# Patient Record
Sex: Male | Born: 1950 | Hispanic: No | Marital: Single | Smoking: Light tobacco smoker
Health system: Southern US, Community
[De-identification: ages and names within clinical notes are randomized; demographics above are authoritative.]

## PROBLEM LIST (undated history)

## (undated) DIAGNOSIS — I255 Ischemic cardiomyopathy: Secondary | ICD-10-CM

## (undated) DIAGNOSIS — I10 Essential (primary) hypertension: Secondary | ICD-10-CM

## (undated) DIAGNOSIS — E119 Type 2 diabetes mellitus without complications: Secondary | ICD-10-CM

## (undated) DIAGNOSIS — E039 Hypothyroidism, unspecified: Secondary | ICD-10-CM

## (undated) DIAGNOSIS — G4733 Obstructive sleep apnea (adult) (pediatric): Principal | ICD-10-CM

## (undated) DIAGNOSIS — F32A Depression, unspecified: Secondary | ICD-10-CM

## (undated) DIAGNOSIS — I2510 Atherosclerotic heart disease of native coronary artery without angina pectoris: Secondary | ICD-10-CM

## (undated) DIAGNOSIS — F329 Major depressive disorder, single episode, unspecified: Secondary | ICD-10-CM

## (undated) HISTORY — DX: Major depressive disorder, single episode, unspecified: F32.9

## (undated) HISTORY — DX: Ischemic cardiomyopathy: I25.5

## (undated) HISTORY — DX: Hypothyroidism, unspecified: E03.9

## (undated) HISTORY — DX: Obstructive sleep apnea (adult) (pediatric): G47.33

## (undated) HISTORY — DX: Depression, unspecified: F32.A

## (undated) HISTORY — DX: Type 2 diabetes mellitus without complications: E11.9

## (undated) HISTORY — PX: OTHER SURGICAL HISTORY: SHX169

## (undated) HISTORY — DX: Essential (primary) hypertension: I10

## (undated) HISTORY — PX: CORONARY STENT PLACEMENT: SHX1402

## (undated) HISTORY — DX: Atherosclerotic heart disease of native coronary artery without angina pectoris: I25.10

---

## 2013-01-25 ENCOUNTER — Ambulatory Visit: Payer: No Typology Code available for payment source | Attending: Family Medicine | Admitting: Internal Medicine

## 2013-01-25 VITALS — BP 149/85 | HR 82 | Temp 99.1°F | Resp 18 | Ht 68.0 in | Wt 204.0 lb

## 2013-01-25 DIAGNOSIS — F3289 Other specified depressive episodes: Secondary | ICD-10-CM | POA: Insufficient documentation

## 2013-01-25 DIAGNOSIS — M545 Low back pain, unspecified: Secondary | ICD-10-CM | POA: Insufficient documentation

## 2013-01-25 DIAGNOSIS — F329 Major depressive disorder, single episode, unspecified: Secondary | ICD-10-CM

## 2013-01-25 DIAGNOSIS — M549 Dorsalgia, unspecified: Secondary | ICD-10-CM | POA: Insufficient documentation

## 2013-01-25 DIAGNOSIS — G8929 Other chronic pain: Secondary | ICD-10-CM | POA: Insufficient documentation

## 2013-01-25 LAB — LIPID PANEL
Cholesterol: 175 mg/dL (ref 0–200)
Total CHOL/HDL Ratio: 5 Ratio
Triglycerides: 232 mg/dL — ABNORMAL HIGH (ref <150)
VLDL: 46 mg/dL — ABNORMAL HIGH (ref 0–40)

## 2013-01-25 LAB — CBC WITH DIFFERENTIAL/PLATELET
Eosinophils Absolute: 0.5 10*3/uL (ref 0.0–0.7)
Eosinophils Relative: 5 % (ref 0–5)
Lymphs Abs: 2 10*3/uL (ref 0.7–4.0)
MCH: 21.8 pg — ABNORMAL LOW (ref 26.0–34.0)
MCV: 64.6 fL — ABNORMAL LOW (ref 78.0–100.0)
Monocytes Relative: 9 % (ref 3–12)
Platelets: 197 10*3/uL (ref 150–400)
RBC: 6.1 MIL/uL — ABNORMAL HIGH (ref 4.22–5.81)

## 2013-01-25 LAB — COMPREHENSIVE METABOLIC PANEL
CO2: 21 mEq/L (ref 19–32)
Creat: 0.93 mg/dL (ref 0.50–1.35)
Glucose, Bld: 201 mg/dL — ABNORMAL HIGH (ref 70–99)
Total Bilirubin: 1.1 mg/dL (ref 0.3–1.2)

## 2013-01-25 MED ORDER — DULOXETINE HCL 30 MG PO CPEP: 30.0000 mg | ORAL_CAPSULE | Freq: Every day | ORAL | Status: DC

## 2013-01-25 MED ORDER — DOCUSATE SODIUM 100 MG PO CAPS: 100.0000 mg | ORAL_CAPSULE | Freq: Two times a day (BID) | ORAL | Status: DC

## 2013-01-25 NOTE — Progress Notes (Signed)
  Subjective:    Patient ID: Nicolas Ray, male    DOB: April 23, 1951, 62 y.o.   MRN: [id number]  HPI 62 year old male who presents to [facility] for establishing care. The patient has not been provided in 10 years. He states that he has been unable to work for the last 6 years because of back pain and depression. The patient has never seen anybody for depression. He denies any chest pain any shortness of breath  Past medical history 1.depression  Past surgical history None  Allergies Peanuts No medication allergies  Family history unknown to the patient Social history smokes 5-6 cigarettes a day of, and is divorced 3 daughters who live in the area    Review of Systems As in history of present illness     Objective:   Physical Exam  General: in No Acute distress  2. Psychological: Alert and Oriented , does not make eye contact 3. Head/ENT: Dry Mucous Membranes  Head Non traumatic, neck supple  Poor Dentition  4. SKIN: decreased Skin turgor, Skin clean Dry and intact no rash  5. Heart: Regular rate and rhythm no Murmur, Rub or gallop  6. Lungs: Clear to auscultation bilaterally, no wheezes or crackles  7. Abdomen: Soft,RLQ tenderness , Non distended  8. Lower extremities: no clubbing, cyanosis, or edema  9. Neurologically Grossly intact, moving all 4 extremities equally  10. MSK: Normal range of motion       Assessment & Plan:  Baseline labs CMP, CBC, hemoglobin A1c, lipid panel  Depression Start the patient on Cymbalta 30 mg a day and follow up in 2 weeks  Chronic back pain the patient has been advised to take Tylenol for this

## 2013-01-25 NOTE — Addendum Note (Signed)
Addended by: Susie Cassette MD, Germain Osgood on: 01/25/2013 10:45 AM   Modules accepted: Orders

## 2013-01-25 NOTE — Patient Instructions (Signed)
Please return in 2 weeks for a followup

## 2013-01-25 NOTE — Progress Notes (Signed)
Patient presents for physical and wants his thyroid to be tested.

## 2013-02-07 ENCOUNTER — Ambulatory Visit: Payer: No Typology Code available for payment source

## 2013-02-08 ENCOUNTER — Encounter: Payer: Self-pay | Admitting: Family Medicine

## 2013-02-08 ENCOUNTER — Ambulatory Visit: Payer: No Typology Code available for payment source | Attending: Family Medicine | Admitting: Family Medicine

## 2013-02-08 VITALS — BP 161/92 | HR 94 | Temp 97.9°F | Ht 68.0 in | Wt 206.0 lb

## 2013-02-08 DIAGNOSIS — E039 Hypothyroidism, unspecified: Secondary | ICD-10-CM

## 2013-02-08 DIAGNOSIS — IMO0001 Reserved for inherently not codable concepts without codable children: Secondary | ICD-10-CM

## 2013-02-08 DIAGNOSIS — I10 Essential (primary) hypertension: Secondary | ICD-10-CM

## 2013-02-08 HISTORY — DX: Essential (primary) hypertension: I10

## 2013-02-08 MED ORDER — LEVOTHYROXINE SODIUM 25 MCG PO TABS: 25.0000 ug | ORAL_TABLET | Freq: Every day | ORAL | Status: DC

## 2013-02-08 MED ORDER — METFORMIN HCL ER 500 MG PO TB24: ORAL_TABLET | ORAL | Status: DC

## 2013-02-08 MED ORDER — LISINOPRIL 5 MG PO TABS: 5.0000 mg | ORAL_TABLET | Freq: Every day | ORAL | Status: DC

## 2013-02-08 NOTE — Progress Notes (Signed)
Patient ID: Nicolas Ray, male   DOB: Nov 23, 1950, 62 y.o.   MRN: [id number]  CC: follow up labs   HPI: Pt presenting today to follow up for labs.  He has reported that he is feeling well. No complaints at this time.  He is requesting referral for eye and dental.    No Known Allergies Past Medical History  Diagnosis Date  . Unspecified essential hypertension 02/08/2013   Current Outpatient Prescriptions on File Prior to Visit  Medication Sig Dispense Refill  . docusate sodium (COLACE) 100 MG capsule Take 1 capsule (100 mg total) by mouth 2 (two) times daily.  10 capsule  0  . DULoxetine (CYMBALTA) 30 MG capsule Take 1 capsule (30 mg total) by mouth daily.  30 capsule  3   No current facility-administered medications on file prior to visit.   Family History  Problem Relation Age of Onset  . Heart disease Mother   . Cancer Father    History   Social History  . Marital Status: Divorced    Spouse Name: N/A    Number of Children: N/A  . Years of Education: N/A   Occupational History  . Not on file.   Social History Main Topics  . Smoking status: Light Tobacco Smoker -- 0.25 packs/day    Types: Cigarettes  . Smokeless tobacco: Not on file  . Alcohol Use: No  . Drug Use: No  . Sexually Active: Not on file   Other Topics Concern  . Not on file   Social History Narrative  . No narrative on file    Review of Systems  Constitutional: Negative for fever, chills, diaphoresis, activity change, appetite change and fatigue.  HENT: Negative for ear pain, nosebleeds, congestion, facial swelling, rhinorrhea, neck pain, neck stiffness and ear discharge.   Eyes: Negative for pain, discharge, redness, itching and visual disturbance.  Respiratory: Negative for cough, choking, chest tightness, shortness of breath, wheezing and stridor.   Cardiovascular: Negative for chest pain, palpitations and leg swelling.  Gastrointestinal: Negative for abdominal distention.  Genitourinary: Negative for  dysuria, urgency, frequency, hematuria, flank pain, decreased urine volume, difficulty urinating and dyspareunia.  Musculoskeletal: Negative for back pain, joint swelling, arthralgias and gait problem.  Neurological: Negative for dizziness, tremors, seizures, syncope, facial asymmetry, speech difficulty, weakness, light-headedness, numbness and headaches.  Hematological: Negative for adenopathy. Does not bruise/bleed easily.  Psychiatric/Behavioral: Negative for hallucinations, behavioral problems, confusion, dysphoric mood, decreased concentration and agitation.    Objective:   Filed Vitals:   02/08/13 1048  BP: 161/92  Pulse: 94  Temp: 97.9 F (36.6 C)    Physical Exam  Constitutional: Appears well-developed and well-nourished. No distress.  HENT: Normocephalic. External right and left ear normal. Oropharynx is clear and moist.  Eyes: Conjunctivae and EOM are normal. PERRLA, no scleral icterus.  Neck: Normal ROM. Neck supple. No JVD. No tracheal deviation. No thyromegaly.  CVS: RRR, S1/S2 +, no murmurs, no gallops, no carotid bruit.  Pulmonary: Effort and breath sounds normal, no stridor, rhonchi, wheezes, rales.  Abdominal: Soft. BS +,  no distension, tenderness, rebound or guarding.  Musculoskeletal: Normal range of motion. No edema and no tenderness.  Lymphadenopathy: No lymphadenopathy noted, cervical, inguinal. Neuro: Alert. Normal reflexes, muscle tone coordination. No cranial nerve deficit. Skin: Skin is warm and dry. No rash noted. Not diaphoretic. No erythema. No pallor.  Psychiatric: Normal mood and affect. Behavior, judgment, thought content normal.   Lab Results  Component Value Date   WBC 8.4 01/25/2013  HGB 13.3 01/25/2013   HCT 39.4 01/25/2013   MCV 64.6* 01/25/2013   PLT 197 01/25/2013   Lab Results  Component Value Date   CREATININE 0.93 01/25/2013   BUN 17 01/25/2013   NA 134* 01/25/2013   K 4.1 01/25/2013   CL 103 01/25/2013   CO2 21 01/25/2013    Lab  Results  Component Value Date   HGBA1C 8.6* 01/25/2013   Lipid Panel     Component Value Date/Time   CHOL 175 01/25/2013 1039   TRIG 232* 01/25/2013 1039   HDL 35* 01/25/2013 1039   CHOLHDL 5.0 01/25/2013 1039   VLDL 46* 01/25/2013 1039   LDLCALC 94 01/25/2013 1039       Assessment and plan:   Patient Active Problem List   Diagnosis Date Noted  . Unspecified hypothyroidism 02/08/2013  . Type II or unspecified type diabetes mellitus without mention of complication, uncontrolled 02/08/2013  . Unspecified essential hypertension 02/08/2013  . Depression 01/25/2013  . Low back pain 01/25/2013   New diagnosis of type 2 diabetes  Refer to diabetes education Strongly advised tobacco cessation Start Glucophage XR 500 mg take 1 po daily with breakfast for 1 week, then titrate to 1 po BIDAC New diagnosis of hypothyroidism Start levothyroxine 25 mcg po daily Start lisinopril 5 mg po daily for blood pressure and renal protection  Follow up in 2 weeks.  Packet given to patient with information about blood glucose monitoring  Pt to go to health department for assistance with medications and assistance with glucose meter and testing supplies.   The patient was given clear instructions to go to ER or return to medical center if symptoms don't improve, worsen or new problems develop.  The patient verbalized understanding.  The patient was told to call to get lab results if they haven't heard anything in the next week.    Rodney Langton, MD, CDE, FAAFP [facility] [place], [place]

## 2013-02-08 NOTE — Patient Instructions (Addendum)
Please scheduled a follow up in 2 weeks.  Smoking Cessation Quitting smoking is important to your health and has many advantages. However, it is not always easy to quit since nicotine is a very addictive drug. Often times, people try 3 times or more before being able to quit. This document explains the best ways for you to prepare to quit smoking. Quitting takes hard work and a lot of effort, but you can do it. ADVANTAGES OF QUITTING SMOKING  You will live longer, feel better, and live better.  Your body will feel the impact of quitting smoking almost immediately.  Within 20 minutes, blood pressure decreases. Your pulse returns to its normal level.  After 8 hours, carbon monoxide levels in the blood return to normal. Your oxygen level increases.  After 24 hours, the chance of having a heart attack starts to decrease. Your breath, hair, and body stop smelling like smoke.  After 48 hours, damaged nerve endings begin to recover. Your sense of taste and smell improve.  After 72 hours, the body is virtually free of nicotine. Your bronchial tubes relax and breathing becomes easier.  After 2 to 12 weeks, lungs can hold more air. Exercise becomes easier and circulation improves.  The risk of having a heart attack, stroke, cancer, or lung disease is greatly reduced.  After 1 year, the risk of coronary heart disease is cut in half.  After 5 years, the risk of stroke falls to the same as a nonsmoker.  After 10 years, the risk of lung cancer is cut in half and the risk of other cancers decreases significantly.  After 15 years, the risk of coronary heart disease drops, usually to the level of a nonsmoker.  If you are pregnant, quitting smoking will improve your chances of having a healthy baby.  The people you live with, especially any children, will be healthier.  You will have extra money to spend on things other than cigarettes. QUESTIONS TO THINK ABOUT BEFORE ATTEMPTING TO QUIT You may  want to talk about your answers with your caregiver.  Why do you want to quit?  If you tried to quit in the past, what helped and what did not?  What will be the most difficult situations for you after you quit? How will you plan to handle them?  Who can help you through the tough times? Your family? Friends? A caregiver?  What pleasures do you get from smoking? What ways can you still get pleasure if you quit? Here are some questions to ask your caregiver:  How can you help me to be successful at quitting?  What medicine do you think would be best for me and how should I take it?  What should I do if I need more help?  What is smoking withdrawal like? How can I get information on withdrawal? GET READY  Set a quit date.  Change your environment by getting rid of all cigarettes, ashtrays, matches, and lighters in your home, car, or work. Do not let people smoke in your home.  Review your past attempts to quit. Think about what worked and what did not. GET SUPPORT AND ENCOURAGEMENT You have a better chance of being successful if you have help. You can get support in many ways.  Tell your family, friends, and co-workers that you are going to quit and need their support. Ask them not to smoke around you.  Get individual, group, or telephone counseling and support. Programs are available at local hospitals  and health centers. Call your local health department for information about programs in your area.  Spiritual beliefs and practices may help some smokers quit.  Download a "quit meter" on your computer to keep track of quit statistics, such as how long you have gone without smoking, cigarettes not smoked, and money saved.  Get a self-help book about quitting smoking and staying off of tobacco. LEARN NEW SKILLS AND BEHAVIORS  Distract yourself from urges to smoke. Talk to someone, go for a walk, or occupy your time with a task.  Change your normal routine. Take a different  route to work. Drink tea instead of coffee. Eat breakfast in a different place.  Reduce your stress. Take a hot bath, exercise, or read a book.  Plan something enjoyable to do every day. Reward yourself for not smoking.  Explore interactive web-based programs that specialize in helping you quit. GET MEDICINE AND USE IT CORRECTLY Medicines can help you stop smoking and decrease the urge to smoke. Combining medicine with the above behavioral methods and support can greatly increase your chances of successfully quitting smoking.  Nicotine replacement therapy helps deliver nicotine to your body without the negative effects and risks of smoking. Nicotine replacement therapy includes nicotine gum, lozenges, inhalers, nasal sprays, and skin patches. Some may be available over-the-counter and others require a prescription.  Antidepressant medicine helps people abstain from smoking, but how this works is unknown. This medicine is available by prescription.  Nicotinic receptor partial agonist medicine simulates the effect of nicotine in your brain. This medicine is available by prescription. Ask your caregiver for advice about which medicines to use and how to use them based on your health history. Your caregiver will tell you what side effects to look out for if you choose to be on a medicine or therapy. Carefully read the information on the package. Do not use any other product containing nicotine while using a nicotine replacement product.  RELAPSE OR DIFFICULT SITUATIONS Most relapses occur within the first 3 months after quitting. Do not be discouraged if you start smoking again. Remember, most people try several times before finally quitting. You may have symptoms of withdrawal because your body is used to nicotine. You may crave cigarettes, be irritable, feel very hungry, cough often, get headaches, or have difficulty concentrating. The withdrawal symptoms are only temporary. They are strongest when  you first quit, but they will go away within 10 14 days. To reduce the chances of relapse, try to:  Avoid drinking alcohol. Drinking lowers your chances of successfully quitting.  Reduce the amount of caffeine you consume. Once you quit smoking, the amount of caffeine in your body increases and can give you symptoms, such as a rapid heartbeat, sweating, and anxiety.  Avoid smokers because they can make you want to smoke.  Do not let weight gain distract you. Many smokers will gain weight when they quit, usually less than 10 pounds. Eat a healthy diet and stay active. You can always lose the weight gained after you quit.  Find ways to improve your mood other than smoking. FOR MORE INFORMATION  www.smokefree.gov  Document Released: 08/12/2001 Document Revised: 02/17/2012 Document Reviewed: 11/27/2011 [facility] Patient Information 2014 [place], [place]. Smoking Cessation, Tips for Success YOU CAN QUIT SMOKING If you are ready to quit smoking, congratulations! You have chosen to help yourself be healthier. Cigarettes bring nicotine, tar, carbon monoxide, and other irritants into your body. Your lungs, heart, and blood vessels will be able to work better  without these poisons. There are many different ways to quit smoking. Nicotine gum, nicotine patches, a nicotine inhaler, or nicotine nasal spray can help with physical craving. Hypnosis, support groups, and medicines help break the habit of smoking. Here are some tips to help you quit for good.  Throw away all cigarettes.  Clean and remove all ashtrays from your home, work, and car.  On a card, write down your reasons for quitting. Carry the card with you and read it when you get the urge to smoke.  Cleanse your body of nicotine. Drink enough water and fluids to keep your urine clear or pale yellow. Do this after quitting to flush the nicotine from your body.  Learn to predict your moods. Do not let a bad situation be your excuse to have a  cigarette. Some situations in your life might tempt you into wanting a cigarette.  Never have "just one" cigarette. It leads to wanting another and another. Remind yourself of your decision to quit.  Change habits associated with smoking. If you smoked while driving or when feeling stressed, try other activities to replace smoking. Stand up when drinking your coffee. Brush your teeth after eating. Sit in a different chair when you read the paper. Avoid alcohol while trying to quit, and try to drink fewer caffeinated beverages. Alcohol and caffeine may urge you to smoke.  Avoid foods and drinks that can trigger a desire to smoke, such as sugary or spicy foods and alcohol.  Ask people who smoke not to smoke around you.  Have something planned to do right after eating or having a cup of coffee. Take a walk or exercise to perk you up. This will help to keep you from overeating.  Try a relaxation exercise to calm you down and decrease your stress. Remember, you may be tense and nervous for the first 2 weeks after you quit, but this will pass.  Find new activities to keep your hands busy. Play with a pen, coin, or rubber band. Doodle or draw things on paper.  Brush your teeth right after eating. This will help cut down on the craving for the taste of tobacco after meals. You can try mouthwash, too.  Use oral substitutes, such as lemon drops, carrots, a cinnamon stick, or chewing gum, in place of cigarettes. Keep them handy so they are available when you have the urge to smoke.  When you have the urge to smoke, try deep breathing.  Designate your home as a nonsmoking area.  If you are a heavy smoker, ask your caregiver about a prescription for nicotine chewing gum. It can ease your withdrawal from nicotine.  Reward yourself. Set aside the cigarette money you save and buy yourself something nice.  Look for support from others. Join a support group or smoking cessation program. Ask someone at home  or at work to help you with your plan to quit smoking.  Always ask yourself, "Do I need this cigarette or is this just a reflex?" Tell yourself, "Today, I choose not to smoke," or "I do not want to smoke." You are reminding yourself of your decision to quit, even if you do smoke a cigarette. HOW WILL I FEEL WHEN I QUIT SMOKING?  The benefits of not smoking start within days of quitting.  You may have symptoms of withdrawal because your body is used to nicotine (the addictive substance in cigarettes). You may crave cigarettes, be irritable, feel very hungry, cough often, get headaches, or have  difficulty concentrating.  The withdrawal symptoms are only temporary. They are strongest when you first quit but will go away within 10 to 14 days.  When withdrawal symptoms occur, stay in control. Think about your reasons for quitting. Remind yourself that these are signs that your body is healing and getting used to being without cigarettes.  Remember that withdrawal symptoms are easier to treat than the major diseases that smoking can cause.  Even after the withdrawal is over, expect periodic urges to smoke. However, these cravings are generally short-lived and will go away whether you smoke or not. Do not smoke!  If you relapse and smoke again, do not lose hope. Most smokers quit 3 times before they are successful.  If you relapse, do not give up! Plan ahead and think about what you will do the next time you get the urge to smoke. LIFE AS A NONSMOKER: MAKE IT FOR A MONTH, MAKE IT FOR LIFE Day 1: Hang this page where you will see it every day. Day 2: Get rid of all ashtrays, matches, and lighters. Day 3: Drink water. Breathe deeply between sips. Day 4: Avoid places with smoke-filled air, such as bars, clubs, or the smoking section of restaurants. Day 5: Keep track of how much money you save by not smoking. Day 6: Avoid boredom. Keep a good book with you or go to the movies. Day 7: Reward yourself!  One week without smoking! Day 8: Make a dental appointment to get your teeth cleaned. Day 9: Decide how you will turn down a cigarette before it is offered to you. Day 10: Review your reasons for quitting. Day 11: Distract yourself. Stay active to keep your mind off smoking and to relieve tension. Take a walk, exercise, read a book, do a crossword puzzle, or try a new hobby. Day 12: Exercise. Get off the bus before your stop or use stairs instead of escalators. Day 13: Call on friends for support and encouragement. Day 14: Reward yourself! Two weeks without smoking! Day 15: Practice deep breathing exercises. Day 16: Bet a friend that you can stay a nonsmoker. Day 17: Ask to sit in nonsmoking sections of restaurants. Day 18: Hang up "No Smoking" signs. Day 19: Think of yourself as a nonsmoker. Day 20: Each morning, tell yourself you will not smoke. Day 21: Reward yourself! Three weeks without smoking! Day 22: Think of smoking in negative ways. Remember how it stains your teeth, gives you bad breath, and leaves you short of breath. Day 23: Eat a nutritious breakfast. Day 24:Do not relive your days as a smoker. Day 25: Hold a pencil in your hand when talking on the telephone. Day 26: Tell all your friends you do not smoke. Day 27: Think about how much better food tastes. Day 28: Remember, one cigarette is one too many. Day 29: Take up a hobby that will keep your hands busy. Day 30: Congratulations! One month without smoking! Give yourself a big reward. Your caregiver can direct you to community resources or hospitals for support, which may include:  Group support.  Education.  Hypnosis.  Subliminal therapy. Document Released: 05/16/2004 Document Revised: 11/10/2011 Document Reviewed: 06/04/2009 [facility] Patient Information 2014 [place], [place]. DASH Diet The DASH diet stands for "Dietary Approaches to Stop Hypertension." It is a healthy eating plan that has been shown to reduce high  blood pressure (hypertension) in as little as 14 days, while also possibly providing other significant health benefits. These other health benefits include reducing the  risk of breast cancer after menopause and reducing the risk of type 2 diabetes, heart disease, colon cancer, and stroke. Health benefits also include weight loss and slowing kidney failure in patients with chronic kidney disease.  DIET GUIDELINES  Limit salt (sodium). Your diet should contain less than 1500 mg of sodium daily.  Limit refined or processed carbohydrates. Your diet should include mostly whole grains. Desserts and added sugars should be used sparingly.  Include small amounts of heart-healthy fats. These types of fats include nuts, oils, and tub margarine. Limit saturated and trans fats. These fats have been shown to be harmful in the body. CHOOSING FOODS  The following food groups are based on a 2000 calorie diet. See your Registered Dietitian for individual calorie needs. Grains and Grain Products (6 to 8 servings daily)  Eat More Often: Whole-wheat bread, brown rice, whole-grain or wheat pasta, quinoa, popcorn without added fat or salt (air popped).  Eat Less Often: White bread, white pasta, white rice, cornbread. Vegetables (4 to 5 servings daily)  Eat More Often: Fresh, frozen, and canned vegetables. Vegetables may be raw, steamed, roasted, or grilled with a minimal amount of fat.  Eat Less Often/Avoid: Creamed or fried vegetables. Vegetables in a cheese sauce. Fruit (4 to 5 servings daily)  Eat More Often: All fresh, canned (in natural juice), or frozen fruits. Dried fruits without added sugar. One hundred percent fruit juice ( cup [237 mL] daily).  Eat Less Often: Dried fruits with added sugar. Canned fruit in light or heavy syrup. [employer], Fish, and Poultry (2 servings or less daily. One serving is 3 to 4 oz [85-114 g]).  Eat More Often: Ninety percent or leaner ground beef, tenderloin, sirloin.  Round cuts of beef, chicken breast, [place] breast. All fish. Grill, bake, or broil your meat. Nothing should be fried.  Eat Less Often/Avoid: Fatty cuts of meat, [place], or chicken leg, thigh, or wing. Fried cuts of meat or fish. Dairy (2 to 3 servings)  Eat More Often: Low-fat or fat-free milk, low-fat plain or light yogurt, reduced-fat or part-skim cheese.  Eat Less Often/Avoid: Milk (whole, 2%).Whole milk yogurt. Full-fat cheeses. Nuts, Seeds, and Legumes (4 to 5 servings per week)  Eat More Often: All without added salt.  Eat Less Often/Avoid: Salted nuts and seeds, canned beans with added salt. Fats and Sweets (limited)  Eat More Often: Vegetable oils, tub margarines without trans fats, sugar-free gelatin. Mayonnaise and salad dressings.  Eat Less Often/Avoid: Coconut oils, palm oils, butter, stick margarine, cream, half and half, cookies, candy, pie. FOR MORE INFORMATION The Dash Diet Eating Plan: www.dashdiet.org Document Released: 08/07/2011 Document Revised: 11/10/2011 Document Reviewed: 08/07/2011 [facility] Patient Information 2014 [place], [place]. Hypothyroidism The thyroid is a large gland located in the lower front of your neck. The thyroid gland helps control metabolism. Metabolism is how your body handles food. It controls metabolism with the hormone thyroxine. When this gland is underactive (hypothyroid), it produces too little hormone.  CAUSES These include:   Absence or destruction of thyroid tissue.  Goiter due to iodine deficiency.  Goiter due to medications.  Congenital defects (since birth).  Problems with the pituitary. This causes a lack of TSH (thyroid stimulating hormone). This hormone tells the thyroid to turn out more hormone. SYMPTOMS  Lethargy (feeling as though you have no energy)  Cold intolerance  Weight gain (in spite of normal food intake)  Dry skin  Coarse hair  Menstrual irregularity (if severe, may lead to infertility)  Slowing  of thought processes Cardiac problems are also caused by insufficient amounts of thyroid hormone. Hypothyroidism in the newborn is cretinism, and is an extreme form. It is important that this form be treated adequately and immediately or it will lead rapidly to retarded physical and mental development. DIAGNOSIS  To prove hypothyroidism, your caregiver may do blood tests and ultrasound tests. Sometimes the signs are hidden. It may be necessary for your caregiver to watch this illness with blood tests either before or after diagnosis and treatment. TREATMENT  Low levels of thyroid hormone are increased by using synthetic thyroid hormone. This is a safe, effective treatment. It usually takes about four weeks to gain the full effects of the medication. After you have the full effect of the medication, it will generally take another four weeks for problems to leave. Your caregiver may start you on low doses. If you have had heart problems the dose may be gradually increased. It is generally not an emergency to get rapidly to normal. HOME CARE INSTRUCTIONS   Take your medications as your caregiver suggests. Let your caregiver know of any medications you are taking or start taking. Your caregiver will help you with dosage schedules.  As your condition improves, your dosage needs may increase. It will be necessary to have continuing blood tests as suggested by your caregiver.  Report all suspected medication side effects to your caregiver. SEEK MEDICAL CARE IF: Seek medical care if you develop:  Sweating.  Tremulousness (tremors).  Anxiety.  Rapid weight loss.  Heat intolerance.  Emotional swings.  Diarrhea.  Weakness. SEEK IMMEDIATE MEDICAL CARE IF:  You develop chest pain, an irregular heart beat (palpitations), or a rapid heart beat. MAKE SURE YOU:   Understand these instructions.  Will watch your condition.  Will get help right away if you are not doing well or get  worse. Document Released: 08/18/2005 Document Revised: 11/10/2011 Document Reviewed: 04/07/2008 [facility] Patient Information 2014 [place], [place]. Diabetes and Periodontal Disease Periodontal disease means disease around a tooth. It is usually a long-standing infection that affects the gums and bones supporting the teeth. Those with poorly controlled diabetes are more likely to have periodontal disease, and infection makes diabetes harder to control. Periodontal (gum) diseases, if left untreated, can lead to tooth loss. Periodontal disease can affect one tooth or many teeth. It begins when the bacteria in plaque, which builds up on your teeth, causes the gums to become inflamed.  Gingivitis is a mild form of the disease in which the gums redden, swell and bleed easily. There is usually little or no discomfort. Gingivitis is often caused by poor tooth, gum and mouth cleaning (oral hygiene). Gingivitis is reversible with professional treatment and good oral hygiene.  CAUSES The main cause of periodontal disease is tooth plaque. This is what is removed from your teeth when they are cleaned. Other factors affecting the health of your gums are:   Diabetes. It makes it harder to fight infections.  Smoking and tobacco.  Genetics. This means you may have inherited this from your parents.  Hormonal changes of puberty and pregnancy.  Stress.  Clenching or grinding your teeth.  Poor nutrition.  Diseases that interfere with the body's protection system (immune system). TREATMENT  Good oral hygiene is the best treatment for periodontal disease.  Floss and brush at least daily.  Good blood glucose (sugar) control.  See your dentist regularly, at least 2 times per year.  Stop smoking.  If regular and improved hygiene does not  improve the condition, you may require a referral to a specialist.  Sometimes, surgery becomes a necessary part of treatment. SEEK MEDICAL CARE IF:   Your gums are  red, swollen or bleed easily.  You are having problems keeping your blood glucose in your goal range.  You develop a fever of more than 100.5 F (38.1 C). Document Released: 08/21/2003 Document Revised: 11/10/2011 Document Reviewed: 01/23/2009 [facility] Patient Information 2014 [place], [place]. A1c, Hemoglobin A1c The A1c (hemoglobin A1c, glycosylated hemoglobin) test checks the average amount of sugar (glucose) in the blood over the last 2 to 3 months. It does this by measuring the concentration of glycosylated hemoglobin. As glucose circulates in the blood, some of it binds to hemoglobin A. This is the main form of hemoglobin in adults. Hemoglobin is a red protein that carries oxygen in the red blood cells (RBCs). Once the glucose is bound to the hemoglobin A, it remains there for the life of the red blood cell (about 120 days). This combination of glucose and hemoglobin A is called A1c. Increased glucose in the blood, increases the hemoglobin A1c. A1c levels do not change quickly but will shift as RBCs are replaced. A1c is a valuable test because it enables you to know how your glucose has been controlled over the past 3 months.  5% A1c  Estimated Average Glucose mg/dL: 97  6% Z6X  Estimated Average Glucose mg/dL: 096  7% E4V  Estimated Average Glucose mg/dL: 409  8% W1X  Estimated Average Glucose mg/dL: 914  9% N8G  Estimated Average Glucose mg/dL: 956  21% H0Q  Estimated Average Glucose mg/dL: 657  84% O9G  Estimated Average Glucose mg/dL: 295  28% U1L  Estimated Average Glucose mg/dL: 244 The American Diabetes Association (ADA) recommends testing your A1c level 4 times each year if you have type 1 or type 2 diabetes and use insulin; or 2 times each year if you have type 2 diabetes and do not use insulin. When someone is first diagnosed with diabetes or if control is not good, A1c may be ordered more frequently. PREPARATION FOR TEST No preparation or fasting is  necessary for this blood sample. NORMAL FINDINGS   Adults without diabetes: 2.2 to 4.8%  Children without diabetes: 1.8 to 4.0%  Good diabetic control: 2.5 to 5.9%  Fair diabetic control: 6 to 8%  Poor diabetic control: greater than 8% The values of A1c may be falsely low in pregnancy, in disorders with shortened red blood cell lives, or in sickle cell disease or trait (carrier). The values may be falsely high in disorders with longer red cell lives, or in people with Thallassemia, kidney failure, or iron deficiency anemia. Ranges for normal findings may vary among different laboratories and hospitals. You should always check with your doctor after having lab work or other tests done to discuss the meaning of your test results and whether your values are considered within normal limits. MEANING OF TEST  Your caregiver will go over the test results with you and discuss the importance and meaning of your results, as well as treatment options and the need for additional tests if necessary. If your A1c is greater than 7%, discuss treatment options with your caregiver. OBTAINING THE TEST RESULTS  It is your responsibility to obtain your test results. Ask the lab or department performing the test when and how you will get your results. Document Released: 09/09/2004 Document Revised: 11/10/2011 Document Reviewed: 07/15/2012 [facility] Patient Information 2014 [place], [place]. 1800 Calorie Diet  for Diabetes Meal Planning The 1800 calorie diet is designed for eating up to 1800 calories each day. Following this diet and making healthy meal choices can help improve overall health. This diet controls blood sugar (glucose) levels and can also help lower blood pressure and cholesterol. SERVING SIZES Measuring foods and serving sizes helps to make sure you are getting the right amount of food. The list below tells how big or small some common serving sizes are:  1 oz.........4 stacked dice.  3  oz........Marland KitchenDeck of cards.  1 tsp.......Marland KitchenTip of little finger.  1 tbs......Marland KitchenMarland KitchenThumb.  2 tbs.......Marland KitchenGolf ball.   cup......Marland KitchenHalf of a fist.  1 cup.......Marland KitchenA fist. GUIDELINES FOR CHOOSING FOODS The goal of this diet is to eat a variety of foods and limit calories to 1800 each day. This can be done by choosing foods that are low in calories and fat. The diet also suggests eating small amounts of food frequently. Doing this helps control your blood glucose levels so they do not get too high or too low. Each meal or snack may include a protein food source to help you feel more satisfied and to stabilize your blood glucose. Try to eat about the same amount of food around the same time each day. This includes weekend days, travel days, and days off work. Space your meals about 4 to 5 hours apart and add a snack between them if you wish.  For example, a daily food plan could include breakfast, a morning snack, lunch, dinner, and an evening snack. Healthy meals and snacks include whole grains, vegetables, fruits, lean meats, poultry, fish, and dairy products. As you plan your meals, select a variety of foods. Choose from the bread and starch, vegetable, fruit, dairy, and meat/protein groups. Examples of foods from each group and their suggested serving sizes are listed below. Use measuring cups and spoons to become familiar with what a healthy portion looks like. Bread and Starch Each serving equals 15 grams of carbohydrates.  1 slice bread.   bagel.   cup cold cereal (unsweetened).   cup hot cereal or mashed potatoes.  1 small potato (size of a computer mouse).   cup cooked pasta or rice.   English muffin.  1 cup broth-based soup.  3 cups of popcorn.  4 to 6 whole-wheat crackers.   cup cooked beans, peas, or corn. Vegetable Each serving equals 5 grams of carbohydrates.   cup cooked vegetables.  1 cup raw vegetables.   cup tomato or vegetable juice. Fruit Each serving equals  15 grams of carbohydrates.  1 small apple or orange.  1 cup watermelon or strawberries.   cup applesauce (no sugar added).  2 tbs raisins.   banana.   cup canned fruit, packed in water, its own juice, or sweetened with a sugar substitute.   cup unsweetened fruit juice. Dairy Each serving equals 12 to 15 grams of carbohydrates.  1 cup fat-free milk.  6 oz artificially sweetened yogurt or plain yogurt.  1 cup low-fat buttermilk.  1 cup soy milk.  1 cup almond milk. Meat/Protein  1 large egg.  2 to 3 oz meat, poultry, or fish.   cup low-fat cottage cheese.  1 tbs peanut butter.  1 oz low-fat cheese.   cup tuna in water.   cup tofu. Fat  1 tsp oil.  1 tsp trans-fat-free margarine.  1 tsp butter.  1 tsp mayonnaise.  2 tbs avocado.  1 tbs salad dressing.  1 tbs cream cheese.  2  tbs sour cream. SAMPLE 1800 CALORIE DIET PLAN Breakfast   cup unsweetened cereal (1 carb serving).  1 cup fat-free milk (1 carb serving).  1 slice whole-wheat toast (1 carb serving).   small banana (1 carb serving).  1 scrambled egg.  1 tsp trans-fat-free margarine. Lunch  Tuna sandwich.  2 slices whole-wheat bread (2 carb servings).   cup canned tuna in water, drained.  1 tbs reduced fat mayonnaise.  1 stalk celery, chopped.  2 slices tomato.  1 lettuce leaf.  1 cup carrot sticks.  24 to 30 seedless grapes (2 carb servings).  6 oz light yogurt (1 carb serving). Afternoon Snack  3 graham cracker squares (1 carb serving).  Fat-free milk, 1 cup (1 carb serving).  1 tbs peanut butter. Dinner  3 oz salmon, broiled with 1 tsp oil.  1 cup mashed potatoes (2 carb servings) with 1 tsp trans-fat-free margarine.  1 cup fresh or frozen green beans.  1 cup steamed asparagus.  1 cup fat-free milk (1 carb serving). Evening Snack  3 cups air-popped popcorn (1 carb serving).  2 tbs parmesan cheese sprinkled on top. MEAL PLAN Use this  worksheet to help you make a daily meal plan based on the 1800 calorie diet suggestions. If you are using this plan to help you control your blood glucose, you may interchange carbohydrate-containing foods (dairy, starches, and fruits). Select a variety of fresh foods of varying colors and flavors. The total amount of carbohydrate in your meals or snacks is more important than making sure you include all of the food groups every time you eat. Choose from the following foods to build your day's meals:  8 Starches.  4 Vegetables.  3 Fruits.  2 Dairy.  6 to 7 oz Meat/Protein.  Up to 4 Fats. Your dietician can use this worksheet to help you decide how many servings and which types of foods are right for you. BREAKFAST Food Group and Servings / Food Choice Starch ________________________________________________________ Dairy _________________________________________________________ Fruit _________________________________________________________ Meat/Protein __________________________________________________ Fat ___________________________________________________________ LUNCH Food Group and Servings / Food Choice Starch ________________________________________________________ Meat/Protein __________________________________________________ Vegetable _____________________________________________________ Fruit _________________________________________________________ Dairy _________________________________________________________ Fat ___________________________________________________________ Aura Fey Food Group and Servings / Food Choice Starch ________________________________________________________ Meat/Protein __________________________________________________ Fruit __________________________________________________________ Dairy _________________________________________________________ Laural Golden Food Group and Servings / Food Choice Starch  _________________________________________________________ Meat/Protein ___________________________________________________ Dairy __________________________________________________________ Vegetable ______________________________________________________ Fruit ___________________________________________________________ Fat ____________________________________________________________ Lollie Sails Food Group and Servings / Food Choice Fruit __________________________________________________________ Meat/Protein ___________________________________________________ Dairy __________________________________________________________ Starch _________________________________________________________ DAILY TOTALS Starch ____________________________ Vegetable _________________________ Fruit _____________________________ Dairy _____________________________ Meat/Protein______________________ Fat _______________________________ Document Released: 03/10/2005 Document Revised: 11/10/2011 Document Reviewed: 07/04/2011 [employer] Patient Information 2014 [employer]. Diabetes and Foot Care Diabetes may cause you to have a poor blood supply (circulation) to your legs and feet. Because of this, the skin may be thinner, break easier, and heal more slowly. You also may have nerve damage in your legs and feet causing decreased feeling. You may not notice minor injuries to your feet that could lead to serious problems or infections. Taking care of your feet is one of the most important things you can do for yourself.  HOME CARE INSTRUCTIONS  Do not go barefoot. Bare feet are easily injured.  Check your feet daily for blisters, cuts, and redness.  Wash your feet with warm water (not hot) and mild soap. Pat your feet and between your toes until completely dry.  Apply a moisturizing lotion that does not contain alcohol or petroleum jelly to the dry skin on your feet  and to dry brittle toenails. Do not put it  between your toes.  Trim your toenails straight across. Do not dig under them or around the cuticle.  Do not cut corns or calluses, or try to remove them with medicine.  Wear clean cotton socks or stockings every day. Make sure they are not too tight. Do not wear knee high stockings since they may decrease blood flow to your legs.  Wear leather shoes that fit properly and have enough cushioning. To break in new shoes, wear them just a few hours a day to avoid injuring your feet.  Wear shoes at all times, even in the house.  Do not cross your legs. This may decrease the blood flow to your feet.  If you find a minor scrape, cut, or break in the skin on your feet, keep it and the skin around it clean and dry. These areas may be cleansed with mild soap and water. Do not use peroxide, alcohol, iodine or Merthiolate.  When you remove an adhesive bandage, be sure not to harm the skin around it.  If you have a wound, look at it several times a day to make sure it is healing.  Do not use heating pads or hot water bottles. Burns can occur. If you have lost feeling in your feet or legs, you may not know it is happening until it is too late.  Report any cuts, sores or bruises to your caregiver. Do not wait! SEEK MEDICAL CARE IF:   You have an injury that is not healing or you notice redness, numbness, burning, or tingling.  Your feet always feel cold.  You have pain or cramps in your legs and feet. SEEK IMMEDIATE MEDICAL CARE IF:   There is increasing redness, swelling, or increasing pain in the wound.  There is a red line that goes up your leg.  Pus is coming from a wound.  You develop an unexplained oral temperature above 102 F (38.9 C), or as your caregiver suggests.  You notice a bad smell coming from an ulcer or wound. MAKE SURE YOU:   Understand these instructions.  Will watch your condition.  Will get help right away if you are not doing well or get worse. Document  Released: 08/15/2000 Document Revised: 11/10/2011 Document Reviewed: 02/21/2009 [facility] Patient Information 2014 [place], [place].

## 2013-03-11 ENCOUNTER — Emergency Department (HOSPITAL_COMMUNITY)
Admission: EM | Admit: 2013-03-11 | Discharge: 2013-03-11 | Disposition: A | Discharge status: Home / Self Care | Payer: No Typology Code available for payment source | Attending: Emergency Medicine | Admitting: Emergency Medicine

## 2013-03-11 ENCOUNTER — Encounter (HOSPITAL_COMMUNITY): Payer: Self-pay | Admitting: Emergency Medicine

## 2013-03-11 DIAGNOSIS — F17200 Nicotine dependence, unspecified, uncomplicated: Secondary | ICD-10-CM | POA: Insufficient documentation

## 2013-03-11 DIAGNOSIS — M545 Low back pain, unspecified: Secondary | ICD-10-CM | POA: Insufficient documentation

## 2013-03-11 DIAGNOSIS — M5432 Sciatica, left side: Secondary | ICD-10-CM

## 2013-03-11 DIAGNOSIS — M5430 Sciatica, unspecified side: Secondary | ICD-10-CM | POA: Insufficient documentation

## 2013-03-11 DIAGNOSIS — IMO0001 Reserved for inherently not codable concepts without codable children: Secondary | ICD-10-CM | POA: Insufficient documentation

## 2013-03-11 DIAGNOSIS — Z79899 Other long term (current) drug therapy: Secondary | ICD-10-CM | POA: Insufficient documentation

## 2013-03-11 DIAGNOSIS — I10 Essential (primary) hypertension: Secondary | ICD-10-CM | POA: Insufficient documentation

## 2013-03-11 MED ORDER — DIAZEPAM 5 MG PO TABS
10.0000 mg | ORAL_TABLET | Freq: Once | ORAL | Status: AC
  Administered 2013-03-11: 10 mg via ORAL
  Filled 2013-03-11: qty 2

## 2013-03-11 MED ORDER — DIAZEPAM 5 MG PO TABS: 5.0000 mg | ORAL_TABLET | Freq: Three times a day (TID) | ORAL | Status: DC | PRN

## 2013-03-11 MED ORDER — IBUPROFEN 600 MG PO TABS: 600.0000 mg | ORAL_TABLET | Freq: Four times a day (QID) | ORAL | Status: DC | PRN

## 2013-03-11 MED ORDER — IBUPROFEN 200 MG PO TABS
600.0000 mg | ORAL_TABLET | Freq: Once | ORAL | Status: AC
  Administered 2013-03-11: 600 mg via ORAL
  Filled 2013-03-11: qty 3

## 2013-03-11 NOTE — ED Notes (Signed)
Patient acknowledges that he is suppose to being taking metformin, lisinopril and levothyroxin. Has received prescriptions from his PCP but chose not to get them filled because he felt fine.

## 2013-03-11 NOTE — ED Notes (Signed)
PT reports intermittent L flank pain for past 3 days.  Denies any recent injuries or urinary changes.

## 2013-03-11 NOTE — ED Provider Notes (Signed)
History    CSN: [id number] Arrival date & time 03/11/13  5621  First MD Initiated Contact with Patient 03/11/13 [phone]     Chief Complaint  Patient presents with  . Flank Pain   (Consider location/radiation/quality/duration/timing/severity/associated sxs/prior Treatment) HPI Pt is a 62yo male presenting with left sided LPB and lateral thigh pain that has been intermittent for past 3 days, described as achy, 10/10 at worst, waxes and wanes.  Pain started in right lower back and radiates down left lateral thigh just above his knee.  States he was helping his sister with gardening 3 days ago but does not recall any specific injury.  Pt denies trying anything for pain PTA but states he could not sleep last night due to pain.  Denies previous hx of similar pain or LBP.  Denies recent injury or fall. Denies any numbness or tingling in legs.  Denies any urinary symptoms.   Past Medical History  Diagnosis Date  . Unspecified essential hypertension 02/08/2013   History reviewed. No pertinent past surgical history. Family History  Problem Relation Age of Onset  . Heart disease Mother   . Cancer Father    History  Substance Use Topics  . Smoking status: Light Tobacco Smoker -- 0.25 packs/day    Types: Cigarettes  . Smokeless tobacco: Not on file  . Alcohol Use: No    Review of Systems  Constitutional: Negative for fever and chills.  Gastrointestinal: Negative for nausea and abdominal pain.  Genitourinary: Negative for dysuria and flank pain.  Musculoskeletal: Positive for myalgias and back pain.  Skin: Negative for rash.  All other systems reviewed and are negative.    Allergies  Review of patient's allergies indicates no known allergies.  Home Medications   Current Outpatient Rx  Name  Route  Sig  Dispense  Refill  . diazepam (VALIUM) 5 MG tablet   Oral   Take 1 tablet (5 mg total) by mouth every 8 (eight) hours as needed (Take 1-2 pills every 8 hours as needed for pain).  10 tablet   0   . docusate sodium (COLACE) 100 MG capsule   Oral   Take 1 capsule (100 mg total) by mouth 2 (two) times daily.   10 capsule   0   . DULoxetine (CYMBALTA) 30 MG capsule   Oral   Take 1 capsule (30 mg total) by mouth daily.   30 capsule   3   . ibuprofen (ADVIL,MOTRIN) 600 MG tablet   Oral   Take 1 tablet (600 mg total) by mouth every 6 (six) hours as needed for pain.   30 tablet   0   . levothyroxine (LEVOTHROID) 25 MCG tablet   Oral   Take 1 tablet (25 mcg total) by mouth daily before breakfast.   30 tablet   2   . lisinopril (PRINIVIL,ZESTRIL) 5 MG tablet   Oral   Take 1 tablet (5 mg total) by mouth daily.   30 tablet   3   . metFORMIN (GLUCOPHAGE XR) 500 MG 24 hr tablet      Take 1 po daily with breakfast for 1 week, then increase to 1 tablet with breakfast and 1 tablet with supper   60 tablet   3    BP 184/102  Pulse 88  Temp(Src) 97.6 F (36.4 C) (Oral)  Resp 18  Ht 5\' 8"  (1.727 m)  Wt 205 lb (92.987 kg)  BMI 31.18 kg/m2  SpO2 98% Physical Exam  Nursing note  and vitals reviewed. Constitutional: He appears well-developed and well-nourished. No distress.  HENT:  Head: Normocephalic and atraumatic.  Eyes: Conjunctivae are normal. No scleral icterus.  Neck: Normal range of motion.  Cardiovascular: Normal rate, regular rhythm and normal heart sounds.   Pulmonary/Chest: Effort normal and breath sounds normal. No respiratory distress. He has no wheezes. He has no rales. He exhibits no tenderness.  Abdominal: Soft. Bowel sounds are normal. He exhibits no distension and no mass. There is no tenderness. There is no rebound and no guarding.  Abd soft, nl bowel sounds. NDNT  Musculoskeletal: Normal range of motion. He exhibits no edema and no tenderness.       Back:  No midline bony spinal tenderness, step offs or crepitus. Pt able to ambulate w/o difficulty. No rashes, warmth, erythema, ecchymosis, edema, deformity.  Distal pulses 2+    Neurological: He is alert.  Skin: Skin is warm and dry. No rash noted. He is not diaphoretic. No erythema. No pallor.    ED Course  Procedures (including critical care time) Labs Reviewed - No data to display No results found. 1. Sciatica of left side     MDM  Pt's pain is classic for sciatic type pain.  Pt denies any recent fall or previous back pain or injury.  Do not believe imaging is needed at this time.  Rx: valium and ibuprofen.  Will discharge pt home and have her f/u with [facility] info provided. Return precautions given. Pt verbalized understanding and agreement with tx plan. Vitals: unremarkable. Discharged in stable condition.    Discussed pt with attending during ED encounter.   Junius Finner, PA-C 03/11/13 [phone]

## 2013-03-11 NOTE — Progress Notes (Signed)
[facility] CL seen patient. Pt has the [employer]. Patient pcp is currently [facility].

## 2013-03-11 NOTE — ED Provider Notes (Signed)
Medical screening examination/treatment/procedure(s) were performed by non-physician practitioner and as supervising physician I was immediately available for consultation/collaboration.   Gavin Pound. Oletta Lamas, MD 03/11/13 1610

## 2013-03-15 ENCOUNTER — Ambulatory Visit: Payer: No Typology Code available for payment source | Attending: Family Medicine | Admitting: Family Medicine

## 2013-03-15 VITALS — BP 157/98 | HR 82 | Temp 97.9°F | Resp 18 | Ht 67.0 in | Wt 204.8 lb

## 2013-03-15 DIAGNOSIS — M5430 Sciatica, unspecified side: Secondary | ICD-10-CM

## 2013-03-15 DIAGNOSIS — M5432 Sciatica, left side: Secondary | ICD-10-CM

## 2013-03-15 DIAGNOSIS — M545 Low back pain: Secondary | ICD-10-CM

## 2013-03-15 DIAGNOSIS — E039 Hypothyroidism, unspecified: Secondary | ICD-10-CM

## 2013-03-15 DIAGNOSIS — I10 Essential (primary) hypertension: Secondary | ICD-10-CM

## 2013-03-15 MED ORDER — HYDROCODONE-ACETAMINOPHEN 5-325 MG PO TABS: 1.0000 | ORAL_TABLET | Freq: Every evening | ORAL | Status: DC | PRN

## 2013-03-15 NOTE — Progress Notes (Signed)
Patient ID: Nicolas Ray, male   DOB: 05/05/1951, 62 y.o.   MRN: [id number]  WU:[id number]  HPI: The patient reports that he returned from a long plane trip where he was sitting for several hours.  He reports that he developed pain in the left leg.  He went to the emergency department and was diagnosed with sciatica.  He reports that he was given a muscle relaxer and some anti-inflammatory medication.  He reports that he had mild relief but continues to have pain in the evenings prior to going to bed.  The pain starts from the lower back and shoots down into the left leg to the knee.  The patient also reports that he stopped taking his levothyroxine and after one dose.  He reports that it made him too tired.  He reports that he only took one half tablet.  The patient also reports that he stopped taking his metformin medication but did not give a reason.  He also only recently started taking his blood pressure medication several days ago.  No Known Allergies Past Medical History  Diagnosis Date  . Unspecified essential hypertension 02/08/2013  . Thyroid disease   . Depression   . Diabetes mellitus without complication    Current Outpatient Prescriptions on File Prior to Visit  Medication Sig Dispense Refill  . diazepam (VALIUM) 5 MG tablet Take 1 tablet (5 mg total) by mouth every 8 (eight) hours as needed (Take 1-2 pills every 8 hours as needed for pain).  10 tablet  0  . docusate sodium (COLACE) 100 MG capsule Take 1 capsule (100 mg total) by mouth 2 (two) times daily.  10 capsule  0  . DULoxetine (CYMBALTA) 30 MG capsule Take 1 capsule (30 mg total) by mouth daily.  30 capsule  3  . ibuprofen (ADVIL,MOTRIN) 600 MG tablet Take 1 tablet (600 mg total) by mouth every 6 (six) hours as needed for pain.  30 tablet  0  . levothyroxine (LEVOTHROID) 25 MCG tablet Take 1 tablet (25 mcg total) by mouth daily before breakfast.  30 tablet  2  . lisinopril (PRINIVIL,ZESTRIL) 5 MG tablet Take 1 tablet (5 mg total)  by mouth daily.  30 tablet  3  . metFORMIN (GLUCOPHAGE XR) 500 MG 24 hr tablet Take 1 po daily with breakfast for 1 week, then increase to 1 tablet with breakfast and 1 tablet with supper  60 tablet  3   No current facility-administered medications on file prior to visit.   Family History  Problem Relation Age of Onset  . Heart disease Mother   . Cancer Father    History   Social History  . Marital Status: Divorced    Spouse Name: N/A    Number of Children: N/A  . Years of Education: N/A   Occupational History  . Not on file.   Social History Main Topics  . Smoking status: Light Tobacco Smoker -- 0.25 packs/day    Types: Cigarettes  . Smokeless tobacco: Not on file  . Alcohol Use: No  . Drug Use: No  . Sexually Active: Not on file   Other Topics Concern  . Not on file   Social History Narrative  . No narrative on file    Review of Systems  Constitutional: Negative for fever, chills, diaphoresis, activity change, appetite change and fatigue.  HENT: Negative for ear pain, nosebleeds, congestion, facial swelling, rhinorrhea, neck pain, neck stiffness and ear discharge.   Eyes: Negative for pain, discharge, redness,  itching and visual disturbance.  Respiratory: Negative for cough, choking, chest tightness, shortness of breath, wheezing and stridor.   Cardiovascular: Negative for chest pain, palpitations and leg swelling.  Gastrointestinal: Negative for abdominal distention.  Genitourinary: Negative for dysuria, urgency, frequency, hematuria, flank pain, decreased urine volume, difficulty urinating and dyspareunia.  Musculoskeletal: Negative for back pain, joint swelling, arthralgias and gait problem.  Neurological: Negative for dizziness, tremors, seizures, syncope, facial asymmetry, speech difficulty, weakness, light-headedness, numbness and headaches.  Hematological: Negative for adenopathy. Does not bruise/bleed easily.  Psychiatric/Behavioral: Negative for  hallucinations, behavioral problems, confusion, dysphoric mood, decreased concentration and agitation.    Objective:   Filed Vitals:   03/15/13 1306  BP: 157/98  Pulse: 82  Temp: 97.9 F (36.6 C)  Resp: 18    Physical Exam  Constitutional: Appears well-developed and well-nourished. No distress.  HENT: Normocephalic. External right and left ear normal. Oropharynx is clear and moist.  Eyes: Conjunctivae and EOM are normal. PERRLA, no scleral icterus.  Neck: Normal ROM. Neck supple. No JVD. No tracheal deviation. No thyromegaly.  CVS: RRR, S1/S2 +, no murmurs, no gallops, no carotid bruit.  Pulmonary: Effort and breath sounds normal, no stridor, rhonchi, wheezes, rales.  Abdominal: Soft. BS +,  no distension, tenderness, rebound or guarding.  Musculoskeletal:Tenderness along the lumbar spine with radiation into the left leg.  Spasm of the paraspinal muscles in the lumbar spine. Lymphadenopathy: No lymphadenopathy noted, cervical, inguinal. Neuro: Alert. Normal reflexes, muscle tone coordination. No cranial nerve deficit. Skin: Skin is warm and dry. No rash noted. Not diaphoretic. No erythema. No pallor.  Psychiatric: Normal mood and affect. Behavior, judgment, thought content normal.   Lab Results  Component Value Date   WBC 8.4 01/25/2013   HGB 13.3 01/25/2013   HCT 39.4 01/25/2013   MCV 64.6* 01/25/2013   PLT 197 01/25/2013   Lab Results  Component Value Date   CREATININE 0.93 01/25/2013   BUN 17 01/25/2013   NA 134* 01/25/2013   K 4.1 01/25/2013   CL 103 01/25/2013   CO2 21 01/25/2013    Lab Results  Component Value Date   HGBA1C 8.6* 01/25/2013   Lipid Panel     Component Value Date/Time   CHOL 175 01/25/2013 1039   TRIG 232* 01/25/2013 1039   HDL 35* 01/25/2013 1039   CHOLHDL 5.0 01/25/2013 1039   VLDL 46* 01/25/2013 1039   LDLCALC 94 01/25/2013 1039       Assessment and plan:   Patient Active Problem List   Diagnosis Date Noted  . Sciatica 03/15/2013  . Unspecified  hypothyroidism 02/08/2013  . Type II or unspecified type diabetes mellitus without mention of complication, uncontrolled 02/08/2013  . Unspecified essential hypertension 02/08/2013  . Depression 01/25/2013  . Low back pain 01/25/2013   Noncompliance-had a long conversation with the patient today regarding the importance of taking his medications as prescribed.  I explained to him what the medications were for.  The patient verbalized understanding.  I also on related the information to his wife.  He said that he would try to take his medications as prescribed.  We'll need to maintain followup with him on regular basis to be sure that he is actually taking the medication. Encouraged pt to please take his synthroid medication.    Sciatica - left Vicodin 5 - take 1 po QHS prn severe pain in the left leg. Low back strengthening exercises provided  Follow up 3 months  The patient was given clear instructions  to go to ER or return to medical center if symptoms don't improve, worsen or new problems develop.  The patient verbalized understanding.  The patient was told to call to get any lab results if not heard anything in the next week.    Rodney Langton, MD, CDE, FAAFP [facility] [place], [place]

## 2013-03-15 NOTE — Progress Notes (Signed)
03/15/13 Present for follow up left leg pain. P.Williams,RN BSN [facility]

## 2013-03-15 NOTE — Patient Instructions (Addendum)
Sciatica Sciatica is pain, weakness, numbness, or tingling along the path of the sciatic nerve. The nerve starts in the lower back and runs down the back of each leg. The nerve controls the muscles in the lower leg and in the back of the knee, while also providing sensation to the back of the thigh, lower leg, and the sole of your foot. Sciatica is a symptom of another medical condition. For instance, nerve damage or certain conditions, such as a herniated disk or bone spur on the spine, pinch or put pressure on the sciatic nerve. This causes the pain, weakness, or other sensations normally associated with sciatica. Generally, sciatica only affects one side of the body. CAUSES   Herniated or slipped disc.  Degenerative disk disease.  A pain disorder involving the narrow muscle in the buttocks (piriformis syndrome).  Pelvic injury or fracture.  Pregnancy.  Tumor (rare). SYMPTOMS  Symptoms can vary from mild to very severe. The symptoms usually travel from the low back to the buttocks and down the back of the leg. Symptoms can include:  Mild tingling or dull aches in the lower back, leg, or hip.  Numbness in the back of the calf or sole of the foot.  Burning sensations in the lower back, leg, or hip.  Sharp pains in the lower back, leg, or hip.  Leg weakness.  Severe back pain inhibiting movement. These symptoms may get worse with coughing, sneezing, laughing, or prolonged sitting or standing. Also, being overweight may worsen symptoms. DIAGNOSIS  Your caregiver will perform a physical exam to look for common symptoms of sciatica. He or she may ask you to do certain movements or activities that would trigger sciatic nerve pain. Other tests may be performed to find the cause of the sciatica. These may include:  Blood tests.  X-rays.  Imaging tests, such as an MRI or CT scan. TREATMENT  Treatment is directed at the cause of the sciatic pain. Sometimes, treatment is not necessary  and the pain and discomfort goes away on its own. If treatment is needed, your caregiver may suggest:  Over-the-counter medicines to relieve pain.  Prescription medicines, such as anti-inflammatory medicine, muscle relaxants, or narcotics.  Applying heat or ice to the painful area.  Steroid injections to lessen pain, irritation, and inflammation around the nerve.  Reducing activity during periods of pain.  Exercising and stretching to strengthen your abdomen and improve flexibility of your spine. Your caregiver may suggest losing weight if the extra weight makes the back pain worse.  Physical therapy.  Surgery to eliminate what is pressing or pinching the nerve, such as a bone spur or part of a herniated disk. HOME CARE INSTRUCTIONS   Only take over-the-counter or prescription medicines for pain or discomfort as directed by your caregiver.  Apply ice to the affected area for 20 minutes, 3 4 times a day for the first 48 72 hours. Then try heat in the same way.  Exercise, stretch, or perform your usual activities if these do not aggravate your pain.  Attend physical therapy sessions as directed by your caregiver.  Keep all follow-up appointments as directed by your caregiver.  Do not wear high heels or shoes that do not provide proper support.  Check your mattress to see if it is too soft. A firm mattress may lessen your pain and discomfort. SEEK IMMEDIATE MEDICAL CARE IF:   You lose control of your bowel or bladder (incontinence).  You have increasing weakness in the lower back,   pelvis, buttocks, or legs.  You have redness or swelling of your back.  You have a burning sensation when you urinate.  You have pain that gets worse when you lie down or awakens you at night.  Your pain is worse than you have experienced in the past.  Your pain is lasting longer than 4 weeks.  You are suddenly losing weight without reason. MAKE SURE YOU:  Understand these  instructions.  Will watch your condition.  Will get help right away if you are not doing well or get worse. Document Released: 08/12/2001 Document Revised: 02/17/2012 Document Reviewed: 12/28/2011 [facility] Patient Information 2014 [place], [place].   Hypertension As your heart beats, it forces blood through your arteries. This force is your blood pressure. If the pressure is too high, it is called hypertension (HTN) or high blood pressure. HTN is dangerous because you may have it and not know it. High blood pressure may mean that your heart has to work harder to pump blood. Your arteries may be narrow or stiff. The extra work puts you at risk for heart disease, stroke, and other problems.  Blood pressure consists of two numbers, a higher number over a lower, 110/72, for example. It is stated as "110 over 72." The ideal is below 120 for the top number (systolic) and under 80 for the bottom (diastolic). Write down your blood pressure today. You should pay close attention to your blood pressure if you have certain conditions such as:  Heart failure.  Prior heart attack.  Diabetes  Chronic kidney disease.  Prior stroke.  Multiple risk factors for heart disease. To see if you have HTN, your blood pressure should be measured while you are seated with your arm held at the level of the heart. It should be measured at least twice. A one-time elevated blood pressure reading (especially in the Emergency Department) does not mean that you need treatment. There may be conditions in which the blood pressure is different between your right and left arms. It is important to see your caregiver soon for a recheck. Most people have essential hypertension which means that there is not a specific cause. This type of high blood pressure may be lowered by changing lifestyle factors such as:  Stress.  Smoking.  Lack of exercise.  Excessive weight.  Drug/tobacco/alcohol use.  Eating less salt. Most  people do not have symptoms from high blood pressure until it has caused damage to the body. Effective treatment can often prevent, delay or reduce that damage. TREATMENT  When a cause has been identified, treatment for high blood pressure is directed at the cause. There are a large number of medications to treat HTN. These fall into several categories, and your caregiver will help you select the medicines that are best for you. Medications may have side effects. You should review side effects with your caregiver. If your blood pressure stays high after you have made lifestyle changes or started on medicines,   Your medication(s) may need to be changed.  Other problems may need to be addressed.  Be certain you understand your prescriptions, and know how and when to take your medicine.  Be sure to follow up with your caregiver within the time frame advised (usually within two weeks) to have your blood pressure rechecked and to review your medications.  If you are taking more than one medicine to lower your blood pressure, make sure you know how and at what times they should be taken. Taking two medicines  at the same time can result in blood pressure that is too low. SEEK IMMEDIATE MEDICAL CARE IF:  You develop a severe headache, blurred or changing vision, or confusion.  You have unusual weakness or numbness, or a faint feeling.  You have severe chest or abdominal pain, vomiting, or breathing problems. MAKE SURE YOU:   Understand these instructions.  Will watch your condition.  Will get help right away if you are not doing well or get worse. Document Released: 08/18/2005 Document Revised: 11/10/2011 Document Reviewed: 04/07/2008 [facility] Patient Information 2014 [place], [place].  Hypothyroidism The thyroid is a large gland located in the lower front of your neck. The thyroid gland helps control metabolism. Metabolism is how your body handles food. It controls metabolism with the  hormone thyroxine. When this gland is underactive (hypothyroid), it produces too little hormone.  CAUSES These include:   Absence or destruction of thyroid tissue.  Goiter due to iodine deficiency.  Goiter due to medications.  Congenital defects (since birth).  Problems with the pituitary. This causes a lack of TSH (thyroid stimulating hormone). This hormone tells the thyroid to turn out more hormone. SYMPTOMS  Lethargy (feeling as though you have no energy)  Cold intolerance  Weight gain (in spite of normal food intake)  Dry skin  Coarse hair  Menstrual irregularity (if severe, may lead to infertility)  Slowing of thought processes Cardiac problems are also caused by insufficient amounts of thyroid hormone. Hypothyroidism in the newborn is cretinism, and is an extreme form. It is important that this form be treated adequately and immediately or it will lead rapidly to retarded physical and mental development. DIAGNOSIS  To prove hypothyroidism, your caregiver may do blood tests and ultrasound tests. Sometimes the signs are hidden. It may be necessary for your caregiver to watch this illness with blood tests either before or after diagnosis and treatment. TREATMENT  Low levels of thyroid hormone are increased by using synthetic thyroid hormone. This is a safe, effective treatment. It usually takes about four weeks to gain the full effects of the medication. After you have the full effect of the medication, it will generally take another four weeks for problems to leave. Your caregiver may start you on low doses. If you have had heart problems the dose may be gradually increased. It is generally not an emergency to get rapidly to normal. HOME CARE INSTRUCTIONS   Take your medications as your caregiver suggests. Let your caregiver know of any medications you are taking or start taking. Your caregiver will help you with dosage schedules.  As your condition improves, your dosage  needs may increase. It will be necessary to have continuing blood tests as suggested by your caregiver.  Report all suspected medication side effects to your caregiver. SEEK MEDICAL CARE IF: Seek medical care if you develop:  Sweating.  Tremulousness (tremors).  Anxiety.  Rapid weight loss.  Heat intolerance.  Emotional swings.  Diarrhea.  Weakness. SEEK IMMEDIATE MEDICAL CARE IF:  You develop chest pain, an irregular heart beat (palpitations), or a rapid heart beat. MAKE SURE YOU:   Understand these instructions.  Will watch your condition.  Will get help right away if you are not doing well or get worse. Document Released: 08/18/2005 Document Revised: 11/10/2011 Document Reviewed: 04/07/2008 [facility] Patient Information 2014 [place], [place]. Back Injury Prevention Back injuries can be extremely painful and difficult to heal. After having one back injury, you are much more likely to experience another later on. It is important  to learn how to avoid injuring or re-injuring your back. The following tips can help you to prevent a back injury. PHYSICAL FITNESS  Exercise regularly and try to develop good tone in your abdominal muscles. Your abdominal muscles provide a lot of the support needed by your back.  Do aerobic exercises (walking, jogging, biking, swimming) regularly.  Do exercises that increase balance and strength (tai chi, yoga) regularly. This can decrease your risk of falling and injuring your back.  Stretch before and after exercising.  Maintain a healthy weight. The more you weigh, the more stress is placed on your back. For every pound of weight, 10 times that amount of pressure is placed on the back. DIET  Talk to your caregiver about how much calcium and vitamin D you need per day. These nutrients help to prevent weakening of the bones (osteoporosis). Osteoporosis can cause broken (fractured) bones that lead to back pain.  Include good sources of calcium  in your diet, such as dairy products, green, leafy vegetables, and products with calcium added (fortified).  Include good sources of vitamin D in your diet, such as milk and foods that are fortified with vitamin D.  Consider taking a nutritional supplement or a multivitamin if needed.  Stop smoking if you smoke. POSTURE  Sit and stand up straight. Avoid leaning forward when you sit or hunching over when you stand.  Choose chairs with good low back (lumbar) support.  If you work at a desk, sit close to your work so you do not need to lean over. Keep your chin tucked in. Keep your neck drawn back and elbows bent at a right angle. Your arms should look like the letter "L."  Sit high and close to the steering wheel when you drive. Add a lumbar support to your car seat if needed.  Avoid sitting or standing in one position for too long. Take breaks to get up, stretch, and walk around at least once every hour. Take breaks if you are driving for long periods of time.  Sleep on your side with your knees slightly bent, or sleep on your back with a pillow under your knees. Do not sleep on your stomach. LIFTING, TWISTING, AND REACHING  Avoid heavy lifting, especially repetitive lifting. If you must do heavy lifting:  Stretch before lifting.  Work slowly.  Rest between lifts.  Use carts and dollies to move objects when possible.  Make several small trips instead of carrying 1 heavy load.  Ask for help when you need it.  Ask for help when moving big, awkward objects.  Follow these steps when lifting:  Stand with your feet shoulder-width apart.  Get as close to the object as you can. Do not try to pick up heavy objects that are far from your body.  Use handles or lifting straps if they are available.  Bend at your knees. Squat down, but keep your heels off the floor.  Keep your shoulders pulled back, your chin tucked in, and your back straight.  Lift the object slowly, tightening  the muscles in your legs, abdomen, and buttocks. Keep the object as close to the center of your body as possible.  When you put a load down, use these same guidelines in reverse.  Do not:  Lift the object above your waist.  Twist at the waist while lifting or carrying a load. Move your feet if you need to turn, not your waist.  Bend over without bending at your knees.  Avoid reaching over your head, across a table, or for an object on a high surface. OTHER TIPS  Avoid wet floors and keep sidewalks clear of ice to prevent falls.  Do not sleep on a mattress that is too soft or too hard.  Keep items that are used frequently within easy reach.  Put heavier objects on shelves at waist level and lighter objects on lower or higher shelves.  Find ways to decrease your stress, such as exercise, massage, or relaxation techniques. Stress can build up in your muscles. Tense muscles are more vulnerable to injury.  Seek treatment for depression or anxiety if needed. These conditions can increase your risk of developing back pain. SEEK MEDICAL CARE IF:  You injure your back.  You have questions about diet, exercise, or other ways to prevent back injuries. MAKE SURE YOU:  Understand these instructions.  Will watch your condition.  Will get help right away if you are not doing well or get worse. Document Released: 09/25/2004 Document Revised: 11/10/2011 Document Reviewed: 09/29/2011 [facility] Patient Information 2014 [place], [place].   Back Exercises Back exercises help treat and prevent back injuries. The goal of back exercises is to increase the strength of your abdominal and back muscles and the flexibility of your back. These exercises should be started when you no longer have back pain. Back exercises include:  Pelvic Tilt. Lie on your back with your knees bent. Tilt your pelvis until the lower part of your back is against the floor. Hold this position 5 to 10 sec and repeat 5 to 10  times.  Knee to Chest. Pull first 1 knee up against your chest and hold for 20 to 30 seconds, repeat this with the other knee, and then both knees. This may be done with the other leg straight or bent, whichever feels better.  Sit-Ups or Curl-Ups. Bend your knees 90 degrees. Start with tilting your pelvis, and do a partial, slow sit-up, lifting your trunk only 30 to 45 degrees off the floor. Take at least 2 to 3 seconds for each sit-up. Do not do sit-ups with your knees out straight. If partial sit-ups are difficult, simply do the above but with only tightening your abdominal muscles and holding it as directed.  Hip-Lift. Lie on your back with your knees flexed 90 degrees. Push down with your feet and shoulders as you raise your hips a couple inches off the floor; hold for 10 seconds, repeat 5 to 10 times.  Back arches. Lie on your stomach, propping yourself up on bent elbows. Slowly press on your hands, causing an arch in your low back. Repeat 3 to 5 times. Any initial stiffness and discomfort should lessen with repetition over time.  Shoulder-Lifts. Lie face down with arms beside your body. Keep hips and torso pressed to floor as you slowly lift your head and shoulders off the floor. Do not overdo your exercises, especially in the beginning. Exercises may cause you some mild back discomfort which lasts for a few minutes; however, if the pain is more severe, or lasts for more than 15 minutes, do not continue exercises until you see your caregiver. Improvement with exercise therapy for back problems is slow.  See your caregivers for assistance with developing a proper back exercise program. Document Released: 09/25/2004 Document Revised: 11/10/2011 Document Reviewed: 06/19/2011 [facility] Patient Information 2014 [place], [place].

## 2013-04-19 ENCOUNTER — Ambulatory Visit: Payer: No Typology Code available for payment source | Attending: Family Medicine | Admitting: Internal Medicine

## 2013-04-19 VITALS — BP 156/90 | HR 70 | Temp 98.0°F | Resp 17 | Wt 204.0 lb

## 2013-04-19 DIAGNOSIS — I10 Essential (primary) hypertension: Secondary | ICD-10-CM

## 2013-04-19 DIAGNOSIS — E1110 Type 2 diabetes mellitus with ketoacidosis without coma: Secondary | ICD-10-CM

## 2013-04-19 DIAGNOSIS — F329 Major depressive disorder, single episode, unspecified: Secondary | ICD-10-CM

## 2013-04-19 DIAGNOSIS — E039 Hypothyroidism, unspecified: Secondary | ICD-10-CM

## 2013-04-19 DIAGNOSIS — E1310 Other specified diabetes mellitus with ketoacidosis without coma: Secondary | ICD-10-CM

## 2013-04-19 LAB — POCT GLYCOSYLATED HEMOGLOBIN (HGB A1C): Hemoglobin A1C: 7.9

## 2013-04-19 MED ORDER — HYDROCHLOROTHIAZIDE 25 MG PO TABS: 25.0000 mg | ORAL_TABLET | Freq: Every day | ORAL | Status: DC

## 2013-04-19 MED ORDER — ZOLPIDEM TARTRATE 5 MG PO TABS: 5.0000 mg | ORAL_TABLET | Freq: Every evening | ORAL | Status: DC | PRN

## 2013-04-19 NOTE — Patient Instructions (Addendum)
Hypertension As your heart beats, it forces blood through your arteries. This force is your blood pressure. If the pressure is too high, it is called hypertension (HTN) or high blood pressure. HTN is dangerous because you may have it and not know it. High blood pressure may mean that your heart has to work harder to pump blood. Your arteries may be narrow or stiff. The extra work puts you at risk for heart disease, stroke, and other problems.  Blood pressure consists of two numbers, a higher number over a lower, 110/72, for example. It is stated as "110 over 72." The ideal is below 120 for the top number (systolic) and under 80 for the bottom (diastolic). Write down your blood pressure today. You should pay close attention to your blood pressure if you have certain conditions such as:  Heart failure.  Prior heart attack.  Diabetes  Chronic kidney disease.  Prior stroke.  Multiple risk factors for heart disease. To see if you have HTN, your blood pressure should be measured while you are seated with your arm held at the level of the heart. It should be measured at least twice. A one-time elevated blood pressure reading (especially in the Emergency Department) does not mean that you need treatment. There may be conditions in which the blood pressure is different between your right and left arms. It is important to see your caregiver soon for a recheck. Most people have essential hypertension which means that there is not a specific cause. This type of high blood pressure may be lowered by changing lifestyle factors such as:  Stress.  Smoking.  Lack of exercise.  Excessive weight.  Drug/tobacco/alcohol use.  Eating less salt. Most people do not have symptoms from high blood pressure until it has caused damage to the body. Effective treatment can often prevent, delay or reduce that damage. TREATMENT  When a cause has been identified, treatment for high blood pressure is directed at the  cause. There are a large number of medications to treat HTN. These fall into several categories, and your caregiver will help you select the medicines that are best for you. Medications may have side effects. You should review side effects with your caregiver. If your blood pressure stays high after you have made lifestyle changes or started on medicines,   Your medication(s) may need to be changed.  Other problems may need to be addressed.  Be certain you understand your prescriptions, and know how and when to take your medicine.  Be sure to follow up with your caregiver within the time frame advised (usually within two weeks) to have your blood pressure rechecked and to review your medications.  If you are taking more than one medicine to lower your blood pressure, make sure you know how and at what times they should be taken. Taking two medicines at the same time can result in blood pressure that is too low. SEEK IMMEDIATE MEDICAL CARE IF:  You develop a severe headache, blurred or changing vision, or confusion.  You have unusual weakness or numbness, or a faint feeling.  You have severe chest or abdominal pain, vomiting, or breathing problems. MAKE SURE YOU:   Understand these instructions.  Will watch your condition.  Will get help right away if you are not doing well or get worse. Document Released: 08/18/2005 Document Revised: 11/10/2011 Document Reviewed: 04/07/2008 [employer] Patient Information 2014 [employer]. Hypothyroidism The thyroid is a large gland located in the lower front of your neck. The   thyroid gland helps control metabolism. Metabolism is how your body handles food. It controls metabolism with the hormone thyroxine. When this gland is underactive (hypothyroid), it produces too little hormone.  CAUSES These include:   Absence or destruction of thyroid tissue.  Goiter due to iodine deficiency.  Goiter due to medications.  Congenital defects (since  birth).  Problems with the pituitary. This causes a lack of TSH (thyroid stimulating hormone). This hormone tells the thyroid to turn out more hormone. SYMPTOMS  Lethargy (feeling as though you have no energy)  Cold intolerance  Weight gain (in spite of normal food intake)  Dry skin  Coarse hair  Menstrual irregularity (if severe, may lead to infertility)  Slowing of thought processes Cardiac problems are also caused by insufficient amounts of thyroid hormone. Hypothyroidism in the newborn is cretinism, and is an extreme form. It is important that this form be treated adequately and immediately or it will lead rapidly to retarded physical and mental development. DIAGNOSIS  To prove hypothyroidism, your caregiver may do blood tests and ultrasound tests. Sometimes the signs are hidden. It may be necessary for your caregiver to watch this illness with blood tests either before or after diagnosis and treatment. TREATMENT  Low levels of thyroid hormone are increased by using synthetic thyroid hormone. This is a safe, effective treatment. It usually takes about four weeks to gain the full effects of the medication. After you have the full effect of the medication, it will generally take another four weeks for problems to leave. Your caregiver may start you on low doses. If you have had heart problems the dose may be gradually increased. It is generally not an emergency to get rapidly to normal. HOME CARE INSTRUCTIONS   Take your medications as your caregiver suggests. Let your caregiver know of any medications you are taking or start taking. Your caregiver will help you with dosage schedules.  As your condition improves, your dosage needs may increase. It will be necessary to have continuing blood tests as suggested by your caregiver.  Report all suspected medication side effects to your caregiver. SEEK MEDICAL CARE IF: Seek medical care if you develop:  Sweating.  Tremulousness  (tremors).  Anxiety.  Rapid weight loss.  Heat intolerance.  Emotional swings.  Diarrhea.  Weakness. SEEK IMMEDIATE MEDICAL CARE IF:  You develop chest pain, an irregular heart beat (palpitations), or a rapid heart beat. MAKE SURE YOU:   Understand these instructions.  Will watch your condition.  Will get help right away if you are not doing well or get worse. Document Released: 08/18/2005 Document Revised: 11/10/2011 Document Reviewed: 04/07/2008 [employer] Patient Information 2014 [employer].  

## 2013-04-19 NOTE — Progress Notes (Signed)
Patient ID: Nicolas Ray, male   DOB: 02-25-1951, 62 y.o.   MRN: [id number]  CC: Followup  HPI: Patient is 62 years old man who came in for followup. He has history of diabetes, depression, hypertension, and hypothyroidism. He was started on medications, but patient claimed that he stopped taking them because of side effects. He described a nonspecific fatigue as the side effect of these medications and would not take them again because it makes him so weak. No nausea or vomiting, no headache no chest, no abdominal pain, no change in urine or bowel habits.  No Known Allergies Past Medical History  Diagnosis Date  . Unspecified essential hypertension 02/08/2013  . Thyroid disease   . Depression   . Diabetes mellitus without complication    Current Outpatient Prescriptions on File Prior to Visit  Medication Sig Dispense Refill  . diazepam (VALIUM) 5 MG tablet Take 1 tablet (5 mg total) by mouth every 8 (eight) hours as needed (Take 1-2 pills every 8 hours as needed for pain).  10 tablet  0  . docusate sodium (COLACE) 100 MG capsule Take 1 capsule (100 mg total) by mouth 2 (two) times daily.  10 capsule  0  . DULoxetine (CYMBALTA) 30 MG capsule Take 1 capsule (30 mg total) by mouth daily.  30 capsule  3  . HYDROcodone-acetaminophen (NORCO/VICODIN) 5-325 MG per tablet Take 1 tablet by mouth at bedtime as needed for pain.  20 tablet  0  . ibuprofen (ADVIL,MOTRIN) 600 MG tablet Take 1 tablet (600 mg total) by mouth every 6 (six) hours as needed for pain.  30 tablet  0  . levothyroxine (LEVOTHROID) 25 MCG tablet Take 1 tablet (25 mcg total) by mouth daily before breakfast.  30 tablet  2  . lisinopril (PRINIVIL,ZESTRIL) 5 MG tablet Take 1 tablet (5 mg total) by mouth daily.  30 tablet  3  . metFORMIN (GLUCOPHAGE XR) 500 MG 24 hr tablet Take 1 po daily with breakfast for 1 week, then increase to 1 tablet with breakfast and 1 tablet with supper  60 tablet  3   No current facility-administered medications on  file prior to visit.   Family History  Problem Relation Age of Onset  . Heart disease Mother   . Cancer Father    History   Social History  . Marital Status: Divorced    Spouse Name: N/A    Number of Children: N/A  . Years of Education: N/A   Occupational History  . Not on file.   Social History Main Topics  . Smoking status: Light Tobacco Smoker -- 0.25 packs/day    Types: Cigarettes  . Smokeless tobacco: Not on file  . Alcohol Use: No  . Drug Use: No  . Sexual Activity: Not on file   Other Topics Concern  . Not on file   Social History Narrative  . No narrative on file    Review of Systems: Constitutional: Negative for fever, chills, diaphoresis, activity change, appetite change and fatigue. HENT: Negative for ear pain, nosebleeds, congestion, facial swelling, rhinorrhea, neck pain, neck stiffness and ear discharge.  Eyes: Negative for pain, discharge, redness, itching and visual disturbance. Respiratory: Negative for cough, choking, chest tightness, shortness of breath, wheezing and stridor.  Cardiovascular: Negative for chest pain, palpitations and leg swelling. Gastrointestinal: Negative for abdominal distention. Genitourinary: Negative for dysuria, urgency, frequency, hematuria, flank pain, decreased urine volume, difficulty urinating and dyspareunia.  Musculoskeletal: Negative for back pain, joint swelling, arthralgias and gait  problem. Neurological: Negative for dizziness, tremors, seizures, syncope, facial asymmetry, speech difficulty, weakness, light-headedness, numbness and headaches.  Hematological: Negative for adenopathy. Does not bruise/bleed easily. Psychiatric/Behavioral: Negative for hallucinations, behavioral problems, confusion, dysphoric mood, decreased concentration and agitation.    Objective:   Filed Vitals:   04/19/13 1228  BP: 156/90  Pulse: 70  Temp: 98 F (36.7 C)  Resp: 17    Physical Exam: Constitutional: Patient appears  well-developed and well-nourished. No distress. HENT: Normocephalic, atraumatic, External right and left ear normal. Oropharynx is clear and moist.  Eyes: Conjunctivae and EOM are normal. PERRLA, no scleral icterus. Neck: Normal ROM. Neck supple. No JVD. No tracheal deviation. No thyromegaly. CVS: RRR, S1/S2 +, no murmurs, no gallops, no carotid bruit.  Pulmonary: Effort and breath sounds normal, no stridor, rhonchi, wheezes, rales.  Abdominal: Soft. BS +,  no distension, tenderness, rebound or guarding.  Musculoskeletal: Normal range of motion. No edema and no tenderness.  Lymphadenopathy: No lymphadenopathy noted, cervical, inguinal or axillary Neuro: Alert. Normal reflexes, muscle tone coordination. No cranial nerve deficit. Skin: Skin is warm and dry. No rash noted. Not diaphoretic. No erythema. No pallor. Psychiatric: Normal mood and affect. Behavior, judgment, thought content normal.  Lab Results  Component Value Date   WBC 8.4 01/25/2013   HGB 13.3 01/25/2013   HCT 39.4 01/25/2013   MCV 64.6* 01/25/2013   PLT 197 01/25/2013   Lab Results  Component Value Date   CREATININE 0.93 01/25/2013   BUN 17 01/25/2013   NA 134* 01/25/2013   K 4.1 01/25/2013   CL 103 01/25/2013   CO2 21 01/25/2013    Lab Results  Component Value Date   HGBA1C 7.9% 04/19/2013   Lipid Panel     Component Value Date/Time   CHOL 175 01/25/2013 1039   TRIG 232* 01/25/2013 1039   HDL 35* 01/25/2013 1039   CHOLHDL 5.0 01/25/2013 1039   VLDL 46* 01/25/2013 1039   LDLCALC 94 01/25/2013 1039       Assessment and plan:   Patient Active Problem List   Diagnosis Date Noted  . Sciatica 03/15/2013  . Unspecified hypothyroidism 02/08/2013  . Type II or unspecified type diabetes mellitus without mention of complication, uncontrolled 02/08/2013  . Unspecified essential hypertension 02/08/2013  . Depression 01/25/2013  . Low back pain 01/25/2013   Non-compliance: Had an extensive long discussion with the patient  about the need to be compliant with medications, the likely side effects were discussed with the patient's, he verbalized understanding and plan to be compliant from now Patient is off uncontrolled hypertension and diabetes were discussed with patient Ambulatory referral to nutrition the clinic for counseling Medications were refilled  Lab: TSH and free T4  Nicolas Ray was given clear instructions to go to ER or return to the clinic if symptoms don't improve, worsen or new problems develop.  Nicolas Ray verbalized understanding.  Nicolas Ray was told to call to get lab results if hasn't heard anything in the next week.         Jeanann Lewandowsky, MD [facility] [place], [place] [phone]   04/19/2013, 2:09 PM

## 2013-04-19 NOTE — Progress Notes (Signed)
Patient here for follow up, States has stopped taking metformin and synthroid  About two weeks ago because it does not make him feel good

## 2013-04-20 ENCOUNTER — Encounter: Payer: No Typology Code available for payment source | Attending: Family Medicine | Admitting: *Deleted

## 2013-04-20 ENCOUNTER — Encounter: Payer: Self-pay | Admitting: *Deleted

## 2013-04-20 VITALS — Ht 68.0 in | Wt 206.8 lb

## 2013-04-20 DIAGNOSIS — Z713 Dietary counseling and surveillance: Secondary | ICD-10-CM | POA: Insufficient documentation

## 2013-04-20 DIAGNOSIS — I10 Essential (primary) hypertension: Secondary | ICD-10-CM | POA: Insufficient documentation

## 2013-04-20 DIAGNOSIS — E119 Type 2 diabetes mellitus without complications: Secondary | ICD-10-CM | POA: Insufficient documentation

## 2013-04-20 DIAGNOSIS — E1110 Type 2 diabetes mellitus with ketoacidosis without coma: Secondary | ICD-10-CM | POA: Insufficient documentation

## 2013-04-20 NOTE — Progress Notes (Deleted)
Subjective:     Patient ID: Nicolas Ray, male   DOB: 08-30-1951, 62 y.o.   MRN: [id number]  Diabetes  Hypertension     Review of Systems     Objective:   Physical Exam     Assessment:     ***    Plan:     ***

## 2013-04-20 NOTE — Progress Notes (Addendum)
  Appt start time: 1500 end time:  1630.   Assessment:  Patient was seen on  04/20/13 for individual diabetes education.Diagnosed in 2012 with T2DM and hypothyroidism. Patient born in [place] yet grew up in the [place]. Has come here from [place] to be with supportive family. Lives with two sisters. All members of household do the shopping and cooking. Will occasionally eat out at [employer]. Has significant history of depression which complicates perception of how to manage diabetes. He was prescribed Metformin and synthroid which he took for only a few days. He discontinued on his own because he perceived that they made him sluggish. He is accompanied by close family friend who has two brothers with type one diabetes. She notes that his depression is actually what is making him sluggish. Patient is not testing glucose. He and his friend both note that he has not been instructed to do so. Patient feels that his health is Excellent. Sees provider every 3 months. No hospitalizations required. Does not check feet at present yet has agreed to in on a daily basis.  Current HbA1c: 7.9%  MEDICATIONS: Originally prescribed Metformin 500mg  with breakfast for 1 week. Increase to Metformin 500 mg with breakfast and dinner. He is presently not taking this medication. I have encouraged him to take as prescribed with food on his stomach. I have also encouraged him to take his synthroid, HCTZ and Ambien. He has agreed to make sincere effort.    DIETARY INTAKE:  Usual eating pattern includes 2 meals and 1 snacks per day.  Everyday foods include starch, vegetables, proteins, fast food, milk, ice cream, brownies.  Avoided foods include none.    24-hr recall:  B ( AM): 3 fried eggs and 3 pieces of toast, water  Snk ( AM): none  L ( PM): hamburger with lettuce, tomato, cheese, mayonaise Snk ( PM):  D ( PM): rice, peppers, tomatoes Snk ( PM): ice cream Beverages: water, milk, regular coke (1Xweek)  Usual physical  activity: walks at the mall for 15 minutes 3X week   Progress Towards Goal(s):  In progress.   Nutritional Diagnosis:  NB-1.1 Food and nutrition-related knowledge deficit As related to diabetes control.  As evidenced by A1c of 7.9%..    Intervention:  Nutrition counseling provided.  Discussed diabetes disease process and treatment options.  Discussed physiology of diabetes and role of obesity on insulin resistance.  Encouraged moderate weight reduction to improve glucose levels.  Discussed role of medications and diet in glucose control  Provided education on macronutrients on glucose levels.  Provided education on carb counting, importance of regularly scheduled meals/snacks, and meal planning  Discussed effects of physical activity on glucose levels and long-term glucose control.  Recommended 150 minutes of physical activity/week.  Reviewed patient medications.  Discussed role of medication on blood glucose and possible side effects  Described short-term complications: hyper- and hypo-glycemia.  Discussed causes,symptoms, and treatment options.  Discussed prevention, detection, and treatment of long-term complications.  Discussed the role of prolonged elevated glucose levels on body systems.  Handouts given during visit include: Living Well with Diabetes Carb Counting  Meal Plan Card  Barriers to learning/adherance to lifestyle change: Depression  Diabetes self-care support plan:  Family  Friends   Monitoring/Evaluation:  Dietary intake, exercise, A1c, and body weight in 1 month(s).

## 2013-06-15 ENCOUNTER — Telehealth: Payer: Self-pay | Admitting: Internal Medicine

## 2013-06-15 ENCOUNTER — Ambulatory Visit: Payer: No Typology Code available for payment source | Attending: Internal Medicine | Admitting: Internal Medicine

## 2013-06-15 ENCOUNTER — Encounter: Payer: Self-pay | Admitting: Internal Medicine

## 2013-06-15 VITALS — BP 147/87 | HR 68 | Temp 97.4°F | Resp 16 | Ht 68.0 in | Wt 206.0 lb

## 2013-06-15 DIAGNOSIS — E039 Hypothyroidism, unspecified: Secondary | ICD-10-CM | POA: Insufficient documentation

## 2013-06-15 DIAGNOSIS — IMO0001 Reserved for inherently not codable concepts without codable children: Secondary | ICD-10-CM | POA: Insufficient documentation

## 2013-06-15 DIAGNOSIS — Z23 Encounter for immunization: Secondary | ICD-10-CM

## 2013-06-15 DIAGNOSIS — I10 Essential (primary) hypertension: Secondary | ICD-10-CM

## 2013-06-15 LAB — T4, FREE: Free T4: 0.71 ng/dL — ABNORMAL LOW (ref 0.80–1.80)

## 2013-06-15 MED ORDER — GLUCOSE BLOOD VI STRP: ORAL_STRIP | Status: DC

## 2013-06-15 MED ORDER — FREESTYLE SYSTEM KIT: 1.0000 | PACK | Status: DC | PRN

## 2013-06-15 MED ORDER — AMLODIPINE BESYLATE 5 MG PO TABS: 5.0000 mg | ORAL_TABLET | Freq: Every day | ORAL | Status: DC

## 2013-06-15 MED ORDER — LEVOTHYROXINE SODIUM 50 MCG PO TABS: 50.0000 ug | ORAL_TABLET | Freq: Every day | ORAL | Status: DC

## 2013-06-15 MED ORDER — ALCOHOL SWABS 70 % PADS: 1.0000 "application " | MEDICATED_PAD | Freq: Every day | Status: DC

## 2013-06-15 MED ORDER — ACCU-CHEK MULTICLIX LANCETS MISC: Status: DC

## 2013-06-15 NOTE — Progress Notes (Signed)
Pt is here following up on his diabetes and HTN. Pt is requesting to review his lab results.  

## 2013-06-15 NOTE — Progress Notes (Signed)
Patient ID: Nicolas Ray, male   DOB: 03/10/1951, 62 y.o.   MRN: [id number]   62 year old with history of hypertension, diabetes mellitus, hypothyroidism here for followup. Patient had A1c checked last time which was 7.9. He is on metformin 500 mg twice a day. He has not been checking his blood sugar regularly. But does report being compliant with his medication and diet. Denies any polyuria, polydipsia, continues week her appetite. He reported feeling the baby week on his last visit but denies those symptoms at present. During the last visit for TSH and free T4 was checked but somehow no results were documented. Patient denies any headache, blurred vision, dizziness, chest pain, palpitations, shortness of breath, abdominal pain, nausea, vomiting, bowel symptoms. He does report increased frequency of urination after being started on HCTZ and does bother his sleep a lot.  Vital signs in last 24 hours:  Filed Vitals:   06/15/13 1205  BP: 147/87  Pulse: 68  Temp: 97.4 F (36.3 C)  TempSrc: Oral  Resp: 16  Height: 5\' 8"  (1.727 m)  Weight: 206 lb (93.441 kg)  SpO2: 96%      Physical Exam:  General: Middle aged male in no acute distress. HEENT: no pallor, no icterus, moist oral mucosa, no JVD, no lymphadenopathy Heart: Normal  s1 &s2  Regular rate and rhythm, Lungs: Clear to auscultation bilaterally. Abdomen: Soft, nontender, nondistended, positive bowel sounds. Extremities: No clubbing cyanosis or edema with positive pedal pulses. Neuro: Alert, awake, oriented x3, nonfocal.   Lab Results:  Basic Metabolic Panel:    Component Value Date/Time   NA 134* 01/25/2013 1039   K 4.1 01/25/2013 1039   CL 103 01/25/2013 1039   CO2 21 01/25/2013 1039   BUN 17 01/25/2013 1039   CREATININE 0.93 01/25/2013 1039   GLUCOSE 201* 01/25/2013 1039   CALCIUM 9.3 01/25/2013 1039   CBC:    Component Value Date/Time   WBC 8.4 01/25/2013 1039   HGB 13.3 01/25/2013 1039   HCT 39.4 01/25/2013 1039   PLT 197  01/25/2013 1039   MCV 64.6* 01/25/2013 1039   NEUTROABS 5.2 01/25/2013 1039   LYMPHSABS 2.0 01/25/2013 1039   MONOABS 0.8 01/25/2013 1039   EOSABS 0.5 01/25/2013 1039   BASOSABS 0.0 01/25/2013 1039    No results found for this or any previous visit (from the past 240 hour(s)).  Studies/Results: No results found.  Medications: Reviewed  Assessment/Plan: Hypothyroidism Reviewed labs from last visit. TSH of 18 with free T4 of 0.88 Will increase synthroid to 50 mcg daily.  Follow up in 3 months for repeat TSH  Uncontrolled diabetes We'll prescribe a glucometer and test strips. Counseled on checking his blood glucose regularly and keeping in all of the records and bring it during his next visit Continue metformin for now. A1C of 7.2 today.  Hypertension Will discontinue the HCTZ and switch to amlodipine 5 mg daily. Continue lisinopril. Blood pressure fairly controlled. Counseled on diet adherence and regular exercise  Health maintenance Ordered flu vaccine. Followup in 3 months  Verity Gilcrest 06/15/2013, 12:30 PM

## 2013-06-21 ENCOUNTER — Encounter: Payer: Self-pay | Admitting: *Deleted

## 2013-06-21 ENCOUNTER — Encounter: Payer: No Typology Code available for payment source | Attending: Family Medicine | Admitting: *Deleted

## 2013-06-21 VITALS — Ht 68.0 in | Wt 205.6 lb

## 2013-06-21 DIAGNOSIS — Z713 Dietary counseling and surveillance: Secondary | ICD-10-CM | POA: Insufficient documentation

## 2013-06-21 DIAGNOSIS — I10 Essential (primary) hypertension: Secondary | ICD-10-CM | POA: Insufficient documentation

## 2013-06-21 DIAGNOSIS — E119 Type 2 diabetes mellitus without complications: Secondary | ICD-10-CM | POA: Insufficient documentation

## 2013-07-15 ENCOUNTER — Encounter: Payer: Self-pay | Admitting: Internal Medicine

## 2013-07-15 ENCOUNTER — Ambulatory Visit: Payer: Self-pay

## 2013-07-15 ENCOUNTER — Ambulatory Visit: Payer: No Typology Code available for payment source | Attending: Internal Medicine | Admitting: Internal Medicine

## 2013-07-15 VITALS — BP 171/90 | HR 83 | Temp 98.5°F | Resp 18 | Wt 207.0 lb

## 2013-07-15 DIAGNOSIS — E119 Type 2 diabetes mellitus without complications: Secondary | ICD-10-CM | POA: Insufficient documentation

## 2013-07-15 DIAGNOSIS — I10 Essential (primary) hypertension: Secondary | ICD-10-CM | POA: Insufficient documentation

## 2013-07-15 DIAGNOSIS — Z91199 Patient's noncompliance with other medical treatment and regimen due to unspecified reason: Secondary | ICD-10-CM | POA: Insufficient documentation

## 2013-07-15 DIAGNOSIS — F329 Major depressive disorder, single episode, unspecified: Secondary | ICD-10-CM

## 2013-07-15 DIAGNOSIS — M545 Low back pain: Secondary | ICD-10-CM

## 2013-07-15 DIAGNOSIS — Z9119 Patient's noncompliance with other medical treatment and regimen: Secondary | ICD-10-CM | POA: Insufficient documentation

## 2013-07-15 DIAGNOSIS — K5900 Constipation, unspecified: Secondary | ICD-10-CM | POA: Insufficient documentation

## 2013-07-15 LAB — POCT GLYCOSYLATED HEMOGLOBIN (HGB A1C): Hemoglobin A1C: 7.5

## 2013-07-15 LAB — POCT CBG (FASTING - GLUCOSE)-MANUAL ENTRY: Glucose Fasting, POC: 176 mg/dL — AB (ref 70–99)

## 2013-07-15 MED ORDER — DOCUSATE SODIUM 100 MG PO CAPS: 100.0000 mg | ORAL_CAPSULE | Freq: Two times a day (BID) | ORAL | Status: DC

## 2013-07-15 MED ORDER — METFORMIN HCL ER (OSM) 1000 MG PO TB24: ORAL_TABLET | ORAL | Status: DC

## 2013-07-15 MED ORDER — AMLODIPINE BESYLATE 10 MG PO TABS: 10.0000 mg | ORAL_TABLET | Freq: Every day | ORAL | Status: DC

## 2013-07-15 NOTE — Progress Notes (Signed)
Pt is here for a f/u on DM, HTN and thyroids CBG today is = 176 fasting  Voices no new concerns.... Alert w/no signs of acute distress

## 2013-07-15 NOTE — Progress Notes (Signed)
Patient ID: Nicolas Ray, male   DOB: 1950-12-03, 62 y.o.   MRN: [id number]   Patient Demographics  Nicolas Ray, is a 62 y.o. male  WUJ:[id number]  [id number]  DOB - 1951-03-10  Chief Complaint  Patient presents with  . Follow-up        Subjective:   Nicolas Ray with History of diabetes mellitus type 2, hypothyroidism, long-standing history of noncompliance with medications is here for followup, his only complaint is mild constipation.  Denies any subjective complaints except as above, no active headache, no chest abdominal pain at this time, not short of breath. No focal weakness which is new.   Objective:    Patient Active Problem List   Diagnosis Date Noted  . DM (diabetes mellitus) type 2, uncontrolled, with ketoacidosis 04/20/2013  . Sciatica 03/15/2013  . Unspecified hypothyroidism 02/08/2013  . Type II or unspecified type diabetes mellitus without mention of complication, uncontrolled 02/08/2013  . Unspecified essential hypertension 02/08/2013  . Depression 01/25/2013  . Low back pain 01/25/2013     Filed Vitals:   07/15/13 0957  BP: 171/90  Pulse: 83  Temp: 98.5 F (36.9 C)  TempSrc: Oral  Resp: 18  Weight: 207 lb (93.895 kg)  SpO2: 97%     Exam   Awake Alert, Oriented X 3, No new F.N deficits,  Not suicidal homicidal but has a flat affect Lane.AT,PERRAL Supple Neck,No JVD, No cervical lymphadenopathy appriciated.  Symmetrical Chest wall movement, Good air movement bilaterally, CTAB RRR,No Gallops,Rubs or new Murmurs, No Parasternal Heave +ve B.Sounds, Abd Soft, Non tender, No organomegaly appriciated, No rebound - guarding or rigidity. No Cyanosis, Clubbing or edema, No new Rash or bruise       Data Review   Lab Results  Component Value Date   WBC 8.4 01/25/2013   HGB 13.3 01/25/2013   HCT 39.4 01/25/2013   MCV 64.6* 01/25/2013   PLT 197 01/25/2013      Chemistry      Component Value Date/Time   NA 134* 01/25/2013 1039   K 4.1 01/25/2013 1039    CL 103 01/25/2013 1039   CO2 21 01/25/2013 1039   BUN 17 01/25/2013 1039   CREATININE 0.93 01/25/2013 1039      Component Value Date/Time   CALCIUM 9.3 01/25/2013 1039   ALKPHOS 57 01/25/2013 1039   AST 16 01/25/2013 1039   ALT 17 01/25/2013 1039   BILITOT 1.1 01/25/2013 1039       Lab Results  Component Value Date   HGBA1C 7.5 07/15/2013    Lab Results  Component Value Date   CHOL 175 01/25/2013   HDL 35* 01/25/2013   LDLCALC 94 01/25/2013   TRIG 232* 01/25/2013   CHOLHDL 5.0 01/25/2013    Lab Results  Component Value Date   TSH 19.338* 06/15/2013    No results found for this basename: PSA      Prior to Admission medications   Medication Sig Start Date End Date Taking? Authorizing Provider  amLODipine (NORVASC) 10 MG tablet Take 1 tablet (10 mg total) by mouth daily. 07/15/13  Yes Leroy Sea, MD  glucose blood (BL TEST STRIP PACK) test strip Use as instructed 06/15/13  Yes Nishant Dhungel, MD  glucose monitoring kit (FREESTYLE) monitoring kit 1 each by Does not apply route as needed for other. 06/15/13  Yes Nishant Dhungel, MD  HYDROcodone-acetaminophen (NORCO/VICODIN) 5-325 MG per tablet Take 1 tablet by mouth at bedtime as needed for pain. 03/15/13  Yes Clanford L  Laural Benes, MD  ibuprofen (ADVIL,MOTRIN) 600 MG tablet Take 1 tablet (600 mg total) by mouth every 6 (six) hours as needed for pain. 03/11/13  Yes Junius Finner, PA-C  Lancets (ACCU-CHEK MULTICLIX) lancets Use as instructed 06/15/13  Yes Nishant Dhungel, MD  levothyroxine (LEVOTHROID) 50 MCG tablet Take 1 tablet (50 mcg total) by mouth daily before breakfast. 06/15/13  Yes Nishant Dhungel, MD  lisinopril (PRINIVIL,ZESTRIL) 5 MG tablet Take 1 tablet (5 mg total) by mouth daily. 02/08/13  Yes Clanford Cyndie Mull, MD  metformin (GLUCOPHAGE XR) 1000 MG (OSM) 24 hr tablet Take 1 po daily with breakfast for 1 week, then increase to 1 tablet with breakfast and 1 tablet with supper 07/15/13  Yes Leroy Sea, MD  Alcohol  Swabs 70 % PADS 1 application by Does not apply route daily. 06/15/13   Nishant Dhungel, MD  diazepam (VALIUM) 5 MG tablet Take 1 tablet (5 mg total) by mouth every 8 (eight) hours as needed (Take 1-2 pills every 8 hours as needed for pain). 03/11/13   Junius Finner, PA-C  docusate sodium (COLACE) 100 MG capsule Take 1 capsule (100 mg total) by mouth 2 (two) times daily. 01/25/13   Richarda Overlie, MD  DULoxetine (CYMBALTA) 30 MG capsule Take 1 capsule (30 mg total) by mouth daily. 01/25/13   Richarda Overlie, MD  zolpidem (AMBIEN) 5 MG tablet Take 1 tablet (5 mg total) by mouth at bedtime as needed for sleep. 04/19/13   Jeanann Lewandowsky, MD     Assessment & Plan    Continued medication noncompliance. Despite counseling with yes times in this clinic by multiple providers patient continues to be extremely noncompliant with his medications, he says medications make him constipated, he was counseled again extensively he was told that his low thyroid levels could be the cause of his constipation as opposed to the Synthroid pills. I will also refer him to psych to seek help.    Diabetes mellitus type 2 - control due to noncompliance, he is on Glucophage XL 500 I will increase it to thousand milligrams, he has testing supplies and was told to check himself q. a.c. at bedtime [place] log book and bring it back next visit. A1c will be checked. He recently had his eye exam.    Hypertension. Poor control. We'll increase Norvasc to 10 mg, will not adjust medications aggressively further as part of his problem is noncompliance also.    Report thyroid isn't. Continue present dose Synthroid which was recently increased, check TSH. Will not further increase Synthroid as he is completely noncompliant, once he becomes compliant we will adjust medications further.     Depression and medication noncompliance. On Cymbalta continue. Not suicidal homicidal. Refer to psych.    Constipation given  Colace.     Routine health maintenance.  He is up-to-date on flu shot. GI referral made for surveillance colonoscopy   Leroy Sea M.D on 07/15/2013 at 10:06 AM    Patient was given clear instructions to go to ER or return to the clinic if symptoms don't improve, worsen or new problems develop. Patient verbalized understanding. Patient was told to call to get lab results if hasn't heard anything in the next week.

## 2013-07-16 LAB — TSH: TSH: 28.365 u[IU]/mL — ABNORMAL HIGH (ref 0.350–4.500)

## 2013-07-18 ENCOUNTER — Telehealth: Payer: Self-pay | Admitting: Emergency Medicine

## 2013-07-18 NOTE — Progress Notes (Signed)
Pt given lab results and f/u lab appt

## 2013-07-18 NOTE — Progress Notes (Signed)
Quick Note:  TSH was noted to be high, patient has no history of noncompliance with medications, I will not increase his Synthroid as he has admittedly been not taking his Synthroid regularly. Once is compliant we will repeat TSH in a month.  please have patient come back in a month's time to get TSH repeated. ______

## 2013-07-18 NOTE — Telephone Encounter (Signed)
Pt given lab results and scheduled  F/u TSH

## 2013-07-18 NOTE — Telephone Encounter (Signed)
Message copied by Darlis Loan on Mon Jul 18, 2013  1:29 PM ------      Message from: [facility], [place] K      Created: Mon Jul 18, 2013  9:16 AM       TSH was noted to be high, patient has no history of noncompliance with medications, I will not increase his Synthroid as he has admittedly been not taking his Synthroid regularly. Once is compliant we will repeat TSH in a month.            please have patient come back in a month's time to get TSH repeated. ------

## 2013-07-20 ENCOUNTER — Telehealth: Payer: Self-pay | Admitting: Emergency Medicine

## 2013-08-05 ENCOUNTER — Encounter: Payer: Self-pay | Admitting: Internal Medicine

## 2013-08-17 ENCOUNTER — Other Ambulatory Visit: Payer: No Typology Code available for payment source

## 2013-09-06 ENCOUNTER — Encounter: Payer: No Typology Code available for payment source | Attending: Family Medicine | Admitting: *Deleted

## 2013-09-06 VITALS — Ht 68.0 in | Wt 203.7 lb

## 2013-09-06 DIAGNOSIS — Z713 Dietary counseling and surveillance: Secondary | ICD-10-CM | POA: Insufficient documentation

## 2013-09-06 DIAGNOSIS — E119 Type 2 diabetes mellitus without complications: Secondary | ICD-10-CM | POA: Insufficient documentation

## 2013-09-06 DIAGNOSIS — E1165 Type 2 diabetes mellitus with hyperglycemia: Secondary | ICD-10-CM

## 2013-09-06 DIAGNOSIS — IMO0001 Reserved for inherently not codable concepts without codable children: Secondary | ICD-10-CM

## 2013-09-06 DIAGNOSIS — I10 Essential (primary) hypertension: Secondary | ICD-10-CM | POA: Insufficient documentation

## 2013-09-06 NOTE — Progress Notes (Signed)
DIABETES: Maximilian returns in follow-up of T2DM. He does not test his glucose. He is depressed and tearful. He was given prescriptions at his last visit at [facility] that he has not filled. He continues to have difficulty sleeping and utilizes diphenhydramine with some improvement. He has Ambien but does not feel it works. I stressed the need for Salam to get his prescriptions filled and take regularly. He has been counseled by myself all visits.  I explained that his last BP reading was 171/90 and that BP is the "silent killer".I question his ability to comprehend, although it may be a result of his depression. He states that he physically feels fine and does not need the medications. Plan: return PRN

## 2013-09-19 ENCOUNTER — Ambulatory Visit (AMBULATORY_SURGERY_CENTER): Payer: Self-pay | Admitting: *Deleted

## 2013-09-19 VITALS — Ht 68.0 in | Wt 207.2 lb

## 2013-09-19 DIAGNOSIS — Z1211 Encounter for screening for malignant neoplasm of colon: Secondary | ICD-10-CM

## 2013-09-19 MED ORDER — MOVIPREP 100 G PO SOLR: ORAL | Status: DC

## 2013-09-19 NOTE — Progress Notes (Signed)
No allergies to eggs or soy. No prior anesthesia.  

## 2013-10-05 ENCOUNTER — Encounter: Payer: Self-pay | Admitting: Internal Medicine

## 2013-10-13 ENCOUNTER — Encounter (INDEPENDENT_AMBULATORY_CARE_PROVIDER_SITE_OTHER): Payer: Self-pay

## 2013-10-13 ENCOUNTER — Ambulatory Visit: Payer: Self-pay | Attending: Internal Medicine | Admitting: Internal Medicine

## 2013-10-13 ENCOUNTER — Encounter: Payer: Self-pay | Admitting: Internal Medicine

## 2013-10-13 VITALS — BP 173/100 | HR 70 | Resp 16

## 2013-10-13 DIAGNOSIS — E039 Hypothyroidism, unspecified: Secondary | ICD-10-CM | POA: Insufficient documentation

## 2013-10-13 DIAGNOSIS — Z79899 Other long term (current) drug therapy: Secondary | ICD-10-CM | POA: Insufficient documentation

## 2013-10-13 DIAGNOSIS — E119 Type 2 diabetes mellitus without complications: Secondary | ICD-10-CM | POA: Insufficient documentation

## 2013-10-13 DIAGNOSIS — I10 Essential (primary) hypertension: Secondary | ICD-10-CM | POA: Insufficient documentation

## 2013-10-13 DIAGNOSIS — Z7982 Long term (current) use of aspirin: Secondary | ICD-10-CM | POA: Insufficient documentation

## 2013-10-13 LAB — COMPLETE METABOLIC PANEL WITH GFR
ALK PHOS: 59 U/L (ref 39–117)
ALT: 16 U/L (ref 0–53)
AST: 14 U/L (ref 0–37)
Albumin: 4.5 g/dL (ref 3.5–5.2)
BILIRUBIN TOTAL: 0.9 mg/dL (ref 0.2–1.2)
BUN: 19 mg/dL (ref 6–23)
CO2: 23 mEq/L (ref 19–32)
CREATININE: 1.1 mg/dL (ref 0.50–1.35)
Calcium: 9.1 mg/dL (ref 8.4–10.5)
Chloride: 104 mEq/L (ref 96–112)
GFR, EST NON AFRICAN AMERICAN: 72 mL/min
GFR, Est African American: 83 mL/min
GLUCOSE: 143 mg/dL — AB (ref 70–99)
Potassium: 4.4 mEq/L (ref 3.5–5.3)
Sodium: 137 mEq/L (ref 135–145)
Total Protein: 7.8 g/dL (ref 6.0–8.3)

## 2013-10-13 LAB — POCT GLYCOSYLATED HEMOGLOBIN (HGB A1C): Hemoglobin A1C: 7.4

## 2013-10-13 LAB — GLUCOSE, POCT (MANUAL RESULT ENTRY): POC GLUCOSE: 147 mg/dL — AB (ref 70–99)

## 2013-10-13 LAB — TSH: TSH: 24.455 u[IU]/mL — ABNORMAL HIGH (ref 0.350–4.500)

## 2013-10-13 LAB — T4, FREE: FREE T4: 0.66 ng/dL — AB (ref 0.80–1.80)

## 2013-10-13 NOTE — Progress Notes (Signed)
Pt is here following up on his HTN and diabetes.

## 2013-10-13 NOTE — Patient Instructions (Signed)
Diabetes and Exercise Exercising regularly is important. It is not just about losing weight. It has many health benefits, such as:  Improving your overall fitness, flexibility, and endurance.  Increasing your bone density.  Helping with weight control.  Decreasing your body fat.  Increasing your muscle strength.  Reducing stress and tension.  Improving your overall health. People with diabetes who exercise gain additional benefits because exercise:  Reduces appetite.  Improves the body's use of blood sugar (glucose).  Helps lower or control blood glucose.  Decreases blood pressure.  Helps control blood lipids (such as cholesterol and triglycerides).  Improves the body's use of the hormone insulin by:  Increasing the body's insulin sensitivity.  Reducing the body's insulin needs.  Decreases the risk for heart disease because exercising:  Lowers cholesterol and triglycerides levels.  Increases the levels of good cholesterol (such as high-density lipoproteins [HDL]) in the body.  Lowers blood glucose levels. YOUR ACTIVITY PLAN  Choose an activity that you enjoy and set realistic goals. Your health care provider or diabetes educator can help you make an activity plan that works for you. You can break activities into 2 or 3 sessions throughout the day. Doing so is as good as one long session. Exercise ideas include:  Taking the dog for a walk.  Taking the stairs instead of the elevator.  Dancing to your favorite song.  Doing your favorite exercise with a friend. RECOMMENDATIONS FOR EXERCISING WITH TYPE 1 OR TYPE 2 DIABETES   Check your blood glucose before exercising. If blood glucose levels are greater than 240 mg/dL, check for urine ketones. Do not exercise if ketones are present.  Avoid injecting insulin into areas of the body that are going to be exercised. For example, avoid injecting insulin into:  The arms when playing tennis.  The legs when  jogging.  Keep a record of:  Food intake before and after you exercise.  Expected peak times of insulin action.  Blood glucose levels before and after you exercise.  The type and amount of exercise you have done.  Review your records with your health care provider. Your health care provider will help you to develop guidelines for adjusting food intake and insulin amounts before and after exercising.  If you take insulin or oral hypoglycemic agents, watch for signs and symptoms of hypoglycemia. They include:  Dizziness.  Shaking.  Sweating.  Chills.  Confusion.  Drink plenty of water while you exercise to prevent dehydration or heat stroke. Body water is lost during exercise and must be replaced.  Talk to your health care provider before starting an exercise program to make sure it is safe for you. Remember, almost any type of activity is better than none. Document Released: 11/08/2003 Document Revised: 04/20/2013 Document Reviewed: 01/25/2013 ExitCare Patient Information 2014 [employer]. Hypertension As your heart beats, it forces blood through your arteries. This force is your blood pressure. If the pressure is too high, it is called hypertension (HTN) or high blood pressure. HTN is dangerous because you may have it and not know it. High blood pressure may mean that your heart has to work harder to pump blood. Your arteries may be narrow or stiff. The extra work puts you at risk for heart disease, stroke, and other problems.  Blood pressure consists of two numbers, a higher number over a lower, 110/72, for example. It is stated as "110 over 72." The ideal is below 120 for the top number (systolic) and under 80 for the bottom (  diastolic). Write down your blood pressure today. You should pay close attention to your blood pressure if you have certain conditions such as:  Heart failure.  Prior heart attack.  Diabetes  Chronic kidney disease.  Prior stroke.  Multiple  risk factors for heart disease. To see if you have HTN, your blood pressure should be measured while you are seated with your arm held at the level of the heart. It should be measured at least twice. A one-time elevated blood pressure reading (especially in the Emergency Department) does not mean that you need treatment. There may be conditions in which the blood pressure is different between your right and left arms. It is important to see your caregiver soon for a recheck. Most people have essential hypertension which means that there is not a specific cause. This type of high blood pressure may be lowered by changing lifestyle factors such as:  Stress.  Smoking.  Lack of exercise.  Excessive weight.  Drug/tobacco/alcohol use.  Eating less salt. Most people do not have symptoms from high blood pressure until it has caused damage to the body. Effective treatment can often prevent, delay or reduce that damage. TREATMENT  When a cause has been identified, treatment for high blood pressure is directed at the cause. There are a large number of medications to treat HTN. These fall into several categories, and your caregiver will help you select the medicines that are best for you. Medications may have side effects. You should review side effects with your caregiver. If your blood pressure stays high after you have made lifestyle changes or started on medicines,   Your medication(s) may need to be changed.  Other problems may need to be addressed.  Be certain you understand your prescriptions, and know how and when to take your medicine.  Be sure to follow up with your caregiver within the time frame advised (usually within two weeks) to have your blood pressure rechecked and to review your medications.  If you are taking more than one medicine to lower your blood pressure, make sure you know how and at what times they should be taken. Taking two medicines at the same time can result in blood  pressure that is too low. SEEK IMMEDIATE MEDICAL CARE IF:  You develop a severe headache, blurred or changing vision, or confusion.  You have unusual weakness or numbness, or a faint feeling.  You have severe chest or abdominal pain, vomiting, or breathing problems. MAKE SURE YOU:   Understand these instructions.  Will watch your condition.  Will get help right away if you are not doing well or get worse. Document Released: 08/18/2005 Document Revised: 11/10/2011 Document Reviewed: 04/07/2008 ExitCare Patient Information 2014 [employer].  

## 2013-10-14 ENCOUNTER — Telehealth: Payer: Self-pay | Admitting: *Deleted

## 2013-10-14 LAB — MICROALBUMIN / CREATININE URINE RATIO
Creatinine, Urine: 92.3 mg/dL
MICROALB UR: 30.99 mg/dL — AB (ref 0.00–1.89)
Microalb Creat Ratio: 335.8 mg/g — ABNORMAL HIGH (ref 0.0–30.0)

## 2013-10-14 NOTE — Telephone Encounter (Signed)
Spoke with patient and informed him of below 

## 2013-10-14 NOTE — Telephone Encounter (Signed)
Message copied by Johny Shears on Fri Oct 14, 2013  2:25 PM ------      Message from: Tresa Garter      Created: Fri Oct 14, 2013 10:29 AM       Please inform patient that we have evidence of uncontrolled hypertension and diabetes effect on the kidneys because of some protein in the urine. His thyroid function level is also very low, since patient has not been taking his medication we encourage him to start taking them now until we repeat the test in 6 months ------

## 2013-10-19 NOTE — Progress Notes (Signed)
Patient ID: Nicolas Ray, male   DOB: June 07, 1951, 62 y.o.   MRN: [id number]   Nicolas Ray, is a 63 y.o. male  OZD:[id number]  QVZ:[id number]  DOB - 11-18-1950  Chief Complaint  Patient presents with  . Follow-up        Subjective:   Nicolas Ray is a 63 y.o. male here today for a follow up visit. Patient is known to have hypertension, diabetes mellitus, and hypothyroidism. He is extremely noncompliant with medications, he takes medications only when he feels like, he claims he does not feel good after taking some other medications so he chose not to take them again. He has no complaint today, he only came to the clinic because this appointment was made. He denies chest pain. He has all his medications at home, he doesn't take them. He claims he is trying to do exercise and adhere strictly with diet to control his blood sugar and blood pressure. He does not test his blood sugar at home or blood pressure. He continues to smoke cigarette when he said he is trying to quit. Patient has No headache, No chest pain, No abdominal pain - No Nausea, No new weakness tingling or numbness, No Cough - SOB.  No problems updated.  ALLERGIES: No Known Allergies  PAST MEDICAL HISTORY: Past Medical History  Diagnosis Date  . Unspecified essential hypertension 02/08/2013  . Thyroid disease   . Depression   . Diabetes mellitus without complication     MEDICATIONS AT HOME: Prior to Admission medications   Medication Sig Start Date End Date Taking? Authorizing Provider  Alcohol Swabs 70 % PADS 1 application by Does not apply route daily. 06/15/13  Yes Nishant Dhungel, MD  amLODipine (NORVASC) 10 MG tablet Take 1 tablet (10 mg total) by mouth daily. 07/15/13  Yes Thurnell Lose, MD  levothyroxine (LEVOTHROID) 50 MCG tablet Take 1 tablet (50 mcg total) by mouth daily before breakfast. 06/15/13  Yes Nishant Dhungel, MD  lisinopril (PRINIVIL,ZESTRIL) 5 MG tablet Take 1 tablet (5 mg total) by mouth daily. 02/08/13   Yes Clanford Marisa Hua, MD  metformin (GLUCOPHAGE XR) 1000 MG (OSM) 24 hr tablet Take 1 po daily with breakfast for 1 week, then increase to 1 tablet with breakfast and 1 tablet with supper 07/15/13  Yes Thurnell Lose, MD  aspirin 81 MG tablet Take 81 mg by mouth daily.    Historical Provider, MD  Cholecalciferol (VITAMIN D-3) 5000 UNITS TABS Take by mouth daily.    Historical Provider, MD  diazepam (VALIUM) 5 MG tablet Take 1 tablet (5 mg total) by mouth every 8 (eight) hours as needed (Take 1-2 pills every 8 hours as needed for pain). 03/11/13   Noland Fordyce, PA-C  docusate sodium (COLACE) 100 MG capsule Take 1 capsule (100 mg total) by mouth 2 (two) times daily. 07/15/13   Thurnell Lose, MD  DULoxetine (CYMBALTA) 30 MG capsule Take 1 capsule (30 mg total) by mouth daily. 01/25/13   Reyne Dumas, MD  glucose blood (BL TEST STRIP PACK) test strip Use as instructed 06/15/13   Nishant Dhungel, MD  glucose monitoring kit (FREESTYLE) monitoring kit 1 each by Does not apply route as needed for other. 06/15/13   Nishant Dhungel, MD  HYDROcodone-acetaminophen (NORCO/VICODIN) 5-325 MG per tablet Take 1 tablet by mouth at bedtime as needed for pain. 03/15/13   Clanford Marisa Hua, MD  ibuprofen (ADVIL,MOTRIN) 600 MG tablet Take 1 tablet (600 mg total) by mouth every 6 (six) hours  as needed for pain. 03/11/13   Noland Fordyce, PA-C  Lancets (ACCU-CHEK MULTICLIX) lancets Use as instructed 06/15/13   Nishant Dhungel, MD  MOVIPREP 100 G SOLR moviprep as directed. No substitutions 09/19/13   Irene Shipper, MD  zolpidem (AMBIEN) 5 MG tablet Take 1 tablet (5 mg total) by mouth at bedtime as needed for sleep. 04/19/13   Angelica Chessman, MD     Objective:   Filed Vitals:   10/13/13 1047  BP: 173/100  Pulse: 70  Resp: 16  SpO2: 99%    Exam General appearance : Awake, alert, not in any distress. Speech Clear. Not toxic looking HEENT: Atraumatic and Normocephalic, pupils equally reactive to light and  accomodation Neck: supple, no JVD. No cervical lymphadenopathy.  Chest:Good air entry bilaterally, no added sounds  CVS: S1 S2 regular, no murmurs.  Abdomen: Bowel sounds present, Non tender and not distended with no gaurding, rigidity or rebound. Extremities: B/L Lower Ext shows no edema, both legs are warm to touch Neurology: Awake alert, and oriented X 3, CN II-XII intact, Non focal Skin:No Rash Wounds:N/A  Data Review  Assessment & Plan   1. Diabetes Patient was encouraged to restart his medication for diabetes He was counseled extensively on the need to control blood sugar and consequences of uncontrolled diabetes mellitus including kidney failure and coronary artery disease - HgB A1c - Glucose (CBG)  2. DM2 (diabetes mellitus, type 2)  - Microalbumin / creatinine urine ratio - Microalbumin, urine - COMPLETE METABOLIC PANEL WITH GFR  3. Unspecified hypothyroidism Patient encouraged to restart his medication for hypothyroidism - TSH - T4, Free  4. Essential hypertension, benign  Patient was encouraged to start the medications prescribed. He claims he has lots of them at home he just doesn't take them   Patient was educated extensively on hypertension, its complications and consequences of noncompliant with medication as well as consequences of uncontrolled hypertension. We discussed blood pressure goals. Patient verbalized understanding and claimed his behavior on medication side effects saying when he does not take the medications he feels good but when he takes them he does not feel as well. He did not mention any specific symptom of dizziness, chest pain, headache, nausea or vomiting.  Patient was counseled extensively on nutrition and exercise Patient was counseled extensively on smoking cessation   Return in about 3 months (around 01/10/2014), or if symptoms worsen or fail to improve.  The patient was given clear instructions to go to ER or return to medical center  if symptoms don't improve, worsen or new problems develop. The patient verbalized understanding. The patient was told to call to get lab results if they haven't heard anything in the next week.    Angelica Chessman, MD, [place], [place], [place] and [place], [place]   10/19/2013, 5:51 PM

## 2013-10-24 ENCOUNTER — Encounter: Payer: Self-pay | Admitting: Internal Medicine

## 2013-10-24 ENCOUNTER — Ambulatory Visit (AMBULATORY_SURGERY_CENTER): Payer: Self-pay | Admitting: Internal Medicine

## 2013-10-24 VITALS — BP 156/89 | HR 68 | Temp 98.2°F | Resp 20 | Ht 68.0 in | Wt 207.0 lb

## 2013-10-24 DIAGNOSIS — Z1211 Encounter for screening for malignant neoplasm of colon: Secondary | ICD-10-CM

## 2013-10-24 DIAGNOSIS — D126 Benign neoplasm of colon, unspecified: Secondary | ICD-10-CM

## 2013-10-24 MED ORDER — SODIUM CHLORIDE 0.9 % IV SOLN: 500.0000 mL | INTRAVENOUS | Status: DC

## 2013-10-24 NOTE — Patient Instructions (Signed)
YOU HAD AN ENDOSCOPIC PROCEDURE TODAY AT THE [facility]: Refer to the procedure report that was given to you for any specific questions about what was found during the examination.  If the procedure report does not answer your questions, please call your gastroenterologist to clarify.  If you requested that your care partner not be given the details of your procedure findings, then the procedure report has been included in a sealed envelope for you to review at your convenience later.  YOU SHOULD EXPECT: Some feelings of bloating in the abdomen. Passage of more gas than usual.  Walking can help get rid of the air that was put into your GI tract during the procedure and reduce the bloating. If you had a lower endoscopy (such as a colonoscopy or flexible sigmoidoscopy) you may notice spotting of blood in your stool or on the toilet paper. If you underwent a bowel prep for your procedure, then you may not have a normal bowel movement for a few days.  DIET: Your first meal following the procedure should be a light meal and then it is ok to progress to your normal diet.  A half-sandwich or bowl of soup is an example of a good first meal.  Heavy or fried foods are harder to digest and may make you feel nauseous or bloated.  Likewise meals heavy in dairy and vegetables can cause extra gas to form and this can also increase the bloating.  Drink plenty of fluids but you should avoid alcoholic beverages for 24 hours.  ACTIVITY: Your care partner should take you home directly after the procedure.  You should plan to take it easy, moving slowly for the rest of the day.  You can resume normal activity the day after the procedure however you should NOT DRIVE or use heavy machinery for 24 hours (because of the sedation medicines used during the test).    SYMPTOMS TO REPORT IMMEDIATELY: A gastroenterologist can be reached at any hour.  During normal business hours, 8:30 AM to 5:00 PM Monday through Friday,  call [phone].  After hours and on weekends, please call the GI answering service at [phone] who will take a message and have the physician on call contact you.   Following lower endoscopy (colonoscopy or flexible sigmoidoscopy):  Excessive amounts of blood in the stool  Significant tenderness or worsening of abdominal pains  Swelling of the abdomen that is new, acute  Fever of 100F or higher    FOLLOW UP: If any biopsies were taken you will be contacted by phone or by letter within the next 1-3 weeks.  Call your gastroenterologist if you have not heard about the biopsies in 3 weeks.  Our staff will call the home number listed on your records the next business day following your procedure to check on you and address any questions or concerns that you may have at that time regarding the information given to you following your procedure. This is a courtesy call and so if there is no answer at the home number and we have not heard from you through the emergency physician on call, we will assume that you have returned to your regular daily activities without incident.  SIGNATURES/CONFIDENTIALITY: You and/or your care partner have signed paperwork which will be entered into your electronic medical record.  These signatures attest to the fact that that the information above on your After Visit Summary has been reviewed and is understood.  Full responsibility of the confidentiality   this discharge information lies with you and/or your care-partner.   INFORMATION ON POLYPS GIVEN TO YOU TODAY 

## 2013-10-24 NOTE — Progress Notes (Signed)
Called to room to assist during endoscopic procedure.  Patient ID and intended procedure confirmed with present staff. Received instructions for my participation in the procedure from the performing physician.  

## 2013-10-24 NOTE — Progress Notes (Signed)
Procedure ends, to recovery, report given and VSS. 

## 2013-10-24 NOTE — Op Note (Signed)
[place]  [address] [place], [id number]   COLONOSCOPY PROCEDURE REPORT  PATIENT: Nicolas, Ray  MR#: [id number] BIRTHDATE: 04-21-1951 , 8  yrs. old GENDER: Male ENDOSCOPIST: Eustace Quail, MD REFERRED BY: Angelica Chessman, MD  [facility]) PROCEDURE DATE:  10/24/2013 PROCEDURE:   Colonoscopy with snare polypectomy x 3 First Screening Colonoscopy - Avg.  risk and is 50 yrs.  old or older Yes.  Prior Negative Screening - Now for repeat screening. N/A  History of Adenoma - Now for follow-up colonoscopy & has been > or = to 3 yrs.  N/A  Polyps Removed Today? Yes. ASA CLASS:   Class II INDICATIONS:average risk screening. MEDICATIONS: MAC sedation, administered by CRNA and propofol (Diprivan) 220mg  IV  DESCRIPTION OF PROCEDURE:   After the risks benefits and alternatives of the procedure were thoroughly explained, informed consent was obtained.  A digital rectal exam revealed no abnormalities of the rectum.   The LB [id number]  endoscope was introduced through the anus and advanced to the cecum, which was identified by both the appendix and ileocecal valve. No adverse events experienced.   The quality of the prep was excellent, using MoviPrep  The instrument was then slowly withdrawn as the colon was fully examined.  COLON FINDINGS: Three sessile polyps were found in the ascending (33mm), transverse (62mm), and descending (51mm) colon.  A polypectomy was performed with a cold snare.  The resection was complete and the polyp tissue was completely retrieved.   The colon mucosa was otherwise normal.  Retroflexed views revealed internal hemorrhoids. The time to cecum=1 minutes 53 seconds.  Withdrawal time=11 minutes 37 seconds.  The scope was withdrawn and the procedure completed. COMPLICATIONS: There were no complications.  ENDOSCOPIC IMPRESSION: 1.   Three sessile polyps were found in the colon; polypectomy was performed with a cold snare 2.    The colon mucosa was otherwise normal  RECOMMENDATIONS: 1. Follow up colonoscopy in 5 years   eSigned:  Eustace Quail, MD 10/24/2013 11:26 AM   cc: The Patient    ; [facility]

## 2013-10-25 ENCOUNTER — Telehealth: Payer: Self-pay

## 2013-10-25 NOTE — Telephone Encounter (Signed)
  Follow up Call-  Call back number 10/24/2013  Post procedure Call Back phone  # [phone]  Permission to leave phone message Yes     Patient questions:  Do you have a fever, pain , or abdominal swelling? no Pain Score  0 *  Have you tolerated food without any problems? yes  Have you been able to return to your normal activities? yes  Do you have any questions about your discharge instructions: Diet   no Medications  no Follow up visit  no  Do you have questions or concerns about your Care? no  Actions: * If pain score is 4 or above: No action needed, pain <4.

## 2013-10-27 ENCOUNTER — Encounter: Payer: Self-pay | Admitting: Internal Medicine

## 2014-01-12 ENCOUNTER — Ambulatory Visit: Payer: Self-pay | Admitting: Internal Medicine

## 2014-01-16 ENCOUNTER — Ambulatory Visit: Payer: No Typology Code available for payment source | Attending: Internal Medicine | Admitting: Internal Medicine

## 2014-01-16 ENCOUNTER — Encounter: Payer: Self-pay | Admitting: Internal Medicine

## 2014-01-16 VITALS — BP 146/93 | HR 81 | Temp 98.0°F | Resp 16 | Ht 68.0 in | Wt 203.0 lb

## 2014-01-16 DIAGNOSIS — E119 Type 2 diabetes mellitus without complications: Secondary | ICD-10-CM

## 2014-01-16 DIAGNOSIS — F329 Major depressive disorder, single episode, unspecified: Secondary | ICD-10-CM

## 2014-01-16 DIAGNOSIS — E039 Hypothyroidism, unspecified: Secondary | ICD-10-CM | POA: Insufficient documentation

## 2014-01-16 DIAGNOSIS — E079 Disorder of thyroid, unspecified: Secondary | ICD-10-CM | POA: Insufficient documentation

## 2014-01-16 DIAGNOSIS — M545 Low back pain, unspecified: Secondary | ICD-10-CM

## 2014-01-16 DIAGNOSIS — I10 Essential (primary) hypertension: Secondary | ICD-10-CM | POA: Insufficient documentation

## 2014-01-16 DIAGNOSIS — Z79899 Other long term (current) drug therapy: Secondary | ICD-10-CM | POA: Insufficient documentation

## 2014-01-16 DIAGNOSIS — Z7982 Long term (current) use of aspirin: Secondary | ICD-10-CM | POA: Insufficient documentation

## 2014-01-16 DIAGNOSIS — F3289 Other specified depressive episodes: Secondary | ICD-10-CM | POA: Insufficient documentation

## 2014-01-16 DIAGNOSIS — F17200 Nicotine dependence, unspecified, uncomplicated: Secondary | ICD-10-CM | POA: Insufficient documentation

## 2014-01-16 DIAGNOSIS — Z91199 Patient's noncompliance with other medical treatment and regimen due to unspecified reason: Secondary | ICD-10-CM | POA: Insufficient documentation

## 2014-01-16 DIAGNOSIS — Z9119 Patient's noncompliance with other medical treatment and regimen: Secondary | ICD-10-CM | POA: Insufficient documentation

## 2014-01-16 DIAGNOSIS — F32A Depression, unspecified: Secondary | ICD-10-CM

## 2014-01-16 DIAGNOSIS — N529 Male erectile dysfunction, unspecified: Secondary | ICD-10-CM | POA: Insufficient documentation

## 2014-01-16 LAB — POCT GLYCOSYLATED HEMOGLOBIN (HGB A1C): Hemoglobin A1C: 7.5

## 2014-01-16 LAB — GLUCOSE, POCT (MANUAL RESULT ENTRY): POC GLUCOSE: 176 mg/dL — AB (ref 70–99)

## 2014-01-16 MED ORDER — TADALAFIL 20 MG PO TABS: 10.0000 mg | ORAL_TABLET | ORAL | Status: DC | PRN

## 2014-01-16 MED ORDER — DULOXETINE HCL 30 MG PO CPEP: 30.0000 mg | ORAL_CAPSULE | Freq: Every day | ORAL | Status: DC

## 2014-01-16 NOTE — Patient Instructions (Addendum)
DASH Diet The DASH diet stands for "Dietary Approaches to Stop Hypertension." It is a healthy eating plan that has been shown to reduce high blood pressure (hypertension) in as little as 14 days, while also possibly providing other significant health benefits. These other health benefits include reducing the risk of breast cancer after menopause and reducing the risk of type 2 diabetes, heart disease, colon cancer, and stroke. Health benefits also include weight loss and slowing kidney failure in patients with chronic kidney disease.  DIET GUIDELINES  Limit salt (sodium). Your diet should contain less than 1500 mg of sodium daily.  Limit refined or processed carbohydrates. Your diet should include mostly whole grains. Desserts and added sugars should be used sparingly.  Include small amounts of heart-healthy fats. These types of fats include nuts, oils, and tub margarine. Limit saturated and trans fats. These fats have been shown to be harmful in the body. CHOOSING FOODS  The following food groups are based on a 2000 calorie diet. See your Registered Dietitian for individual calorie needs. Grains and Grain Products (6 to 8 servings daily)  Eat More Often: Whole-wheat bread, brown rice, whole-grain or wheat pasta, quinoa, popcorn without added fat or salt (air popped).  Eat Less Often: White bread, white pasta, white rice, cornbread. Vegetables (4 to 5 servings daily)  Eat More Often: Fresh, frozen, and canned vegetables. Vegetables may be raw, steamed, roasted, or grilled with a minimal amount of fat.  Eat Less Often/Avoid: Creamed or fried vegetables. Vegetables in a cheese sauce. Fruit (4 to 5 servings daily)  Eat More Often: All fresh, canned (in natural juice), or frozen fruits. Dried fruits without added sugar. One hundred percent fruit juice ( cup [237 mL] daily).  Eat Less Often: Dried fruits with added sugar. Canned fruit in light or heavy syrup. [employer], Fish, and Poultry (2  servings or less daily. One serving is 3 to 4 oz [85-114 g]).  Eat More Often: Ninety percent or leaner ground beef, tenderloin, sirloin. Round cuts of beef, chicken breast, [place] breast. All fish. Grill, bake, or broil your meat. Nothing should be fried.  Eat Less Often/Avoid: Fatty cuts of meat, [place], or chicken leg, thigh, or wing. Fried cuts of meat or fish. Dairy (2 to 3 servings)  Eat More Often: Low-fat or fat-free milk, low-fat plain or light yogurt, reduced-fat or part-skim cheese.  Eat Less Often/Avoid: Milk (whole, 2%).Whole milk yogurt. Full-fat cheeses. Nuts, Seeds, and Legumes (4 to 5 servings per week)  Eat More Often: All without added salt.  Eat Less Often/Avoid: Salted nuts and seeds, canned beans with added salt. Fats and Sweets (limited)  Eat More Often: Vegetable oils, tub margarines without trans fats, sugar-free gelatin. Mayonnaise and salad dressings.  Eat Less Often/Avoid: Coconut oils, palm oils, butter, stick margarine, cream, half and half, cookies, candy, pie. FOR MORE INFORMATION The Dash Diet Eating Plan: www.dashdiet.org Document Released: 08/07/2011 Document Revised: 11/10/2011 Document Reviewed: 08/07/2011 [facility] Patient Information 2014 [place], [place]. Duloxetine delayed-release capsules What is this medicine? DULOXETINE (doo LOX e teen) is an antidepressant. It is used to treat depression. It is also used to treat different types of chronic pain. This medicine may be used for other purposes; ask your health care provider or pharmacist if you have questions. COMMON BRAND NAME(S): Cymbalta What should I tell my health care provider before I take this medicine? They need to know if you have any of these conditions: -bipolar disorder or a family history of bipolar disorder -  glaucoma -kidney disease -liver disease -suicidal thoughts or a previous suicide attempt -taken medicines called MAOIs like Carbex, Eldepryl, Marplan, Nardil, and Parnate  within 14 days -an unusual reaction to duloxetine, other medicines, foods, dyes, or preservatives -pregnant or trying to get pregnant -breast-feeding How should I use this medicine? Take this medicine by mouth with a glass of water. Follow the directions on the prescription label. Do not cut, crush or chew this medicine. You can take this medicine with or without food. Take your medicine at regular intervals. Do not take your medicine more often than directed. Do not stop taking this medicine suddenly except upon the advice of your doctor. Stopping this medicine too quickly may cause serious side effects or your condition may worsen. A special MedGuide will be given to you by the pharmacist with each prescription and refill. Be sure to read this information carefully each time. Talk to your pediatrician regarding the use of this medicine in children. Special care may be needed. Overdosage: If you think you have taken too much of this medicine contact a poison control center or emergency room at once. NOTE: This medicine is only for you. Do not share this medicine with others. What if I miss a dose? If you miss a dose, take it as soon as you can. If it is almost time for your next dose, take only that dose. Do not take double or extra doses. What may interact with this medicine? Do not take this medicine with any of the following medications: -certain diet drugs like dexfenfluramine, fenfluramine -desvenlafaxine -linezolid -MAOIs like Azilect, Carbex, Eldepryl, Marplan, Nardil, and Parnate -methylene blue (intravenous) -milnacipran -thioridazine -venlafaxine This medicine may also interact with the following medications: -alcohol -aspirin and aspirin-like medicines -certain antibiotics like ciprofloxacin and enoxacin -certain medicines for blood pressure, heart disease, irregular heart beat -certain medicines for depression, anxiety, or psychotic disturbances -certain medicines for  migraine headache like almotriptan, eletriptan, frovatriptan, naratriptan, rizatriptan, sumatriptan, zolmitriptan -certain medicines that treat or prevent blood clots like warfarin, enoxaparin, and dalteparin -cimetidine -fentanyl -lithium -NSAIDS, medicines for pain and inflammation, like ibuprofen or naproxen -phentermine -procarbazine -sibutramine -St. John's wort -theophylline -tramadol -tryptophan This list may not describe all possible interactions. Give your health care provider a list of all the medicines, herbs, non-prescription drugs, or dietary supplements you use. Also tell them if you smoke, drink alcohol, or use illegal drugs. Some items may interact with your medicine. What should I watch for while using this medicine? Tell your doctor if your symptoms do not get better or if they get worse. Visit your doctor or health care professional for regular checks on your progress. Because it may take several weeks to see the full effects of this medicine, it is important to continue your treatment as prescribed by your doctor. Patients and their families should watch out for new or worsening thoughts of suicide or depression. Also watch out for sudden changes in feelings such as feeling anxious, agitated, panicky, irritable, hostile, aggressive, impulsive, severely restless, overly excited and hyperactive, or not being able to sleep. If this happens, especially at the beginning of treatment or after a change in dose, call your health care professional. Dennis Bast may get drowsy or dizzy. Do not drive, use machinery, or do anything that needs mental alertness until you know how this medicine affects you. Do not stand or sit up quickly, especially if you are an older patient. This reduces the risk of dizzy or fainting spells. Alcohol may interfere with  the effect of this medicine. Avoid alcoholic drinks. This medicine can cause an increase in blood pressure. Check with your doctor for instructions on  monitoring your blood pressure while taking this medicine. Your mouth may get dry. Chewing sugarless gum or sucking hard candy, and drinking plenty of water may help. Contact your doctor if the problem does not go away or is severe. What side effects may I notice from receiving this medicine? Side effects that you should report to your doctor or health care professional as soon as possible: -allergic reactions like skin rash, itching or hives, swelling of the face, lips, or tongue -changes in blood pressure -confusion -dark urine -dizziness -fast talking and excited feelings or actions that are out of control -fast, irregular heartbeat -fever -general ill feeling or flu-like symptoms -hallucination, loss of contact with reality -light-colored stools -loss of balance or coordination -redness, blistering, peeling or loosening of the skin, including inside the mouth -right upper belly pain -seizures -suicidal thoughts or other mood changes -trouble concentrating -trouble passing urine or change in the amount of urine -unusual bleeding or bruising -unusually weak or tired -yellowing of the eyes or skin Side effects that usually do not require medical attention (report to your doctor or health care professional if they continue or are bothersome): -blurred vision -change in appetite -change in sex drive or performance -headache -increased sweating -nausea This list may not describe all possible side effects. Call your doctor for medical advice about side effects. You may report side effects to FDA at 1-800-FDA-1088. Where should I keep my medicine? Keep out of the reach of children. Store at room temperature between 15 and 30 degrees C (59 and 86 degrees F). Throw away any unused medicine after the expiration date. NOTE: This sheet is a summary. It may not cover all possible information. If you have questions about this medicine, talk to your doctor, pharmacist, or health care  provider.  2014, Elsevier/[employer] Standard. (2013-03-11 13:05:23)

## 2014-01-16 NOTE — Progress Notes (Signed)
Patient ID: Nicolas Ray, male   DOB: 06-11-1951, 63 y.o.   MRN: [id number]   Quan Cybulski, is a 63 y.o. male  [id number]  [id number]  DOB - [birth year]  Chief Complaint  Patient presents with  . Follow-up        Subjective:   Nicolas Ray is a 63 y.o. male here today for a follow up visit. Patient is known to have hypertension, hypothyroidism, diabetes, and depression. He is extremely noncompliant with medications because he thinks that his medications are causing his erectile dysfunction. He has not been able to discuss this issue because he feels embarrassed to mention ED. he takes his medications only occasionally, sometimes one week on, one week off, sometimes alternate day depending on how he feels. He still feels low and  weak with low energy. He feels depressed, he ran out of Cymbalta long time ago. He smokes about a couple of a pack cigarette per day, he does not drink alcohol. He needs help on his erection, if that is taken care of, then he may take his medications. Patient has No headache, No chest pain, No abdominal pain - No Nausea, No new weakness tingling or numbness, No Cough - SOB.  No problems updated.  ALLERGIES: No Known Allergies  PAST MEDICAL HISTORY: Past Medical History  Diagnosis Date  . Unspecified essential hypertension 02/08/2013  . Thyroid disease   . Depression   . Diabetes mellitus without complication     MEDICATIONS AT HOME: Prior to Admission medications   Medication Sig Start Date End Date Taking? Authorizing Provider  Alcohol Swabs 70 % PADS 1 application by Does not apply route daily. 06/15/13  Yes Nishant Dhungel, MD  amLODipine (NORVASC) 10 MG tablet Take 1 tablet (10 mg total) by mouth daily. 07/15/13  Yes Thurnell Lose, MD  aspirin 81 MG tablet Take 81 mg by mouth daily.   Yes Historical Provider, MD  diazepam (VALIUM) 5 MG tablet Take 1 tablet (5 mg total) by mouth every 8 (eight) hours as needed (Take 1-2 pills every 8 hours as needed for  pain). 03/11/13  Yes Noland Fordyce, PA-C  DULoxetine (CYMBALTA) 30 MG capsule Take 1 capsule (30 mg total) by mouth daily. 01/16/14  Yes Angelica Chessman, MD  glucose blood (BL TEST STRIP PACK) test strip Use as instructed 06/15/13  Yes Nishant Dhungel, MD  glucose monitoring kit (FREESTYLE) monitoring kit 1 each by Does not apply route as needed for other. 06/15/13  Yes Nishant Dhungel, MD  Lancets (ACCU-CHEK MULTICLIX) lancets Use as instructed 06/15/13  Yes Nishant Dhungel, MD  levothyroxine (LEVOTHROID) 50 MCG tablet Take 1 tablet (50 mcg total) by mouth daily before breakfast. 06/15/13  Yes Nishant Dhungel, MD  lisinopril (PRINIVIL,ZESTRIL) 5 MG tablet Take 1 tablet (5 mg total) by mouth daily. 02/08/13  Yes Clanford Marisa Hua, MD  metformin (GLUCOPHAGE XR) 1000 MG (OSM) 24 hr tablet Take 1 po daily with breakfast for 1 week, then increase to 1 tablet with breakfast and 1 tablet with supper 07/15/13  Yes Thurnell Lose, MD  zolpidem (AMBIEN) 5 MG tablet Take 1 tablet (5 mg total) by mouth at bedtime as needed for sleep. 04/19/13  Yes Angelica Chessman, MD  Cholecalciferol (VITAMIN D-3) 5000 UNITS TABS Take by mouth daily.    Historical Provider, MD  docusate sodium (COLACE) 100 MG capsule Take 1 capsule (100 mg total) by mouth 2 (two) times daily. 07/15/13   Thurnell Lose, MD  HYDROcodone-acetaminophen (NORCO/VICODIN) 5-325 MG  per tablet Take 1 tablet by mouth at bedtime as needed for pain. 03/15/13   Clanford Marisa Hua, MD  ibuprofen (ADVIL,MOTRIN) 600 MG tablet Take 1 tablet (600 mg total) by mouth every 6 (six) hours as needed for pain. 03/11/13   Noland Fordyce, PA-C  tadalafil (CIALIS) 20 MG tablet Take 0.5-1 tablets (10-20 mg total) by mouth every other day as needed for erectile dysfunction. 01/16/14   Angelica Chessman, MD     Objective:   Filed Vitals:   01/16/14 1106  BP: 146/93  Pulse: 81  Temp: 98 F (36.7 C)  TempSrc: Oral  Resp: 16  Height: 5' 8"  (1.727 m)  Weight: 203 lb  (92.08 kg)  SpO2: 95%    Exam General appearance : Awake, alert, not in any distress. Speech Clear. Not toxic looking, obese HEENT: Atraumatic and Normocephalic, pupils equally reactive to light and accomodation Neck: supple, no JVD. No cervical lymphadenopathy.  Chest:Good air entry bilaterally, no added sounds  CVS: S1 S2 regular, no murmurs.  Abdomen: Bowel sounds present, Non tender and not distended with no gaurding, rigidity or rebound. Extremities: B/L Lower Ext shows no edema, both legs are warm to touch Neurology: Awake alert, and oriented X 3, CN II-XII intact, Non focal Skin:No Rash Wounds:N/A  Data Review Lab Results  Component Value Date   HGBA1C 7.5 01/16/2014   HGBA1C 7.4 10/13/2013   HGBA1C 7.5 07/15/2013     Assessment & Plan   1. Diabetes  - Glucose (CBG) - HgB A1c is 7.5% today  2. Essential hypertension, benign Patient was counseled on nutrition and exercise Dash Diet  3. Depression  - DULoxetine (CYMBALTA) 30 MG capsule; Take 1 capsule (30 mg total) by mouth daily.  Dispense: 30 capsule; Refill: 3  Patient also complained of erectile dysfunction and he thinks medications are the cause of his ED, he would take the medications if he has something to improve his erectile function. He was given prescription for Cialis. Patient is very shy and feels a little embarrassed about this discussion, he does not want erectile dysfunction on his medical records because he thinks he may be udged based on that.  Patient was counseled extensively about compliance with medication Patient was counseled extensively about smoking cessation  Return in about 3 months (around 04/18/2014), or if symptoms worsen or fail to improve, for Hemoglobin A1C and Follow up, DM, Follow up HTN.  The patient was given clear instructions to go to ER or return to medical center if symptoms don't improve, worsen or new problems develop. The patient verbalized understanding. The patient was  told to call to get lab results if they haven't heard anything in the next week.   This note has been created with Surveyor, quantity. Any transcriptional errors are unintentional.    Angelica Chessman, MD, [place], [place], [place] and [facility] [place], [place]   01/16/2014, 12:04 PM

## 2014-01-16 NOTE — Progress Notes (Signed)
Pt is here following up on his diabetes and HTN. Pt has no C.C. Today.

## 2014-01-17 ENCOUNTER — Ambulatory Visit: Payer: No Typology Code available for payment source | Attending: Internal Medicine

## 2014-04-18 ENCOUNTER — Ambulatory Visit: Payer: No Typology Code available for payment source | Attending: Internal Medicine | Admitting: Internal Medicine

## 2014-04-18 ENCOUNTER — Encounter: Payer: Self-pay | Admitting: Internal Medicine

## 2014-04-18 VITALS — BP 159/86 | HR 85 | Temp 98.4°F | Resp 20 | Ht 68.0 in | Wt 204.0 lb

## 2014-04-18 DIAGNOSIS — F329 Major depressive disorder, single episode, unspecified: Secondary | ICD-10-CM | POA: Insufficient documentation

## 2014-04-18 DIAGNOSIS — IMO0001 Reserved for inherently not codable concepts without codable children: Secondary | ICD-10-CM

## 2014-04-18 DIAGNOSIS — E039 Hypothyroidism, unspecified: Secondary | ICD-10-CM | POA: Insufficient documentation

## 2014-04-18 DIAGNOSIS — N529 Male erectile dysfunction, unspecified: Secondary | ICD-10-CM | POA: Insufficient documentation

## 2014-04-18 DIAGNOSIS — E1165 Type 2 diabetes mellitus with hyperglycemia: Secondary | ICD-10-CM

## 2014-04-18 DIAGNOSIS — I10 Essential (primary) hypertension: Secondary | ICD-10-CM | POA: Insufficient documentation

## 2014-04-18 DIAGNOSIS — N528 Other male erectile dysfunction: Secondary | ICD-10-CM

## 2014-04-18 DIAGNOSIS — Z7982 Long term (current) use of aspirin: Secondary | ICD-10-CM | POA: Insufficient documentation

## 2014-04-18 DIAGNOSIS — E119 Type 2 diabetes mellitus without complications: Secondary | ICD-10-CM | POA: Insufficient documentation

## 2014-04-18 DIAGNOSIS — F3289 Other specified depressive episodes: Secondary | ICD-10-CM | POA: Insufficient documentation

## 2014-04-18 LAB — POCT CBG (FASTING - GLUCOSE)-MANUAL ENTRY: GLUCOSE FASTING, POC: 167 mg/dL — AB (ref 70–99)

## 2014-04-18 LAB — POCT GLYCOSYLATED HEMOGLOBIN (HGB A1C): Hemoglobin A1C: 7.4

## 2014-04-18 MED ORDER — TADALAFIL 20 MG PO TABS: 10.0000 mg | ORAL_TABLET | ORAL | Status: DC | PRN

## 2014-04-18 NOTE — Patient Instructions (Signed)
Diabetes and Exercise Exercising regularly is important. It is not just about losing weight. It has many health benefits, such as:  Improving your overall fitness, flexibility, and endurance.  Increasing your bone density.  Helping with weight control.  Decreasing your body fat.  Increasing your muscle strength.  Reducing stress and tension.  Improving your overall health. People with diabetes who exercise gain additional benefits because exercise:  Reduces appetite.  Improves the body's use of blood sugar (glucose).  Helps lower or control blood glucose.  Decreases blood pressure.  Helps control blood lipids (such as cholesterol and triglycerides).  Improves the body's use of the hormone insulin by:  Increasing the body's insulin sensitivity.  Reducing the body's insulin needs.  Decreases the risk for heart disease because exercising:  Lowers cholesterol and triglycerides levels.  Increases the levels of good cholesterol (such as high-density lipoproteins [HDL]) in the body.  Lowers blood glucose levels. YOUR ACTIVITY PLAN  Choose an activity that you enjoy and set realistic goals. Your health care provider or diabetes educator can help you make an activity plan that works for you. Exercise regularly as directed by your health care provider. This includes:  Performing resistance training twice a week such as push-ups, sit-ups, lifting weights, or using resistance bands.  Performing 150 minutes of cardio exercises each week such as walking, running, or playing sports.  Staying active and spending no more than 90 minutes at one time being inactive. Even short bursts of exercise are good for you. Three 10-minute sessions spread throughout the day are just as beneficial as a single 30-minute session. Some exercise ideas include:  Taking the dog for a walk.  Taking the stairs instead of the elevator.  Dancing to your favorite song.  Doing an exercise  video.  Doing your favorite exercise with a friend. RECOMMENDATIONS FOR EXERCISING WITH TYPE 1 OR TYPE 2 DIABETES   Check your blood glucose before exercising. If blood glucose levels are greater than 240 mg/dL, check for urine ketones. Do not exercise if ketones are present.  Avoid injecting insulin into areas of the body that are going to be exercised. For example, avoid injecting insulin into:  The arms when playing tennis.  The legs when jogging.  Keep a record of:  Food intake before and after you exercise.  Expected peak times of insulin action.  Blood glucose levels before and after you exercise.  The type and amount of exercise you have done.  Review your records with your health care provider. Your health care provider will help you to develop guidelines for adjusting food intake and insulin amounts before and after exercising.  If you take insulin or oral hypoglycemic agents, watch for signs and symptoms of hypoglycemia. They include:  Dizziness.  Shaking.  Sweating.  Chills.  Confusion.  Drink plenty of water while you exercise to prevent dehydration or heat stroke. Body water is lost during exercise and must be replaced.  Talk to your health care provider before starting an exercise program to make sure it is safe for you. Remember, almost any type of activity is better than none. Document Released: 11/08/2003 Document Revised: 01/02/2014 Document Reviewed: 01/25/2013 [employer] Patient Information 2015 [employer]. This information is not intended to replace advice given to you by your health care provider. Make sure you discuss any questions you have with your health care provider.  

## 2014-04-18 NOTE — Progress Notes (Signed)
Patient ID: Nicolas Ray, male   DOB: 10/16/1950, 63 y.o.   MRN: [id number]   Nicolas Ray, is a 63 y.o. male  GYF:[id number]  FFM:[id number]  DOB - 21-Sep-1950  Chief Complaint  Patient presents with  . Follow-up  . Diabetes  . Hypertension        Subjective:   Nicolas Ray is a 63 y.o. male here today for a follow up visit. Patient is known to have hypertension, hypothyroidism, diabetes, and depression. He is extremely noncompliant with medications because he thinks that his medications are causing his erectile dysfunction, he takes his medications only occasionally, sometimes one week on, one week off, sometimes alternate day depending on how he feels. He smokes about a couple of a pack cigarette per day, he does not drink alcohol. He needs help on his erection, if that is taken care of, then he may take his medications. He did not filll the previous prescription because of cost but now he thinks he has enough to fill the prescription. He does not check his blood sugar or blood pressure at home. He has not taken his blood pressure medicine in 10 days. He however has no significant complaint today. He had a bug bite on his right leg about 2 weeks ago, still itchy but getting better. No sign of infection, no fever or drainage from the site. He does not need refill of his medications. Patient has No headache, No chest pain, No abdominal pain - No Nausea, No new weakness tingling or numbness, No Cough - SOB.  Problem  Other Male Erectile Dysfunction    ALLERGIES: No Known Allergies  PAST MEDICAL HISTORY: Past Medical History  Diagnosis Date  . Unspecified essential hypertension 02/08/2013  . Thyroid disease   . Depression   . Diabetes mellitus without complication     MEDICATIONS AT HOME: Prior to Admission medications   Medication Sig Start Date End Date Taking? Authorizing Provider  amLODipine (NORVASC) 10 MG tablet Take 1 tablet (10 mg total) by mouth daily. 07/15/13  Yes Thurnell Lose,  MD  aspirin 81 MG tablet Take 81 mg by mouth daily.   Yes Historical Provider, MD  Cholecalciferol (VITAMIN D-3) 5000 UNITS TABS Take by mouth daily.   Yes Historical Provider, MD  diazepam (VALIUM) 5 MG tablet Take 1 tablet (5 mg total) by mouth every 8 (eight) hours as needed (Take 1-2 pills every 8 hours as needed for pain). 03/11/13  Yes Noland Fordyce, PA-C  docusate sodium (COLACE) 100 MG capsule Take 1 capsule (100 mg total) by mouth 2 (two) times daily. 07/15/13  Yes Thurnell Lose, MD  DULoxetine (CYMBALTA) 30 MG capsule Take 1 capsule (30 mg total) by mouth daily. 01/16/14  Yes Angelica Chessman, MD  levothyroxine (LEVOTHROID) 50 MCG tablet Take 1 tablet (50 mcg total) by mouth daily before breakfast. 06/15/13  Yes Nishant Dhungel, MD  lisinopril (PRINIVIL,ZESTRIL) 5 MG tablet Take 1 tablet (5 mg total) by mouth daily. 02/08/13  Yes Clanford Marisa Hua, MD  metformin (GLUCOPHAGE XR) 1000 MG (OSM) 24 hr tablet Take 1 po daily with breakfast for 1 week, then increase to 1 tablet with breakfast and 1 tablet with supper 07/15/13  Yes Thurnell Lose, MD  zolpidem (AMBIEN) 5 MG tablet Take 1 tablet (5 mg total) by mouth at bedtime as needed for sleep. 04/19/13  Yes Angelica Chessman, MD  Alcohol Swabs 70 % PADS 1 application by Does not apply route daily. 06/15/13   Nishant Dhungel,  MD  glucose blood (BL TEST STRIP PACK) test strip Use as instructed 06/15/13   Nishant Dhungel, MD  glucose monitoring kit (FREESTYLE) monitoring kit 1 each by Does not apply route as needed for other. 06/15/13   Nishant Dhungel, MD  HYDROcodone-acetaminophen (NORCO/VICODIN) 5-325 MG per tablet Take 1 tablet by mouth at bedtime as needed for pain. 03/15/13   Clanford Marisa Hua, MD  ibuprofen (ADVIL,MOTRIN) 600 MG tablet Take 1 tablet (600 mg total) by mouth every 6 (six) hours as needed for pain. 03/11/13   Noland Fordyce, PA-C  Lancets (ACCU-CHEK MULTICLIX) lancets Use as instructed 06/15/13   Nishant Dhungel, MD    tadalafil (CIALIS) 20 MG tablet Take 0.5-1 tablets (10-20 mg total) by mouth every other day as needed for erectile dysfunction. 04/18/14   Angelica Chessman, MD     Objective:   Filed Vitals:   04/18/14 1100  BP: 159/86  Pulse: 85  Temp: 98.4 F (36.9 C)  TempSrc: Oral  Resp: 20  Height: 5' 8"  (1.727 m)  Weight: 92.534 kg (204 lb)  SpO2: 97%    Exam General appearance : Awake, alert, not in any distress. Speech Clear. Not toxic looking HEENT: Atraumatic and Normocephalic, pupils equally reactive to light and accomodation Neck: supple, no JVD. No cervical lymphadenopathy.  Chest:Good air entry bilaterally, no added sounds  CVS: S1 S2 regular, no murmurs.  Abdomen: Bowel sounds present, Non tender and not distended with no gaurding, rigidity or rebound. Extremities: B/L Lower Ext shows no edema, both legs are warm to touch Neurology: Awake alert, and oriented X 3, CN II-XII intact, Non focal Skin:No Rash Wounds:N/A  Data Review Lab Results  Component Value Date   HGBA1C 7.4 04/18/2014   HGBA1C 7.5 01/16/2014   HGBA1C 7.4 10/13/2013    Assessment & Plan   1. Type II or unspecified type diabetes mellitus without mention of complication, uncontrolled  - Glucose (CBG), Fasting - HgB A1c is 7.4% today, not significantly different from 3 months ago  2. Unspecified hypothyroidism Continue levothyroxine at the current dose  3. Essential hypertension, benign Continue amlodipine 10 mg tablet by mouth daily Patient was again extensively counseled to continue to be compliant with medication and take it daily as prescribed Blood pressure goals were discussed and the consequences of uncontrolled hypertension discussed with patient  Patient verbalized understanding DASH diet  4. Other male erectile dysfunction  - tadalafil (CIALIS) 20 MG tablet; Take 0.5-1 tablets (10-20 mg total) by mouth every other day as needed for erectile dysfunction.  Dispense: 30 tablet; Refill: 3 Do  not take Cialis with your blood pressure medication  Patient was extensively counseled on nutrition and exercise  Return in about 3 months (around 07/19/2014), or if symptoms worsen or fail to improve, for Hemoglobin A1C and Follow up, DM, Follow up HTN.  The patient was given clear instructions to go to ER or return to medical center if symptoms don't improve, worsen or new problems develop. The patient verbalized understanding. The patient was told to call to get lab results if they haven't heard anything in the next week.   This note has been created with Surveyor, quantity. Any transcriptional errors are unintentional.    Angelica Chessman, MD, [place], [place], [place] and [facility], [place] [facility]   04/18/2014, 11:20 AM

## 2014-04-18 NOTE — Progress Notes (Signed)
Patient presents for f/u on DM and HTN States he is not checking CBG at home States he is taking all meds one week and not the next because they make him "sluggish". C/O insect bug right leg 2 weeks ago that continues to itch. He states he scratches it  and peels scab off. Discussed avoiding breaking skin open with fingernails. BP 159/86 States he has not taken any meds in 10 days

## 2014-05-01 ENCOUNTER — Ambulatory Visit: Payer: No Typology Code available for payment source

## 2015-04-18 ENCOUNTER — Encounter (HOSPITAL_COMMUNITY)
Admission: EM | Disposition: A | Discharge status: Home / Self Care | Payer: Self-pay | Source: Home / Self Care | Attending: Cardiovascular Disease

## 2015-04-18 ENCOUNTER — Inpatient Hospital Stay (HOSPITAL_COMMUNITY)
Admission: EM | Admit: 2015-04-18 | Discharge: 2015-04-24 | DRG: 246 | Disposition: A | Discharge status: Home / Self Care | Payer: 59 | Attending: Cardiovascular Disease | Admitting: Cardiovascular Disease

## 2015-04-18 ENCOUNTER — Encounter (HOSPITAL_COMMUNITY): Payer: Self-pay | Admitting: Emergency Medicine

## 2015-04-18 DIAGNOSIS — Z7982 Long term (current) use of aspirin: Secondary | ICD-10-CM | POA: Diagnosis not present

## 2015-04-18 DIAGNOSIS — E038 Other specified hypothyroidism: Secondary | ICD-10-CM | POA: Diagnosis not present

## 2015-04-18 DIAGNOSIS — F17210 Nicotine dependence, cigarettes, uncomplicated: Secondary | ICD-10-CM | POA: Diagnosis present

## 2015-04-18 DIAGNOSIS — E1165 Type 2 diabetes mellitus with hyperglycemia: Secondary | ICD-10-CM | POA: Diagnosis present

## 2015-04-18 DIAGNOSIS — I2129 ST elevation (STEMI) myocardial infarction involving other sites: Secondary | ICD-10-CM

## 2015-04-18 DIAGNOSIS — N179 Acute kidney failure, unspecified: Secondary | ICD-10-CM | POA: Diagnosis not present

## 2015-04-18 DIAGNOSIS — I129 Hypertensive chronic kidney disease with stage 1 through stage 4 chronic kidney disease, or unspecified chronic kidney disease: Secondary | ICD-10-CM | POA: Diagnosis present

## 2015-04-18 DIAGNOSIS — I2510 Atherosclerotic heart disease of native coronary artery without angina pectoris: Secondary | ICD-10-CM

## 2015-04-18 DIAGNOSIS — N189 Chronic kidney disease, unspecified: Secondary | ICD-10-CM | POA: Diagnosis present

## 2015-04-18 DIAGNOSIS — I25810 Atherosclerosis of coronary artery bypass graft(s) without angina pectoris: Secondary | ICD-10-CM | POA: Insufficient documentation

## 2015-04-18 DIAGNOSIS — E079 Disorder of thyroid, unspecified: Secondary | ICD-10-CM | POA: Diagnosis present

## 2015-04-18 DIAGNOSIS — I5021 Acute systolic (congestive) heart failure: Secondary | ICD-10-CM | POA: Diagnosis present

## 2015-04-18 DIAGNOSIS — Z79899 Other long term (current) drug therapy: Secondary | ICD-10-CM

## 2015-04-18 DIAGNOSIS — N141 Nephropathy induced by other drugs, medicaments and biological substances: Secondary | ICD-10-CM | POA: Diagnosis not present

## 2015-04-18 DIAGNOSIS — I255 Ischemic cardiomyopathy: Secondary | ICD-10-CM | POA: Diagnosis present

## 2015-04-18 DIAGNOSIS — E1121 Type 2 diabetes mellitus with diabetic nephropathy: Secondary | ICD-10-CM

## 2015-04-18 DIAGNOSIS — R079 Chest pain, unspecified: Secondary | ICD-10-CM | POA: Diagnosis present

## 2015-04-18 DIAGNOSIS — T508X5A Adverse effect of diagnostic agents, initial encounter: Secondary | ICD-10-CM | POA: Diagnosis not present

## 2015-04-18 DIAGNOSIS — I25110 Atherosclerotic heart disease of native coronary artery with unstable angina pectoris: Secondary | ICD-10-CM | POA: Diagnosis present

## 2015-04-18 DIAGNOSIS — E1140 Type 2 diabetes mellitus with diabetic neuropathy, unspecified: Secondary | ICD-10-CM

## 2015-04-18 DIAGNOSIS — E118 Type 2 diabetes mellitus with unspecified complications: Secondary | ICD-10-CM

## 2015-04-18 DIAGNOSIS — E785 Hyperlipidemia, unspecified: Secondary | ICD-10-CM | POA: Diagnosis present

## 2015-04-18 DIAGNOSIS — Z955 Presence of coronary angioplasty implant and graft: Secondary | ICD-10-CM

## 2015-04-18 DIAGNOSIS — E039 Hypothyroidism, unspecified: Secondary | ICD-10-CM | POA: Diagnosis present

## 2015-04-18 DIAGNOSIS — E1122 Type 2 diabetes mellitus with diabetic chronic kidney disease: Secondary | ICD-10-CM | POA: Diagnosis present

## 2015-04-18 DIAGNOSIS — I2102 ST elevation (STEMI) myocardial infarction involving left anterior descending coronary artery: Secondary | ICD-10-CM

## 2015-04-18 DIAGNOSIS — I10 Essential (primary) hypertension: Secondary | ICD-10-CM | POA: Diagnosis not present

## 2015-04-18 DIAGNOSIS — F329 Major depressive disorder, single episode, unspecified: Secondary | ICD-10-CM | POA: Diagnosis present

## 2015-04-18 DIAGNOSIS — I5022 Chronic systolic (congestive) heart failure: Secondary | ICD-10-CM

## 2015-04-18 DIAGNOSIS — Z8249 Family history of ischemic heart disease and other diseases of the circulatory system: Secondary | ICD-10-CM | POA: Diagnosis not present

## 2015-04-18 DIAGNOSIS — I213 ST elevation (STEMI) myocardial infarction of unspecified site: Secondary | ICD-10-CM

## 2015-04-18 HISTORY — DX: Atherosclerotic heart disease of native coronary artery without angina pectoris: I25.10

## 2015-04-18 HISTORY — DX: ST elevation (STEMI) myocardial infarction involving other sites: I21.29

## 2015-04-18 HISTORY — PX: CARDIAC CATHETERIZATION: SHX172

## 2015-04-18 LAB — BASIC METABOLIC PANEL
ANION GAP: 10 (ref 5–15)
BUN: 21 mg/dL — AB (ref 6–20)
CO2: 22 mmol/L (ref 22–32)
Calcium: 9.4 mg/dL (ref 8.9–10.3)
Chloride: 103 mmol/L (ref 101–111)
Creatinine, Ser: 1.27 mg/dL — ABNORMAL HIGH (ref 0.61–1.24)
GFR calc non Af Amer: 58 mL/min — ABNORMAL LOW (ref >60)
Glucose, Bld: 205 mg/dL — ABNORMAL HIGH (ref 65–99)
Potassium: 4.3 mmol/L (ref 3.5–5.1)
SODIUM: 135 mmol/L (ref 135–145)

## 2015-04-18 LAB — BRAIN NATRIURETIC PEPTIDE: B NATRIURETIC PEPTIDE 5: 49.9 pg/mL (ref 0.0–100.0)

## 2015-04-18 LAB — I-STAT TROPONIN, ED: Troponin i, poc: 0.09 ng/mL (ref 0.00–0.08)

## 2015-04-18 LAB — TROPONIN I: Troponin I: 39.96 ng/mL (ref <0.031)

## 2015-04-18 LAB — CBC
HEMATOCRIT: 39.8 % (ref 39.0–52.0)
Hemoglobin: 13.2 g/dL (ref 13.0–17.0)
MCH: 22.4 pg — AB (ref 26.0–34.0)
MCHC: 33.2 g/dL (ref 30.0–36.0)
MCV: 67.6 fL — AB (ref 78.0–100.0)
Platelets: 185 10*3/uL (ref 150–400)
RBC: 5.89 MIL/uL — ABNORMAL HIGH (ref 4.22–5.81)
RDW: 14.4 % (ref 11.5–15.5)
WBC: 9.1 10*3/uL (ref 4.0–10.5)

## 2015-04-18 LAB — TSH: TSH: 25.863 u[IU]/mL — AB (ref 0.350–4.500)

## 2015-04-18 LAB — GLUCOSE, CAPILLARY
Glucose-Capillary: 184 mg/dL — ABNORMAL HIGH (ref 65–99)
Glucose-Capillary: 153 mg/dL — ABNORMAL HIGH (ref 65–99)

## 2015-04-18 LAB — PLATELET COUNT: Platelets: 176 10*3/uL (ref 150–400)

## 2015-04-18 LAB — MRSA PCR SCREENING: MRSA BY PCR: NEGATIVE

## 2015-04-18 SURGERY — LEFT HEART CATH AND CORONARY ANGIOGRAPHY
Anesthesia: LOCAL

## 2015-04-18 MED ORDER — NITROGLYCERIN 1 MG/10 ML FOR IR/CATH LAB
INTRA_ARTERIAL | Status: DC | PRN
  Administered 2015-04-18: 150 ug

## 2015-04-18 MED ORDER — HEPARIN (PORCINE) IN NACL 2-0.9 UNIT/ML-% IJ SOLN
INTRAMUSCULAR | Status: AC
  Filled 2015-04-18: qty 1500

## 2015-04-18 MED ORDER — DOCUSATE SODIUM 100 MG PO CAPS: 100.0000 mg | ORAL_CAPSULE | Freq: Two times a day (BID) | ORAL | Status: DC

## 2015-04-18 MED ORDER — NITROGLYCERIN 1 MG/10 ML FOR IR/CATH LAB
INTRA_ARTERIAL | Status: AC
  Filled 2015-04-18: qty 10

## 2015-04-18 MED ORDER — ACETAMINOPHEN 325 MG PO TABS
650.0000 mg | ORAL_TABLET | ORAL | Status: DC | PRN
  Administered 2015-04-23: 650 mg via ORAL
  Filled 2015-04-18: qty 2

## 2015-04-18 MED ORDER — LISINOPRIL 10 MG PO TABS
10.0000 mg | ORAL_TABLET | Freq: Every day | ORAL | Status: DC
  Filled 2015-04-18: qty 1

## 2015-04-18 MED ORDER — ZOLPIDEM TARTRATE 5 MG PO TABS
5.0000 mg | ORAL_TABLET | Freq: Every evening | ORAL | Status: DC | PRN
  Administered 2015-04-18: 5 mg via ORAL
  Filled 2015-04-18: qty 1

## 2015-04-18 MED ORDER — LIDOCAINE HCL (PF) 1 % IJ SOLN
INTRAMUSCULAR | Status: AC
  Filled 2015-04-18: qty 30

## 2015-04-18 MED ORDER — FENTANYL CITRATE (PF) 100 MCG/2ML IJ SOLN
INTRAMUSCULAR | Status: AC
  Filled 2015-04-18: qty 4

## 2015-04-18 MED ORDER — HEPARIN SODIUM (PORCINE) 1000 UNIT/ML IJ SOLN
INTRAMUSCULAR | Status: AC
  Filled 2015-04-18: qty 1

## 2015-04-18 MED ORDER — PRASUGREL HCL 10 MG PO TABS
ORAL_TABLET | ORAL | Status: AC
  Filled 2015-04-18: qty 6

## 2015-04-18 MED ORDER — MIDAZOLAM HCL 2 MG/2ML IJ SOLN
INTRAMUSCULAR | Status: AC
  Filled 2015-04-18: qty 4

## 2015-04-18 MED ORDER — HEPARIN SODIUM (PORCINE) 1000 UNIT/ML IJ SOLN
5000.0000 [IU] | Freq: Once | INTRAMUSCULAR | Status: AC
  Administered 2015-04-18: 5000 [IU] via INTRAVENOUS

## 2015-04-18 MED ORDER — ONDANSETRON HCL 4 MG/2ML IJ SOLN: 4.0000 mg | Freq: Four times a day (QID) | INTRAMUSCULAR | Status: DC | PRN

## 2015-04-18 MED ORDER — SODIUM CHLORIDE 0.9 % IV SOLN
INTRAVENOUS | Status: AC
  Administered 2015-04-18: 14:00:00 via INTRAVENOUS

## 2015-04-18 MED ORDER — LEVOTHYROXINE SODIUM 50 MCG PO TABS: 50.0000 ug | ORAL_TABLET | Freq: Every day | ORAL | Status: DC

## 2015-04-18 MED ORDER — DULOXETINE HCL 30 MG PO CPEP: 30.0000 mg | ORAL_CAPSULE | Freq: Every day | ORAL | Status: DC

## 2015-04-18 MED ORDER — TIROFIBAN (AGGRASTAT) BOLUS VIA INFUSION
INTRAVENOUS | Status: DC | PRN
Start: 2015-04-18 — End: 2015-04-18
  Administered 2015-04-18: 2300 ug via INTRAVENOUS

## 2015-04-18 MED ORDER — ATORVASTATIN CALCIUM 80 MG PO TABS
80.0000 mg | ORAL_TABLET | Freq: Every day | ORAL | Status: DC
  Administered 2015-04-18 – 2015-04-23 (×6): 80 mg via ORAL
  Filled 2015-04-18 (×7): qty 1

## 2015-04-18 MED ORDER — MIDAZOLAM HCL 2 MG/2ML IJ SOLN
INTRAMUSCULAR | Status: DC | PRN
  Administered 2015-04-18: 2 mg via INTRAVENOUS

## 2015-04-18 MED ORDER — CARVEDILOL 6.25 MG PO TABS
6.2500 mg | ORAL_TABLET | Freq: Two times a day (BID) | ORAL | Status: DC
  Administered 2015-04-19 – 2015-04-24 (×11): 6.25 mg via ORAL
  Filled 2015-04-18 (×8): qty 1
  Filled 2015-04-18: qty 2
  Filled 2015-04-18 (×4): qty 1

## 2015-04-18 MED ORDER — VERAPAMIL HCL 2.5 MG/ML IV SOLN
INTRAVENOUS | Status: DC | PRN
  Administered 2015-04-18 (×2): 100 ug via INTRACORONARY
  Administered 2015-04-18: 200 ug via INTRACORONARY

## 2015-04-18 MED ORDER — LIVING WELL WITH DIABETES BOOK
Freq: Once | Status: AC
  Administered 2015-04-18: 18:00:00
  Filled 2015-04-18: qty 1

## 2015-04-18 MED ORDER — IOHEXOL 350 MG/ML SOLN
INTRAVENOUS | Status: DC | PRN
  Administered 2015-04-18: 190 mL via INTRAVENOUS

## 2015-04-18 MED ORDER — HEART ATTACK BOUNCING BOOK
Freq: Once | Status: AC
  Administered 2015-04-18: 18:00:00
  Filled 2015-04-18: qty 1

## 2015-04-18 MED ORDER — TIROFIBAN HCL IV 5 MG/100ML
INTRAVENOUS | Status: AC
  Filled 2015-04-18: qty 100

## 2015-04-18 MED ORDER — PNEUMOCOCCAL VAC POLYVALENT 25 MCG/0.5ML IJ INJ
0.5000 mL | INJECTION | INTRAMUSCULAR | Status: AC
  Administered 2015-04-21: 0.5 mL via INTRAMUSCULAR
  Filled 2015-04-18 (×2): qty 0.5

## 2015-04-18 MED ORDER — SODIUM CHLORIDE 0.9 % IJ SOLN
3.0000 mL | Freq: Two times a day (BID) | INTRAMUSCULAR | Status: DC
Start: 2015-04-18 — End: 2015-04-24
  Administered 2015-04-19 – 2015-04-24 (×8): 3 mL via INTRAVENOUS

## 2015-04-18 MED ORDER — ASPIRIN 81 MG PO CHEW
CHEWABLE_TABLET | ORAL | Status: AC
  Administered 2015-04-18: 12:00:00
  Filled 2015-04-18: qty 4

## 2015-04-18 MED ORDER — CARVEDILOL 3.125 MG PO TABS
3.1250 mg | ORAL_TABLET | Freq: Two times a day (BID) | ORAL | Status: DC
  Administered 2015-04-18: 3.125 mg via ORAL
  Filled 2015-04-18: qty 1

## 2015-04-18 MED ORDER — BIVALIRUDIN 250 MG IV SOLR
INTRAVENOUS | Status: AC
  Filled 2015-04-18: qty 250

## 2015-04-18 MED ORDER — SODIUM CHLORIDE 0.9 % IV SOLN
INTRAVENOUS | Status: DC | PRN
  Administered 2015-04-18: 92 mL via INTRAVENOUS

## 2015-04-18 MED ORDER — PRASUGREL HCL 10 MG PO TABS
10.0000 mg | ORAL_TABLET | Freq: Every day | ORAL | Status: DC
Start: 2015-04-19 — End: 2015-04-24
  Administered 2015-04-19 – 2015-04-24 (×6): 10 mg via ORAL
  Filled 2015-04-18 (×8): qty 1

## 2015-04-18 MED ORDER — TIROFIBAN HCL IV 5 MG/100ML: 0.1500 ug/kg/min | INTRAVENOUS | Status: AC

## 2015-04-18 MED ORDER — LISINOPRIL 5 MG PO TABS
5.0000 mg | ORAL_TABLET | Freq: Every day | ORAL | Status: DC
Start: 2015-04-18 — End: 2015-04-18
  Administered 2015-04-18: 5 mg via ORAL
  Filled 2015-04-18: qty 1

## 2015-04-18 MED ORDER — ASPIRIN EC 81 MG PO TBEC
81.0000 mg | DELAYED_RELEASE_TABLET | Freq: Every day | ORAL | Status: DC
  Administered 2015-04-19 – 2015-04-24 (×6): 81 mg via ORAL
  Filled 2015-04-18 (×8): qty 1

## 2015-04-18 MED ORDER — METOPROLOL TARTRATE 1 MG/ML IV SOLN
INTRAVENOUS | Status: AC
  Filled 2015-04-18: qty 5

## 2015-04-18 MED ORDER — LIDOCAINE HCL (PF) 1 % IJ SOLN
INTRAMUSCULAR | Status: DC | PRN
  Administered 2015-04-18: 2 mL

## 2015-04-18 MED ORDER — SODIUM CHLORIDE 0.9 % IV SOLN: 250.0000 mL | INTRAVENOUS | Status: DC | PRN

## 2015-04-18 MED ORDER — TIROFIBAN HCL IV 5 MG/100ML
INTRAVENOUS | Status: DC | PRN
  Administered 2015-04-18: 0.15 ug/kg/min via INTRAVENOUS

## 2015-04-18 MED ORDER — DIAZEPAM 5 MG PO TABS: 5.0000 mg | ORAL_TABLET | Freq: Three times a day (TID) | ORAL | Status: DC | PRN

## 2015-04-18 MED ORDER — NITROGLYCERIN 0.4 MG SL SUBL
SUBLINGUAL_TABLET | SUBLINGUAL | Status: AC
  Administered 2015-04-18: 12:00:00
  Filled 2015-04-18: qty 1

## 2015-04-18 MED ORDER — VERAPAMIL HCL 2.5 MG/ML IV SOLN
INTRAVENOUS | Status: AC
  Filled 2015-04-18: qty 2

## 2015-04-18 MED ORDER — NITROGLYCERIN 0.4 MG SL SUBL
0.4000 mg | SUBLINGUAL_TABLET | SUBLINGUAL | Status: DC | PRN
  Administered 2015-04-18: 0.4 mg via SUBLINGUAL

## 2015-04-18 MED ORDER — INSULIN ASPART 100 UNIT/ML ~~LOC~~ SOLN
0.0000 [IU] | Freq: Three times a day (TID) | SUBCUTANEOUS | Status: DC
  Administered 2015-04-18 – 2015-04-19 (×2): 3 [IU] via SUBCUTANEOUS
  Administered 2015-04-19 – 2015-04-20 (×3): 5 [IU] via SUBCUTANEOUS
  Administered 2015-04-20: 3 [IU] via SUBCUTANEOUS
  Administered 2015-04-21: 8 [IU] via SUBCUTANEOUS
  Administered 2015-04-21 – 2015-04-22 (×5): 3 [IU] via SUBCUTANEOUS
  Administered 2015-04-23: 2 [IU] via SUBCUTANEOUS
  Administered 2015-04-23 – 2015-04-24 (×3): 3 [IU] via SUBCUTANEOUS

## 2015-04-18 MED ORDER — PRASUGREL HCL 10 MG PO TABS
ORAL_TABLET | ORAL | Status: DC | PRN
  Administered 2015-04-18: 60 mg via ORAL

## 2015-04-18 MED ORDER — BIVALIRUDIN BOLUS VIA INFUSION - CUPID
INTRAVENOUS | Status: DC | PRN
  Administered 2015-04-18: 69 mg via INTRAVENOUS

## 2015-04-18 MED ORDER — SODIUM CHLORIDE 0.9 % IJ SOLN: 3.0000 mL | INTRAMUSCULAR | Status: DC | PRN

## 2015-04-18 MED ORDER — MORPHINE SULFATE (PF) 2 MG/ML IV SOLN
INTRAVENOUS | Status: AC
  Filled 2015-04-18: qty 1

## 2015-04-18 MED ORDER — SODIUM CHLORIDE 0.9 % IV SOLN
250.0000 mg | INTRAVENOUS | Status: DC | PRN
  Administered 2015-04-18: 1.75 mg/kg/h via INTRAVENOUS

## 2015-04-18 MED ORDER — FENTANYL CITRATE (PF) 100 MCG/2ML IJ SOLN
INTRAMUSCULAR | Status: DC | PRN
  Administered 2015-04-18: 25 ug via INTRAVENOUS

## 2015-04-18 MED ORDER — HYDROCODONE-ACETAMINOPHEN 5-325 MG PO TABS: 1.0000 | ORAL_TABLET | Freq: Every evening | ORAL | Status: DC | PRN

## 2015-04-18 SURGICAL SUPPLY — 18 items
BALLN TREK RX 2.5X15 (BALLOONS) ×2
BALLN ~~LOC~~ TREK RX 3.25X15 (BALLOONS) ×2 IMPLANT
BALLOON TREK RX 2.5X15 (BALLOONS) ×1 IMPLANT
CATH INFINITI 5 FR JL3.5 (CATHETERS) ×2 IMPLANT
CATH INFINITI 5FR ANG PIGTAIL (CATHETERS) ×2 IMPLANT
CATH INFINITI JR4 5F (CATHETERS) ×2 IMPLANT
CATH VISTA GUIDE 6FR XBLAD3.5 (CATHETERS) ×2 IMPLANT
DEVICE RAD COMP TR BAND LRG (VASCULAR PRODUCTS) ×2 IMPLANT
GLIDESHEATH SLEND SS 6F .021 (SHEATH) ×2 IMPLANT
KIT ENCORE 26 ADVANTAGE (KITS) ×2 IMPLANT
KIT HEART LEFT (KITS) ×2 IMPLANT
PACK CARDIAC CATHETERIZATION (CUSTOM PROCEDURE TRAY) ×2 IMPLANT
STENT XIENCE ALPINE RX 3.0X38 (Permanent Stent) ×2 IMPLANT
SYR MEDRAD MARK V 150ML (SYRINGE) ×2 IMPLANT
TRANSDUCER W/STOPCOCK (MISCELLANEOUS) ×2 IMPLANT
TUBING CIL FLEX 10 FLL-RA (TUBING) ×2 IMPLANT
WIRE COUGAR XT STRL 190CM (WIRE) ×2 IMPLANT
WIRE SAFE-T 1.5MM-J .035X260CM (WIRE) ×2 IMPLANT

## 2015-04-18 NOTE — Progress Notes (Signed)
CRITICAL VALUE ALERT  Critical value received:  Troponin 39.96  Date of notification:  04/18/15  Time of notification:  9678  Critical value read back:Yes.    Nurse who received alert:  Raj Janus, RN  MD notified (1st page):  Dr. Burt Knack  Time of first page:  1600  Responding MD:  Dr.  Burt Knack  Time MD responded:  1600  Expected finding with STEMI, no changes.

## 2015-04-18 NOTE — ED Notes (Signed)
Pt complaining of central chest pressure starting 2 hours ago. States "I ate some peanuts last night, I wasn't supposed to. I think it's coming from that." Pt denies SOB, N/V/D, fever/chills. Appears pale, throwing multifocal trigeminy PVCs on monitor in triage.

## 2015-04-18 NOTE — ED Provider Notes (Signed)
CSN: [id number]     Arrival date & time 04/18/15  1125 History   First MD Initiated Contact with Patient 04/18/15 1135     Chief Complaint  Patient presents with  . Chest Pain     (Consider location/radiation/quality/duration/timing/severity/associated sxs/prior Treatment) Patient is a 64 y.o. male presenting with chest pain.  Chest Pain Pain location:  Substernal area Pain quality: pressure   Pain radiates to:  Does not radiate Pain severity:  Moderate Onset quality:  Gradual Duration:  2 hours Timing:  Constant Progression:  Unchanged Chronicity:  New Context comment:  Pt reports it is from eating peanuts last night.  Relieved by:  Nothing Worsened by:  Nothing tried Associated symptoms: no diaphoresis, no dizziness, no nausea, no shortness of breath and not vomiting     Past Medical History  Diagnosis Date  . Unspecified essential hypertension 02/08/2013  . Thyroid disease   . Depression   . Diabetes mellitus without complication   . Acute MI, lateral wall, initial episode of care 04/18/2015   Past Surgical History  Procedure Laterality Date  . No prior surgery    . Cardiac catheterization N/A 04/18/2015    Procedure: Left Heart Cath and Coronary Angiography;  Surgeon: Sherren Mocha, MD;  Location: [place] [facility];  Service: Cardiovascular;  Laterality: N/A;  . Cardiac catheterization N/A 04/18/2015    Procedure: Coronary Stent Intervention;  Surgeon: Sherren Mocha, MD;  Location: [place] [facility];  Service: Cardiovascular;  Laterality: N/A;   Family History  Problem Relation Age of Onset  . Heart disease Mother   . Cancer Father   . Colon cancer Neg Hx    Social History  Substance Use Topics  . Smoking status: Light Tobacco Smoker -- 0.25 packs/day    Types: Cigarettes  . Smokeless tobacco: Never Used  . Alcohol Use: No    Review of Systems  Constitutional: Negative for diaphoresis.  Respiratory: Negative for shortness of breath.   Cardiovascular:  Positive for chest pain.  Gastrointestinal: Negative for nausea and vomiting.  Neurological: Negative for dizziness.  All other systems reviewed and are negative.     Allergies  Peanut-containing drug products  Home Medications   Prior to Admission medications   Medication Sig Start Date End Date Taking? Authorizing Provider  aspirin 81 MG tablet Take 81 mg by mouth 3 (three) times a week.    Yes Historical Provider, MD  Cholecalciferol (VITAMIN D-3) 5000 UNITS TABS Take 5,000 Units by mouth daily.    Yes Historical Provider, MD   BP 162/95 mmHg  Pulse 83  Temp(Src) 97.9 F (36.6 C) (Oral)  Resp 25  Ht 5\' 8"  (1.727 m)  Wt 200 lb 2.8 oz (90.8 kg)  BMI 30.44 kg/m2  SpO2 99% Physical Exam  Constitutional: He is oriented to person, place, and time. He appears well-developed and well-nourished. No distress.  HENT:  Head: Normocephalic and atraumatic.  Mouth/Throat: Oropharynx is clear and moist.  Eyes: Conjunctivae are normal. Pupils are equal, round, and reactive to light. No scleral icterus.  Neck: Neck supple.  Cardiovascular: Normal rate, regular rhythm, normal heart sounds and intact distal pulses.   No murmur heard. Pulmonary/Chest: Effort normal and breath sounds normal. No stridor. No respiratory distress. He has no wheezes. He has no rales.  Abdominal: Soft. He exhibits no distension. There is no tenderness.  Musculoskeletal: Normal range of motion. He exhibits no edema.  Neurological: He is alert and oriented to person, place, and time.  Skin:  Skin is warm and dry. No rash noted. He is not diaphoretic.  Psychiatric: He has a normal mood and affect. His behavior is normal.  Nursing note and vitals reviewed.   ED Course  Procedures (including critical care time) Labs Review Labs Reviewed  BASIC METABOLIC PANEL - Abnormal; Notable for the following:    Glucose, Bld 205 (*)    BUN 21 (*)    Creatinine, Ser 1.27 (*)    GFR calc non Af Amer 58 (*)    All other  components within normal limits  CBC - Abnormal; Notable for the following:    RBC 5.89 (*)    MCV 67.6 (*)    MCH 22.4 (*)    All other components within normal limits  TROPONIN I - Abnormal; Notable for the following:    Troponin I 39.96 (*)    All other components within normal limits  TSH - Abnormal; Notable for the following:    TSH 25.863 (*)    All other components within normal limits  GLUCOSE, CAPILLARY - Abnormal; Notable for the following:    Glucose-Capillary 184 (*)    All other components within normal limits  I-STAT TROPOININ, ED - Abnormal; Notable for the following:    Troponin i, poc 0.09 (*)    All other components within normal limits  MRSA PCR SCREENING  BRAIN NATRIURETIC PEPTIDE  HEMOGLOBIN A1C  TROPONIN I  TROPONIN I  PLATELET COUNT  LIPID PANEL  BASIC METABOLIC PANEL  CBC    Imaging Review No results found. I have personally reviewed and evaluated these images and lab results as part of my medical decision-making.   EKG Interpretation   Date/Time:  Wednesday April 18 2015 11:33:36 EDT Ventricular Rate:  93 PR Interval:  177 QRS Duration: 81 QT Interval:  355 QTC Calculation: 441 R Axis:   -7 Text Interpretation:  Sinus rhythm Ventricular trigeminy Lateral infarct,  acute (LAD) Anterior infarct, old No old tracing to compare Confirmed by  [facility]  MD, TREY ([id number]) on 04/18/2015 11:41:57 AM      MDM   Final diagnoses:  ST elevation myocardial infarction (STEMI), unspecified artery    64 yo male with acute STEMI.  Given aspirin and heparin.  Discussed with Dr. Burt Knack.  Transferred quickly to [facility] for intervention.      Serita Grit, MD 04/18/15 1754

## 2015-04-18 NOTE — Progress Notes (Signed)
Post-PCI Note: reviewed cath findings at length with patient. Discussed treatment options of PCI versus CABG, and relative pros and cons of each approach. He understands the need to make major changes to his lifestyle, including tobacco cessation and medication adherence. 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 I have reviewed his films with Dr Angelena Form and tentatively plan to schedule the procedure with him.  Sherren Mocha 04/18/2015 6:03 PM

## 2015-04-18 NOTE — Progress Notes (Signed)
Utilization Review Completed.Donne Anon T8/17/2016

## 2015-04-18 NOTE — ED Notes (Addendum)
Code stemi called by Dr Doy Mince...klj [facility] notified.Marland Kitchen

## 2015-04-18 NOTE — ED Notes (Signed)
Report called to [facility] °

## 2015-04-18 NOTE — ED Notes (Signed)
This Charge RN reassured the Pt and asked if we could call any family.  Pt reported "I don't want to concern my sisters with this.  They get off work at [employer]."  Multimedia programmer, again, explained the severity of the situation and the Pt repeated that he did not want to bother his sisters.

## 2015-04-18 NOTE — H&P (Signed)
History and Physical  Patient ID: Nicolas Ray MRN: [id number], SOB: [birth year] 64 y.o. Date of Encounter: 04/18/2015, 12:13 PM  Primary Physician: Angelica Chessman, MD Primary Cardiologist: none   Chief Complaint: Chest pain  HPI: 64 y.o. male w/ PMHx significant for diabetes and HTN who presented to [facility] on 04/18/2015 with complaints of Chest pain.  The patient is transferred from [facility] long emergency room as a code STEMI. The patient presented there because of dull substernal chest pain, non-radiating, described as 'mild.' He states 'I don't think anything is wrong with me.' Despite his records clearly demonstrating diabetes and HTN, the patient denies either problem. He denies associated dyspnea, edema, orthopnea, or PND. He has no personal history of heart disease.    Past Medical History  Diagnosis Date  . Unspecified essential hypertension 02/08/2013  . Thyroid disease   . Depression   . Diabetes mellitus without complication   . Acute MI, lateral wall, initial episode of care 04/18/2015     Surgical History:  Past Surgical History  Procedure Laterality Date  . No prior surgery       Home Meds: Prior to Admission medications   Medication Sig Start Date End Date Taking? Authorizing Provider  Alcohol Swabs 70 % PADS 1 application by Does not apply route daily. 06/15/13   Nishant Dhungel, MD  amLODipine (NORVASC) 10 MG tablet Take 1 tablet (10 mg total) by mouth daily. 07/15/13   Thurnell Lose, MD  aspirin 81 MG tablet Take 81 mg by mouth daily.    Historical Provider, MD  Cholecalciferol (VITAMIN D-3) 5000 UNITS TABS Take by mouth daily.    Historical Provider, MD  diazepam (VALIUM) 5 MG tablet Take 1 tablet (5 mg total) by mouth every 8 (eight) hours as needed (Take 1-2 pills every 8 hours as needed for pain). 03/11/13   Noland Fordyce, PA-C  docusate sodium (COLACE) 100 MG capsule Take 1 capsule (100 mg total) by mouth 2 (two) times daily. 07/15/13    Thurnell Lose, MD  DULoxetine (CYMBALTA) 30 MG capsule Take 1 capsule (30 mg total) by mouth daily. 01/16/14   Tresa Garter, MD  glucose blood (BL TEST STRIP PACK) test strip Use as instructed 06/15/13   Nishant Dhungel, MD  glucose monitoring kit (FREESTYLE) monitoring kit 1 each by Does not apply route as needed for other. 06/15/13   Nishant Dhungel, MD  HYDROcodone-acetaminophen (NORCO/VICODIN) 5-325 MG per tablet Take 1 tablet by mouth at bedtime as needed for pain. 03/15/13   Clanford Marisa Hua, MD  ibuprofen (ADVIL,MOTRIN) 600 MG tablet Take 1 tablet (600 mg total) by mouth every 6 (six) hours as needed for pain. 03/11/13   Noland Fordyce, PA-C  Lancets (ACCU-CHEK MULTICLIX) lancets Use as instructed 06/15/13   Nishant Dhungel, MD  levothyroxine (LEVOTHROID) 50 MCG tablet Take 1 tablet (50 mcg total) by mouth daily before breakfast. 06/15/13   Nishant Dhungel, MD  lisinopril (PRINIVIL,ZESTRIL) 5 MG tablet Take 1 tablet (5 mg total) by mouth daily. 02/08/13   Clanford Marisa Hua, MD  metformin (GLUCOPHAGE XR) 1000 MG (OSM) 24 hr tablet Take 1 po daily with breakfast for 1 week, then increase to 1 tablet with breakfast and 1 tablet with supper 07/15/13   Thurnell Lose, MD  tadalafil (CIALIS) 20 MG tablet Take 0.5-1 tablets (10-20 mg total) by mouth every other day as needed for erectile dysfunction. 04/18/14   Tresa Garter, MD  zolpidem (AMBIEN) 5  MG tablet Take 1 tablet (5 mg total) by mouth at bedtime as needed for sleep. 04/19/13   Tresa Garter, MD    Allergies: No Known Allergies  Social History   Social History  . Marital Status: Divorced    Spouse Name: N/A  . Number of Children: N/A  . Years of Education: N/A   Occupational History  . Not on file.   Social History Main Topics  . Smoking status: Light Tobacco Smoker -- 0.25 packs/day    Types: Cigarettes  . Smokeless tobacco: Never Used  . Alcohol Use: No  . Drug Use: No  . Sexual Activity: Not on file    Other Topics Concern  . Not on file   Social History Narrative     Family History  Problem Relation Age of Onset  . Heart disease Mother   . Cancer Father   . Colon cancer Neg Hx     Review of Systems: General: negative for chills, fever, night sweats or weight changes.  ENT: negative for rhinorrhea or epistaxis Cardiovascular: see HPI Dermatological: negative for rash Respiratory: negative for cough or wheezing GI: negative for nausea, vomiting, diarrhea, bright red blood per rectum, melena, or hematemesis GU: no hematuria, urgency, or frequency Neurologic: negative for visual changes, syncope, headache, or dizziness Heme: no easy bruising or bleeding Endo: negative for excessive thirst, thyroid disorder, or flushing Musculoskeletal: negative for joint pain or swelling, negative for myalgias All other systems reviewed and are otherwise negative except as noted above.  Physical Exam: Blood pressure 152/111, pulse 101, temperature 97.7 F (36.5 C), temperature source Oral, resp. rate 23, SpO2 0 %. General: Well developed, well nourished, obese male, alert and oriented, in no acute distress. HEENT: Normocephalic, atraumatic, sclera anicteric Neck: Supple. Carotids 2+ without bruits. JVP normal Lungs: Clear bilaterally to auscultation without wheezes, rales, or rhonchi. Breathing is unlabored. Heart: RRR with normal S1 and S2. No murmurs, rubs, or gallops appreciated. Abdomen: Soft, non-tender, non-distended with normoactive bowel sounds. No hepatomegaly, obese Back: No CVA tenderness Msk:  Strength and tone appear normal for age. Extremities: No clubbing, cyanosis, or edema.  Distal pedal pulses are 2+ and equal bilaterally. Neuro: CNII-XII intact, moves all extremities spontaneously. Psych:  Responds to questions appropriately with a normal affect. Skin: warm and dry without rash   Labs:   Lab Results  Component Value Date   WBC 9.1 04/18/2015   HGB 13.2 04/18/2015    HCT 39.8 04/18/2015   MCV 67.6* 04/18/2015   PLT 185 04/18/2015   No results for input(s): NA, K, CL, CO2, BUN, CREATININE, CALCIUM, PROT, BILITOT, ALKPHOS, ALT, AST, GLUCOSE in the last 168 hours.  Invalid input(s): LABALBU No results for input(s): CKTOTAL, CKMB, TROPONINI in the last 72 hours. Lab Results  Component Value Date   CHOL 175 01/25/2013   HDL 35* 01/25/2013   LDLCALC 94 01/25/2013   TRIG 232* 01/25/2013   No results found for: DDIMER  Radiology/Studies:  No results found.   EKG: NSR< frequent PVC's, acute lateral injury  CARDIAC STUDIES: Cath pending  ASSESSMENT AND PLAN:  64 yo gentleman with acute lateral STEMI, background hx of diabetes and HTN (in denial of both). Plan emergency cath and primary PCI - emergency implied consent obtained. He understands rationale for cath/PCI. Further plans pending cath results.  Diabetes, Type 2: cover with SSI, adjust meds as needed, will need extensive education.  HTN: same as above. Disease specific meds with beta-blocker/ACE.  Lipids: high-intensity statin.  Dispo: pending cath result.  Deatra James MD 04/18/2015, 12:13 PM

## 2015-04-18 NOTE — Progress Notes (Signed)
TR band removed, site is level 1, with small palpable hematoma above insertion site. Stable with no further bleeding. Dressing applied and pt educated on wrist restrictions.

## 2015-04-18 NOTE — ED Notes (Addendum)
Dr Doy Mince aware of elevated Troponin.

## 2015-04-19 ENCOUNTER — Inpatient Hospital Stay (HOSPITAL_COMMUNITY): Payer: 59

## 2015-04-19 DIAGNOSIS — E1121 Type 2 diabetes mellitus with diabetic nephropathy: Secondary | ICD-10-CM

## 2015-04-19 DIAGNOSIS — R079 Chest pain, unspecified: Secondary | ICD-10-CM

## 2015-04-19 DIAGNOSIS — E1140 Type 2 diabetes mellitus with diabetic neuropathy, unspecified: Secondary | ICD-10-CM

## 2015-04-19 DIAGNOSIS — E118 Type 2 diabetes mellitus with unspecified complications: Secondary | ICD-10-CM

## 2015-04-19 DIAGNOSIS — I10 Essential (primary) hypertension: Secondary | ICD-10-CM

## 2015-04-19 DIAGNOSIS — E038 Other specified hypothyroidism: Secondary | ICD-10-CM

## 2015-04-19 DIAGNOSIS — I5022 Chronic systolic (congestive) heart failure: Secondary | ICD-10-CM

## 2015-04-19 DIAGNOSIS — I5021 Acute systolic (congestive) heart failure: Secondary | ICD-10-CM

## 2015-04-19 LAB — BASIC METABOLIC PANEL
ANION GAP: 8 (ref 5–15)
BUN: 14 mg/dL (ref 6–20)
CHLORIDE: 104 mmol/L (ref 101–111)
CO2: 24 mmol/L (ref 22–32)
Calcium: 9.1 mg/dL (ref 8.9–10.3)
Creatinine, Ser: 1.22 mg/dL (ref 0.61–1.24)
GFR calc Af Amer: >60 mL/min (ref >60)
GLUCOSE: 162 mg/dL — AB (ref 65–99)
POTASSIUM: 3.9 mmol/L (ref 3.5–5.1)
Sodium: 136 mmol/L (ref 135–145)

## 2015-04-19 LAB — LIPID PANEL
Cholesterol: 173 mg/dL (ref 0–200)
HDL: 28 mg/dL — AB (ref >40)
LDL Cholesterol: 96 mg/dL (ref 0–99)
TRIGLYCERIDES: 243 mg/dL — AB (ref <150)
Total CHOL/HDL Ratio: 6.2 RATIO
VLDL: 49 mg/dL — ABNORMAL HIGH (ref 0–40)

## 2015-04-19 LAB — CBC
HEMATOCRIT: 37.7 % — AB (ref 39.0–52.0)
HEMOGLOBIN: 12.4 g/dL — AB (ref 13.0–17.0)
MCH: 21.8 pg — ABNORMAL LOW (ref 26.0–34.0)
MCHC: 32.9 g/dL (ref 30.0–36.0)
MCV: 66.4 fL — AB (ref 78.0–100.0)
Platelets: 187 10*3/uL (ref 150–400)
RBC: 5.68 MIL/uL (ref 4.22–5.81)
RDW: 14.4 % (ref 11.5–15.5)
WBC: 11.7 10*3/uL — AB (ref 4.0–10.5)

## 2015-04-19 LAB — GLUCOSE, CAPILLARY
GLUCOSE-CAPILLARY: 222 mg/dL — AB (ref 65–99)
GLUCOSE-CAPILLARY: 186 mg/dL — AB (ref 65–99)
GLUCOSE-CAPILLARY: 206 mg/dL — AB (ref 65–99)
Glucose-Capillary: 191 mg/dL — ABNORMAL HIGH (ref 65–99)

## 2015-04-19 LAB — HEMOGLOBIN A1C
Hgb A1c MFr Bld: 8.5 % — ABNORMAL HIGH (ref 4.8–5.6)
Mean Plasma Glucose: 197 mg/dL

## 2015-04-19 LAB — BRAIN NATRIURETIC PEPTIDE: B NATRIURETIC PEPTIDE 5: 171.2 pg/mL — AB (ref 0.0–100.0)

## 2015-04-19 LAB — TROPONIN I

## 2015-04-19 MED ORDER — LIVING WELL WITH DIABETES BOOK
Freq: Once | Status: DC
Start: 2015-04-19 — End: 2015-04-24
  Filled 2015-04-19: qty 1

## 2015-04-19 MED ORDER — TRAZODONE HCL 100 MG PO TABS
100.0000 mg | ORAL_TABLET | Freq: Every evening | ORAL | Status: DC | PRN
  Administered 2015-04-19 – 2015-04-23 (×3): 100 mg via ORAL
  Filled 2015-04-19 (×2): qty 1
  Filled 2015-04-19: qty 2

## 2015-04-19 MED ORDER — LEVOTHYROXINE SODIUM 25 MCG PO TABS
25.0000 ug | ORAL_TABLET | Freq: Every day | ORAL | Status: DC
  Administered 2015-04-19 – 2015-04-24 (×6): 25 ug via ORAL
  Filled 2015-04-19 (×8): qty 1

## 2015-04-19 MED ORDER — LISINOPRIL 20 MG PO TABS
20.0000 mg | ORAL_TABLET | Freq: Every day | ORAL | Status: DC
  Administered 2015-04-19 – 2015-04-21 (×3): 20 mg via ORAL
  Filled 2015-04-19 (×4): qty 1

## 2015-04-19 MED FILL — Heparin Sodium (Porcine) 2 Unit/ML in Sodium Chloride 0.9%: INTRAMUSCULAR | Qty: 1500 | Status: AC

## 2015-04-19 NOTE — Progress Notes (Addendum)
CARDIAC REHAB PHASE I   PRE:  Rate/Rhythm: 80 SR  BP:  Sitting: 135/76        SaO2: 99 RA  MODE:  Ambulation: 350 ft   POST:  Rate/Rhythm: 85 SR  BP:  Sitting: 142/87         SaO2: 100 RA  Pt ambulated 350 ft on RA, brisk, steady gait, independent, tolerated well. Pt denies CP, dizziness, DOE, declined rest stop. Pt did appear a little short of breath after walk, denies. Began MI/PCI education. Reviewed MI book, risk factors, tobacco cessation (pt after being educated on the risk of heart disease and smoking, states "smoking has to do with the lungs, not the heart"), anti-platelet therapy, stent card, activity restrictions, gave pt heart healthy diet, diabetes and low sodium diet handouts to review. Pt verbalized understanding, however very slow to process education and seems to be in denial regarding the fact that he had a heart attack. When discussing anti-platelet therapy, pt states "I'm not sure I want this kind of lifestyle, what is the point?" Emphasized importance of medication compliance. Pt states he was told he had diabetes two years ago and stopped taking the medication because it made him "feel bad." Discussed hemoglobin A1C and possibility of need for medication for diabetes in the futures. Pt does seem to be at risk for non-compliance. Pt seems overwhelmed by education, difficult to assess comprehension. Still needs to review diet, exercise guidelines, nitroglycerin, phase 2 and CHF education depending on echo results. Will continue to follow.   [id number]  Lenna Sciara, RN, BSN 04/19/2015 1:00 PM

## 2015-04-19 NOTE — Progress Notes (Addendum)
       Patient Name: Nicolas Ray Date of Encounter: 04/19/2015    SUBJECTIVE: Patient denies any cardiovascular symptoms. Patient is in denial concerning any pre-existing medical problems including diabetes.  TELEMETRY:  Normal sinus rhythm Filed Vitals:   04/18/15 2100 04/18/15 2200 04/18/15 2300 04/19/15 0421  BP: 154/94 162/88 163/95 162/94  Pulse: 71 80 79 88  Temp:    98.4 F (36.9 C)  TempSrc:    Oral  Resp: 22 24 29 27   Height:      Weight:      SpO2: 100% 99% 99% 98%    Intake/Output Summary (Last 24 hours) at 04/19/15 0718 Last data filed at 04/19/15 0300  Gross per 24 hour  Intake 576.42 ml  Output   1150 ml  Net -573.58 ml   LABS: Basic Metabolic Panel:  Recent Labs  04/18/15 1140 04/19/15 0245  NA 135 136  K 4.3 3.9  CL 103 104  CO2 22 24  GLUCOSE 205* 162*  BUN 21* 14  CREATININE 1.27* 1.22  CALCIUM 9.4 9.1   CBC:  Recent Labs  04/18/15 1140 04/18/15 1916 04/19/15 0245  WBC 9.1  --  11.7*  HGB 13.2  --  12.4*  HCT 39.8  --  37.7*  MCV 67.6*  --  66.4*  PLT 185 176 187   Cardiac Enzymes:  Recent Labs  04/18/15 1422 04/18/15 1916 04/19/15 0245  TROPONINI 39.96* >65.00* >65.00*    Fasting Lipid Panel:  Recent Labs  04/19/15 0245  CHOL 173  HDL 28*  LDLCALC 96  TRIG 243*  CHOLHDL 6.2   Radiology/Studies:  Not performed  Physical Exam: Blood pressure 162/94, pulse 88, temperature 98.4 F (36.9 C), temperature source Oral, resp. rate 27, height 5\' 8"  (1.727 m), weight 90.8 kg (200 lb 2.8 oz), SpO2 98 %. Weight change:   Wt Readings from Last 3 Encounters:  04/18/15 90.8 kg (200 lb 2.8 oz)  04/18/14 92.534 kg (204 lb)  01/16/14 92.08 kg (203 lb)    S4 gallop. No murmurs heard. Neck veins are flat Lungs are clear. Patient is lying flat in bed. Extremities reveal no edema. Right radial cath site is unremarkable.  ASSESSMENT:  1. Acute lateral wall infarction due to occlusion of the large diagonal. This was treated  with mechanical reperfusion and drug-eluting stent implantation. This is superimposed on multivessel coronary artery disease. 2. 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 Severe LAD circumflex and right coronary disease noted 3. Hypothyroidism, TSH 25.8 4. Hyperlipidemia, LDL 96 5. Diabetes mellitus type 2, complicated, hemoglobin L8X 7.4 6. Acute systolic heart failure, LVEF acutely 35-45% with elevated EDP 7. Tobacco abuse 8. Essential hypertension, poor control  Plan:  1. Review digital images. Determine approach for long-term therapy, i.e. multiple stents versus CABG. Diabetes, poor LV function, noncompliance/denial of cardiac risk factors, suggests a surgical revascularization procedure may be the best approach. However, the patient may not consent. 2. Aggressive risk factor modification 3. Phase I cardiac rehabilitation 4. Beta blocker therapy, ACE inhibitor therapy, high intensity statin therapy, dual antiplatelets therapy. 5. Start low dose thyroid replacement\ 6. Diabetes coordinator consult.  Demetrios Isaacs 04/19/2015, 7:18 AM

## 2015-04-19 NOTE — Care Management Note (Signed)
Case Management Note  Patient Details  Name: Nicolas Ray MRN: [id number] Date of Birth: 07-18-1951  Subjective/Objective:         Adm w mi          Action/Plan: lives w friend, pcp dr jegede  Expected Discharge Date:                  Expected Discharge Plan:  Home/Self Care  In-House Referral:     Discharge planning Services  CM Consult, Medication Assistance  Post Acute Care Choice:    Choice offered to:     DME Arranged:    DME Agency:     HH Arranged:    [place] Agency:     Status of Service:     Medicare Important Message Given:    Date Medicare IM Given:    Medicare IM give by:    Date Additional Medicare IM Given:    Additional Medicare Important Message give by:     If discussed at [place] of Stay Meetings, dates discussed:    Additional Comments: gave pt 30day free and copay assist card for effient. Pt states has ins.  Lacretia Leigh, RN 04/19/2015, 10:37 AM

## 2015-04-19 NOTE — Progress Notes (Signed)
  Echocardiogram 2D Echocardiogram has been performed.  Nicolas Ray M 04/19/2015, 10:38 AM

## 2015-04-20 ENCOUNTER — Encounter (HOSPITAL_COMMUNITY)
Admission: EM | Disposition: A | Discharge status: Home / Self Care | Payer: Self-pay | Source: Home / Self Care | Attending: Cardiovascular Disease

## 2015-04-20 DIAGNOSIS — I25810 Atherosclerosis of coronary artery bypass graft(s) without angina pectoris: Secondary | ICD-10-CM | POA: Insufficient documentation

## 2015-04-20 DIAGNOSIS — I25110 Atherosclerotic heart disease of native coronary artery with unstable angina pectoris: Secondary | ICD-10-CM

## 2015-04-20 HISTORY — PX: CARDIAC CATHETERIZATION: SHX172

## 2015-04-20 LAB — GLUCOSE, CAPILLARY
GLUCOSE-CAPILLARY: 232 mg/dL — AB (ref 65–99)
GLUCOSE-CAPILLARY: 121 mg/dL — AB (ref 65–99)
Glucose-Capillary: 170 mg/dL — ABNORMAL HIGH (ref 65–99)

## 2015-04-20 LAB — POCT ACTIVATED CLOTTING TIME: ACTIVATED CLOTTING TIME: 632 s

## 2015-04-20 SURGERY — CORONARY STENT INTERVENTION
Anesthesia: LOCAL

## 2015-04-20 MED ORDER — BIVALIRUDIN BOLUS VIA INFUSION - CUPID
INTRAVENOUS | Status: DC | PRN
  Administered 2015-04-20: 68.85 mg via INTRAVENOUS

## 2015-04-20 MED ORDER — BIVALIRUDIN 250 MG IV SOLR
INTRAVENOUS | Status: AC
  Filled 2015-04-20: qty 250

## 2015-04-20 MED ORDER — LIDOCAINE HCL (PF) 1 % IJ SOLN
INTRAMUSCULAR | Status: AC
  Filled 2015-04-20: qty 30

## 2015-04-20 MED ORDER — IOHEXOL 350 MG/ML SOLN
INTRAVENOUS | Status: DC | PRN
  Administered 2015-04-20: 115 mL via INTRA_ARTERIAL

## 2015-04-20 MED ORDER — NITROGLYCERIN IN D5W 200-5 MCG/ML-% IV SOLN
INTRAVENOUS | Status: AC
  Filled 2015-04-20: qty 250

## 2015-04-20 MED ORDER — FENTANYL CITRATE (PF) 100 MCG/2ML IJ SOLN
INTRAMUSCULAR | Status: DC | PRN
  Administered 2015-04-20: 25 ug via INTRAVENOUS
  Administered 2015-04-20: 50 ug via INTRAVENOUS

## 2015-04-20 MED ORDER — SODIUM CHLORIDE 0.9 % IV SOLN
INTRAVENOUS | Status: AC
  Administered 2015-04-20: 17:00:00 via INTRAVENOUS

## 2015-04-20 MED ORDER — MIDAZOLAM HCL 2 MG/2ML IJ SOLN
INTRAMUSCULAR | Status: DC | PRN
  Administered 2015-04-20: 2 mg via INTRAVENOUS

## 2015-04-20 MED ORDER — SODIUM CHLORIDE 0.9 % IV SOLN
250.0000 mg | INTRAVENOUS | Status: DC | PRN
  Administered 2015-04-20: 1.75 mg/kg/h via INTRAVENOUS

## 2015-04-20 MED ORDER — FENTANYL CITRATE (PF) 100 MCG/2ML IJ SOLN
INTRAMUSCULAR | Status: AC
  Filled 2015-04-20: qty 4

## 2015-04-20 MED ORDER — MIDAZOLAM HCL 2 MG/2ML IJ SOLN
INTRAMUSCULAR | Status: AC
  Filled 2015-04-20: qty 4

## 2015-04-20 MED ORDER — SODIUM CHLORIDE 0.9 % IJ SOLN: 3.0000 mL | INTRAMUSCULAR | Status: DC | PRN

## 2015-04-20 MED ORDER — SODIUM CHLORIDE 0.9 % IV SOLN: 250.0000 mL | INTRAVENOUS | Status: DC | PRN

## 2015-04-20 MED ORDER — SODIUM CHLORIDE 0.9 % IJ SOLN
3.0000 mL | Freq: Two times a day (BID) | INTRAMUSCULAR | Status: DC
  Administered 2015-04-23 – 2015-04-24 (×2): 3 mL via INTRAVENOUS

## 2015-04-20 MED ORDER — NITROGLYCERIN 1 MG/10 ML FOR IR/CATH LAB
INTRA_ARTERIAL | Status: AC
  Filled 2015-04-20: qty 10

## 2015-04-20 MED ORDER — NITROGLYCERIN IN D5W 200-5 MCG/ML-% IV SOLN
INTRAVENOUS | Status: DC | PRN
  Administered 2015-04-20: 10 ug/min via INTRAVENOUS

## 2015-04-20 MED ORDER — HEPARIN (PORCINE) IN NACL 2-0.9 UNIT/ML-% IJ SOLN
INTRAMUSCULAR | Status: AC
  Filled 2015-04-20: qty 1500

## 2015-04-20 SURGICAL SUPPLY — 19 items
BALLN EMERGE MR 2.0X20 (BALLOONS) ×2
BALLN EMERGE MR 2.5X12 (BALLOONS) ×2
BALLN ~~LOC~~ EMERGE MR 4.0X20 (BALLOONS) ×2
BALLN ~~LOC~~ EUPHORA RX 3.0X20 (BALLOONS) ×2
BALLOON EMERGE MR 2.0X20 (BALLOONS) ×1 IMPLANT
BALLOON EMERGE MR 2.5X12 (BALLOONS) ×1 IMPLANT
BALLOON ~~LOC~~ EMERGE MR 4.0X20 (BALLOONS) ×1 IMPLANT
BALLOON ~~LOC~~ EUPHORA RX 3.0X20 (BALLOONS) ×1 IMPLANT
CATH VISTA GUIDE 6FR JR4 (CATHETERS) ×2 IMPLANT
CATH VISTA GUIDE 6FR XBLAD3.5 (CATHETERS) ×2 IMPLANT
GLIDESHEATH SLEND SS 6F .021 (SHEATH) ×2 IMPLANT
KIT ENCORE 26 ADVANTAGE (KITS) ×2 IMPLANT
KIT HEART LEFT (KITS) ×2 IMPLANT
PACK CARDIAC CATHETERIZATION (CUSTOM PROCEDURE TRAY) ×2 IMPLANT
STENT PROMUS PREM MR 2.75X32 (Permanent Stent) ×2 IMPLANT
STENT PROMUS PREM MR 4.0X28 (Permanent Stent) ×2 IMPLANT
TRANSDUCER W/STOPCOCK (MISCELLANEOUS) ×2 IMPLANT
TUBING CIL FLEX 10 FLL-RA (TUBING) ×2 IMPLANT
WIRE COUGAR XT STRL 190CM (WIRE) ×2 IMPLANT

## 2015-04-20 NOTE — Progress Notes (Addendum)
Patient Name: Nicolas Ray Date of Encounter: 04/20/2015    SUBJECTIVE: We had a challenging discussion this morning. I forced a conversation relative to treatment options for severe multivessel coronary disease with LV dysfunction in the setting of diabetes.  The patient has had a direct conversation concerning that he has multivessel disease and decreased LV function, but is still in denial. He understands that survival benefits favor CABG over percutaneous intervention. He also understands that PCI would be a simple less risky immediate treatment option. He also understands the absolute necessity of dual antiplatelets therapy,  indefinitely if stented. He is not very convincing relative to reference for long-term compliance with medication or risk factor modification. After a long challenging conversation, he was to further discuss bypass surgical options with TCT S.  TELEMETRY:  Normal sinus rhythm: Filed Vitals:   04/20/15 0500 04/20/15 0600 04/20/15 0723 04/20/15 0809  BP:   105/64 105/64  Pulse: 83 77 82 79  Temp:   98 F (36.7 C)   TempSrc:   Oral   Resp: 23 22 19    Height:      Weight:      SpO2: 98% 95% 97%     Intake/Output Summary (Last 24 hours) at 04/20/15 0934 Last data filed at 04/20/15 0800  Gross per 24 hour  Intake    600 ml  Output    125 ml  Net    475 ml   LABS: Basic Metabolic Panel:  Recent Labs  04/18/15 1140 04/19/15 0245  NA 135 136  K 4.3 3.9  CL 103 104  CO2 22 24  GLUCOSE 205* 162*  BUN 21* 14  CREATININE 1.27* 1.22  CALCIUM 9.4 9.1   CBC:  Recent Labs  04/18/15 1140 04/18/15 1916 04/19/15 0245  WBC 9.1  --  11.7*  HGB 13.2  --  12.4*  HCT 39.8  --  37.7*  MCV 67.6*  --  66.4*  PLT 185 176 187   Cardiac Enzymes:  Recent Labs  04/18/15 1422 04/18/15 1916 04/19/15 0245  TROPONINI 39.96* >65.00* >65.00*   BNP: Invalid input(s): POCBNP Hemoglobin A1C:  Recent Labs  04/18/15 1422  HGBA1C 8.5*   Fasting Lipid  Panel:  Recent Labs  04/19/15 0245  CHOL 173  HDL 28*  LDLCALC 96  TRIG 243*  CHOLHDL 6.2    Radiology/Studies:  No new data  Physical Exam: Blood pressure 105/64, pulse 79, temperature 98 F (36.7 C), temperature source Oral, resp. rate 19, height 5\' 8"  (1.727 m), weight 91.8 kg (202 lb 6.1 oz), SpO2 97 %. Weight change: -0.2 kg (-7.1 oz)  Wt Readings from Last 3 Encounters:  04/20/15 91.8 kg (202 lb 6.1 oz)  04/18/14 92.534 kg (204 lb)  01/16/14 92.08 kg (203 lb)    S4 gallop on auscultation Flat neck veins Clear chest No access site complications Flat affect on neurological exam  ASSESSMENT:  1. Coronary artery disease, multivessel, with decreased LV function in the setting of diabetes. 2. Diabetes mellitus 2, poorly controlled with patient in denial concerning the presence of the disease. 3. Hypothyroidism, untreated 4. Tobacco abuse 5. 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 TCTS has been contacted. After discussion, if patient agrees to surgery, we will discontinue Effient and begin Cangrelor while we wait for surgery late next week. If the decides against surgery, hopefully we can get LAD and RCA PCI mid-to-late afternoon. 2. Continue nothing by mouth 3. Continue IV nitroglycerin and  titrate if recurrent chest discomfort 4. Aggressive risk factor modification, education, and encouragement for compliance with recommendations.  Demetrios Isaacs 04/20/2015, 9:34 AM

## 2015-04-20 NOTE — Progress Notes (Signed)
CARDIAC REHAB PHASE I   PRE:  Rate/Rhythm: 77 SR  BP:  Sitting: 130/76        SaO2: 99 RA  MODE:  Ambulation: 700 ft   POST:  Rate/Rhythm: 78 SR  BP:  Sitting: 136/72         SaO2: 100 RA  Pt initially declined ambulation, with encouragement, ultimately agreeable. Pt ambulated 700 ft on RA, independent, steady gait, tolerated well. Pt denies CP, dizziness, DOE, declined rest stop. Pt states in reference to his heart attack and when asked how he feels about proposed treatment options, pt states "I still can't believe this happened to me. I will do what I have to do I guess but I don't like it." Will defer further education until treatment plan is clear. Pt states he will think about quitting smoking. Pt to recliner after walk, call bell within reach. Will follow.  [phone]   Lenna Sciara, RN, BSN 04/20/2015 12:41 PM

## 2015-04-20 NOTE — Progress Notes (Signed)
Pt was seen this am by Dr. Tamala Julian. Based on his coronary anatomy, it was felt that bypass surgery should be considered. He presented 48 hours ago with an acute anterolateral STEMI and a DES was placed by Dr. Burt Knack. The lesion in the mid LAD and mid RCA are felt to be critical. Dr. Servando Snare saw the pt today and discussed bypass surgery as an option for revascularization given multi-vessel CAD and DM. The patient refused to consider bypass surgery so our plan is to proceed with PCI of the LAD and RCA today. The patient understands that he must be compliant with medical therapy if multiple stents are placed.   Nicolas Ray 04/20/2015 2:40 PM

## 2015-04-20 NOTE — Consult Note (Signed)
[address]       [place] [address]             [id number]        Nicolas Ray Medical Record #[id number] Date of Birth: Jul 11, 1951  Referring: No ref. provider found Primary Care: Angelica Chessman, MD  Chief Complaint:    Chief Complaint  Patient presents with  . Chest Pain    History of Present Illness:        Current Activity/ Functional Status: Patient is independent with mobility/ambulation, transfers, ADL's, IADL's.   Zubrod Score: At the time of surgery this patient's most appropriate activity status/level should be described as: [x]     0    Normal activity, no symptoms []     1    Restricted in physical strenuous activity but ambulatory, able to do out light work []     2    Ambulatory and capable of self care, unable to do work activities, up and about                 more than 50%  Of the time                            []     3    Only limited self care, in bed greater than 50% of waking hours []     4    Completely disabled, no self care, confined to bed or chair []     5    Moribund  Past Medical History  Diagnosis Date  . Unspecified essential hypertension 02/08/2013  . Thyroid disease   . Depression   . Diabetes mellitus without complication   . Acute MI, lateral wall, initial episode of care 04/18/2015    Past Surgical History  Procedure Laterality Date  . No prior surgery    . Cardiac catheterization N/A 04/18/2015    Procedure: Left Heart Cath and Coronary Angiography;  Surgeon: Sherren Mocha, MD;  Location: [facility];  Service: Cardiovascular;  Laterality: N/A;  . Cardiac catheterization N/A 04/18/2015    Procedure: Coronary Stent Intervention;  Surgeon: Sherren Mocha, MD;  Location: [facility];  Service: Cardiovascular;  Laterality: N/A;    History  Smoking status  . Light Tobacco Smoker -- 0.25 packs/day  . Types: Cigarettes  Smokeless tobacco  . Never Used    History  Alcohol Use No     Social History   Social History  . Marital Status: Divorced    Spouse Name: N/A  . Number of Children: N/A  . Years of Education: N/A   Occupational History  . Not on file.   Social History Main Topics  . Smoking status: Light Tobacco Smoker -- 0.25 packs/day    Types: Cigarettes  . Smokeless tobacco: Never Used  . Alcohol Use: No  . Drug Use: No  . Sexual Activity: Not on file   Other Topics Concern  . Not on file   Social History Narrative    Allergies  Allergen Reactions  . Peanut-Containing Drug Products Itching    Current Facility-Administered Medications  Medication Dose Route Frequency Provider Last Rate Last Dose  . 0.9 %  sodium chloride infusion  250 mL Intravenous PRN Sherren Mocha, MD      . 0.9 %  sodium chloride infusion  250 mL Intravenous PRN Burnell Blanks, MD      . 0.9 %  sodium chloride infusion   Intravenous Continuous Burnell Blanks, MD      . acetaminophen (TYLENOL) tablet 650 mg  650 mg Oral Q4H PRN Sherren Mocha, MD      . aspirin EC tablet 81 mg  81 mg Oral Daily Sherren Mocha, MD   81 mg at 04/20/15 1010  . atorvastatin (LIPITOR) tablet 80 mg  80 mg Oral q1800 Sherren Mocha, MD   80 mg at 04/19/15 1737  . carvedilol (COREG) tablet 6.25 mg  6.25 mg Oral BID WC Sherren Mocha, MD   6.25 mg at 04/20/15 0809  . diazepam (VALIUM) tablet 5 mg  5 mg Oral Q8H PRN Sherren Mocha, MD      . HYDROcodone-acetaminophen (NORCO/VICODIN) 5-325 MG per tablet 1 tablet  1 tablet Oral QHS PRN Sherren Mocha, MD      . insulin aspart (novoLOG) injection 0-15 Units  0-15 Units Subcutaneous TID [facility] Sherren Mocha, MD   5 Units at 04/20/15 1300  . levothyroxine (SYNTHROID, LEVOTHROID) tablet 25 mcg  25 mcg Oral QAC breakfast Belva Crome, MD   25 mcg at 04/20/15 [id number]  . lisinopril (PRINIVIL,ZESTRIL) tablet 20 mg  20 mg Oral Daily Belva Crome, MD   20 mg at 04/20/15 1010  . living well with diabetes book MISC   Does not apply Once Sherren Mocha, MD      . nitroGLYCERIN (NITROSTAT) SL tablet 0.4 mg  0.4 mg Sublingual Q5 min PRN Serita Grit, MD   0.4 mg at 04/18/15 1153  . ondansetron (ZOFRAN) injection 4 mg  4 mg Intravenous Q6H PRN Sherren Mocha, MD      . pneumococcal 23 valent vaccine (PNU-IMMUNE) injection 0.5 mL  0.5 mL Intramuscular Tomorrow-1000 Sherren Mocha, MD      . prasugrel (EFFIENT) tablet 10 mg  10 mg Oral Daily Sherren Mocha, MD   10 mg at 04/20/15 1259  . sodium chloride 0.9 % injection 3 mL  3 mL Intravenous Q12H Sherren Mocha, MD   3 mL at 04/20/15 1010  . sodium chloride 0.9 % injection 3 mL  3 mL Intravenous PRN Sherren Mocha, MD      . sodium chloride 0.9 % injection 3 mL  3 mL Intravenous Q12H Burnell Blanks, MD      . sodium chloride 0.9 % injection 3 mL  3 mL Intravenous PRN Burnell Blanks, MD      . traZODone (DESYREL) tablet 100 mg  100 mg Oral QHS PRN Alphia Moh, MD   100 mg at 04/19/15 2224    Prescriptions prior to admission  Medication Sig Dispense Refill Last Dose  . aspirin 81 MG tablet Take 81 mg by mouth 3 (three) times a week.    over 30 days  . Cholecalciferol (VITAMIN D-3) 5000 UNITS TABS Take 5,000 Units by mouth daily.    04/17/2015 at Unknown time    Family History  Problem Relation Age of Onset  . Heart disease Mother   . Cancer Father   . Colon cancer Neg Hx      Review of Systems:      Cardiac Review of Systems: Y or N  Chest Pain [ y   ]  Resting SOB [ n  ] Exertional SOB  [ y ]  Orthopnea [ n ]   Pedal Edema [n   ]    Palpitations [ n ] Syncope  [n  ]   Presyncope [ n  ]  General  Review of Systems: [Y] = yes [  ]=no Constitional: recent weight change [ n ]; anorexia [  ]; fatigue [  ]; nausea [  ]; night sweats [  ]; fever [  ]; or chills [  ]                                                               Dental: poor dentition[  n]; Last Dentist visit:   Eye : blurred vision [  ]; diplopia [   ]; vision changes [  ];  Amaurosis fugax[   ]; Resp: cough [  ];  wheezing[  ];  hemoptysis[  ]; shortness of breath[  ]; paroxysmal nocturnal dyspnea[  ]; dyspnea on exertion[  ]; or orthopnea[  ];  GI:  gallstones[  ], vomiting[  ];  dysphagia[  ]; melena[  ];  hematochezia [  ]; heartburn[  ];   Hx of  Colonoscopy[  ]; GU: kidney stones [  ]; hematuria[  ];   dysuria [  ];  nocturia[  ];  history of     obstruction [  ]; urinary frequency [  ]             Skin: rash, swelling[  ];, hair loss[  ];  peripheral edema[  ];  or itching[  ]; Musculosketetal: myalgias[  ];  joint swelling[  ];  joint erythema[  ];  joint pain[  ];  back pain[  ];  Heme/Lymph: bruising[  ];  bleeding[  ];  anemia[  ];  Neuro: TIA[ n ];  headaches[  ];  stroke[n  ];  vertigo[  ];  seizures[  ];   paresthesias[  ];  difficulty walking[  ];  Psych:depression[  ]; anxiety[  ];  Endocrine: diabetes[  ];  thyroid dysfunction[ labs this admission elevated TSH ];  Immunizations: Flu [?  ]; Pneumococcal[ ? ];  Other:  Physical Exam: BP 127/83 mmHg  Pulse 75  Temp(Src) 97.3 F (36.3 C) (Oral)  Resp 35  Ht 5\' 8"  (1.727 m)  Wt 202 lb 6.1 oz (91.8 kg)  BMI 30.78 kg/m2  SpO2 0%   General appearance: cooperative, appears older than stated age and no distress Head: Normocephalic, without obvious abnormality, atraumatic Neck: no adenopathy, no carotid bruit, no JVD, supple, symmetrical, trachea midline and thyroid not enlarged, symmetric, no tenderness/mass/nodules Lymph nodes: Cervical, supraclavicular, and axillary nodes normal. Resp: clear to auscultation bilaterally Back: symmetric, no curvature. ROM normal. No CVA tenderness. Cardio: regular rate and rhythm, S1, S2 normal, no murmur, click, rub or gallop GI: soft, non-tender; bowel sounds normal; no masses,  no organomegaly Extremities: extremities normal, atraumatic, no cyanosis or edema and Homans sign is negative, no sign of DVT, recent cath in right wrist Neurologic: Grossly normal,   Diagnostic  Studies & Laboratory data:     Recent Radiology Findings:    I have independently reviewed the above radiologic studies.  Recent Lab Findings: Lab Results  Component Value Date   WBC 11.7* 04/19/2015   HGB 12.4* 04/19/2015   HCT 37.7* 04/19/2015   PLT 187 04/19/2015   GLUCOSE 162* 04/19/2015   CHOL 173 04/19/2015   TRIG 243* 04/19/2015   HDL 28* 04/19/2015   LDLCALC 96 04/19/2015  ALT 16 10/13/2013   AST 14 10/13/2013   NA 136 04/19/2015   K 3.9 04/19/2015   CL 104 04/19/2015   CREATININE 1.22 04/19/2015   BUN 14 04/19/2015   CO2 24 04/19/2015   TSH 25.863* 04/18/2015   HGBA1C 8.5* 04/18/2015      Assessment / Plan:   Acute MI now with three vessel coronary artery disease  I have recommended to the patient to proceed with CABG, with 3 vessel cad severe and DM. I have explained the process and options . He does not wish to have surgery. "Have them put all the stents in me they can"  Diabetes - poor contro HBA1C 8.5 Hypothyroidism- TSH 25 Would recommend chest xray at some point in this admission since admitted with chest pain   I  spent 30 minutes counseling the patient face to face and 50% or more the  time was spent in counseling and coordination of care. The total time spent in the appointment was 60 minutes. Discussed with Cardiology ,     Grace Isaac MD      [address] [place], [address] Office [phone]   Beeper [phone]  04/20/2015 4:22 PM

## 2015-04-20 NOTE — H&P (View-Only) (Signed)
Patient Name: Nicolas Ray Date of Encounter: 04/20/2015    SUBJECTIVE: We had a challenging discussion this morning. I forced a conversation relative to treatment options for severe multivessel coronary disease with LV dysfunction in the setting of diabetes.  The patient has had a direct conversation concerning that he has multivessel disease and decreased LV function, but is still in denial. He understands that survival benefits favor CABG over percutaneous intervention. He also understands that PCI would be a simple less risky immediate treatment option. He also understands the absolute necessity of dual antiplatelets therapy,  indefinitely if stented. He is not very convincing relative to reference for long-term compliance with medication or risk factor modification. After a long challenging conversation, he was to further discuss bypass surgical options with TCT S.  TELEMETRY:  Normal sinus rhythm: Filed Vitals:   04/20/15 0500 04/20/15 0600 04/20/15 0723 04/20/15 0809  BP:   105/64 105/64  Pulse: 83 77 82 79  Temp:   98 F (36.7 C)   TempSrc:   Oral   Resp: 23 22 19    Height:      Weight:      SpO2: 98% 95% 97%     Intake/Output Summary (Last 24 hours) at 04/20/15 0934 Last data filed at 04/20/15 0800  Gross per 24 hour  Intake    600 ml  Output    125 ml  Net    475 ml   LABS: Basic Metabolic Panel:  Recent Labs  04/18/15 1140 04/19/15 0245  NA 135 136  K 4.3 3.9  CL 103 104  CO2 22 24  GLUCOSE 205* 162*  BUN 21* 14  CREATININE 1.27* 1.22  CALCIUM 9.4 9.1   CBC:  Recent Labs  04/18/15 1140 04/18/15 1916 04/19/15 0245  WBC 9.1  --  11.7*  HGB 13.2  --  12.4*  HCT 39.8  --  37.7*  MCV 67.6*  --  66.4*  PLT 185 176 187   Cardiac Enzymes:  Recent Labs  04/18/15 1422 04/18/15 1916 04/19/15 0245  TROPONINI 39.96* >65.00* >65.00*   BNP: Invalid input(s): POCBNP Hemoglobin A1C:  Recent Labs  04/18/15 1422  HGBA1C 8.5*   Fasting Lipid  Panel:  Recent Labs  04/19/15 0245  CHOL 173  HDL 28*  LDLCALC 96  TRIG 243*  CHOLHDL 6.2    Radiology/Studies:  No new data  Physical Exam: Blood pressure 105/64, pulse 79, temperature 98 F (36.7 C), temperature source Oral, resp. rate 19, height 5\' 8"  (1.727 m), weight 91.8 kg (202 lb 6.1 oz), SpO2 97 %. Weight change: -0.2 kg (-7.1 oz)  Wt Readings from Last 3 Encounters:  04/20/15 91.8 kg (202 lb 6.1 oz)  04/18/14 92.534 kg (204 lb)  01/16/14 92.08 kg (203 lb)    S4 gallop on auscultation Flat neck veins Clear chest No access site complications Flat affect on neurological exam  ASSESSMENT:  1. Coronary artery disease, multivessel, with decreased LV function in the setting of diabetes. 2. Diabetes mellitus 2, poorly controlled with patient in denial concerning the presence of the disease. 3. Hypothyroidism, untreated 4. Tobacco abuse 5. 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 TCTS has been contacted. After discussion, if patient agrees to surgery, we will discontinue Effient and begin Cangrelor while we wait for surgery late next week. If the decides against surgery, hopefully we can get LAD and RCA PCI mid-to-late afternoon. 2. Continue nothing by mouth 3. Continue IV nitroglycerin and  titrate if recurrent chest discomfort 4. Aggressive risk factor modification, education, and encouragement for compliance with recommendations.  Demetrios Isaacs 04/20/2015, 9:34 AM

## 2015-04-20 NOTE — Interval H&P Note (Signed)
History and Physical Interval Note:  04/20/2015 2:35 PM  Nicolas Ray  has presented today for cardiac cath/PCI of the RCA and LAD with the diagnosis of CAD. The various methods of treatment have been discussed with the patient and family. After consideration of risks, benefits and other options for treatment, the patient has consented to  Procedure(s): Coronary Stent Intervention (N/A) as a surgical intervention .  The patient's history has been reviewed, patient examined, no change in status, stable for surgery.  I have reviewed the patient's chart and labs.  Questions were answered to the patient's satisfaction.    Cath Lab Visit (complete for each Cath Lab visit)  Clinical Evaluation Leading to the Procedure:   ACS: Yes.    Non-ACS:    Anginal Classification: CCS III  Anti-ischemic medical therapy: Maximal Therapy (2 or more classes of medications)  Non-Invasive Test Results: No non-invasive testing performed  Prior CABG: No previous CABG         MCALHANY,CHRISTOPHER

## 2015-04-21 DIAGNOSIS — N179 Acute kidney failure, unspecified: Secondary | ICD-10-CM

## 2015-04-21 LAB — BASIC METABOLIC PANEL
Anion gap: 11 (ref 5–15)
BUN: 26 mg/dL — ABNORMAL HIGH (ref 6–20)
CALCIUM: 8.6 mg/dL — AB (ref 8.9–10.3)
CO2: 21 mmol/L — AB (ref 22–32)
CREATININE: 1.68 mg/dL — AB (ref 0.61–1.24)
Chloride: 101 mmol/L (ref 101–111)
GFR calc non Af Amer: 41 mL/min — ABNORMAL LOW (ref >60)
GFR, EST AFRICAN AMERICAN: 48 mL/min — AB (ref >60)
GLUCOSE: 205 mg/dL — AB (ref 65–99)
Potassium: 3.8 mmol/L (ref 3.5–5.1)
SODIUM: 133 mmol/L — AB (ref 135–145)

## 2015-04-21 LAB — CBC
HCT: 33.4 % — ABNORMAL LOW (ref 39.0–52.0)
HEMOGLOBIN: 10.8 g/dL — AB (ref 13.0–17.0)
MCH: 21.9 pg — AB (ref 26.0–34.0)
MCHC: 32.3 g/dL (ref 30.0–36.0)
MCV: 67.7 fL — AB (ref 78.0–100.0)
PLATELETS: 157 10*3/uL (ref 150–400)
RBC: 4.93 MIL/uL (ref 4.22–5.81)
RDW: 14.5 % (ref 11.5–15.5)
WBC: 10.2 10*3/uL (ref 4.0–10.5)

## 2015-04-21 LAB — GLUCOSE, CAPILLARY
GLUCOSE-CAPILLARY: 175 mg/dL — AB (ref 65–99)
GLUCOSE-CAPILLARY: 251 mg/dL — AB (ref 65–99)
GLUCOSE-CAPILLARY: 169 mg/dL — AB (ref 65–99)
Glucose-Capillary: 173 mg/dL — ABNORMAL HIGH (ref 65–99)
Glucose-Capillary: 183 mg/dL — ABNORMAL HIGH (ref 65–99)

## 2015-04-21 MED ORDER — MAGNESIUM HYDROXIDE 400 MG/5ML PO SUSP
30.0000 mL | Freq: Every day | ORAL | Status: DC | PRN
  Administered 2015-04-21: 30 mL via ORAL
  Filled 2015-04-21 (×2): qty 30

## 2015-04-21 MED ORDER — SODIUM CHLORIDE 0.9 % IV SOLN
INTRAVENOUS | Status: AC
  Administered 2015-04-21 (×2): via INTRAVENOUS

## 2015-04-21 NOTE — Progress Notes (Signed)
Patient ID: Nicolas Ray, male   DOB: 1951-02-27, 64 y.o.   MRN: [id number]   SUBJECTIVE: "[place]winge" of pain last night.  Otherwise, feels fine.  Creatinine a bit higher this morning.   Scheduled Meds: . aspirin EC  81 mg Oral Daily  . atorvastatin  80 mg Oral q1800  . carvedilol  6.25 mg Oral BID WC  . insulin aspart  0-15 Units Subcutaneous TID WC  . levothyroxine  25 mcg Oral QAC breakfast  . living well with diabetes book   Does not apply Once  . prasugrel  10 mg Oral Daily  . sodium chloride  3 mL Intravenous Q12H  . sodium chloride  3 mL Intravenous Q12H   Continuous Infusions:  PRN Meds:.sodium chloride, sodium chloride, acetaminophen, diazepam, HYDROcodone-acetaminophen, magnesium hydroxide, nitroGLYCERIN, ondansetron (ZOFRAN) IV, sodium chloride, sodium chloride, traZODone    Filed Vitals:   04/21/15 0700 04/21/15 0749 04/21/15 0800 04/21/15 0900  BP: 142/76  139/79 153/79  Pulse: 79  82 85  Temp:  98.2 F (36.8 C)    TempSrc:  Oral    Resp: 21  19 25   Height:      Weight:      SpO2: 98%  96% 95%    Intake/Output Summary (Last 24 hours) at 04/21/15 0945 Last data filed at 04/21/15 0800  Gross per 24 hour  Intake   1343 ml  Output    550 ml  Net    793 ml    LABS: Basic Metabolic Panel:  Recent Labs  04/19/15 0245 04/21/15 0221  NA 136 133*  K 3.9 3.8  CL 104 101  CO2 24 21*  GLUCOSE 162* 205*  BUN 14 26*  CREATININE 1.22 1.68*  CALCIUM 9.1 8.6*   Liver Function Tests: No results for input(s): AST, ALT, ALKPHOS, BILITOT, PROT, ALBUMIN in the last 72 hours. No results for input(s): LIPASE, AMYLASE in the last 72 hours. CBC:  Recent Labs  04/19/15 0245 04/21/15 0221  WBC 11.7* 10.2  HGB 12.4* 10.8*  HCT 37.7* 33.4*  MCV 66.4* 67.7*  PLT 187 157   Cardiac Enzymes:  Recent Labs  04/18/15 1422 04/18/15 1916 04/19/15 0245  TROPONINI 39.96* >65.00* >65.00*   BNP: Invalid input(s): POCBNP D-Dimer: No results for input(s): DDIMER in the last  72 hours. Hemoglobin A1C:  Recent Labs  04/18/15 1422  HGBA1C 8.5*   Fasting Lipid Panel:  Recent Labs  04/19/15 0245  CHOL 173  HDL 28*  LDLCALC 96  TRIG 243*  CHOLHDL 6.2   Thyroid Function Tests:  Recent Labs  04/18/15 1422  TSH 25.863*   Anemia Panel: No results for input(s): VITAMINB12, FOLATE, FERRITIN, TIBC, IRON, RETICCTPCT in the last 72 hours.  RADIOLOGY: No results found.  PHYSICAL EXAM General: NAD Neck: No JVD, no thyromegaly or thyroid nodule.  Lungs: Clear to auscultation bilaterally with normal respiratory effort. CV: Nondisplaced PMI.  Heart regular S1/S2, no S3/S4, no murmur.  No peripheral edema.  No carotid bruit.  Normal pedal pulses.  Abdomen: Soft, nontender, no hepatosplenomegaly, no distention.  Neurologic: Alert and oriented x 3.  Psych: Normal affect. Extremities: No clubbing or cyanosis.   TELEMETRY: Reviewed telemetry pt in NSR  ASSESSMENT AND PLAN: 64 yo presented with anterolateral STEMI, had DES to D1.  Given residual severe disease in LAD and RCA, CABG recommended.  Patient refused CABG so he had DES to LAD and DES to RCA 8/19.  1. CAD: S/p anterolateral MI and staged multivessel PCIs.  No further chest pain.  - Continue ASA 81 and Brilinta.  - Continue atorvastatin 80 daily 2. Ischemic cardiomyopathy: No heart failure symptoms.  He is on Coreg 6.25 mg bid and lisinopril.  Hold lisinopril for now with rise in creatinine.  3. AKI: Suspect contrast nephropathy with rise in creatinine from 1.27 => 1.68.  He is not volume overloaded, gentle hydration and hold lisinopril.  4. Ambulate with cardiac rehab.  5. Home probably tomorrow if remains stable.  May go to telemetry.   Loralie Champagne 04/21/2015 9:49 AM

## 2015-04-21 NOTE — Progress Notes (Signed)
CARDIAC REHAB PHASE I   PRE:  Rate/Rhythm: 87 SR    BP: sitting 93/59    SaO2:   MODE:  Ambulation: 350 ft   POST:  Rate/Rhythm: 91 Sr    BP: sitting 106/68     SaO2:   Tolerated well, no c/o. Sts" I feel fine".  Pt continues to not understand his "disease", he is surprised he had a heart attack. He stated numerous times "How am I to live with this kind of life" in regards to taking medicine. Encouraged pt that he can still have quality of life and encouraged him to do CRPII. He agreed to referral being sent to [place]. Pt is thinking about quitting smoking but sts "he has to get through this first". Discussed the risks of smoking. Pt needs continued reiteration of his disease and plan going forward. Pt does seem to understand importance of Effient but only for 1 month and was surprised by needing ASA or other meds. Reiterated again Effient and ASA for atleast 12 months. Has booklet. [phone]  Josephina Shih [place] CES, ACSM 04/21/2015 12:10 PM

## 2015-04-21 NOTE — Progress Notes (Signed)
TR band removed from pt's R wrist at 2130. Level 1 with bruising noted. +2 pulse noted. Pressure dressing applied. Will continue to monitor.

## 2015-04-22 LAB — CBC
HEMATOCRIT: 32 % — AB (ref 39.0–52.0)
Hemoglobin: 10.5 g/dL — ABNORMAL LOW (ref 13.0–17.0)
MCH: 21.9 pg — AB (ref 26.0–34.0)
MCHC: 32.8 g/dL (ref 30.0–36.0)
MCV: 66.8 fL — AB (ref 78.0–100.0)
PLATELETS: 143 10*3/uL — AB (ref 150–400)
RBC: 4.79 MIL/uL (ref 4.22–5.81)
RDW: 14.3 % (ref 11.5–15.5)
WBC: 8.8 10*3/uL (ref 4.0–10.5)

## 2015-04-22 LAB — BASIC METABOLIC PANEL
Anion gap: 9 (ref 5–15)
BUN: 33 mg/dL — AB (ref 6–20)
CHLORIDE: 104 mmol/L (ref 101–111)
CO2: 22 mmol/L (ref 22–32)
CREATININE: 1.79 mg/dL — AB (ref 0.61–1.24)
Calcium: 8.5 mg/dL — ABNORMAL LOW (ref 8.9–10.3)
GFR calc Af Amer: 44 mL/min — ABNORMAL LOW (ref >60)
GFR calc non Af Amer: 38 mL/min — ABNORMAL LOW (ref >60)
GLUCOSE: 146 mg/dL — AB (ref 65–99)
Potassium: 3.9 mmol/L (ref 3.5–5.1)
SODIUM: 135 mmol/L (ref 135–145)

## 2015-04-22 LAB — GLUCOSE, CAPILLARY
GLUCOSE-CAPILLARY: 183 mg/dL — AB (ref 65–99)
GLUCOSE-CAPILLARY: 137 mg/dL — AB (ref 65–99)
Glucose-Capillary: 175 mg/dL — ABNORMAL HIGH (ref 65–99)
Glucose-Capillary: 161 mg/dL — ABNORMAL HIGH (ref 65–99)

## 2015-04-22 MED ORDER — SODIUM CHLORIDE 0.9 % IV SOLN
INTRAVENOUS | Status: DC
  Administered 2015-04-22 – 2015-04-23 (×3): via INTRAVENOUS

## 2015-04-22 NOTE — Progress Notes (Signed)
Patient ID: Nicolas Ray, male   DOB: 25-Sep-1950, 64 y.o.   MRN: [id number]   SUBJECTIVE: "Feels well, walked with no CP or SOB.  Creatinine a bit higher this morning.   Scheduled Meds: . aspirin EC  81 mg Oral Daily  . atorvastatin  80 mg Oral q1800  . carvedilol  6.25 mg Oral BID WC  . insulin aspart  0-15 Units Subcutaneous TID WC  . levothyroxine  25 mcg Oral QAC breakfast  . living well with diabetes book   Does not apply Once  . prasugrel  10 mg Oral Daily  . sodium chloride  3 mL Intravenous Q12H  . sodium chloride  3 mL Intravenous Q12H   Continuous Infusions:  PRN Meds:.sodium chloride, sodium chloride, acetaminophen, diazepam, HYDROcodone-acetaminophen, magnesium hydroxide, nitroGLYCERIN, ondansetron (ZOFRAN) IV, sodium chloride, sodium chloride, traZODone   Filed Vitals:   04/21/15 1240 04/21/15 1718 04/21/15 2100 04/22/15 0500  BP: 110/68 105/60 127/71 112/64  Pulse: 86 89 74 71  Temp: 97.6 F (36.4 C)  98 F (36.7 C) 98.5 F (36.9 C)  TempSrc: Oral  Oral Oral  Resp: 20     Height:      Weight:      SpO2: 98%  99% 95%    Intake/Output Summary (Last 24 hours) at 04/22/15 0915 Last data filed at 04/21/15 1635  Gross per 24 hour  Intake 691.25 ml  Output      0 ml  Net 691.25 ml    LABS: Basic Metabolic Panel:  Recent Labs  04/21/15 0221 04/22/15 0318  NA 133* 135  K 3.8 3.9  CL 101 104  CO2 21* 22  GLUCOSE 205* 146*  BUN 26* 33*  CREATININE 1.68* 1.79*  CALCIUM 8.6* 8.5*    Recent Labs  04/21/15 0221 04/22/15 0318  WBC 10.2 8.8  HGB 10.8* 10.5*  HCT 33.4* 32.0*  MCV 67.7* 66.8*  PLT 157 143*    RADIOLOGY: No results found.  PHYSICAL EXAM General: NAD Neck: No JVD, no thyromegaly or thyroid nodule.  Lungs: Clear to auscultation bilaterally with normal respiratory effort. CV: Nondisplaced PMI.  Heart regular S1/S2, no S3/S4, no murmur.  No peripheral edema.  No carotid bruit.  Normal pedal pulses.  Abdomen: Soft, nontender, no  hepatosplenomegaly, no distention.  Neurologic: Alert and oriented x 3.  Psych: Normal affect. Extremities: No clubbing or cyanosis.   TELEMETRY: Reviewed telemetry pt in NSR   ASSESSMENT AND PLAN:  64 yo presented with anterolateral STEMI, had DES to D1.  Given residual severe disease in LAD and RCA, CABG recommended.  Patient refused CABG so he had DES to LAD and DES to RCA 8/19.  1. CAD: S/p anterolateral MI and staged multivessel PCIs.  No further chest pain.  - Continue ASA 81 and Brilinta.  - Continue atorvastatin 80 daily 2. Ischemic cardiomyopathy: No heart failure symptoms.  He is on Coreg 6.25 mg bid and lisinopril.  Hold lisinopril for now with rise in creatinine.  3. AKI: Suspect contrast nephropathy with rise in creatinine from 1.27 => 1.68-->.  He is not volume overloaded, gentle hydration and hold lisinopril. If Crea improves, anticipated discharge tomorrow. 4. Ambulate with cardiac rehab.  5. Home probably tomorrow if remains stable.  May go to telemetry.   Dorothy Spark 04/22/2015 9:15 AM

## 2015-04-23 ENCOUNTER — Encounter (HOSPITAL_COMMUNITY): Payer: Self-pay | Admitting: Cardiovascular Disease

## 2015-04-23 LAB — GLUCOSE, CAPILLARY
GLUCOSE-CAPILLARY: 137 mg/dL — AB (ref 65–99)
GLUCOSE-CAPILLARY: 192 mg/dL — AB (ref 65–99)
Glucose-Capillary: 160 mg/dL — ABNORMAL HIGH (ref 65–99)
Glucose-Capillary: 155 mg/dL — ABNORMAL HIGH (ref 65–99)

## 2015-04-23 MED FILL — Heparin Sodium (Porcine) 2 Unit/ML in Sodium Chloride 0.9%: INTRAMUSCULAR | Qty: 1500 | Status: AC

## 2015-04-23 MED FILL — Nitroglycerin IV Soln 100 MCG/ML in D5W: INTRA_ARTERIAL | Qty: 10 | Status: AC

## 2015-04-23 NOTE — Progress Notes (Signed)
Patient Profile: 64 yo presented with anterolateral STEMI, had DES to D1. Given residual severe disease in LAD and RCA, CABG recommended. Patient refused CABG so he had DES to LAD and DES to RCA 8/19.   Subjective: No complaints. Denies any recurrent CP. No dyspnea. Ambulating w/o difficulty.   Objective: Vital signs in last 24 hours: Temp:  [97.7 F (36.5 C)-98.3 F (36.8 C)] 98.3 F (36.8 C) (08/22 0349) Pulse Rate:  [70-75] 75 (08/22 0811) Resp:  [15-18] 16 (08/22 0349) BP: (115-132)/(69-76) 115/69 mmHg (08/22 0811) SpO2:  [96 %-100 %] 96 % (08/22 0349) Weight:  [210 lb 11.2 oz (95.573 kg)] 210 lb 11.2 oz (95.573 kg) (08/22 0349) Last BM Date: 04/22/15  Intake/Output from previous day: 08/21 0701 - 08/22 0700 In: 1126.8 [P.O.:390; I.V.:736.8] Out: 500 [Urine:500] Intake/Output this shift:    Medications Current Facility-Administered Medications  Medication Dose Route Frequency Provider Last Rate Last Dose  . 0.9 %  sodium chloride infusion  250 mL Intravenous PRN Sherren Mocha, MD      . 0.9 %  sodium chloride infusion  250 mL Intravenous PRN Burnell Blanks, MD      . 0.9 %  sodium chloride infusion   Intravenous Continuous Dorothy Spark, MD 75 mL/hr at 04/23/15 0600    . acetaminophen (TYLENOL) tablet 650 mg  650 mg Oral Q4H PRN Sherren Mocha, MD   650 mg at 04/23/15 0345  . aspirin EC tablet 81 mg  81 mg Oral Daily Sherren Mocha, MD   81 mg at 04/23/15 0815  . atorvastatin (LIPITOR) tablet 80 mg  80 mg Oral q1800 Sherren Mocha, MD   80 mg at 04/22/15 1730  . carvedilol (COREG) tablet 6.25 mg  6.25 mg Oral BID WC Sherren Mocha, MD   6.25 mg at 04/23/15 6237  . diazepam (VALIUM) tablet 5 mg  5 mg Oral Q8H PRN Sherren Mocha, MD      . HYDROcodone-acetaminophen (NORCO/VICODIN) 5-325 MG per tablet 1 tablet  1 tablet Oral QHS PRN Sherren Mocha, MD      . insulin aspart (novoLOG) injection 0-15 Units  0-15 Units Subcutaneous TID [facility] Sherren Mocha, MD    2 Units at 04/23/15 [phone]  . levothyroxine (SYNTHROID, LEVOTHROID) tablet 25 mcg  25 mcg Oral QAC breakfast Belva Crome, MD   25 mcg at 04/23/15 0810  . living well with diabetes book MISC   Does not apply Once Sherren Mocha, MD      . magnesium hydroxide (MILK OF MAGNESIA) suspension 30 mL  30 mL Oral Daily PRN Sherren Mocha, MD   30 mL at 04/21/15 0926  . nitroGLYCERIN (NITROSTAT) SL tablet 0.4 mg  0.4 mg Sublingual Q5 min PRN Serita Grit, MD   0.4 mg at 04/18/15 1153  . ondansetron (ZOFRAN) injection 4 mg  4 mg Intravenous Q6H PRN Sherren Mocha, MD      . prasugrel (EFFIENT) tablet 10 mg  10 mg Oral Daily Sherren Mocha, MD   10 mg at 04/23/15 0810  . sodium chloride 0.9 % injection 3 mL  3 mL Intravenous Q12H Sherren Mocha, MD   3 mL at 04/21/15 2200  . sodium chloride 0.9 % injection 3 mL  3 mL Intravenous PRN Sherren Mocha, MD      . sodium chloride 0.9 % injection 3 mL  3 mL Intravenous Q12H Burnell Blanks, MD   0 mL at 04/20/15 2000  . sodium chloride 0.9 % injection 3  mL  3 mL Intravenous PRN Burnell Blanks, MD      . traZODone (DESYREL) tablet 100 mg  100 mg Oral QHS PRN Alphia Moh, MD   100 mg at 04/21/15 2121    PE: General appearance: alert, cooperative and no distress Neck: no carotid bruit and no JVD Lungs: clear to auscultation bilaterally Heart: regular rate and rhythm, S1, S2 normal, no murmur, click, rub or gallop Extremities: no LEE Pulses: 2+ and symmetric Skin: warm and dry Neurologic: Grossly normal  Lab Results:   Recent Labs  04/21/15 0221 04/22/15 0318  WBC 10.2 8.8  HGB 10.8* 10.5*  HCT 33.4* 32.0*  PLT 157 143*   BMET  Recent Labs  04/21/15 0221 04/22/15 0318  NA 133* 135  K 3.8 3.9  CL 101 104  CO2 21* 22  GLUCOSE 205* 146*  BUN 26* 33*  CREATININE 1.68* 1.79*  CALCIUM 8.6* 8.5*     Assessment/Plan   Active Problems:   Hypothyroidism   Essential hypertension   Acute MI, lateral wall, initial episode of  care   ST elevation myocardial infarction (STEMI) of lateral wall   Type II diabetes mellitus with complication   Acute systolic heart failure   Coronary artery disease involving native coronary artery of native heart with unstable angina pectoris   1. CAD: S/p anterolateral MI and staged multivessel PCIs. No further chest pain.  - Continue ASA 81 and Brilinta.  - Continue atorvastatin 80 daily - continue BB therapy with Coreg.   2. Ischemic cardiomyopathy: EF 40-45%.  No heart failure symptoms. He is on Coreg 6.25 mg bid. Continue to hold lisinopril for now with rise in creatinine. restart as an outpatient if renal function is improved.   3. AKI: Suspect contrast nephropathy with rise in creatinine from 1.27 => 1.68-->1.79. continue to hold lisinopril. Will defer to MD timing of discharge. Will need f/u BMP in the office after discharge.      LOS: 5 days    Brittainy M. Ladoris Gene 04/23/2015 9:34 AM  Attending Note:   The patient was seen and examined.  Agree with assessment and plan as noted above.  Changes made to the above note as needed.  1. CAD - doing well. S/p multivessel / staged stenting Patient did not want to have CABG.   2. Acute on chronic kidney disease - will need to see creatinine start decreaseing . Agree with holding lisinopril for now   3. Hyperlipidemia: Continue atorvastatin   4. DM - continue insulin    Thayer Headings, Brooke Bonito., MD, [place] 04/23/2015, 10:28 AM [address]  [place] Pager [phone]

## 2015-04-23 NOTE — Progress Notes (Signed)
CARDIAC REHAB PHASE I   PRE:  Rate/Rhythm: 69 SR  BP:  Sitting: 128/74        SaO2: 98 RS  MODE:  Ambulation: 550 ft   POST:  Rate/Rhythm: 80 sR  BP:  Sitting: 132/74          SaO2: 99 RA  Pt ambulated 550 ft on RA, IV, independent, slow, steady gait, tolerated well. When asked, pt continuously states "I'm fine." Pt denies CP, dizziness, DOE, does state his legs feel "stiff" and he is tired from not sleeping at night. Pt states he has been reading his educational materials and states he has no questions at this time. Pt encouraged to ambulate as tolerated, pt verbalized understanding. Pt to recliner after walk, call bell within reach.   1030-1100  Lenna Sciara, RN, BSN 04/23/2015 10:57 AM

## 2015-04-23 NOTE — Care Management Note (Addendum)
Case Management Note  Patient Details  Name: Nicolas Ray MRN: [id number] Date of Birth: 12/08/1950  Subjective/Objective: Pt admitted for Acute MI. Pt is listed as being without insurance. CM did speak with pt and he has a [facility] Rx drug Card. CM did a benefits check for medication effient co pay. Care Teams in system states pt utilizes the [facility] and [facility]. Per pt he has not used the clinic in a while. CM did place number to the clinic on the  AVS for hospital f/u appointment.                   Action/Plan: Pt has Effient card for 30 day free. Pt will need a Rx for 30 day free no refills and the original Rx with refills. [place] check in process and will pt aware once completed. CM did call [facility] [address] and stated they are no longer in contract with [facility]. Unable to give me a price over the phone.   No further needs at this time.    Expected Discharge Date:                  Expected Discharge Plan:  Home/Self Care  In-House Referral:     Discharge planning Services  CM Consult, Medication Assistance, Follow-up appt scheduled, [place] Acute Care Choice:  NA Choice offered to:  NA  DME Arranged:  N/A DME Agency:  NA  HH Arranged:  NA HH Agency:     Status of Service:  Completed, signed off  Medicare Important Message Given:    Date Medicare IM Given:    Medicare IM give by:    Date Additional Medicare IM Given:    Additional Medicare Important Message give by:     If discussed at [place] of Stay Meetings, dates discussed:    Additional Comments:    04-23-15 [address] [place] [phone] Per rep at [facility] rx:   Effient:  Tier 4/ $160 at retail/ no auth required   Brilinta:  Tier 4/ $160 at retail/ auth required [phone]   Plavix (generic):  Tier 1/ $10 at retail/ no auth required   CM did speak with pt in regards to medication. Pt states he will not be able to afford Effient. CM did state to pt that if he  f/u at the [facility] they have pharmacy on site that could assist with medications. CM will leave patient assistance form on shadow chart for MD to address. No further needs at this time.   Bethena Roys, RN 04/23/2015, 10:53 AM

## 2015-04-24 ENCOUNTER — Telehealth: Payer: Self-pay | Admitting: Physician Assistant

## 2015-04-24 ENCOUNTER — Telehealth: Payer: Self-pay

## 2015-04-24 LAB — BASIC METABOLIC PANEL
Anion gap: 8 (ref 5–15)
BUN: 25 mg/dL — AB (ref 6–20)
CHLORIDE: 109 mmol/L (ref 101–111)
CO2: 18 mmol/L — ABNORMAL LOW (ref 22–32)
CREATININE: 1.34 mg/dL — AB (ref 0.61–1.24)
Calcium: 8.7 mg/dL — ABNORMAL LOW (ref 8.9–10.3)
GFR calc Af Amer: >60 mL/min (ref >60)
GFR calc non Af Amer: 54 mL/min — ABNORMAL LOW (ref >60)
GLUCOSE: 155 mg/dL — AB (ref 65–99)
Potassium: 4.2 mmol/L (ref 3.5–5.1)
SODIUM: 135 mmol/L (ref 135–145)

## 2015-04-24 LAB — GLUCOSE, CAPILLARY
GLUCOSE-CAPILLARY: 152 mg/dL — AB (ref 65–99)
Glucose-Capillary: 171 mg/dL — ABNORMAL HIGH (ref 65–99)

## 2015-04-24 MED ORDER — ATORVASTATIN CALCIUM 80 MG PO TABS: 80.0000 mg | ORAL_TABLET | Freq: Every day | ORAL | Status: DC

## 2015-04-24 MED ORDER — PRASUGREL HCL 10 MG PO TABS: 10.0000 mg | ORAL_TABLET | Freq: Every day | ORAL | Status: DC

## 2015-04-24 MED ORDER — NITROGLYCERIN 0.4 MG SL SUBL: 0.4000 mg | SUBLINGUAL_TABLET | SUBLINGUAL | Status: DC | PRN

## 2015-04-24 MED ORDER — CARVEDILOL 6.25 MG PO TABS: 6.2500 mg | ORAL_TABLET | Freq: Two times a day (BID) | ORAL | Status: DC

## 2015-04-24 MED ORDER — LEVOTHYROXINE SODIUM 50 MCG PO TABS: 50.0000 ug | ORAL_TABLET | Freq: Every day | ORAL | Status: DC

## 2015-04-24 NOTE — Discharge Summary (Signed)
Physician Discharge Summary  Patient ID: Nicolas Ray MRN: [id number] DOB/AGE: [birth year] 64 y.o.   Primary Cardiologist: Dr. Burt Knack  Admit date: 04/18/2015 Discharge date: 04/24/2015  Admission Diagnoses: STEMI  Discharge Diagnoses:  Active Problems:   Hypothyroidism   Essential hypertension   Acute MI, lateral wall, initial episode of care   ST elevation myocardial infarction (STEMI) of lateral wall   Type II diabetes mellitus with complication   Acute systolic heart failure   Coronary artery disease involving native coronary artery of native heart with unstable angina pectoris   Discharged Condition: stable  Hospital Course: The patient is a 64 y/o male with PMH significant for DM and HTN, who presented to [facility] ED on 04/18/15 with a complaint of chest pain. EKG demonstrated lateral ST elevations. He was transported urgently to [facility] for urgent [facility]. Initial cath 04/17/17, performed by Dr. Burt Knack, revealed total occlusion of the first diagonal treated successfully with primary PCI using a long DES platform.  He was also noted to have severe multivessel CAD with severe LAD stenosis, severe OM and distal LCx stenosis, and moderately severe ulcerated plaque in the proximal RCA. There was moderate segmental LV systolic dysfunction c/w an acute lateral wall (diagonal) infarct. Decision was made to get a surgical consult regarding his residual LAD, OM, RCA and LCx disease. Patient was seen by Dr. Servando Snare who recommended CABG, however the patient refused surgery and opted for PCI. On 05/21/15, he underwent staged PCI + DES to LAD and proximal RCA. He tolerated the procedure well. He had no recurrent CP. He was placed on DAPT with ASA + Effient.  He was also placed on high dose statin therapy and BB therapy. 2D echo was obtained prior to discharge and revealed mild LV systolic dysfunction with an EF of 40-45%. Despite this, his volume status remained stable. Spironolactone was added. Post PCI, he had a bump  in his SCr to 1.79. He was treated with IV hydration and renal function improved. SCr day of discharge was 1.34. He had no other issues. He was last seen and examined by Dr. Acie Fredrickson who determined he was stable for discharge home. Post hospital f/u has been arranged for 05/01/15. He will be followed by Dr. Burt Knack.   Consults: CT surgery  Significant Diagnostic Studies:  [facility] 05/19/15 Conclusion     Prox LAD lesion, 90% stenosed.  Mid LAD lesion, 50% stenosed.  1st Mrg lesion, 80% stenosed.  Dist Cx lesion, 90% stenosed.  Prox RCA lesion, 75% stenosed.  There is moderate left ventricular systolic dysfunction.  1st Diag lesion, 100% stenosed. There is a 0% residual stenosis post intervention.  A drug-eluting stent was placed.  Final conclusions:  Total occlusion of the first diagonal treated successfully with primary PCI using a long DES platform  Severe multivessel CAD with severe LAD stenosis, severe OM and distal LCx stenosis, and moderately severe ulcerated plaque in the proximal RCA  Moderate segmental LV systolic dysfunction c/w an acute lateral wall (diagonal) infarct    LHC/Staged PCI 04/20/15 Conclusion     Prox RCA lesion, 75% stenosed. A drug-eluting stent was placed. There is a 0% residual stenosis post intervention.  Prox LAD lesion, 90% stenosed. A drug eluting stent was placed. There is a 0% residual stenosis post intervention.  A drug-eluting stent was placed.  Recommendations: Continue ASA, Brilinta, statin, beta blocker, Ace-inh. Will continue NTG drip post stent placement given mild chest pressure. EKG post PCI with no ST changes.    2D echo  04/19/15 Study Conclusions  - Left ventricle: The cavity size was normal. There was mild concentric hypertrophy. Systolic function was mildly to moderately reduced. The estimated ejection fraction was in the range of 40% to 45%. There is akinesis of the mid-apicalanterolateral and apical myocardium.  Doppler parameters are consistent with abnormal left ventricular relaxation (grade 1 diastolic dysfunction). - Pericardium, extracardiac: A trivial pericardial effusion was identified circumferential to the heart. There was no evidence of hemodynamic compromise.   Treatments: See Hospital Course  Discharge Exam: Blood pressure 120/64, pulse 70, temperature 97.8 F (36.6 C), temperature source Oral, resp. rate 18, height 5\' 8"  (1.727 m), weight 215 lb (97.523 kg), SpO2 96 %.   Disposition: 01-Home or Self Care      Discharge Instructions    Amb Referral to Cardiac Rehabilitation    Complete by:  As directed   Congestive Heart Failure: If diagnosis is Heart Failure, patient MUST meet each of the CMS criteria: 1. Left Ventricular Ejection Fraction </= 35% 2. NYHA class II-IV symptoms despite being on optimal heart failure therapy for at least 6 weeks. 3. Stable = have not had a recent (<6 weeks) or planned (<6 months) major cardiovascular hospitalization or procedure  Program Details: - Physician supervised classes - 1-3 classes per week over a 12-18 week period, generally for a total of 36 sessions  Physician Certification: I certify that the above Cardiac Rehabilitation treatment is medically necessary and is medically approved by me for treatment of this patient. The patient is willing and cooperative, able to ambulate and medically stable to participate in exercise rehabilitation. The participant's progress and Individualized Treatment Plan will be reviewed by the Medical Director, Cardiac Rehab staff and as indicated by the Referring/Ordering Physician.  Diagnosis:   Myocardial Infarction PCI       Diet - low sodium heart healthy    Complete by:  As directed      Increase activity slowly    Complete by:  As directed             Medication List    TAKE these medications        aspirin 81 MG tablet  Take 81 mg by mouth 3 (three) times a week.      atorvastatin 80 MG tablet  Commonly known as:  LIPITOR  Take 1 tablet (80 mg total) by mouth daily at 6 PM.     carvedilol 6.25 MG tablet  Commonly known as:  COREG  Take 1 tablet (6.25 mg total) by mouth 2 (two) times daily with a meal.     levothyroxine 50 MCG tablet  Commonly known as:  SYNTHROID, LEVOTHROID  Take 1 tablet (50 mcg total) by mouth daily before breakfast.  Start taking on:  04/25/2015     nitroGLYCERIN 0.4 MG SL tablet  Commonly known as:  NITROSTAT  Place 1 tablet (0.4 mg total) under the tongue every 5 (five) minutes as needed for chest pain.     prasugrel 10 MG Tabs tablet  Commonly known as:  EFFIENT  Take 1 tablet (10 mg total) by mouth daily.     prasugrel 10 MG Tabs tablet  Commonly known as:  EFFIENT  Take 1 tablet (10 mg total) by mouth daily.     Vitamin D-3 5000 UNITS Tabs  Take 5,000 Units by mouth daily.       Follow-up Information    Call [facility].   Why:  call the week of  September 5th to schedule hospital f/u with MD Doreene Burke if you wish to continue to see MD.    Contact information:   [address] [place] [address] [phone]      Follow up with Richardson Dopp, PA-C On 05/01/2015.   Specialties:  Physician Assistant, Radiology, Interventional Cardiology   Why:  9:00 am (Dr. Antionette Char PA)   Contact information:   [address] [place] [address] [phone]       TIME SPENT ON DISCHARGE, INCLUDING PHYSICIAN TIME: >30 MINUTES  Signed: Lyda Jester 04/24/2015, 1:22 PM  Attending Note:   The patient was seen and examined.  Agree with assessment and plan as noted above.  Changes made to the above note as needed.  Creatinine is stable  Ready for DC  Will need a case manager consult to help arrange his appts.  He needs an internal medicine doctor   Will need 2 scripts for Effient Follow up with Dr. Burt Knack /PA   Thayer Headings, Brooke Bonito., MD,  [facility] 04/24/2015, 5:42 PM [address],  [place] Pager [phone]

## 2015-04-24 NOTE — Telephone Encounter (Signed)
TCM per Brittaany-- 8/30 @ 9am w/ Nicki Reaper

## 2015-04-24 NOTE — Progress Notes (Signed)
Patient Profile: 65 y/o male who presented with anterolateral STEMI, had DES to D1. Given residual severe disease in LAD and RCA, CABG recommended. Patient refused CABG so he had DES to LAD and DES to RCA 8/19.   Subjective: No complaints. Denies any recurrent CP. No dyspnea. Ambulating w/o difficulty. Ready to go home.   Objective: Vital signs in last 24 hours: Temp:  [97.8 F (36.6 C)-98 F (36.7 C)] 97.8 F (36.6 C) (08/23 0500) Pulse Rate:  [70-75] 70 (08/23 0500) Resp:  [18] 18 (08/23 0500) BP: (115-138)/(69-83) 126/72 mmHg (08/23 0500) SpO2:  [96 %-97 %] 96 % (08/23 0500) Weight:  [215 lb (97.523 kg)] 215 lb (97.523 kg) (08/23 0500) Last BM Date: 04/22/15  Intake/Output from previous day: 08/22 0701 - 08/23 0700 In: 1102.5 [P.O.:350; I.V.:752.5] Out: 1125 [Urine:1125] Intake/Output this shift:    Medications Current Facility-Administered Medications  Medication Dose Route Frequency Provider Last Rate Last Dose  . 0.9 %  sodium chloride infusion  250 mL Intravenous PRN Sherren Mocha, MD      . 0.9 %  sodium chloride infusion  250 mL Intravenous PRN Burnell Blanks, MD      . 0.9 %  sodium chloride infusion   Intravenous Continuous Dorothy Spark, MD 75 mL/hr at 04/23/15 2308    . acetaminophen (TYLENOL) tablet 650 mg  650 mg Oral Q4H PRN Sherren Mocha, MD   650 mg at 04/23/15 0345  . aspirin EC tablet 81 mg  81 mg Oral Daily Sherren Mocha, MD   81 mg at 04/23/15 0815  . atorvastatin (LIPITOR) tablet 80 mg  80 mg Oral q1800 Sherren Mocha, MD   80 mg at 04/23/15 1751  . carvedilol (COREG) tablet 6.25 mg  6.25 mg Oral BID WC Sherren Mocha, MD   6.25 mg at 04/23/15 1751  . diazepam (VALIUM) tablet 5 mg  5 mg Oral Q8H PRN Sherren Mocha, MD      . HYDROcodone-acetaminophen (NORCO/VICODIN) 5-325 MG per tablet 1 tablet  1 tablet Oral QHS PRN Sherren Mocha, MD      . insulin aspart (novoLOG) injection 0-15 Units  0-15 Units Subcutaneous TID [facility] Sherren Mocha, MD   3 Units at 04/23/15 1754  . levothyroxine (SYNTHROID, LEVOTHROID) tablet 25 mcg  25 mcg Oral QAC breakfast Belva Crome, MD   25 mcg at 04/23/15 0810  . living well with diabetes book MISC   Does not apply Once Sherren Mocha, MD      . magnesium hydroxide (MILK OF MAGNESIA) suspension 30 mL  30 mL Oral Daily PRN Sherren Mocha, MD   30 mL at 04/21/15 0926  . nitroGLYCERIN (NITROSTAT) SL tablet 0.4 mg  0.4 mg Sublingual Q5 min PRN Serita Grit, MD   0.4 mg at 04/18/15 1153  . ondansetron (ZOFRAN) injection 4 mg  4 mg Intravenous Q6H PRN Sherren Mocha, MD      . prasugrel (EFFIENT) tablet 10 mg  10 mg Oral Daily Sherren Mocha, MD   10 mg at 04/23/15 0810  . sodium chloride 0.9 % injection 3 mL  3 mL Intravenous Q12H Sherren Mocha, MD   3 mL at 04/23/15 1000  . sodium chloride 0.9 % injection 3 mL  3 mL Intravenous PRN Sherren Mocha, MD      . sodium chloride 0.9 % injection 3 mL  3 mL Intravenous Q12H Burnell Blanks, MD   3 mL at 04/23/15 1000  . sodium chloride 0.9 %  injection 3 mL  3 mL Intravenous PRN Burnell Blanks, MD      . traZODone (DESYREL) tablet 100 mg  100 mg Oral QHS PRN Alphia Moh, MD   100 mg at 04/23/15 2307    PE: General appearance: alert, cooperative and no distress Neck: no carotid bruit and no JVD Lungs: clear to auscultation bilaterally Heart: regular rate and rhythm, S1, S2 normal, no murmur, click, rub or gallop Extremities: no LEE Pulses: 2+ and symmetric Skin: warm and dry Neurologic: Grossly normal  Lab Results:   Recent Labs  04/22/15 0318  WBC 8.8  HGB 10.5*  HCT 32.0*  PLT 143*   BMET  Recent Labs  04/22/15 0318  NA 135  K 3.9  CL 104  CO2 22  GLUCOSE 146*  BUN 33*  CREATININE 1.79*  CALCIUM 8.5*     Assessment/Plan   Active Problems:   Hypothyroidism   Essential hypertension   Acute MI, lateral wall, initial episode of care   ST elevation myocardial infarction (STEMI) of lateral wall   Type  II diabetes mellitus with complication   Acute systolic heart failure   Coronary artery disease involving native coronary artery of native heart with unstable angina pectoris   1. CAD: multivessel disease on recent cath. Patient refused CABG. S/p staged stenting.No further chest pain.  - Continue ASA 81 and Brilinta.  - Continue atorvastatin 80 daily - continue BB therapy with Coreg.   2. Ischemic cardiomyopathy: EF 40-45%.  No heart failure symptoms. He is on Coreg 6.25 mg bid. Continue to hold lisinopril for now with rise in creatinine. Restart as an outpatient if renal function is improved.   3. Acute on Chronic Kidney Disease: Suspect contrast nephropathy with rise in creatinine from 1.27 => 1.68-->1.79. He has been receiving IV hydration. F/u BMP pending. Continue to hold lisinopril. Will defer to MD timing of discharge. Will need f/u BMP in the office after discharge.   4. HLD: continue atorvastatin.   5. DM: continue Insulin  Dispo: if downward drift in Scr, plan for d/c home today and outpatient BMP at the end of the week.      LOS: 6 days    Brittainy M. Ladoris Gene 04/24/2015 7:46 AM  Attending Note:   The patient was seen and examined.  Agree with assessment and plan as noted above.  Changes made to the above note as needed.  Creatinine is stable  Ready for DC  Will need a case manager consult to help arrange his appts.  He needs an internal medicine doctor   Will need 2 scripts for Effient Follow up with Dr. Burt Knack /PA   Thayer Headings, Brooke Bonito., MD, [facility] 04/24/2015, 10:12 AM [address],  [place] Pager [phone]

## 2015-04-24 NOTE — Discharge Instructions (Signed)
Prasugrel oral tablets (take once daily with 81 mg of Aspirin) What is this medicine? PRASUGREL (PRA soo grel) helps to prevent blood clots. This medicine is used to prevent heart attack, stroke, or other vascular events in people who are at high risk. This medicine may be used for other purposes; ask your health care provider or pharmacist if you have questions. COMMON BRAND NAME(S): Effient What should I tell my health care provider before I take this medicine? They need to know if you have any of these conditions: -bleeding disorders -kidney disease -liver disease -recent trauma or surgery -stomach or intestinal ulcers -stroke or transient ischemic attack -an unusual or allergic reaction to prasugrel, other medicines, foods, dyes, or preservatives -pregnant or trying to get pregnant -breast-feeding How should I use this medicine? Take this medicine by mouth with a drink of water. Follow the directions on the prescription label. You may take this medicine with or without food. If it upsets your stomach, take it with food. This medicine may be chewed or it may be crushed and put into food or liquids such as applesauce, juice, or water as long as it is taken immediately. This medicine has a bitter taste that you may notice if it is chewed or crushed. Take your medicine at regular intervals. Do not take your medicine more often than directed. Do not stop taking except on your doctor's advice. Talk to your pediatrician regarding the use of this medicine in children. Special care may be needed. Overdosage: If you think you have taken too much of this medicine contact a poison control center or emergency room at once. NOTE: This medicine is only for you. Do not share this medicine with others. What if I miss a dose? If you miss a dose, take it as soon as you can. If it is almost time for your next dose, take only that dose. Do not take double or extra doses. What may interact with this  medicine? -aspirin -certain medicines that treat or prevent blood clots like warfarin, enoxaparin, and dalteparin -NSAIDS, medicines for pain and inflammation, like ibuprofen or naproxen This list may not describe all possible interactions. Give your health care provider a list of all the medicines, herbs, non-prescription drugs, or dietary supplements you use. Also tell them if you smoke, drink alcohol, or use illegal drugs. Some items may interact with your medicine. What should I watch for while using this medicine? Visit your doctor or health care professional for regular check ups. Do not stop taking your medicine unless your doctor tells you to. Notify your doctor or health care professional and seek emergency treatment if you develop breathing problems; changes in vision; chest pain; severe, sudden headache; pain, swelling, warmth in the leg; trouble speaking; sudden numbness or weakness of the face, arm, or leg. These can be signs that your condition has gotten worse. If you are going to have surgery or dental work, tell your doctor or health care professional that you are taking this medicine. What side effects may I notice from receiving this medicine? Side effects that you should report to your doctor or health care professional as soon as possible: -allergic reactions like skin rash, itching or hives, swelling of the face, lips, or tongue -signs and symptoms of bleeding such as bloody or black, tarry stools; red or dark-brown urine; spitting up blood or brown material that looks like coffee grounds; red spots on the skin; unusual bruising or bleeding from the eye, gums, or nose Side  effects that usually do not require medical attention (report to your doctor or health care professional if they continue or are bothersome): -diarrhea -headache -nausea, vomiting -pain in back, arms or legs This list may not describe all possible side effects. Call your doctor for medical advice about side  effects. You may report side effects to FDA at 1-800-FDA-1088. Where should I keep my medicine? Keep out of the reach of children. Store at room temperature between 15 and 30 degrees C (59 and 86 degrees F). Throw away any unused medicine after the expiration date. NOTE: This sheet is a summary. It may not cover all possible information. If you have questions about this medicine, talk to your doctor, pharmacist, or health care provider.  2015, Elsevier/Gold Standard. (2012-12-14 16:39:26)  Nitroglycerine Protocol for Angina (if you have recurrent chest pain similar to your heart attack) 1.Sit down and place 1 nitroglycerine tablet under your tongue and wait 3-5 minutes. If angina is unrelieved: 2.Place another nitroglycerine tablet under your tongue and wait 3-5 minutes. If angina is still unrelieved: 3.Take a third nitroglycerine tablet and CALL 911. DO NOT drive yourself. DO NOT have another person drive you to the hospital. You may be having a heart attack, and you should be in the care of Medics who will safely bring you to the hospital.  Copyright  VHI. All rights reserved.   No Driving until your follow-up cardiology appointment  on 05/01/15 No heavy lifting until your follow-up appointment

## 2015-04-24 NOTE — Telephone Encounter (Signed)
Met with the patient prior to discharge at the request of Juanetta Beets to discuss possible follow up at the [facility].  The patient was unsure of what he wanted to do and did not want to schedule an appointment at [facility] at this time. He has been to the clinic before and has the contact information.  Instructed him to call the clinic if he would like to schedule an appointment.   Update provided to [place], CM

## 2015-04-25 NOTE — Telephone Encounter (Signed)
Patient contacted regarding discharge from [facility] on 8/23.  Patient understands to follow up with provider Richardson Dopp, PA on Tuesday at 8/30 at [facility]. Patient understands discharge instructions? Yes Patient understands medications and regiment? Yes Patient understands to bring all medications to this visit? Yes  Patient denies complaints; was given directions to the office.  Patient denies questions or concerns.

## 2015-04-25 NOTE — Telephone Encounter (Signed)
Tried calling pt. He states he will have to call me back.

## 2015-04-30 NOTE — Progress Notes (Signed)
Cardiology Office Note   Date:  05/01/2015   ID:  Kelechi Orgeron, DOB 1951-04-19, MRN [id number]  PCP:  Andria Frames, MD  Cardiologist:  Dr. Sherren Mocha   Electrophysiologist:  n/a  Chief Complaint  Patient presents with  . Hospitalization Follow-up    s/p STEMI  . Coronary Artery Disease     History of Present Illness: Nicolas Ray is a 64 y.o. male with a hx of diabetes, HTN, non-adherence to medical therapy. He was admitted 8/17-8/23 with lateral STEMI.  Emergent LHC demonstrated an occluded D1 that was treated with a DES.  He was noted to have severe LAD, severe OM and distal LCx stenosis and moderately severe ulcerated plaque in the proximal RCA.  EF was 35-45%.  He was seen by [facility] (Dr. Servando Snare).  CABG was recommended.  However the patient refused.  He therefore underwent staged PCI on 8/19 with a DES to the mid LAD and DES to the proximal RCA.  Echo demonstrated EF 40-45%.  Cr did increase prior to DC and ACE inhibitor was placed on hold.    He returns for FU.  He is here with his wife today.  He has a lot of questions and I tried to answer all of them.  He denies any further chest pain.  Denies dyspnea, orthopnea, PND, edema.  No syncope.  Still smoking.  He was under the impression that we would be managing his diabetes and thyroid here.     Studies/Reports Reviewed Today:  LHC 04/20/15 LAD: Proximal 90%, mid 50%, D1 stent patent LCx: Distal 90%, OM1 80% RCA: Proximal 75% PCI:   1.  2.75 x 32 mm Promus Premier DES to the mid LAD 2.  4 x 28 mm Promus premier DES to the proximal RCA   Echo 04/19/15 Mild LVH, EF 40-45%, mid-apical anterolateral and apical AK, grade 1 diastolic dysfunction, trivial effusion   LHC 04/18/15 LM: Normal LAD: Proximal 90%, mid 50%, severe diffuse LAD stenosis beyond origin of D1 D1: Occluded with heavy thrombus LCx: Distal 90%, OM1 80% RCA: Proximal 75% EF 35-45% PCI: 3 x 38 mm Xience DES to D1 Final conclusions:  Total occlusion of the  first diagonal treated successfully with primary PCI using a long DES platform  Severe multivessel CAD with severe LAD stenosis, severe OM and distal LCx stenosis, and moderately severe ulcerated plaque in the proximal RCA  Moderate segmental LV systolic dysfunction c/w an acute lateral wall (diagonal) infarct Recommendations:  Need to consider revascularization options (multivessel stenting versus CABG). The patient appears to be noncompliant based on history and current interaction. Will review with colleagues and discuss options further with patient.     Past Medical History  Diagnosis Date  . HTN (hypertension) 02/08/2013  . Hypothyroidism   . Depression   . DM2 (diabetes mellitus, type 2)   . CAD (coronary artery disease) 04/18/2015    a. Lat STEMI 8/16 - LHC:  pLAD 90, mLAD 50, D1 100, dLCx 90, OM1 80, pRCA 75, EF 35-45% >> DES to D1;  b. staged PCI 04/20/15 - DES to mid LAD and DES to prox RCA  . Ischemic cardiomyopathy     a. Echo 8/16:  Mild LVH, EF 40-45%, mid-apical anterolateral and apical AK, grade 1 diastolic dysfunction, trivial effusion    Past Surgical History  Procedure Laterality Date  . No prior surgery    . Cardiac catheterization N/A 04/18/2015    Procedure: Left Heart Cath and Coronary Angiography;  Surgeon:  Sherren Mocha, MD;  Location: [facility];  Service: Cardiovascular;  Laterality: N/A;  . Cardiac catheterization N/A 04/18/2015    Procedure: Coronary Stent Intervention;  Surgeon: Sherren Mocha, MD;  Location: [facility];  Service: Cardiovascular;  Laterality: N/A;  . Cardiac catheterization N/A 04/20/2015    Procedure: Coronary Stent Intervention;  Surgeon: Burnell Blanks, MD;  Location: [facility];  Service: Cardiovascular;  Laterality: N/A;     Current Outpatient Prescriptions  Medication Sig Dispense Refill  . aspirin 81 MG tablet Take 81 mg by mouth daily.     Marland Kitchen atorvastatin (LIPITOR) 80 MG tablet Take 1 tablet (80 mg  total) by mouth daily at 6 PM. 30 tablet 5  . carvedilol (COREG) 6.25 MG tablet Take 1 tablet (6.25 mg total) by mouth 2 (two) times daily with a meal. 60 tablet 5  . Cholecalciferol (VITAMIN D-3) 5000 UNITS TABS Take 5,000 Units by mouth daily.     Marland Kitchen levothyroxine (SYNTHROID, LEVOTHROID) 50 MCG tablet Take 1 tablet (50 mcg total) by mouth daily before breakfast. 30 tablet 5  . nitroGLYCERIN (NITROSTAT) 0.4 MG SL tablet Place 1 tablet (0.4 mg total) under the tongue every 5 (five) minutes as needed for chest pain. 25 tablet 1  . prasugrel (EFFIENT) 10 MG TABS tablet Take 1 tablet (10 mg total) by mouth daily. 30 tablet 10   No current facility-administered medications for this visit.    Allergies:   Review of patient's allergies indicates no known allergies.    Social History:  The patient  reports that he has been smoking Cigarettes.  He has been smoking about 0.25 packs per day. He has never used smokeless tobacco. He reports that he does not drink alcohol or use illicit drugs.   Family History:  The patient's family history includes Cancer in his father; Heart disease in his mother. There is no history of Colon cancer.    ROS:   Please see the history of present illness.   Review of Systems  All other systems reviewed and are negative.     PHYSICAL EXAM: VS:  BP 122/86 mmHg  Pulse 75  Ht 5\' 8"  (1.727 m)  Wt 205 lb (92.987 kg)  BMI 31.18 kg/m2    Wt Readings from Last 3 Encounters:  05/01/15 205 lb (92.987 kg)  04/24/15 215 lb (97.523 kg)  04/18/14 204 lb (92.534 kg)     GEN: Well nourished, well developed, in no acute distress HEENT: normal Neck: no JVD,  no masses Cardiac:  Normal S1/S2, RRR; no murmur ,  no rubs or gallops, no edema ; right wrist without hematoma or mass  Respiratory:  clear to auscultation bilaterally, no wheezing, rhonchi or rales. GI: soft, nontender, nondistended, + BS MS: no deformity or atrophy Skin: warm and dry  Neuro:  CNs II-XII intact,  Strength and sensation are intact Psych: Normal affect   EKG:  EKG is ordered today.  It demonstrates:   NSR, HR 75, right axis, Q waves 1, aVL, V2-V4, TWI 1, aVL, V2-5, similar to prior tracings   Recent Labs: 04/18/2015: TSH 25.863* 04/19/2015: B Natriuretic Peptide 171.2* 04/22/2015: Hemoglobin 10.5*; Platelets 143* 05/01/2015: BUN 25*; Creatinine, Ser 1.28; Potassium 4.0; Sodium 134*    Lipid Panel    Component Value Date/Time   CHOL 173 04/19/2015 0245   TRIG 243* 04/19/2015 0245   HDL 28* 04/19/2015 0245   CHOLHDL 6.2 04/19/2015 0245   VLDL 49* 04/19/2015 0245  Sundance 96 04/19/2015 0245      ASSESSMENT AND PLAN:    Coronary Artery Disease:  S/p recent lateral STEMI tx with DES to the D1.  He had residual multivessel disease.  Patient refused CABG and underwent PCI with DES to the LAD and RCA.  EF is reduced at 40-45%.  He is doing well post DC without anginal symptoms.  We discussed the importance of dual antiplatelet therapy. Continue ASA, Effient, beta-blocker, statin.  I have encouraged him to start cardiac rehab.    Ischemic Cardiomyopathy:  His ACE inhibitor was held due to increasing creatinine.  Repeat BMET today.  If creatinine ok, resume Lisinopril 5 mg QD with close FU on renal function and K+.  Continue beta-blocker.  Consider FU Echo in 90 days.  Hypertension:  Controlled  Hyperlipidemia:  Continue statin.  Check Lipids and LFTs in 6 weeks.   Tobacco Abuse:  We discussed the importance of tobacco cessation.    Diabetes Mellitus:  A1c 8.5 in the hospital.  He has failed to FU with his PCP in the past.  I have asked him to FU and we discussed the importance of diabetic control in preventing further cardiovascular events.  Hypothyroidism:  He was off of his Synthroid at presentation. He is now back on this.  He should FU with his PCP.  Knee Pain:  He is c/o knee pain on the R.  FU with PCP.    Medication Changes: Current medicines are reviewed at length  with the patient today.  Concerns regarding medicines are as outlined above.  The following changes have been made:   Discontinued Medications   PRASUGREL (EFFIENT) 10 MG TABS TABLET    Take 1 tablet (10 mg total) by mouth daily.   Modified Medications   No medications on file   New Prescriptions   No medications on file    Labs/ tests ordered today include:   Orders Placed This Encounter  Procedures  . Basic Metabolic Panel (BMET)  . Lipid Profile  . Hepatic function panel  . EKG 12-Lead      Disposition:    FU with Dr. Sherren Mocha or me in 6-8 weeks.    Signed, Versie Starks, MHS 05/01/2015 3:38 PM    [facility] [place], [place], [place]  [address] Phone: [phone]; Fax: [phone]

## 2015-05-01 ENCOUNTER — Ambulatory Visit (INDEPENDENT_AMBULATORY_CARE_PROVIDER_SITE_OTHER): Payer: 59 | Admitting: Physician Assistant

## 2015-05-01 ENCOUNTER — Telehealth: Payer: Self-pay | Admitting: *Deleted

## 2015-05-01 ENCOUNTER — Encounter: Payer: Self-pay | Admitting: Physician Assistant

## 2015-05-01 VITALS — BP 122/86 | HR 75 | Ht 68.0 in | Wt 205.0 lb

## 2015-05-01 DIAGNOSIS — I255 Ischemic cardiomyopathy: Secondary | ICD-10-CM

## 2015-05-01 DIAGNOSIS — E038 Other specified hypothyroidism: Secondary | ICD-10-CM

## 2015-05-01 DIAGNOSIS — I10 Essential (primary) hypertension: Secondary | ICD-10-CM | POA: Diagnosis not present

## 2015-05-01 DIAGNOSIS — E785 Hyperlipidemia, unspecified: Secondary | ICD-10-CM | POA: Diagnosis not present

## 2015-05-01 DIAGNOSIS — I25110 Atherosclerotic heart disease of native coronary artery with unstable angina pectoris: Secondary | ICD-10-CM | POA: Diagnosis not present

## 2015-05-01 DIAGNOSIS — E118 Type 2 diabetes mellitus with unspecified complications: Secondary | ICD-10-CM

## 2015-05-01 DIAGNOSIS — Z720 Tobacco use: Secondary | ICD-10-CM

## 2015-05-01 LAB — BASIC METABOLIC PANEL
BUN: 25 mg/dL — AB (ref 6–23)
CHLORIDE: 102 meq/L (ref 96–112)
CO2: 24 meq/L (ref 19–32)
Calcium: 9.2 mg/dL (ref 8.4–10.5)
Creatinine, Ser: 1.28 mg/dL (ref 0.40–1.50)
GFR: 60.12 mL/min (ref >60.00)
Glucose, Bld: 145 mg/dL — ABNORMAL HIGH (ref 70–99)
POTASSIUM: 4 meq/L (ref 3.5–5.1)
Sodium: 134 mEq/L — ABNORMAL LOW (ref 135–145)

## 2015-05-01 NOTE — Patient Instructions (Addendum)
For your constipation you can try: Colace (Docusate) 100 mg once to twice daily as needed Or Miralax Or Metamucil  To help you with sleep, you can try: Benadryl 25 mg once daily at bedtime as needed Or Melatonin (take according to the package)   Medication Instructions:  Your physician recommends that you continue on your current medications as directed. Please refer to the Current Medication list given to you today.   Labwork: 1. TODAY BMET  2. FASTING LIPID AND LIVER PANEL TO BE DONE IN 6 WEEKS  Testing/Procedures: NONE  Follow-Up: 6-8 WEEKS WITH Dolton Shaker, PAC SAME DAY DR. Burt Knack IS IN THE OFFICE  Any Other Special Instructions Will Be Listed Below (If Applicable). SEE YOUR PRIMARY CARE PHYSICIAN IN 1-2 WEEKS FOR FOR A FOLLOW UP ON YOUR DIABETES, HYPOTHYROIDISM, AND KNEE PAIN

## 2015-05-01 NOTE — Telephone Encounter (Signed)
DPR for sister Risdon; lmptcb to go over results.

## 2015-05-02 ENCOUNTER — Other Ambulatory Visit: Payer: Self-pay | Admitting: *Deleted

## 2015-05-02 NOTE — Telephone Encounter (Signed)
Informed sister that Nicolas Ray said Creatinine and K+ ok, start Lisinopril 5mg  QD and BMET in one week. Sister states that they already have medication at home because PCP recently prescribed. Scheduled lab work for 05/10/15.

## 2015-05-02 NOTE — Telephone Encounter (Signed)
Left pt's sister DPR a message to call back.

## 2015-05-02 NOTE — Telephone Encounter (Signed)
F/u    Pt's sister returning a call from nurse concerning her brother's results.

## 2015-05-02 NOTE — Telephone Encounter (Signed)
Follow up     Pt sister returning call

## 2015-05-10 ENCOUNTER — Other Ambulatory Visit (INDEPENDENT_AMBULATORY_CARE_PROVIDER_SITE_OTHER): Payer: 59

## 2015-05-10 DIAGNOSIS — I10 Essential (primary) hypertension: Secondary | ICD-10-CM | POA: Diagnosis not present

## 2015-05-10 DIAGNOSIS — R7989 Other specified abnormal findings of blood chemistry: Secondary | ICD-10-CM

## 2015-05-10 LAB — BASIC METABOLIC PANEL
BUN: 42 mg/dL — AB (ref 6–23)
CO2: 21 mEq/L (ref 19–32)
CREATININE: 1.52 mg/dL — AB (ref 0.40–1.50)
Calcium: 9.5 mg/dL (ref 8.4–10.5)
Chloride: 103 mEq/L (ref 96–112)
GFR: 49.3 mL/min — AB (ref >60.00)
GLUCOSE: 158 mg/dL — AB (ref 70–99)
POTASSIUM: 4.4 meq/L (ref 3.5–5.1)
Sodium: 135 mEq/L (ref 135–145)

## 2015-05-11 NOTE — Addendum Note (Signed)
Addended byRoland Rack on: 05/11/2015 10:47 AM   Modules accepted: Orders

## 2015-05-11 NOTE — Addendum Note (Signed)
Addended by: Roland Rack on: 05/11/2015 10:34 AM   Modules accepted: Orders

## 2015-05-18 ENCOUNTER — Telehealth: Payer: Self-pay | Admitting: *Deleted

## 2015-05-18 ENCOUNTER — Other Ambulatory Visit (INDEPENDENT_AMBULATORY_CARE_PROVIDER_SITE_OTHER): Payer: 59 | Admitting: *Deleted

## 2015-05-18 DIAGNOSIS — R748 Abnormal levels of other serum enzymes: Secondary | ICD-10-CM

## 2015-05-18 DIAGNOSIS — R7989 Other specified abnormal findings of blood chemistry: Secondary | ICD-10-CM

## 2015-05-18 LAB — BASIC METABOLIC PANEL
BUN: 36 mg/dL — AB (ref 6–23)
CALCIUM: 9.6 mg/dL (ref 8.4–10.5)
CO2: 20 meq/L (ref 19–32)
Chloride: 106 mEq/L (ref 96–112)
Creatinine, Ser: 1.4 mg/dL (ref 0.40–1.50)
GFR: 54.21 mL/min — ABNORMAL LOW (ref >60.00)
GLUCOSE: 124 mg/dL — AB (ref 70–99)
Potassium: 4.1 mEq/L (ref 3.5–5.1)
SODIUM: 136 meq/L (ref 135–145)

## 2015-05-18 NOTE — Telephone Encounter (Signed)
Pt has been notified of lab reults by phone with verbal understanding

## 2015-05-28 ENCOUNTER — Ambulatory Visit (HOSPITAL_COMMUNITY)
Admission: RE | Admit: 2015-05-28 | Discharge: 2015-05-28 | Disposition: A | Discharge status: Home / Self Care | Payer: 59 | Source: Ambulatory Visit | Attending: Cardiovascular Disease | Admitting: Cardiovascular Disease

## 2015-05-28 DIAGNOSIS — N1330 Unspecified hydronephrosis: Secondary | ICD-10-CM | POA: Insufficient documentation

## 2015-05-28 DIAGNOSIS — R748 Abnormal levels of other serum enzymes: Secondary | ICD-10-CM | POA: Diagnosis not present

## 2015-05-28 DIAGNOSIS — I701 Atherosclerosis of renal artery: Secondary | ICD-10-CM | POA: Insufficient documentation

## 2015-05-28 DIAGNOSIS — E119 Type 2 diabetes mellitus without complications: Secondary | ICD-10-CM | POA: Insufficient documentation

## 2015-05-28 DIAGNOSIS — I10 Essential (primary) hypertension: Secondary | ICD-10-CM

## 2015-05-28 DIAGNOSIS — R7989 Other specified abnormal findings of blood chemistry: Secondary | ICD-10-CM

## 2015-05-30 ENCOUNTER — Encounter: Payer: Self-pay | Admitting: Physician Assistant

## 2015-05-30 ENCOUNTER — Telehealth: Payer: Self-pay | Admitting: *Deleted

## 2015-05-30 NOTE — Telephone Encounter (Signed)
Pt notified of Renal US results by phone with verbal understanding

## 2015-06-12 ENCOUNTER — Other Ambulatory Visit (INDEPENDENT_AMBULATORY_CARE_PROVIDER_SITE_OTHER): Payer: 59

## 2015-06-12 DIAGNOSIS — I10 Essential (primary) hypertension: Secondary | ICD-10-CM

## 2015-06-12 LAB — HEPATIC FUNCTION PANEL
ALBUMIN: 4 g/dL (ref 3.6–5.1)
ALK PHOS: 95 U/L (ref 40–115)
ALT: 54 U/L — ABNORMAL HIGH (ref 9–46)
AST: 28 U/L (ref 10–35)
BILIRUBIN INDIRECT: 0.7 mg/dL (ref 0.2–1.2)
BILIRUBIN TOTAL: 0.9 mg/dL (ref 0.2–1.2)
Bilirubin, Direct: 0.2 mg/dL (ref <=0.2)
Total Protein: 7.6 g/dL (ref 6.1–8.1)

## 2015-06-12 LAB — LIPID PANEL
Cholesterol: 86 mg/dL — ABNORMAL LOW (ref 125–200)
HDL: 29 mg/dL — ABNORMAL LOW (ref >=40)
LDL CALC: 41 mg/dL (ref <130)
Total CHOL/HDL Ratio: 3 Ratio (ref <=5.0)
Triglycerides: 82 mg/dL (ref <150)
VLDL: 16 mg/dL (ref <30)

## 2015-06-14 ENCOUNTER — Other Ambulatory Visit: Payer: Self-pay

## 2015-06-14 DIAGNOSIS — R748 Abnormal levels of other serum enzymes: Secondary | ICD-10-CM

## 2015-06-24 NOTE — Progress Notes (Signed)
Cardiology Office Note   Date:  06/25/2015   ID:  Nicolas Ray, DOB [birth year], MRN [id number]  PCP:  Andria Frames, MD  Cardiologist:  Dr. Sherren Mocha   Electrophysiologist:  n/a  Chief Complaint  Patient presents with  . Follow-up  . Coronary Artery Disease  . Cardiomyopathy     History of Present Illness: Nicolas Ray is a 64 y.o. male with a hx of diabetes, HTN, non-adherence to medical therapy. He was admitted 04/2015 with lateral STEMI.  Emergent LHC demonstrated an occluded D1 that was treated with a DES.  He was noted to have severe LAD, severe OM and distal LCx stenosis and moderately severe ulcerated plaque in the proximal RCA.  EF was 35-45%.  He was seen by TCTS (Dr. Servando Snare).  CABG was recommended.  However the patient refused.  He therefore underwent staged PCI on 8/19 with a DES to the mid LAD and DES to the proximal RCA.  Echo demonstrated EF 40-45%.  Cr did increase prior to DC and ACE inhibitor was placed on hold.  I saw him in FU 05/01/15. I placed him on Lisinopril.  However, his FU lab work demonstrated worsening Creatinine. I stopped his ACE inhibitor and got a Renal Arterial US that did not demonstrate significant renal artery stenosis.    He returns for FU.  Overall he is doing well.  The patient denies chest pain, shortness of breath, syncope, orthopnea, PND or significant pedal edema. He did have a lot of nausea that he attributed to his medications.  He started taking them all separately with food.  He has had no further nausea but is frustrated now that he is eating 4-5 times a day to just take medications. He continues to smoke.     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  2.75 x 32 mm Promus Premier DES to the mid LAD 2.  4 x 28 mm Promus premier DES to the proximal RCA   Echo 04/19/15 Mild LVH,  EF 40-45%, mid-apical anterolateral and apical AK, grade 1 diastolic dysfunction, trivial effusion   LHC 04/18/15 LM: Normal LAD: Proximal 90%, mid 50%, severe diffuse LAD stenosis beyond origin of D1 D1: Occluded with heavy thrombus LCx: Distal 90%, OM1 80% RCA: Proximal 75% EF 35-45% PCI: 3 x 38 mm Xience DES to D1 Final conclusions:  Total occlusion of D1 treated with primary PCI using a long DES platform  Severe multivessel CAD with severe LAD stenosis, severe OM and distal LCx stenosis, and moderately severe ulcerated plaque in the proximal RCA  Moderate segmental LV systolic dysfunction c/w an acute lateral wall (diagonal) infarct Recommendations:  Need to consider revascularization options (multivessel stenting versus CABG). The patient appears to be noncompliant based on history and current interaction. Will review with colleagues and discuss options further with patient.     Past Medical History  Diagnosis Date  . 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 Hypothyroidism   . Depression   . DM2 (diabetes mellitus, type 2) ([place])   . CAD (coronary artery disease) 04/18/2015    a. Lat STEMI 8/16 - LHC:  pLAD 90, mLAD 50, D1 100, dLCx 90, OM1 80, pRCA 75, EF 35-45% >> DES to D1;  b. staged PCI 04/20/15 -  DES to mid LAD and DES to prox RCA  . Ischemic cardiomyopathy     a. Echo 8/16:  Mild LVH, EF 40-45%, mid-apical anterolateral and apical AK, grade 1 diastolic dysfunction, trivial effusion    Past Surgical History  Procedure Laterality Date  . No prior surgery    . Cardiac catheterization N/A 04/18/2015    Procedure: Left Heart Cath and Coronary Angiography;  Surgeon: Sherren Mocha, MD;  Location: [facility];  Service: Cardiovascular;  Laterality: N/A;  . Cardiac catheterization N/A 04/18/2015    Procedure: Coronary Stent Intervention;  Surgeon: Sherren Mocha, MD;  Location: [facility];  Service: Cardiovascular;   Laterality: N/A;  . Cardiac catheterization N/A 04/20/2015    Procedure: Coronary Stent Intervention;  Surgeon: Burnell Blanks, MD;  Location: [facility];  Service: Cardiovascular;  Laterality: N/A;     Current Outpatient Prescriptions  Medication Sig Dispense Refill  . aspirin 81 MG tablet Take 81 mg by mouth daily.     Marland Kitchen atorvastatin (LIPITOR) 80 MG tablet Take 1 tablet (80 mg total) by mouth daily at 6 PM. 30 tablet 5  . carvedilol (COREG) 6.25 MG tablet Take 1 tablet (6.25 mg total) by mouth 2 (two) times daily with a meal. 60 tablet 5  . Cholecalciferol (VITAMIN D-3) 5000 UNITS TABS Take 5,000 Units by mouth daily.     Marland Kitchen levothyroxine (SYNTHROID, LEVOTHROID) 50 MCG tablet Take 1 tablet (50 mcg total) by mouth daily before breakfast. 30 tablet 5  . nitroGLYCERIN (NITROSTAT) 0.4 MG SL tablet Place 1 tablet (0.4 mg total) under the tongue every 5 (five) minutes as needed for chest pain. 25 tablet 1  . prasugrel (EFFIENT) 10 MG TABS tablet Take 1 tablet (10 mg total) by mouth daily. 30 tablet 10  . famotidine (PEPCID) 20 MG tablet Take 1 tablet (20 mg total) by mouth as directed. Twice daily for 1 month ; then take twice daily AS NEEDED 60 tablet 1  . metFORMIN (GLUCOPHAGE) 500 MG tablet TK 1 T PO BID  0   No current facility-administered medications for this visit.    Allergies:   Review of patient's allergies indicates no known allergies.    Social History:  The patient  reports that he has been smoking Cigarettes.  He has been smoking about 0.25 packs per day. He has never used smokeless tobacco. He reports that he does not drink alcohol or use illicit drugs.   Family History:  The patient's family history includes Cancer in his father; Heart disease in his mother. There is no history of Colon cancer.    ROS:   Please see the history of present illness.   Review of Systems  All other systems reviewed and are negative.    PHYSICAL EXAM: VS:  BP 142/78 mmHg   Pulse 68  Ht 5\' 8"  (1.727 m)  Wt 204 lb 6.4 oz (92.715 kg)  BMI 31.09 kg/m2    Wt Readings from Last 3 Encounters:  06/25/15 204 lb 6.4 oz (92.715 kg)  05/01/15 205 lb (92.987 kg)  04/24/15 215 lb (97.523 kg)     GEN: Well nourished, well developed, in no acute distress HEENT: normal Neck: no JVD,  no masses Cardiac:  Normal S1/S2, RRR; no murmur ,  no rubs or gallops, no edema Respiratory:  clear to auscultation bilaterally, no wheezing, rhonchi or rales. GI: soft, nontender, nondistended, + BS MS: no deformity or atrophy Skin: warm and dry  Neuro:  CNs II-XII intact, Strength and sensation are intact Psych: Normal affect   EKG:  EKG is ordered today.  It demonstrates:   NSR, HR 68, ant-lat Q waves with assoc TWI, QT 412 ms   Recent Labs: 04/18/2015: TSH 25.863* 04/19/2015: B Natriuretic Peptide 171.2* 04/22/2015: Hemoglobin 10.5*; Platelets 143* 05/18/2015: BUN 36*; Creatinine, Ser 1.40; Potassium 4.1; Sodium 136 06/12/2015: ALT 54*    Lipid Panel    Component Value Date/Time   CHOL 86* 06/12/2015 0956   TRIG 82 06/12/2015 0956   HDL 29* 06/12/2015 0956   CHOLHDL 3.0 06/12/2015 0956   VLDL 16 06/12/2015 0956   LDLCALC 41 06/12/2015 0956      ASSESSMENT AND PLAN:  1. Coronary Artery Disease:  S/p recent lateral STEMI tx with DES to the D1.  He had residual multivessel disease.  Patient refused CABG and underwent PCI with DES to the LAD and RCA.  EF is reduced at 40-45%.  He is doing well without angina.  Continue ASA, Effient, beta-blocker, statin.    2. Ischemic Cardiomyopathy:  Continue beta-blocker.  EF 40-45% at time of MI.  I had to stop his ACE inhibitor due to worsening Creatinine.  I planned to start Hydralazine today.  But, he has had recent issues with his stomach with nausea and vomiting. He feels better with taking his medications with food several times a day.  I will hold off on adding Hydralazine for now.    3. Hypertension:   He has not had any  medications yet today.  Continue current rx.  4. Hyperlipidemia:  Continue statin.  LDL in 10/16 was 41.     5. Tobacco Abuse:  He is still trying to quit. I stressed the importance of tobacco cessation with him today.    6. Diabetes Mellitus:  A1c 8.5 in the hospital.  FU with PCP.   7. Hypothyroidism:   FU with PCP.  8. CKD:  Stage 3.  As noted, ACE inhibitor was DC'd 2/2 worsening Creatinine.   9. Erectile Dysfunction:  He notes issue with erections.  He blames his medications. I stressed to him the importance of all of his medications. We discussed the impact of smoking on erectile dysfunction. He should FU with his PCP for management.  We discussed the importance of not taking NTG if he does start taking PDE-5 inhibitors.  10. Nausea:  Suspect some level of gastritis with all of his medications, especially Effient.  I have asked him to take Pepcid 20 mg bid x 1 month, then PRN.  I have asked him to resume a more normal schedule with his medications. If he has recurrent nausea and vomiting, he may need to see GI.      Medication Changes: Current medicines are reviewed at length with the patient today.  Concerns regarding medicines are as outlined above.  The following changes have been made:   Discontinued Medications   LISINOPRIL (PRINIVIL,ZESTRIL) 5 MG TABLET    Take 5 mg by mouth daily.   Modified Medications   No medications on file   New Prescriptions   FAMOTIDINE (PEPCID) 20 MG TABLET    Take 1 tablet (20 mg total) by mouth as directed. Twice daily for 1 month ; then take twice daily AS NEEDED   Labs/ tests ordered today include:   Orders Placed This Encounter  Procedures  . EKG 12-Lead     Disposition:    FU with Dr. Sherren Mocha 3 mos.  Signed, Versie Starks, MHS 06/25/2015 10:42 AM    [facility] [address], [place], [place]  [address] Phone: [phone]; Fax: [phone]

## 2015-06-25 ENCOUNTER — Ambulatory Visit (INDEPENDENT_AMBULATORY_CARE_PROVIDER_SITE_OTHER): Payer: 59 | Admitting: Physician Assistant

## 2015-06-25 ENCOUNTER — Encounter: Payer: Self-pay | Admitting: Physician Assistant

## 2015-06-25 VITALS — BP 142/78 | HR 68 | Ht 68.0 in | Wt 204.4 lb

## 2015-06-25 DIAGNOSIS — I255 Ischemic cardiomyopathy: Secondary | ICD-10-CM | POA: Diagnosis not present

## 2015-06-25 DIAGNOSIS — E118 Type 2 diabetes mellitus with unspecified complications: Secondary | ICD-10-CM

## 2015-06-25 DIAGNOSIS — E785 Hyperlipidemia, unspecified: Secondary | ICD-10-CM | POA: Diagnosis not present

## 2015-06-25 DIAGNOSIS — I25110 Atherosclerotic heart disease of native coronary artery with unstable angina pectoris: Secondary | ICD-10-CM | POA: Diagnosis not present

## 2015-06-25 DIAGNOSIS — N183 Chronic kidney disease, stage 3 (moderate): Secondary | ICD-10-CM

## 2015-06-25 DIAGNOSIS — I2510 Atherosclerotic heart disease of native coronary artery without angina pectoris: Secondary | ICD-10-CM

## 2015-06-25 DIAGNOSIS — R110 Nausea: Secondary | ICD-10-CM

## 2015-06-25 DIAGNOSIS — Z720 Tobacco use: Secondary | ICD-10-CM

## 2015-06-25 DIAGNOSIS — I10 Essential (primary) hypertension: Secondary | ICD-10-CM | POA: Diagnosis not present

## 2015-06-25 DIAGNOSIS — N529 Male erectile dysfunction, unspecified: Secondary | ICD-10-CM

## 2015-06-25 MED ORDER — FAMOTIDINE 20 MG PO TABS: 20.0000 mg | ORAL_TABLET | ORAL | Status: DC

## 2015-06-25 NOTE — Patient Instructions (Signed)
Medication Instructions:  1. START PEPCID 20 MG TWICE DAILY FOR 1 MONTH;THEN TAKE TWICE DAILY ONLY AS NEEDED  Labwork: NONE  Testing/Procedures: NONE  Follow-Up: 3 MONTHS WITH DR. Burt Knack; I WILL HAVE LAUREN DR. NURSE CALL YOU WITH AN APPT  Any Other Special Instructions Will Be Listed Below (If Applicable).

## 2015-06-25 NOTE — Addendum Note (Signed)
Addended by: Michae Kava on: 06/25/2015 10:47 AM   Modules accepted: Orders

## 2015-06-27 ENCOUNTER — Telehealth (HOSPITAL_COMMUNITY): Payer: Self-pay | Admitting: Cardiac Rehabilitation

## 2015-06-27 NOTE — Telephone Encounter (Signed)
PC to [facility] to verify benefits for cardiac rehab services.  Per [facility] representative:  $20 copay which is applied to OOP of $500.  Pt OOP max has been met for this calender year, therefore plans pays 100% until end of calender year.    No deductible.  No care notification necessary.  No visit limit.  Pt must demonstrate progress in program.  Reference# [id number].  Pt made aware of these benefits.  appt scheduled.  Verbal information given to pt. Written information of appt date, time and location  mailed to patient.

## 2015-07-12 ENCOUNTER — Encounter (HOSPITAL_COMMUNITY)
Admission: RE | Admit: 2015-07-12 | Discharge: 2015-07-12 | Disposition: A | Discharge status: Home / Self Care | Payer: 59 | Source: Ambulatory Visit | Attending: Cardiovascular Disease | Admitting: Cardiovascular Disease

## 2015-07-12 DIAGNOSIS — Z955 Presence of coronary angioplasty implant and graft: Secondary | ICD-10-CM | POA: Insufficient documentation

## 2015-07-12 DIAGNOSIS — I213 ST elevation (STEMI) myocardial infarction of unspecified site: Secondary | ICD-10-CM | POA: Insufficient documentation

## 2015-07-16 ENCOUNTER — Encounter (HOSPITAL_COMMUNITY): Payer: Self-pay

## 2015-07-16 ENCOUNTER — Encounter (HOSPITAL_COMMUNITY)
Admission: RE | Admit: 2015-07-16 | Discharge: 2015-07-16 | Disposition: A | Discharge status: Home / Self Care | Payer: 59 | Source: Ambulatory Visit | Attending: Cardiovascular Disease | Admitting: Cardiovascular Disease

## 2015-07-16 DIAGNOSIS — I213 ST elevation (STEMI) myocardial infarction of unspecified site: Secondary | ICD-10-CM | POA: Diagnosis not present

## 2015-07-16 DIAGNOSIS — Z955 Presence of coronary angioplasty implant and graft: Secondary | ICD-10-CM | POA: Diagnosis not present

## 2015-07-16 LAB — GLUCOSE, CAPILLARY
GLUCOSE-CAPILLARY: 131 mg/dL — AB (ref 65–99)
GLUCOSE-CAPILLARY: 83 mg/dL (ref 65–99)

## 2015-07-16 NOTE — Progress Notes (Addendum)
Pt started cardiac rehab today.  Pt tolerated light exercise without difficulty. VSS, telemetry-sinus rhythm, occasional PVC, asymptomatic.  Medication list reconciled.  Pt verbalized compliance with medications and denies barriers to compliance. PSYCHOSOCIAL ASSESSMENT:  PHQ-1.  Pt does admit to occasional depressive symptoms mostly related to his health.  Pt reports he feels guilt and remorse for his lifestyle choices that may have contributed to his coronary disease. However, overall Pt exhibits positive coping skills, hopeful outlook with supportive family. No psychosocial needs identified at this time, no psychosocial interventions necessary.    Pt enjoys going to movies and dates.  In the past pt admits he enjoyed traveling and "unmentionable" things.    Pt cardiac rehab  goal is  to become more independent and establish exercise routine with knowing his cardiac limitations.  Pt encouraged to participate in risk factor modification classes and home exercise in addition to cardiac rehab activities  to increase ability to achieve these goals.   Pt does continue to smoke 2-3 cigarettes per day and is not committed to stopping completely.  Pt congratulated on his efforts to cut back this much and encourage to continue to make the effort to quit.   Pt oriented to exercise equipment and routine.  Understanding verbalized.

## 2015-07-18 ENCOUNTER — Encounter (HOSPITAL_COMMUNITY)
Admission: RE | Admit: 2015-07-18 | Discharge: 2015-07-18 | Disposition: A | Discharge status: Home / Self Care | Payer: 59 | Source: Ambulatory Visit | Attending: Cardiovascular Disease | Admitting: Cardiovascular Disease

## 2015-07-18 DIAGNOSIS — I213 ST elevation (STEMI) myocardial infarction of unspecified site: Secondary | ICD-10-CM | POA: Diagnosis not present

## 2015-07-18 LAB — GLUCOSE, CAPILLARY
GLUCOSE-CAPILLARY: 143 mg/dL — AB (ref 65–99)
GLUCOSE-CAPILLARY: 118 mg/dL — AB (ref 65–99)

## 2015-07-18 NOTE — Progress Notes (Signed)
Pt completed Quality of Life survey as a participant in Cardiac Rehab. overall pt scores are satisfactory.  Pt lives with his sister and is satisfied with this arrangement. Pt admits he has regrets from past behaviors.  Pt admits to concern about medication induced erectile dysfunction however he is not currently in a relationship. Pt has discussed with his MD who has explained necessity of current medication regimen,pt is accepting and understanding of this.  Pt offered reassurance and emotional support. Will continue to monitor.

## 2015-07-20 ENCOUNTER — Encounter (HOSPITAL_COMMUNITY)
Admission: RE | Admit: 2015-07-20 | Discharge: 2015-07-20 | Disposition: A | Discharge status: Home / Self Care | Payer: 59 | Source: Ambulatory Visit | Attending: Cardiovascular Disease | Admitting: Cardiovascular Disease

## 2015-07-20 DIAGNOSIS — I213 ST elevation (STEMI) myocardial infarction of unspecified site: Secondary | ICD-10-CM | POA: Diagnosis not present

## 2015-07-21 LAB — GLUCOSE, CAPILLARY: GLUCOSE-CAPILLARY: 88 mg/dL (ref 65–99)

## 2015-07-23 ENCOUNTER — Encounter (HOSPITAL_COMMUNITY): Payer: 59

## 2015-07-25 ENCOUNTER — Encounter (HOSPITAL_COMMUNITY): Payer: 59

## 2015-07-30 ENCOUNTER — Encounter (HOSPITAL_COMMUNITY)
Admission: RE | Admit: 2015-07-30 | Discharge: 2015-07-30 | Disposition: A | Discharge status: Home / Self Care | Payer: 59 | Source: Ambulatory Visit | Attending: Cardiovascular Disease | Admitting: Cardiovascular Disease

## 2015-07-30 DIAGNOSIS — I213 ST elevation (STEMI) myocardial infarction of unspecified site: Secondary | ICD-10-CM | POA: Diagnosis not present

## 2015-07-30 LAB — GLUCOSE, CAPILLARY
Glucose-Capillary: 114 mg/dL — ABNORMAL HIGH (ref 65–99)
Glucose-Capillary: 130 mg/dL — ABNORMAL HIGH (ref 65–99)

## 2015-07-30 NOTE — Progress Notes (Signed)
Nutrition Note Spoke with pt. Pt with several questions re: DM. DM question re: A1c versus CBG answered. Pt also had a concerns whether or not he should take a calcium supplement. Per discussion, pt eats 2 servings of dairy/d and eats a variety of fruit and vegetables. Continue client-centered nutrition education by RD as part of interdisciplinary care.  Monitor and evaluate progress toward nutrition goal with team.  [name], M.Ed, RD, LDN, CDE 07/30/2015 4:13 PM

## 2015-08-01 ENCOUNTER — Encounter (HOSPITAL_COMMUNITY): Payer: 59

## 2015-08-03 ENCOUNTER — Encounter (HOSPITAL_COMMUNITY)
Admission: RE | Admit: 2015-08-03 | Discharge: 2015-08-03 | Disposition: A | Discharge status: Home / Self Care | Payer: 59 | Source: Ambulatory Visit | Attending: Cardiovascular Disease | Admitting: Cardiovascular Disease

## 2015-08-03 DIAGNOSIS — Z955 Presence of coronary angioplasty implant and graft: Secondary | ICD-10-CM | POA: Insufficient documentation

## 2015-08-03 DIAGNOSIS — I213 ST elevation (STEMI) myocardial infarction of unspecified site: Secondary | ICD-10-CM | POA: Diagnosis not present

## 2015-08-03 LAB — GLUCOSE, CAPILLARY
GLUCOSE-CAPILLARY: 112 mg/dL — AB (ref 65–99)
GLUCOSE-CAPILLARY: 85 mg/dL (ref 65–99)

## 2015-08-06 ENCOUNTER — Encounter (HOSPITAL_COMMUNITY)
Admission: RE | Admit: 2015-08-06 | Discharge: 2015-08-06 | Disposition: A | Discharge status: Home / Self Care | Payer: 59 | Source: Ambulatory Visit | Attending: Cardiovascular Disease | Admitting: Cardiovascular Disease

## 2015-08-06 DIAGNOSIS — I213 ST elevation (STEMI) myocardial infarction of unspecified site: Secondary | ICD-10-CM | POA: Diagnosis not present

## 2015-08-07 LAB — GLUCOSE, CAPILLARY
GLUCOSE-CAPILLARY: 183 mg/dL — AB (ref 65–99)
GLUCOSE-CAPILLARY: 117 mg/dL — AB (ref 65–99)

## 2015-08-08 ENCOUNTER — Encounter (HOSPITAL_COMMUNITY): Payer: 59

## 2015-08-10 ENCOUNTER — Encounter (HOSPITAL_COMMUNITY)
Admission: RE | Admit: 2015-08-10 | Discharge: 2015-08-10 | Disposition: A | Discharge status: Home / Self Care | Payer: 59 | Source: Ambulatory Visit | Attending: Cardiovascular Disease | Admitting: Cardiovascular Disease

## 2015-08-10 DIAGNOSIS — I213 ST elevation (STEMI) myocardial infarction of unspecified site: Secondary | ICD-10-CM | POA: Diagnosis not present

## 2015-08-10 LAB — GLUCOSE, CAPILLARY: Glucose-Capillary: 133 mg/dL — ABNORMAL HIGH (ref 65–99)

## 2015-08-13 ENCOUNTER — Encounter (HOSPITAL_COMMUNITY)
Admission: RE | Admit: 2015-08-13 | Discharge: 2015-08-13 | Disposition: A | Discharge status: Home / Self Care | Payer: 59 | Source: Ambulatory Visit | Attending: Cardiovascular Disease | Admitting: Cardiovascular Disease

## 2015-08-13 DIAGNOSIS — I213 ST elevation (STEMI) myocardial infarction of unspecified site: Secondary | ICD-10-CM | POA: Diagnosis not present

## 2015-08-13 LAB — GLUCOSE, CAPILLARY
GLUCOSE-CAPILLARY: 167 mg/dL — AB (ref 65–99)
Glucose-Capillary: 136 mg/dL — ABNORMAL HIGH (ref 65–99)

## 2015-08-15 ENCOUNTER — Encounter (HOSPITAL_COMMUNITY): Payer: 59

## 2015-08-17 ENCOUNTER — Encounter (HOSPITAL_COMMUNITY)
Admission: RE | Admit: 2015-08-17 | Discharge: 2015-08-17 | Disposition: A | Discharge status: Home / Self Care | Payer: 59 | Source: Ambulatory Visit | Attending: Cardiovascular Disease | Admitting: Cardiovascular Disease

## 2015-08-17 DIAGNOSIS — I213 ST elevation (STEMI) myocardial infarction of unspecified site: Secondary | ICD-10-CM | POA: Diagnosis not present

## 2015-08-17 LAB — GLUCOSE, CAPILLARY: Glucose-Capillary: 149 mg/dL — ABNORMAL HIGH (ref 65–99)

## 2015-08-20 ENCOUNTER — Encounter (HOSPITAL_COMMUNITY)
Admission: RE | Admit: 2015-08-20 | Discharge: 2015-08-20 | Disposition: A | Discharge status: Home / Self Care | Payer: 59 | Source: Ambulatory Visit | Attending: Cardiovascular Disease | Admitting: Cardiovascular Disease

## 2015-08-20 DIAGNOSIS — I213 ST elevation (STEMI) myocardial infarction of unspecified site: Secondary | ICD-10-CM | POA: Diagnosis not present

## 2015-08-20 LAB — GLUCOSE, CAPILLARY: Glucose-Capillary: 128 mg/dL — ABNORMAL HIGH (ref 65–99)

## 2015-08-20 NOTE — Progress Notes (Signed)
No psychosocial needs identified, no intervention necessary. Pt is verbalizing increased knowledge and understanding of diabetes self care.  Will continue to monitor.

## 2015-08-20 NOTE — Progress Notes (Signed)
Nicolas Ray 64 y.o. male Nutrition Note Spoke with pt. Nutrition Plan and Nutrition Survey goals reviewed with pt. Pt is following Step 1 of the Therapeutic Lifestyle Changes diet. Pt is diabetic. Pt denies diabetes diagnosis stating "I'm controlling it." This writer tried to reinforce SMBG as a way of controlling diabetes. Pt declines to check CBG's at home preferring to "not have to think about it." Last A1c indicates blood glucose poorly controlled. Diabetes Education test and A1c discussed. Pt expressed poor understanding despite multiple attempts to communicate information. Pt aware of nutrition education classes offered and is unable to attend nutrition classes. Lab Results  Component Value Date   HGBA1C 8.5* 04/18/2015   Nutrition Diagnosis ? Food-and nutrition-related knowledge deficit related to lack of exposure to information as related to diagnosis of: ? CVD ? DM ? Obesity related to excessive energy intake as evidenced by a BMI of 31.8  Nutrition RX/ Estimated Daily Nutrition Needs for: wt loss 1500-2000 Kcal, 40-55 gm fat, 10-13 gm sat fat, 1.5-2.0 gm trans-fat, <1500 mg sodium, 175-250 gm CHO   Nutrition Intervention ? Pt's individual nutrition plan reviewed with pt. ? Benefits of adopting Therapeutic Lifestyle Changes discussed when Medficts reviewed. ? Pt to attend the Portion Distortion class ? Pt to attend the Diabetes Q & A class ? Pt to attend the   ? Nutrition I class                  ? Nutrition II class     ? Diabetes Blitz class ? Pt given handouts for: ? Nutrition I class ? Nutrition II class ? Diabetes Blitz Class ? Continue client-centered nutrition education by RD, as part of interdisciplinary care. Goal(s) ? Pt to identify and limit food sources of saturated fat, trans fat, and cholesterol ? Pt to identify food quantities necessary to achieve: ? wt loss to a goal wt of 180-198 lb (81.8-90 kg) at graduation from cardiac rehab.  ? CBG concentrations in the normal  range or as close to normal as is safely possible. Monitor and Evaluate progress toward nutrition goal with team. Nutrition Risk: Change to Moderate [name], M.Ed, RD, LDN, CDE 08/20/2015 3:17 PM

## 2015-08-22 ENCOUNTER — Encounter (HOSPITAL_COMMUNITY): Payer: 59

## 2015-08-24 ENCOUNTER — Encounter (HOSPITAL_COMMUNITY)
Admission: RE | Admit: 2015-08-24 | Discharge: 2015-08-24 | Disposition: A | Discharge status: Home / Self Care | Payer: 59 | Source: Ambulatory Visit | Attending: Cardiovascular Disease | Admitting: Cardiovascular Disease

## 2015-08-24 DIAGNOSIS — I213 ST elevation (STEMI) myocardial infarction of unspecified site: Secondary | ICD-10-CM | POA: Diagnosis not present

## 2015-08-24 LAB — GLUCOSE, CAPILLARY: Glucose-Capillary: 150 mg/dL — ABNORMAL HIGH (ref 65–99)

## 2015-08-29 ENCOUNTER — Encounter (HOSPITAL_COMMUNITY): Payer: 59

## 2015-08-31 ENCOUNTER — Encounter (HOSPITAL_COMMUNITY)
Admission: RE | Admit: 2015-08-31 | Discharge: 2015-08-31 | Disposition: A | Discharge status: Home / Self Care | Payer: 59 | Source: Ambulatory Visit | Attending: Cardiovascular Disease | Admitting: Cardiovascular Disease

## 2015-08-31 DIAGNOSIS — I213 ST elevation (STEMI) myocardial infarction of unspecified site: Secondary | ICD-10-CM | POA: Diagnosis not present

## 2015-08-31 LAB — GLUCOSE, CAPILLARY: GLUCOSE-CAPILLARY: 120 mg/dL — AB (ref 65–99)

## 2015-09-05 ENCOUNTER — Encounter (HOSPITAL_COMMUNITY): Payer: BLUE CROSS/BLUE SHIELD

## 2015-09-07 ENCOUNTER — Encounter (HOSPITAL_COMMUNITY)
Admission: RE | Admit: 2015-09-07 | Discharge: 2015-09-07 | Disposition: A | Discharge status: Home / Self Care | Payer: BLUE CROSS/BLUE SHIELD | Source: Ambulatory Visit | Attending: Cardiovascular Disease | Admitting: Cardiovascular Disease

## 2015-09-07 DIAGNOSIS — I213 ST elevation (STEMI) myocardial infarction of unspecified site: Secondary | ICD-10-CM | POA: Insufficient documentation

## 2015-09-07 DIAGNOSIS — Z955 Presence of coronary angioplasty implant and graft: Secondary | ICD-10-CM | POA: Diagnosis not present

## 2015-09-10 ENCOUNTER — Encounter (HOSPITAL_COMMUNITY)
Admission: RE | Admit: 2015-09-10 | Discharge: 2015-09-10 | Disposition: A | Discharge status: Home / Self Care | Payer: BLUE CROSS/BLUE SHIELD | Source: Ambulatory Visit | Attending: Cardiovascular Disease | Admitting: Cardiovascular Disease

## 2015-09-10 DIAGNOSIS — I213 ST elevation (STEMI) myocardial infarction of unspecified site: Secondary | ICD-10-CM | POA: Diagnosis not present

## 2015-09-10 LAB — GLUCOSE, CAPILLARY
GLUCOSE-CAPILLARY: 108 mg/dL — AB (ref 65–99)
GLUCOSE-CAPILLARY: 101 mg/dL — AB (ref 65–99)
Glucose-Capillary: 275 mg/dL — ABNORMAL HIGH (ref 65–99)

## 2015-09-12 ENCOUNTER — Encounter (HOSPITAL_COMMUNITY): Payer: BLUE CROSS/BLUE SHIELD

## 2015-09-14 ENCOUNTER — Other Ambulatory Visit (INDEPENDENT_AMBULATORY_CARE_PROVIDER_SITE_OTHER): Payer: BLUE CROSS/BLUE SHIELD | Admitting: *Deleted

## 2015-09-14 ENCOUNTER — Encounter (HOSPITAL_COMMUNITY)
Admission: RE | Admit: 2015-09-14 | Discharge: 2015-09-14 | Disposition: A | Discharge status: Home / Self Care | Payer: BLUE CROSS/BLUE SHIELD | Source: Ambulatory Visit | Attending: Cardiovascular Disease | Admitting: Cardiovascular Disease

## 2015-09-14 DIAGNOSIS — R748 Abnormal levels of other serum enzymes: Secondary | ICD-10-CM

## 2015-09-14 DIAGNOSIS — I213 ST elevation (STEMI) myocardial infarction of unspecified site: Secondary | ICD-10-CM | POA: Diagnosis not present

## 2015-09-14 LAB — HEPATIC FUNCTION PANEL
ALT: 25 U/L (ref 9–46)
AST: 19 U/L (ref 10–35)
Albumin: 4 g/dL (ref 3.6–5.1)
Alkaline Phosphatase: 69 U/L (ref 40–115)
BILIRUBIN DIRECT: 0.2 mg/dL (ref <=0.2)
BILIRUBIN INDIRECT: 0.6 mg/dL (ref 0.2–1.2)
BILIRUBIN TOTAL: 0.8 mg/dL (ref 0.2–1.2)
Total Protein: 7 g/dL (ref 6.1–8.1)

## 2015-09-14 LAB — GLUCOSE, CAPILLARY
GLUCOSE-CAPILLARY: 148 mg/dL — AB (ref 65–99)
GLUCOSE-CAPILLARY: 99 mg/dL (ref 65–99)

## 2015-09-14 NOTE — Addendum Note (Signed)
Addended by: Eulis Foster on: 09/14/2015 08:15 AM   Modules accepted: Orders

## 2015-09-14 NOTE — Addendum Note (Signed)
Addended by: Eulis Foster on: 09/14/2015 07:54 AM   Modules accepted: Orders

## 2015-09-17 ENCOUNTER — Encounter (HOSPITAL_COMMUNITY)
Admission: RE | Admit: 2015-09-17 | Discharge: 2015-09-17 | Disposition: A | Discharge status: Home / Self Care | Payer: BLUE CROSS/BLUE SHIELD | Source: Ambulatory Visit | Attending: Cardiovascular Disease | Admitting: Cardiovascular Disease

## 2015-09-17 DIAGNOSIS — I213 ST elevation (STEMI) myocardial infarction of unspecified site: Secondary | ICD-10-CM | POA: Diagnosis not present

## 2015-09-17 LAB — GLUCOSE, CAPILLARY: GLUCOSE-CAPILLARY: 193 mg/dL — AB (ref 65–99)

## 2015-09-17 NOTE — Progress Notes (Signed)
No psychosocial needs identified, no intervention necessary. Pt is exercising on his own at home.  Will continue to monitor.

## 2015-09-18 ENCOUNTER — Telehealth: Payer: Self-pay | Admitting: *Deleted

## 2015-09-18 NOTE — Telephone Encounter (Signed)
Ptcb and has been notified of lab results by phone. See phone note from a few minutes ago where I s/w his sister Vittori [employer] investment banker).

## 2015-09-18 NOTE — Telephone Encounter (Signed)
DPR on file for sister Harp who has been notified of lab results today for pt with verbal understanding by phone.

## 2015-09-19 ENCOUNTER — Encounter (HOSPITAL_COMMUNITY): Payer: BLUE CROSS/BLUE SHIELD

## 2015-09-21 ENCOUNTER — Encounter (HOSPITAL_COMMUNITY)
Admission: RE | Admit: 2015-09-21 | Discharge: 2015-09-21 | Disposition: A | Discharge status: Home / Self Care | Payer: BLUE CROSS/BLUE SHIELD | Source: Ambulatory Visit | Attending: Cardiovascular Disease | Admitting: Cardiovascular Disease

## 2015-09-21 DIAGNOSIS — I213 ST elevation (STEMI) myocardial infarction of unspecified site: Secondary | ICD-10-CM | POA: Diagnosis not present

## 2015-09-21 LAB — GLUCOSE, CAPILLARY: GLUCOSE-CAPILLARY: 222 mg/dL — AB (ref 65–99)

## 2015-09-24 ENCOUNTER — Encounter (HOSPITAL_COMMUNITY)
Admission: RE | Admit: 2015-09-24 | Discharge: 2015-09-24 | Disposition: A | Discharge status: Home / Self Care | Payer: BLUE CROSS/BLUE SHIELD | Source: Ambulatory Visit | Attending: Cardiovascular Disease | Admitting: Cardiovascular Disease

## 2015-09-24 DIAGNOSIS — I213 ST elevation (STEMI) myocardial infarction of unspecified site: Secondary | ICD-10-CM | POA: Diagnosis not present

## 2015-09-24 LAB — GLUCOSE, CAPILLARY
GLUCOSE-CAPILLARY: 128 mg/dL — AB (ref 65–99)
GLUCOSE-CAPILLARY: 121 mg/dL — AB (ref 65–99)

## 2015-09-26 ENCOUNTER — Encounter (HOSPITAL_COMMUNITY): Payer: BLUE CROSS/BLUE SHIELD

## 2015-09-28 ENCOUNTER — Encounter (HOSPITAL_COMMUNITY)
Admission: RE | Admit: 2015-09-28 | Discharge: 2015-09-28 | Disposition: A | Discharge status: Home / Self Care | Payer: BLUE CROSS/BLUE SHIELD | Source: Ambulatory Visit | Attending: Cardiovascular Disease | Admitting: Cardiovascular Disease

## 2015-09-28 DIAGNOSIS — I213 ST elevation (STEMI) myocardial infarction of unspecified site: Secondary | ICD-10-CM | POA: Diagnosis not present

## 2015-09-28 LAB — GLUCOSE, CAPILLARY: Glucose-Capillary: 121 mg/dL — ABNORMAL HIGH (ref 65–99)

## 2015-10-01 ENCOUNTER — Encounter (HOSPITAL_COMMUNITY)
Admission: RE | Admit: 2015-10-01 | Discharge: 2015-10-01 | Disposition: A | Discharge status: Home / Self Care | Payer: BLUE CROSS/BLUE SHIELD | Source: Ambulatory Visit | Attending: Cardiovascular Disease | Admitting: Cardiovascular Disease

## 2015-10-01 DIAGNOSIS — I213 ST elevation (STEMI) myocardial infarction of unspecified site: Secondary | ICD-10-CM | POA: Diagnosis not present

## 2015-10-01 LAB — GLUCOSE, CAPILLARY: Glucose-Capillary: 124 mg/dL — ABNORMAL HIGH (ref 65–99)

## 2015-10-03 ENCOUNTER — Encounter (HOSPITAL_COMMUNITY): Payer: 59

## 2015-10-05 ENCOUNTER — Encounter: Payer: Self-pay | Admitting: Cardiovascular Disease

## 2015-10-05 ENCOUNTER — Encounter (HOSPITAL_COMMUNITY)
Admission: RE | Admit: 2015-10-05 | Discharge: 2015-10-05 | Disposition: A | Discharge status: Home / Self Care | Payer: 59 | Source: Ambulatory Visit | Attending: Cardiovascular Disease | Admitting: Cardiovascular Disease

## 2015-10-05 ENCOUNTER — Ambulatory Visit (INDEPENDENT_AMBULATORY_CARE_PROVIDER_SITE_OTHER): Payer: BLUE CROSS/BLUE SHIELD | Admitting: Cardiovascular Disease

## 2015-10-05 VITALS — BP 124/70 | HR 70 | Ht 67.5 in | Wt 202.0 lb

## 2015-10-05 DIAGNOSIS — E039 Hypothyroidism, unspecified: Secondary | ICD-10-CM | POA: Diagnosis not present

## 2015-10-05 DIAGNOSIS — I213 ST elevation (STEMI) myocardial infarction of unspecified site: Secondary | ICD-10-CM | POA: Diagnosis not present

## 2015-10-05 DIAGNOSIS — I5022 Chronic systolic (congestive) heart failure: Secondary | ICD-10-CM | POA: Diagnosis not present

## 2015-10-05 DIAGNOSIS — Z955 Presence of coronary angioplasty implant and graft: Secondary | ICD-10-CM | POA: Insufficient documentation

## 2015-10-05 DIAGNOSIS — E785 Hyperlipidemia, unspecified: Secondary | ICD-10-CM | POA: Diagnosis not present

## 2015-10-05 LAB — GLUCOSE, CAPILLARY
GLUCOSE-CAPILLARY: 136 mg/dL — AB (ref 65–99)
Glucose-Capillary: 180 mg/dL — ABNORMAL HIGH (ref 65–99)

## 2015-10-05 MED ORDER — ATORVASTATIN CALCIUM 20 MG PO TABS: 20.0000 mg | ORAL_TABLET | Freq: Every day | ORAL | Status: DC

## 2015-10-05 MED ORDER — LEVOTHYROXINE SODIUM 88 MCG PO TABS: 88.0000 ug | ORAL_TABLET | Freq: Every day | ORAL | Status: DC

## 2015-10-05 NOTE — Patient Instructions (Addendum)
Medication Instructions:  Your physician has recommended you make the following change in your medication:  1. DECREASE Atorvastatin to 20mg  take one tablet by mouth every evening 2. We will give one time refill on Levothyroxine 88 mcg daily --future refills should come from PCP  Labwork: Your physician recommends that you return for a FASTING LIPID and CMP in 3 MONTHS--nothing to eat or drink after midnight, lab opens at 7:30 AM  Testing/Procedures: Your physician has requested that you have an echocardiogram in 3 MONTHS. Echocardiography is a painless test that uses sound waves to create images of your heart. It provides your doctor with information about the size and shape of your heart and how well your heart's chambers and valves are working. This procedure takes approximately one hour. There are no restrictions for this procedure.  Follow-Up: Your physician recommends that you schedule a follow-up appointment in: 3 MONTHS with Richardson Dopp PA-C  Any Other Special Instructions Will Be Listed Below (If Applicable).     If you need a refill on your cardiac medications before your next appointment, please call your pharmacy.

## 2015-10-06 NOTE — Progress Notes (Signed)
Cardiology Office Note Date:  10/06/2015   ID:  Nicolas Ray, DOB Oct 17, 1950, MRN [id number]  PCP:  Pcp Not In System  Cardiologist:  Sherren Mocha, MD    Chief Complaint  Patient presents with  . Fatigue    History of Present Illness: Nicolas Ray is a 65 y.o. male who presents for follow-up of multivessel CAD. He was admitted 04/2015 with lateral STEMI. Emergent LHC demonstrated an occluded D1 that was treated with a DES. He was noted to have severe LAD, severe OM and distal LCx stenosis and moderately severe ulcerated plaque in the proximal RCA. EF was 35-45%. He was seen by TCTS (Dr. Servando Snare). CABG was recommended. However the patient refused. He therefore underwent staged PCI on 8/19 with a DES to the mid LAD and DES to the proximal RCA. Echo demonstrated EF 40-45%.  The patient is doing well. He is participating in cardiac rehab. Denies CP or dyspnea. Taking medications as instructed, with the exception of atorvastatin. This is 'making him sick.' He feels weak all over when he takes it in the evening.    Past Medical History  Diagnosis Date  . 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 Hypothyroidism   . Depression   . DM2 (diabetes mellitus, type 2) ([place])   . CAD (coronary artery disease) 04/18/2015    a. Lat STEMI 8/16 - LHC:  pLAD 90, mLAD 50, D1 100, dLCx 90, OM1 80, pRCA 75, EF 35-45% >> DES to D1;  b. staged PCI 04/20/15 - DES to mid LAD and DES to prox RCA  . Ischemic cardiomyopathy     a. Echo 8/16:  Mild LVH, EF 40-45%, mid-apical anterolateral and apical AK, grade 1 diastolic dysfunction, trivial effusion    Past Surgical History  Procedure Laterality Date  . No prior surgery    . Cardiac catheterization N/A 04/18/2015    Procedure: Left Heart Cath and Coronary Angiography;  Surgeon: Sherren Mocha, MD;  Location: [facility];  Service: Cardiovascular;  Laterality: N/A;  . Cardiac catheterization N/A 04/18/2015   Procedure: Coronary Stent Intervention;  Surgeon: Sherren Mocha, MD;  Location: [facility];  Service: Cardiovascular;  Laterality: N/A;  . Cardiac catheterization N/A 04/20/2015    Procedure: Coronary Stent Intervention;  Surgeon: Burnell Blanks, MD;  Location: [facility];  Service: Cardiovascular;  Laterality: N/A;    Current Outpatient Prescriptions  Medication Sig Dispense Refill  . aspirin 81 MG tablet Take 81 mg by mouth daily.     Marland Kitchen atorvastatin (LIPITOR) 20 MG tablet Take 1 tablet (20 mg total) by mouth daily at 6 PM. 30 tablet 6  . carvedilol (COREG) 6.25 MG tablet Take 1 tablet (6.25 mg total) by mouth 2 (two) times daily with a meal. 60 tablet 5  . metFORMIN (GLUCOPHAGE) 500 MG tablet TK 1 T PO BID  0  . prasugrel (EFFIENT) 10 MG TABS tablet Take 1 tablet (10 mg total) by mouth daily. 30 tablet 10  . levothyroxine (SYNTHROID, LEVOTHROID) 88 MCG tablet Take 1 tablet (88 mcg total) by mouth daily before breakfast. 30 tablet 1   No current facility-administered medications for this visit.    Allergies:   Review of patient's allergies indicates no known allergies.   Social History:  The patient  reports that he has been smoking Cigarettes.  He has been smoking about 0.25 packs per day. He has never used smokeless tobacco.  He reports that he does not drink alcohol or use illicit drugs.   Family History:  The patient's  family history includes Cancer in his father; Heart disease in his mother. There is no history of Colon cancer.    ROS:  Please see the history of present illness.  All other systems are reviewed and negative.    PHYSICAL EXAM: VS:  BP 124/70 mmHg  Pulse 70  Ht 5' 7.5" (1.715 m)  Wt 91.627 kg (202 lb)  BMI 31.15 kg/m2  SpO2 96% , BMI Body mass index is 31.15 kg/(m^2). GEN: Well nourished, well developed, in no acute distress HEENT: normal Neck: no JVD, no masses. No carotid bruits Cardiac: RRR without murmur or gallop                  Respiratory:  clear to auscultation bilaterally, normal work of breathing GI: soft, nontender, nondistended, + BS MS: no deformity or atrophy Ext: no pretibial edema, pedal pulses 2+= bilaterally Skin: warm and dry, no rash Neuro:  Strength and sensation are intact Psych: euthymic mood, full affect  EKG:  EKG is not ordered today.  Recent Labs: 04/18/2015: TSH 25.863* 04/19/2015: B Natriuretic Peptide 171.2* 04/22/2015: Hemoglobin 10.5*; Platelets 143* 05/18/2015: BUN 36*; Creatinine, Ser 1.40; Potassium 4.1; Sodium 136 09/14/2015: ALT 25   Lipid Panel     Component Value Date/Time   CHOL 86* 06/12/2015 0956   TRIG 82 06/12/2015 0956   HDL 29* 06/12/2015 0956   CHOLHDL 3.0 06/12/2015 0956   VLDL 16 06/12/2015 0956   LDLCALC 41 06/12/2015 0956      Wt Readings from Last 3 Encounters:  10/05/15 91.627 kg (202 lb)  07/12/15 92.7 kg (204 lb 5.9 oz)  06/25/15 92.715 kg (204 lb 6.4 oz)     ASSESSMENT AND PLAN: 1.  CAD, native vessel - old MI: no angina. Tolerating medical therapy reasonably well with the exception of his statin drug. Cannot take ACE because of worsening creatinine in past with this. Continue with cardiac rehab.   2. Hyperlipidemia: intolerant to atorvastatin 80 mg. Lipids very low. Reduce dose to 20 mg, rechallenge, and check lipids/LFT's 3 months.  3. Ischemic cardiomyopathy: NYHA I. Continue same Rx. Repeat echo 3 months prior to return visit.   4. Type II DM: still in denial about diabetes. I told him again today that he clearly has diabetes and tried to explain the disease process.   5. Hypothyroid: he stopped synthroid 3 weeks ago when he ran out of medicine. I refilled today and asked him to FU with PCP.  Current medicines are reviewed with the patient today.  The patient does not have concerns regarding medicines.  Labs/ tests ordered today include:   Orders Placed This Encounter  Procedures  . Lipid panel  . Comp Met (CMET)  . Echocardiogram     Disposition:   FU 3 months with Richardson Dopp, I will see him back in 6 months.  Deatra James, MD  10/06/2015 6:38 AM    [facility], [place], [place]  [address] Phone: [phone]; Fax: [phone]

## 2015-10-08 ENCOUNTER — Encounter (HOSPITAL_COMMUNITY)
Admission: RE | Admit: 2015-10-08 | Discharge: 2015-10-08 | Disposition: A | Discharge status: Home / Self Care | Payer: 59 | Source: Ambulatory Visit | Attending: Cardiovascular Disease | Admitting: Cardiovascular Disease

## 2015-10-08 DIAGNOSIS — I213 ST elevation (STEMI) myocardial infarction of unspecified site: Secondary | ICD-10-CM | POA: Diagnosis not present

## 2015-10-08 LAB — GLUCOSE, CAPILLARY: Glucose-Capillary: 104 mg/dL — ABNORMAL HIGH (ref 65–99)

## 2015-10-10 ENCOUNTER — Encounter (HOSPITAL_COMMUNITY): Payer: 59

## 2015-10-12 ENCOUNTER — Encounter (HOSPITAL_COMMUNITY)
Admission: RE | Admit: 2015-10-12 | Discharge: 2015-10-12 | Disposition: A | Discharge status: Home / Self Care | Payer: 59 | Source: Ambulatory Visit | Attending: Cardiovascular Disease | Admitting: Cardiovascular Disease

## 2015-10-12 DIAGNOSIS — I213 ST elevation (STEMI) myocardial infarction of unspecified site: Secondary | ICD-10-CM | POA: Diagnosis not present

## 2015-10-15 ENCOUNTER — Encounter (HOSPITAL_COMMUNITY)
Admission: RE | Admit: 2015-10-15 | Discharge: 2015-10-15 | Disposition: A | Discharge status: Home / Self Care | Payer: 59 | Source: Ambulatory Visit | Attending: Cardiovascular Disease | Admitting: Cardiovascular Disease

## 2015-10-15 DIAGNOSIS — I213 ST elevation (STEMI) myocardial infarction of unspecified site: Secondary | ICD-10-CM | POA: Diagnosis not present

## 2015-10-15 LAB — GLUCOSE, CAPILLARY
Glucose-Capillary: 144 mg/dL — ABNORMAL HIGH (ref 65–99)
Glucose-Capillary: 107 mg/dL — ABNORMAL HIGH (ref 65–99)
Glucose-Capillary: 104 mg/dL — ABNORMAL HIGH (ref 65–99)

## 2015-10-17 ENCOUNTER — Encounter (HOSPITAL_COMMUNITY): Payer: 59

## 2015-10-19 ENCOUNTER — Other Ambulatory Visit: Payer: Self-pay | Admitting: Cardiology

## 2015-10-19 ENCOUNTER — Encounter (HOSPITAL_COMMUNITY)
Admission: RE | Admit: 2015-10-19 | Discharge: 2015-10-19 | Disposition: A | Discharge status: Home / Self Care | Payer: 59 | Source: Ambulatory Visit | Attending: Cardiovascular Disease | Admitting: Cardiovascular Disease

## 2015-10-19 DIAGNOSIS — I213 ST elevation (STEMI) myocardial infarction of unspecified site: Secondary | ICD-10-CM | POA: Diagnosis not present

## 2015-10-19 LAB — GLUCOSE, CAPILLARY: GLUCOSE-CAPILLARY: 132 mg/dL — AB (ref 65–99)

## 2015-10-19 NOTE — Progress Notes (Signed)
90 day psychosocial reassessment   No psychosocial needs identified, no intervention necessary. Pt is exercising on his own at home, walking. Pt is gaining increased diabetes self care knowledge, however pt has not been compliant with attending cardiac risk factor education classes since he does not attend cardiac rehab on Wednesdays.  Hand outs given.  Will continue to monitor.

## 2015-10-22 ENCOUNTER — Encounter (HOSPITAL_COMMUNITY)
Admission: RE | Admit: 2015-10-22 | Discharge: 2015-10-22 | Disposition: A | Discharge status: Home / Self Care | Payer: 59 | Source: Ambulatory Visit | Attending: Cardiovascular Disease | Admitting: Cardiovascular Disease

## 2015-10-22 DIAGNOSIS — I213 ST elevation (STEMI) myocardial infarction of unspecified site: Secondary | ICD-10-CM | POA: Diagnosis not present

## 2015-10-22 LAB — GLUCOSE, CAPILLARY: Glucose-Capillary: 146 mg/dL — ABNORMAL HIGH (ref 65–99)

## 2015-10-24 ENCOUNTER — Encounter (HOSPITAL_COMMUNITY): Payer: 59

## 2015-10-26 ENCOUNTER — Encounter (HOSPITAL_COMMUNITY)
Admission: RE | Admit: 2015-10-26 | Discharge: 2015-10-26 | Disposition: A | Discharge status: Home / Self Care | Payer: 59 | Source: Ambulatory Visit | Attending: Cardiovascular Disease | Admitting: Cardiovascular Disease

## 2015-10-26 DIAGNOSIS — I213 ST elevation (STEMI) myocardial infarction of unspecified site: Secondary | ICD-10-CM | POA: Diagnosis not present

## 2015-10-26 LAB — GLUCOSE, CAPILLARY: Glucose-Capillary: 182 mg/dL — ABNORMAL HIGH (ref 65–99)

## 2015-10-29 ENCOUNTER — Encounter (HOSPITAL_COMMUNITY)
Admission: RE | Admit: 2015-10-29 | Discharge: 2015-10-29 | Disposition: A | Discharge status: Home / Self Care | Payer: 59 | Source: Ambulatory Visit | Attending: Cardiovascular Disease | Admitting: Cardiovascular Disease

## 2015-10-29 DIAGNOSIS — I213 ST elevation (STEMI) myocardial infarction of unspecified site: Secondary | ICD-10-CM | POA: Diagnosis not present

## 2015-10-29 LAB — GLUCOSE, CAPILLARY: GLUCOSE-CAPILLARY: 162 mg/dL — AB (ref 65–99)

## 2015-10-31 ENCOUNTER — Encounter (HOSPITAL_COMMUNITY): Payer: BLUE CROSS/BLUE SHIELD

## 2015-11-02 ENCOUNTER — Encounter (HOSPITAL_COMMUNITY)
Admission: RE | Admit: 2015-11-02 | Discharge: 2015-11-02 | Disposition: A | Discharge status: Home / Self Care | Payer: BLUE CROSS/BLUE SHIELD | Source: Ambulatory Visit | Attending: Cardiovascular Disease | Admitting: Cardiovascular Disease

## 2015-11-02 DIAGNOSIS — I213 ST elevation (STEMI) myocardial infarction of unspecified site: Secondary | ICD-10-CM | POA: Diagnosis not present

## 2015-11-02 DIAGNOSIS — Z955 Presence of coronary angioplasty implant and graft: Secondary | ICD-10-CM | POA: Diagnosis not present

## 2015-11-02 LAB — GLUCOSE, CAPILLARY: Glucose-Capillary: 189 mg/dL — ABNORMAL HIGH (ref 65–99)

## 2015-11-03 ENCOUNTER — Other Ambulatory Visit: Payer: Self-pay | Admitting: Cardiology

## 2015-11-05 ENCOUNTER — Encounter (HOSPITAL_COMMUNITY)
Admission: RE | Admit: 2015-11-05 | Discharge: 2015-11-05 | Disposition: A | Discharge status: Home / Self Care | Payer: BLUE CROSS/BLUE SHIELD | Source: Ambulatory Visit | Attending: Cardiovascular Disease | Admitting: Cardiovascular Disease

## 2015-11-05 DIAGNOSIS — I213 ST elevation (STEMI) myocardial infarction of unspecified site: Secondary | ICD-10-CM | POA: Diagnosis not present

## 2015-11-05 LAB — GLUCOSE, CAPILLARY: Glucose-Capillary: 195 mg/dL — ABNORMAL HIGH (ref 65–99)

## 2015-11-07 ENCOUNTER — Encounter (HOSPITAL_COMMUNITY): Payer: BLUE CROSS/BLUE SHIELD

## 2015-11-08 ENCOUNTER — Emergency Department (HOSPITAL_COMMUNITY)
Admission: EM | Admit: 2015-11-08 | Discharge: 2015-11-09 | Disposition: A | Discharge status: Home / Self Care | Payer: BLUE CROSS/BLUE SHIELD | Source: Home / Self Care | Attending: Emergency Medicine | Admitting: Emergency Medicine

## 2015-11-08 ENCOUNTER — Encounter (HOSPITAL_COMMUNITY): Payer: Self-pay

## 2015-11-08 ENCOUNTER — Emergency Department (HOSPITAL_COMMUNITY)
Admission: EM | Admit: 2015-11-08 | Discharge: 2015-11-08 | Disposition: A | Discharge status: Home / Self Care | Payer: BLUE CROSS/BLUE SHIELD | Attending: Emergency Medicine | Admitting: Emergency Medicine

## 2015-11-08 ENCOUNTER — Encounter (HOSPITAL_COMMUNITY): Payer: Self-pay | Admitting: Emergency Medicine

## 2015-11-08 ENCOUNTER — Emergency Department (HOSPITAL_COMMUNITY): Payer: BLUE CROSS/BLUE SHIELD

## 2015-11-08 DIAGNOSIS — Z79899 Other long term (current) drug therapy: Secondary | ICD-10-CM | POA: Insufficient documentation

## 2015-11-08 DIAGNOSIS — F17210 Nicotine dependence, cigarettes, uncomplicated: Secondary | ICD-10-CM | POA: Diagnosis not present

## 2015-11-08 DIAGNOSIS — Z960 Presence of urogenital implants: Secondary | ICD-10-CM

## 2015-11-08 DIAGNOSIS — I10 Essential (primary) hypertension: Secondary | ICD-10-CM | POA: Insufficient documentation

## 2015-11-08 DIAGNOSIS — E039 Hypothyroidism, unspecified: Secondary | ICD-10-CM

## 2015-11-08 DIAGNOSIS — R300 Dysuria: Secondary | ICD-10-CM | POA: Insufficient documentation

## 2015-11-08 DIAGNOSIS — Z7984 Long term (current) use of oral hypoglycemic drugs: Secondary | ICD-10-CM | POA: Insufficient documentation

## 2015-11-08 DIAGNOSIS — Z8659 Personal history of other mental and behavioral disorders: Secondary | ICD-10-CM | POA: Insufficient documentation

## 2015-11-08 DIAGNOSIS — Z7982 Long term (current) use of aspirin: Secondary | ICD-10-CM

## 2015-11-08 DIAGNOSIS — I2510 Atherosclerotic heart disease of native coronary artery without angina pectoris: Secondary | ICD-10-CM

## 2015-11-08 DIAGNOSIS — N1330 Unspecified hydronephrosis: Secondary | ICD-10-CM | POA: Diagnosis not present

## 2015-11-08 DIAGNOSIS — E119 Type 2 diabetes mellitus without complications: Secondary | ICD-10-CM | POA: Insufficient documentation

## 2015-11-08 DIAGNOSIS — Z7902 Long term (current) use of antithrombotics/antiplatelets: Secondary | ICD-10-CM | POA: Insufficient documentation

## 2015-11-08 DIAGNOSIS — K5900 Constipation, unspecified: Secondary | ICD-10-CM | POA: Diagnosis not present

## 2015-11-08 DIAGNOSIS — R109 Unspecified abdominal pain: Secondary | ICD-10-CM

## 2015-11-08 DIAGNOSIS — Z978 Presence of other specified devices: Secondary | ICD-10-CM

## 2015-11-08 DIAGNOSIS — Z9889 Other specified postprocedural states: Secondary | ICD-10-CM

## 2015-11-08 LAB — URINE MICROSCOPIC-ADD ON

## 2015-11-08 LAB — URINALYSIS, ROUTINE W REFLEX MICROSCOPIC
BILIRUBIN URINE: NEGATIVE
Bilirubin Urine: NEGATIVE
GLUCOSE, UA: 100 mg/dL — AB
Glucose, UA: NEGATIVE mg/dL
Ketones, ur: NEGATIVE mg/dL
Ketones, ur: NEGATIVE mg/dL
NITRITE: NEGATIVE
Nitrite: NEGATIVE
PH: 5 (ref 5.0–8.0)
Protein, ur: 30 mg/dL — AB
Protein, ur: 100 mg/dL — AB
SPECIFIC GRAVITY, URINE: 1.012 (ref 1.005–1.030)
SPECIFIC GRAVITY, URINE: 1.018 (ref 1.005–1.030)
pH: 5 (ref 5.0–8.0)

## 2015-11-08 LAB — CBC
HCT: 37.8 % — ABNORMAL LOW (ref 39.0–52.0)
Hemoglobin: 12.4 g/dL — ABNORMAL LOW (ref 13.0–17.0)
MCH: 22.1 pg — ABNORMAL LOW (ref 26.0–34.0)
MCHC: 32.8 g/dL (ref 30.0–36.0)
MCV: 67.3 fL — AB (ref 78.0–100.0)
PLATELETS: 184 10*3/uL (ref 150–400)
RBC: 5.62 MIL/uL (ref 4.22–5.81)
RDW: 15 % (ref 11.5–15.5)
WBC: 9.1 10*3/uL (ref 4.0–10.5)

## 2015-11-08 LAB — BASIC METABOLIC PANEL
ANION GAP: 10 (ref 5–15)
BUN: 29 mg/dL — ABNORMAL HIGH (ref 6–20)
CO2: 21 mmol/L — ABNORMAL LOW (ref 22–32)
Calcium: 10 mg/dL (ref 8.9–10.3)
Chloride: 105 mmol/L (ref 101–111)
Creatinine, Ser: 1.74 mg/dL — ABNORMAL HIGH (ref 0.61–1.24)
GFR calc non Af Amer: 40 mL/min — ABNORMAL LOW (ref >60)
GFR, EST AFRICAN AMERICAN: 46 mL/min — AB (ref >60)
GLUCOSE: 172 mg/dL — AB (ref 65–99)
POTASSIUM: 4.3 mmol/L (ref 3.5–5.1)
Sodium: 136 mmol/L (ref 135–145)

## 2015-11-08 MED ORDER — OXYCODONE-ACETAMINOPHEN 5-325 MG PO TABS
ORAL_TABLET | ORAL | Status: DC
Start: 2015-11-08 — End: 2015-11-08
  Filled 2015-11-08: qty 1

## 2015-11-08 MED ORDER — HYDROMORPHONE HCL 1 MG/ML IJ SOLN
1.0000 mg | Freq: Once | INTRAMUSCULAR | Status: AC
  Administered 2015-11-08: 1 mg via INTRAVENOUS
  Filled 2015-11-08: qty 1

## 2015-11-08 MED ORDER — KETOROLAC TROMETHAMINE 30 MG/ML IJ SOLN
30.0000 mg | Freq: Once | INTRAMUSCULAR | Status: AC
  Administered 2015-11-08: 30 mg via INTRAVENOUS
  Filled 2015-11-08: qty 1

## 2015-11-08 MED ORDER — KETOROLAC TROMETHAMINE 60 MG/2ML IM SOLN: 30.0000 mg | Freq: Once | INTRAMUSCULAR | Status: DC

## 2015-11-08 MED ORDER — OXYCODONE-ACETAMINOPHEN 5-325 MG PO TABS
1.0000 | ORAL_TABLET | Freq: Once | ORAL | Status: AC
  Administered 2015-11-08: 1 via ORAL

## 2015-11-08 MED ORDER — OXYCODONE-ACETAMINOPHEN 5-325 MG PO TABS
1.0000 | ORAL_TABLET | Freq: Once | ORAL | Status: AC
  Administered 2015-11-08: 1 via ORAL
  Filled 2015-11-08: qty 1

## 2015-11-08 NOTE — Discharge Instructions (Signed)
Please go straight to Dr. Simone Curia office (address and phone number listed in follow up).

## 2015-11-08 NOTE — ED Notes (Signed)
Pt bladder scanned prior to inserting foley catheter, bladder scan revealed >999 cc of urine in bladder. Pt given urinal as he stated he felt the need to urinate. Pt only able to successfully urinate 175 cc of urine.

## 2015-11-08 NOTE — ED Notes (Addendum)
Pt. reports worsening right flank pain onset last night , denies injury or fall, no hematuria or dysuria . No fever or emesis . Hypertensive at arrival.

## 2015-11-08 NOTE — ED Provider Notes (Signed)
CSN: [id number]     Arrival date & time 11/08/15  0534 History   First MD Initiated Contact with Patient 11/08/15 [phone]     Chief Complaint  Patient presents with  . Flank Pain     (Consider location/radiation/quality/duration/timing/severity/associated sxs/prior Treatment) Patient is a 65 y.o. male presenting with flank pain. The history is provided by the patient and medical records. No language interpreter was used.  Flank Pain Associated symptoms include nausea. Pertinent negatives include no abdominal pain, chills, congestion, coughing, fever, headaches, neck pain, sore throat, vomiting or weakness.  Nicolas Ray is a 65 y.o. male  with a PMH of CAD, HTN, hypothyroidism, DM2 who presents to the Emergency Department complaining of worsening intermittent right flank pain which began last night. No alleviating or aggravating factors noted. Associated symptoms include nausea, mild constipation. Patient denies vomiting, fever, chest pain, shortness of breath, dysuria. Denies recent injury or increased activity. No medication taken at home for symptoms. Patient did receive Percocet at triage - had time of my initial evaluation, patient states pain has completely resolved after taking pain medication. No history of similar signs or symptoms.  Past Medical History  Diagnosis Date  . 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 Hypothyroidism   . Depression   . DM2 (diabetes mellitus, type 2) ([place])   . CAD (coronary artery disease) 04/18/2015    a. Lat STEMI 8/16 - LHC:  pLAD 90, mLAD 50, D1 100, dLCx 90, OM1 80, pRCA 75, EF 35-45% >> DES to D1;  b. staged PCI 04/20/15 - DES to mid LAD and DES to prox RCA  . Ischemic cardiomyopathy     a. Echo 8/16:  Mild LVH, EF 40-45%, mid-apical anterolateral and apical AK, grade 1 diastolic dysfunction, trivial effusion   Past Surgical History  Procedure Laterality Date  . No prior surgery    . Cardiac catheterization  N/A 04/18/2015    Procedure: Left Heart Cath and Coronary Angiography;  Surgeon: Sherren Mocha, MD;  Location: [place] [facility];  Service: Cardiovascular;  Laterality: N/A;  . Cardiac catheterization N/A 04/18/2015    Procedure: Coronary Stent Intervention;  Surgeon: Sherren Mocha, MD;  Location: [place] [facility];  Service: Cardiovascular;  Laterality: N/A;  . Cardiac catheterization N/A 04/20/2015    Procedure: Coronary Stent Intervention;  Surgeon: Burnell Blanks, MD;  Location: [place] [facility];  Service: Cardiovascular;  Laterality: N/A;  . Coronary stent placement     Family History  Problem Relation Age of Onset  . Heart disease Mother   . Cancer Father   . Colon cancer Neg Hx    Social History  Substance Use Topics  . Smoking status: Light Tobacco Smoker -- 0.25 packs/day    Types: Cigarettes  . Smokeless tobacco: Never Used  . Alcohol Use: Yes    Review of Systems  Constitutional: Negative for fever and chills.  HENT: Negative for congestion and sore throat.   Eyes: Negative for visual disturbance.  Respiratory: Negative for cough, shortness of breath and wheezing.   Cardiovascular: Negative.   Gastrointestinal: Positive for nausea and constipation. Negative for vomiting, abdominal pain and diarrhea.  Genitourinary: Positive for flank pain. Negative for dysuria and discharge.  Musculoskeletal: Negative for neck pain and neck stiffness.  Skin: Negative for wound.  Neurological: Negative for dizziness, weakness and headaches.      Allergies  Review of patient's allergies indicates no known allergies.  Home  Medications   Prior to Admission medications   Medication Sig Start Date End Date Taking? Authorizing Provider  co-enzyme Q-10 30 MG capsule Take 30 mg by mouth daily.   Yes Historical Provider, MD  levothyroxine (SYNTHROID, LEVOTHROID) 88 MCG tablet Take 1 tablet (88 mcg total) by mouth daily before breakfast. 10/05/15  Yes Sherren Mocha, MD   metFORMIN (GLUCOPHAGE) 1000 MG tablet Take 1,000 mg by mouth 2 (two) times daily. 09/20/15  Yes Historical Provider, MD  aspirin 81 MG tablet Take 81 mg by mouth daily.     Historical Provider, MD  atorvastatin (LIPITOR) 20 MG tablet Take 1 tablet (20 mg total) by mouth daily at 6 PM. 10/05/15   Sherren Mocha, MD  carvedilol (COREG) 6.25 MG tablet TAKE 1 TABLET BY MOUTH TWICE DAILY WITH A MEAL 11/06/15   Sherren Mocha, MD  prasugrel (EFFIENT) 10 MG TABS tablet Take 1 tablet (10 mg total) by mouth daily. 04/24/15   Brittainy M Simmons, PA-C   BP 166/95 mmHg  Pulse 76  Temp(Src) 97.5 F (36.4 C) (Oral)  Resp 18  Ht 5\' 8"  (1.727 m)  Wt 92.534 kg  BMI 31.03 kg/m2  SpO2 96% Physical Exam  Constitutional: He is oriented to person, place, and time. He appears well-developed and well-nourished.  Alert and in no acute distress  HENT:  Head: Normocephalic and atraumatic.  Cardiovascular: Normal rate, regular rhythm, normal heart sounds and intact distal pulses.  Exam reveals no gallop and no friction rub.   No murmur heard. Pulmonary/Chest: Effort normal and breath sounds normal. No respiratory distress. He has no wheezes. He has no rales. He exhibits no tenderness.  Abdominal: Soft. Bowel sounds are normal. He exhibits no distension and no mass. There is no tenderness. There is no rebound and no guarding.  Musculoskeletal:  No tenderness to palpation of right back or flank. No overlying skin changes. No CVA tenderness.  Neurological: He is alert and oriented to person, place, and time.  Skin: Skin is warm and dry. No rash noted.  Psychiatric: He has a normal mood and affect. His behavior is normal. Judgment and thought content normal.  Nursing note and vitals reviewed.   ED Course  Procedures (including critical care time) Labs Review Labs Reviewed  URINALYSIS, ROUTINE W REFLEX MICROSCOPIC (NOT AT [facility] - Abnormal; Notable for the following:    Hgb urine dipstick SMALL (*)    Protein, ur  30 (*)    Leukocytes, UA SMALL (*)    All other components within normal limits  CBC - Abnormal; Notable for the following:    Hemoglobin 12.4 (*)    HCT 37.8 (*)    MCV 67.3 (*)    MCH 22.1 (*)    All other components within normal limits  BASIC METABOLIC PANEL - Abnormal; Notable for the following:    CO2 21 (*)    Glucose, Bld 172 (*)    BUN 29 (*)    Creatinine, Ser 1.74 (*)    GFR calc non Af Amer 40 (*)    GFR calc Af Amer 46 (*)    All other components within normal limits  URINE MICROSCOPIC-ADD ON - Abnormal; Notable for the following:    Squamous Epithelial / LPF 0-5 (*)    Bacteria, UA RARE (*)    All other components within normal limits  URINE CULTURE    Imaging Review Ct Renal Stone Study  11/08/2015  CLINICAL DATA:  Right-sided flank pain for 2 days EXAM:  CT ABDOMEN AND PELVIS WITHOUT CONTRAST TECHNIQUE: Multidetector CT imaging of the abdomen and pelvis was performed following the standard protocol without IV contrast. COMPARISON:  None. FINDINGS: Lung bases are well aerated with minimal scarring in the lingula. The liver, gallbladder, spleen, adrenal glands and pancreas are within normal limits. Kidneys are well visualized bilaterally and demonstrate bilateral cystic change. Additionally there is significant hydronephrosis and hydroureter identified bilaterally. This extends to the level of the bladder without evidence of focal calculus. The left ureter inserts somewhat laterally on the bladder. The right ureter appears to insert posteriorly. The bladder is well distended. The prostate is mildly enlarged. No pelvic mass lesion or sidewall abnormality is noted. The appendix is within normal limits. Mild aortoiliac calcifications are seen. No osseous abnormality is noted. IMPRESSION: Bilateral severe hydronephrosis and hydroureter without evidence of obstructing lesion. The hydronephrosis may be related to the insertions of the ureters distally. No renal calculi are identified.  No other focal abnormality is seen. Electronically Signed   By: Inez Catalina M.D.   On: 11/08/2015 08:43   I have personally reviewed and evaluated these images and lab results as part of my medical decision-making.   EKG Interpretation None      MDM   Final diagnoses:  Right flank pain  Hydronephrosis, unspecified hydronephrosis type   Nicolas Ray presents with right flank pain since last night. Patient had Percocet at triage and on my initial evaluation, states pain has completely resolved. 9:46 AM - Patient states pain has returned and was re-evaluated. Physical exam unchanged - no reproducible pain, however patient does appear uncomfortable and in pain. Given 1mg  dilaudid.   Labs: CBC and BMP at baseline; UA with small hgb, small leuks, 6-30 WBCs - urine sent for culture.  Imaging: CT shows bilateral severe hydronephrosis and hydroureter without evidence of obstructing lesions.   Consults: Urology, Dr, Karsten Ro, who recommends Toradol for additional pain relief. Then send him to Dr. Simone Curia office today for further evaluation.  Therapeutics: Pain control while in ED.   Patient seen by and discussed with Dr. Lita Mains who agrees with treatment plan.    [place], PA-C 11/08/15 [phone]  Addendum: Shortly after signing chart, Dr. Karsten Ro called back, recommending catheter to be placed while in ED. Approx. 1100 cc out of foley. Patient notes significant improvement. Discussed follow up care with urology. All questions answered.   [facility] Orel Hord, PA-C 11/08/15 1136  Julianne Rice, MD 11/08/15 [phone]

## 2015-11-08 NOTE — ED Notes (Signed)
Pt here with foley catheter that was placed this morning during his ED visit and states he has noticed blood in his urine and in the leg bag and states it is burning. He was seen here earlier today for hydronephrosis and told to follow up with Dr. Karsten Ro but reports "they wouldn't take me and told me I had to wait until Monday."

## 2015-11-08 NOTE — ED Notes (Signed)
Offered ort a wheelchair, pt refused. Pt ambulated to treatment room with ease.

## 2015-11-08 NOTE — ED Notes (Signed)
Urine sample taken from pts leg bag

## 2015-11-09 ENCOUNTER — Encounter (HOSPITAL_COMMUNITY): Payer: BLUE CROSS/BLUE SHIELD

## 2015-11-09 LAB — URINE CULTURE: Culture: 1000

## 2015-11-09 MED ORDER — CEPHALEXIN 250 MG PO CAPS
500.0000 mg | ORAL_CAPSULE | Freq: Once | ORAL | Status: AC
  Administered 2015-11-09: 500 mg via ORAL
  Filled 2015-11-09: qty 2

## 2015-11-09 MED ORDER — CEPHALEXIN 500 MG PO CAPS: 500.0000 mg | ORAL_CAPSULE | Freq: Two times a day (BID) | ORAL | Status: DC

## 2015-11-09 NOTE — ED Provider Notes (Signed)
CSN: [id number]     Arrival date & time 11/08/15  2016 History   First MD Initiated Contact with Patient 11/09/15 0000     Chief Complaint  Patient presents with  . Hematuria     (Consider location/radiation/quality/duration/timing/severity/associated sxs/prior Treatment) HPI  65 year old male presents with dysuria and hematuria. Earlier in the day he had a Foley catheter placed for bilateral hydronephrosis with distended bladder. This was seen on a CT scan. No obvious ureteral stones. Patient states that his flank pain went away when a Foley was placed and over 1 L was taken out. When to the urologist office was told he cannot be seen until Monday, 3/13. Patient then noticed some blood in his Foley bag. Since discharge he has felt dysuria/burning at the tip of his penis whenever he tries to urinate. No abdominal pain. No nausea, vomiting, back pain, flank pain, or fevers.  Past Medical History  Diagnosis Date  . 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 Hypothyroidism   . Depression   . DM2 (diabetes mellitus, type 2) ([place])   . CAD (coronary artery disease) 04/18/2015    a. Lat STEMI 8/16 - LHC:  pLAD 90, mLAD 50, D1 100, dLCx 90, OM1 80, pRCA 75, EF 35-45% >> DES to D1;  b. staged PCI 04/20/15 - DES to mid LAD and DES to prox RCA  . Ischemic cardiomyopathy     a. Echo 8/16:  Mild LVH, EF 40-45%, mid-apical anterolateral and apical AK, grade 1 diastolic dysfunction, trivial effusion   Past Surgical History  Procedure Laterality Date  . No prior surgery    . Cardiac catheterization N/A 04/18/2015    Procedure: Left Heart Cath and Coronary Angiography;  Surgeon: Sherren Mocha, MD;  Location: [place] [facility];  Service: Cardiovascular;  Laterality: N/A;  . Cardiac catheterization N/A 04/18/2015    Procedure: Coronary Stent Intervention;  Surgeon: Sherren Mocha, MD;  Location: [place] [facility];  Service: Cardiovascular;  Laterality: N/A;  .  Cardiac catheterization N/A 04/20/2015    Procedure: Coronary Stent Intervention;  Surgeon: Burnell Blanks, MD;  Location: [place] [facility];  Service: Cardiovascular;  Laterality: N/A;  . Coronary stent placement     Family History  Problem Relation Age of Onset  . Heart disease Mother   . Cancer Father   . Colon cancer Neg Hx    Social History  Substance Use Topics  . Smoking status: Light Tobacco Smoker -- 0.25 packs/day    Types: Cigarettes  . Smokeless tobacco: Never Used  . Alcohol Use: Yes    Review of Systems  Constitutional: Negative for fever.  Gastrointestinal: Negative for nausea, vomiting and abdominal pain.  Genitourinary: Positive for dysuria and hematuria. Negative for flank pain.  All other systems reviewed and are negative.     Allergies  Review of patient's allergies indicates no known allergies.  Home Medications   Prior to Admission medications   Medication Sig Start Date End Date Taking? Authorizing Provider  aspirin 81 MG tablet Take 81 mg by mouth daily.     Historical Provider, MD  atorvastatin (LIPITOR) 20 MG tablet Take 1 tablet (20 mg total) by mouth daily at 6 PM. 10/05/15   Sherren Mocha, MD  carvedilol (COREG) 6.25 MG tablet TAKE 1 TABLET BY MOUTH TWICE DAILY WITH A MEAL 11/06/15   Sherren Mocha, MD  co-enzyme Q-10 30 MG capsule Take 30 mg by mouth daily.  Historical Provider, MD  levothyroxine (SYNTHROID, LEVOTHROID) 88 MCG tablet Take 1 tablet (88 mcg total) by mouth daily before breakfast. 10/05/15   Sherren Mocha, MD  metFORMIN (GLUCOPHAGE) 1000 MG tablet Take 1,000 mg by mouth 2 (two) times daily. 09/20/15   Historical Provider, MD  prasugrel (EFFIENT) 10 MG TABS tablet Take 1 tablet (10 mg total) by mouth daily. 04/24/15   Brittainy Erie Noe, PA-C   There were no vitals taken for this visit. Physical Exam  Constitutional: He is oriented to person, place, and time. He appears well-developed and well-nourished.  HENT:  Head:  Normocephalic and atraumatic.  Right Ear: External ear normal.  Left Ear: External ear normal.  Nose: Nose normal.  Eyes: Right eye exhibits no discharge. Left eye exhibits no discharge.  Neck: Neck supple.  Cardiovascular: Normal rate, regular rhythm, normal heart sounds and intact distal pulses.   Pulmonary/Chest: Effort normal and breath sounds normal.  Abdominal: Soft. He exhibits no distension. There is no tenderness.  Genitourinary:  Foley catheter in place. No penile tenderness or injury. No blood in foley bag. Normal yellow urine  Musculoskeletal: He exhibits no edema.  Neurological: He is alert and oriented to person, place, and time.  Skin: Skin is warm and dry.  Nursing note and vitals reviewed.   ED Course  Procedures (including critical care time) Labs Review Labs Reviewed  URINALYSIS, ROUTINE W REFLEX MICROSCOPIC (NOT AT [facility]) - Abnormal; Notable for the following:    Color, Urine AMBER (*)    APPearance TURBID (*)    Glucose, UA 100 (*)    Hgb urine dipstick LARGE (*)    Protein, ur 100 (*)    Leukocytes, UA MODERATE (*)    All other components within normal limits  URINE MICROSCOPIC-ADD ON - Abnormal; Notable for the following:    Squamous Epithelial / LPF 0-5 (*)    Bacteria, UA FEW (*)    All other components within normal limits    Imaging Review Ct Renal Stone Study  11/08/2015  CLINICAL DATA:  Right-sided flank pain for 2 days EXAM: CT ABDOMEN AND PELVIS WITHOUT CONTRAST TECHNIQUE: Multidetector CT imaging of the abdomen and pelvis was performed following the standard protocol without IV contrast. COMPARISON:  None. FINDINGS: Lung bases are well aerated with minimal scarring in the lingula. The liver, gallbladder, spleen, adrenal glands and pancreas are within normal limits. Kidneys are well visualized bilaterally and demonstrate bilateral cystic change. Additionally there is significant hydronephrosis and hydroureter identified bilaterally. This extends to  the level of the bladder without evidence of focal calculus. The left ureter inserts somewhat laterally on the bladder. The right ureter appears to insert posteriorly. The bladder is well distended. The prostate is mildly enlarged. No pelvic mass lesion or sidewall abnormality is noted. The appendix is within normal limits. Mild aortoiliac calcifications are seen. No osseous abnormality is noted. IMPRESSION: Bilateral severe hydronephrosis and hydroureter without evidence of obstructing lesion. The hydronephrosis may be related to the insertions of the ureters distally. No renal calculi are identified. No other focal abnormality is seen. Electronically Signed   By: Inez Catalina M.D.   On: 11/08/2015 08:43   I have personally reviewed and evaluated these images and lab results as part of my medical decision-making.   EKG Interpretation None      MDM   Final diagnoses:  Dysuria  Foley catheter in place    Patient mostly seems to have concerns about the Foley catheter and why it is  still in place since he was unable to see urology. Discussed that his hydronephrosis seems to been taking care of given his flank pain is gone. He is having good urine output. His dysuria is most likely from having the Foley catheter in place and it being irritating. However there were 6-30 WBCs in his urine on original urinalysis. We'll cover for infection. No indication for repeat blood work given that the flank pain is gone, likely his creatinine is improving. Follow-up with urology as scheduled.    Sherwood Gambler, MD 11/09/15 [id number]

## 2015-11-09 NOTE — ED Notes (Signed)
Pt stable, ambulatory, states understanding of discharge instructions 

## 2015-11-09 NOTE — ED Notes (Signed)
MD at bedside. 

## 2015-11-12 ENCOUNTER — Encounter (HOSPITAL_COMMUNITY)
Admission: RE | Admit: 2015-11-12 | Discharge: 2015-11-12 | Disposition: A | Discharge status: Home / Self Care | Payer: BLUE CROSS/BLUE SHIELD | Source: Ambulatory Visit | Attending: Cardiovascular Disease | Admitting: Cardiovascular Disease

## 2015-11-12 DIAGNOSIS — I213 ST elevation (STEMI) myocardial infarction of unspecified site: Secondary | ICD-10-CM | POA: Diagnosis not present

## 2015-11-12 LAB — GLUCOSE, CAPILLARY: GLUCOSE-CAPILLARY: 208 mg/dL — AB (ref 65–99)

## 2015-11-12 NOTE — Progress Notes (Signed)
Pt arrived at cardiac rehab reporting recent ED visit for flank pain. Pt was treated for hydronephrosis.  Pt was prescribed tamsulosin by urologist. Pt will pick up from pharmacy today and begin taking tonight. Pt is concerned about this diagnosis.  Pt advised to discuss further with urologist and his PCP.  Pt verbalized understanding.

## 2015-11-14 ENCOUNTER — Encounter (HOSPITAL_COMMUNITY): Payer: BLUE CROSS/BLUE SHIELD

## 2015-11-16 ENCOUNTER — Encounter (HOSPITAL_COMMUNITY)
Admission: RE | Admit: 2015-11-16 | Discharge: 2015-11-16 | Disposition: A | Discharge status: Home / Self Care | Payer: BLUE CROSS/BLUE SHIELD | Source: Ambulatory Visit | Attending: Cardiovascular Disease | Admitting: Cardiovascular Disease

## 2015-11-16 DIAGNOSIS — I213 ST elevation (STEMI) myocardial infarction of unspecified site: Secondary | ICD-10-CM | POA: Diagnosis not present

## 2015-11-16 LAB — GLUCOSE, CAPILLARY: Glucose-Capillary: 205 mg/dL — ABNORMAL HIGH (ref 65–99)

## 2015-11-16 NOTE — Progress Notes (Addendum)
Pt graduated from cardiac rehab program today with completion of 32 exercise sessions in Phase II. Pt maintained average attendance only scheduled to attend two days weekly. Although pt MET level did not significantly increase, pt was consistent with his exercise participation and gained valuable knowledge from staff and classmates. Upon arrival to program, pt was very skeptical about his health and health practitioners.   Pt is able to verbalize normal blood sugar values and correlation of meal/activity on readings.  Medication list reconciled. Pt not able to recognize names of all medications, however associates them with indication.  Pt given medication list with names, dosages and indication listed, highlighting cardiac medications. Pt states he has stopped taking several medicines last week due to fatigue. Pt contributes fatigue mostly to his metformin and lipitor. Ironically pt was taking cephalaxin last week which could have been cause of fatigue. Pt also states his sister has provided him with herbal supplements for prostate health and diabetes.  Pt is taking tamulosin as prescribed.  Pt instructed to resume metformin for 1 week then resume lipitor to see which medication reproduces his symptoms of fatigue.  Pt also advised to discuss medication concerns with PCP and/ or cardiology prior to holding.  Pt instructed on importance of each medication.  Understanding verbalized.  Repeat  PHQ score- 4.  Pt continues to have depression related to past experiences and his heart attack. Pt does admit to crying at times, with difficulty sleeping and fatigue.  Pt advised to discuss his symptoms and concerns with his PCP.  Pt agreed to this plan.  Pt feels he has achieved his goals during cardiac rehab.   Pt plans to continue exercising on his own walking.

## 2015-11-19 ENCOUNTER — Telehealth (HOSPITAL_COMMUNITY): Payer: Self-pay | Admitting: Cardiac Rehabilitation

## 2015-11-19 NOTE — Telephone Encounter (Signed)
-----   Message from Sherren Mocha, MD sent at 11/16/2015 11:22 PM EDT ----- Regarding: RE: cardiac rehab thx Lonna Rabold ----- Message -----    From: Lowell Guitar, RN    Sent: 11/16/2015   4:02 PM      To: Sherren Mocha, MD Subject: cardiac rehab                                  Dear Dr. Burt Knack,  Pt graduated from cardiac rehab today. No change in exercise capacity but pt did complete program and make subtle lifestyle changes which is very successful for him.     Pt did report to me he has not been taking atorvastatin 20mg  because it causes him fatigue.  However pt is also not taking metformin for this same reason.  I have instructed pt on importance of both meds and asked him to resume metformin first, see if he can tolerate then resume atorvastatin and see if he can tolerate. I have also asked him to discuss his concerns with you and his PCP.  I have also confirmed with pt he is compliant with all other medications.    Please advise.  Thank you, Andi Hence, RN, BSN Cardiac Pulmonary Rehab

## 2015-11-23 ENCOUNTER — Telehealth (HOSPITAL_COMMUNITY): Payer: Self-pay | Admitting: Cardiac Rehabilitation

## 2015-11-23 NOTE — Telephone Encounter (Signed)
-----   Message from Sherren Mocha, MD sent at 11/16/2015 11:22 PM EDT ----- Regarding: RE: cardiac rehab thx Henrique Parekh ----- Message -----    From: Lowell Guitar, RN    Sent: 11/16/2015   4:02 PM      To: Sherren Mocha, MD Subject: cardiac rehab                                  Dear Dr. Burt Knack,  Pt graduated from cardiac rehab today. No change in exercise capacity but pt did complete program and make subtle lifestyle changes which is very successful for him.     Pt did report to me he has not been taking atorvastatin 20mg  because it causes him fatigue.  However pt is also not taking metformin for this same reason.  I have instructed pt on importance of both meds and asked him to resume metformin first, see if he can tolerate then resume atorvastatin and see if he can tolerate. I have also asked him to discuss his concerns with you and his PCP.  I have also confirmed with pt he is compliant with all other medications.    Please advise.  Thank you, Andi Hence, RN, BSN Cardiac Pulmonary Rehab

## 2015-11-30 ENCOUNTER — Other Ambulatory Visit: Payer: Self-pay | Admitting: Cardiovascular Disease

## 2015-12-02 ENCOUNTER — Ambulatory Visit (INDEPENDENT_AMBULATORY_CARE_PROVIDER_SITE_OTHER): Payer: BLUE CROSS/BLUE SHIELD | Admitting: Family Medicine

## 2015-12-02 VITALS — BP 132/84 | HR 86 | Temp 97.4°F | Resp 16 | Ht 68.0 in | Wt 202.8 lb

## 2015-12-02 DIAGNOSIS — E119 Type 2 diabetes mellitus without complications: Secondary | ICD-10-CM

## 2015-12-02 DIAGNOSIS — R300 Dysuria: Secondary | ICD-10-CM

## 2015-12-02 DIAGNOSIS — N342 Other urethritis: Secondary | ICD-10-CM | POA: Diagnosis not present

## 2015-12-02 LAB — POCT URINALYSIS DIP (MANUAL ENTRY)
Bilirubin, UA: NEGATIVE
GLUCOSE UA: NEGATIVE
Ketones, POC UA: NEGATIVE
NITRITE UA: NEGATIVE
Spec Grav, UA: <=1.005
UROBILINOGEN UA: 0.2
pH, UA: 5

## 2015-12-02 LAB — POC MICROSCOPIC URINALYSIS (UMFC)

## 2015-12-02 LAB — URINE CULTURE
COLONY COUNT: NO GROWTH
ORGANISM ID, BACTERIA: NO GROWTH

## 2015-12-02 MED ORDER — NITROFURANTOIN MONOHYD MACRO 100 MG PO CAPS: 100.0000 mg | ORAL_CAPSULE | Freq: Two times a day (BID) | ORAL | Status: DC

## 2015-12-02 NOTE — Patient Instructions (Addendum)
Take nitrofurantoin one twice daily for 5 days  I will let you know if the culture shows anything of concern on your urine  If you need something more for constipation, I recommend MiraLAX which is a gentle laxative. You can use it once or twice a day until your stools get loose, then drop back to using it only on an as-needed basis.  Commend you discuss with your physician what to do about your diabetes. I think it is very important that you get your sugars down into good control.  The current day evidence is that the saw pimento  is of no real clinical value.  Constipation, Adult Constipation is when a person has fewer than three bowel movements a week, has difficulty having a bowel movement, or has stools that are dry, hard, or larger than normal. As people grow older, constipation is more common. A low-fiber diet, not taking in enough fluids, and taking certain medicines may make constipation worse.  CAUSES   Certain medicines, such as antidepressants, pain medicine, iron supplements, antacids, and water pills.   Certain diseases, such as diabetes, irritable bowel syndrome (IBS), thyroid disease, or depression.   Not drinking enough water.   Not eating enough fiber-rich foods.   Stress or travel.   Lack of physical activity or exercise.   Ignoring the urge to have a bowel movement.   Using laxatives too much.  SIGNS AND SYMPTOMS   Having fewer than three bowel movements a week.   Straining to have a bowel movement.   Having stools that are hard, dry, or larger than normal.   Feeling full or bloated.   Pain in the lower abdomen.   Not feeling relief after having a bowel movement.  DIAGNOSIS  Your health care provider will take a medical history and perform a physical exam. Further testing may be done for severe constipation. Some tests may include:  A barium enema X-ray to examine your rectum, colon, and, sometimes, your small intestine.   A  sigmoidoscopy to examine your lower colon.   A colonoscopy to examine your entire colon. TREATMENT  Treatment will depend on the severity of your constipation and what is causing it. Some dietary treatments include drinking more fluids and eating more fiber-rich foods. Lifestyle treatments may include regular exercise. If these diet and lifestyle recommendations do not help, your health care provider may recommend taking over-the-counter laxative medicines to help you have bowel movements. Prescription medicines may be prescribed if over-the-counter medicines do not work.  HOME CARE INSTRUCTIONS   Eat foods that have a lot of fiber, such as fruits, vegetables, whole grains, and beans.  Limit foods high in fat and processed sugars, such as french fries, hamburgers, cookies, candies, and soda.   A fiber supplement may be added to your diet if you cannot get enough fiber from foods.   Drink enough fluids to keep your urine clear or pale yellow.   Exercise regularly or as directed by your health care provider.   Go to the restroom when you have the urge to go. Do not hold it.   Only take over-the-counter or prescription medicines as directed by your health care provider. Do not take other medicines for constipation without talking to your health care provider first.  [place] IF:   You have bright red blood in your stool.   Your constipation lasts for more than 4 days or gets worse.   You have abdominal or rectal pain.  You have thin, pencil-like stools.   You have unexplained weight loss. MAKE SURE YOU:   Understand these instructions.  Will watch your condition.  Will get help right away if you are not doing well or get worse.   This information is not intended to replace advice given to you by your health care provider. Make sure you discuss any questions you have with your health care provider.   Document Released: 05/16/2004 Document Revised:  09/08/2014 Document Reviewed: 05/30/2013 Elsevier Interactive Patient Education 2016 [employer].     IF you received an x-ray today, you will receive an invoice from [facility]. Please contact [facility] at [phone] with questions or concerns regarding your invoice.   IF you received labwork today, you will receive an invoice from [employer]. Please contact [facility] at [phone] with questions or concerns regarding your invoice.   Our billing staff will not be able to assist you with questions regarding bills from these companies.  You will be contacted with the lab results as soon as they are available. The fastest way to get your results is to activate your My Chart account. Instructions are located on the last page of this paperwork. If you have not heard from [name] regarding the results in 2 weeks, please contact this office.

## 2015-12-02 NOTE — Progress Notes (Signed)
Patient ID: Nicolas Ray, male    DOB: Mar 11, 1951  Age: 65 y.o. MRN: [id number]  Chief Complaint  Patient presents with  . Dysuria    this morning around 5 am     Subjective:   Patient was in the hospital with some problems and had a catheter in. He is okay for the 2 weeks since catheter was taken out. He is on temazepam. He is urinating okay but started having burning in his penis today. Not been having any fever. No discharge. No risk of STDs. Otherwise he has done fairly well since hospitalization. He is diabetic. Doesn't like the idea of metformin since he saw some lawyer ads about it and read an article. Current allergies, medications, problem list, past/family and social histories reviewed.  Objective:  BP 132/84 mmHg  Pulse 86  Temp(Src) 97.4 F (36.3 C) (Oral)  Resp 16  Ht 5\' 8"  (1.727 m)  Wt 202 lb 12.8 oz (91.989 kg)  BMI 30.84 kg/m2  SpO2 98%  No major acute distress. Overweight. Chest clear. Heart regular without murmur. Normal male external genitalia.  Assessment & Plan:   Assessment: 1. Dysuria   2. Urethritis   3. Type 2 diabetes mellitus without complication, without long-term current use of insulin ([place])       Plan: He probably has a little urethritis causing his symptoms, secondary to having had the catheter. Will treat for a few days and see if that will go away. He is to continue his other medications. He needs to follow-up with his doctor to discuss his diabetic management.   Orders Placed This Encounter  Procedures  . Urine culture  . POCT Microscopic Urinalysis ([facility])  . POCT urinalysis dipstick    Meds ordered this encounter  Medications  . nitrofurantoin, macrocrystal-monohydrate, (MACROBID) 100 MG capsule    Sig: Take 1 capsule (100 mg total) by mouth 2 (two) times daily.    Dispense:  10 capsule    Refill:  0         Patient Instructions   Take nitrofurantoin one twice daily for 5 days  I will let you know if the culture shows  anything of concern on your urine  If you need something more for constipation, I recommend MiraLAX which is a gentle laxative. You can use it once or twice a day until your stools get loose, then drop back to using it only on an as-needed basis.  Commend you discuss with your physician what to do about your diabetes. I think it is very important that you get your sugars down into good control.  The current day evidence is that the saw pimento  is of no real clinical value.  Constipation, Adult Constipation is when a person has fewer than three bowel movements a week, has difficulty having a bowel movement, or has stools that are dry, hard, or larger than normal. As people grow older, constipation is more common. A low-fiber diet, not taking in enough fluids, and taking certain medicines may make constipation worse.  CAUSES   Certain medicines, such as antidepressants, pain medicine, iron supplements, antacids, and water pills.   Certain diseases, such as diabetes, irritable bowel syndrome (IBS), thyroid disease, or depression.   Not drinking enough water.   Not eating enough fiber-rich foods.   Stress or travel.   Lack of physical activity or exercise.   Ignoring the urge to have a bowel movement.   Using laxatives too much.  SIGNS AND SYMPTOMS  Having fewer than three bowel movements a week.   Straining to have a bowel movement.   Having stools that are hard, dry, or larger than normal.   Feeling full or bloated.   Pain in the lower abdomen.   Not feeling relief after having a bowel movement.  DIAGNOSIS  Your health care provider will take a medical history and perform a physical exam. Further testing may be done for severe constipation. Some tests may include:  A barium enema X-ray to examine your rectum, colon, and, sometimes, your small intestine.   A sigmoidoscopy to examine your lower colon.   A colonoscopy to examine your entire  colon. TREATMENT  Treatment will depend on the severity of your constipation and what is causing it. Some dietary treatments include drinking more fluids and eating more fiber-rich foods. Lifestyle treatments may include regular exercise. If these diet and lifestyle recommendations do not help, your health care provider may recommend taking over-the-counter laxative medicines to help you have bowel movements. Prescription medicines may be prescribed if over-the-counter medicines do not work.  HOME CARE INSTRUCTIONS   Eat foods that have a lot of fiber, such as fruits, vegetables, whole grains, and beans.  Limit foods high in fat and processed sugars, such as french fries, hamburgers, cookies, candies, and soda.   A fiber supplement may be added to your diet if you cannot get enough fiber from foods.   Drink enough fluids to keep your urine clear or pale yellow.   Exercise regularly or as directed by your health care provider.   Go to the restroom when you have the urge to go. Do not hold it.   Only take over-the-counter or prescription medicines as directed by your health care provider. Do not take other medicines for constipation without talking to your health care provider first.  [place] IF:   You have bright red blood in your stool.   Your constipation lasts for more than 4 days or gets worse.   You have abdominal or rectal pain.   You have thin, pencil-like stools.   You have unexplained weight loss. MAKE SURE YOU:   Understand these instructions.  Will watch your condition.  Will get help right away if you are not doing well or get worse.   This information is not intended to replace advice given to you by your health care provider. Make sure you discuss any questions you have with your health care provider.   Document Released: 05/16/2004 Document Revised: 09/08/2014 Document Reviewed: 05/30/2013 Elsevier Interactive Patient Education 2016  [employer].     IF you received an x-ray today, you will receive an invoice from [facility]. Please contact [facility] at [phone] with questions or concerns regarding your invoice.   IF you received labwork today, you will receive an invoice from [employer]. Please contact [facility] at [phone] with questions or concerns regarding your invoice.   Our billing staff will not be able to assist you with questions regarding bills from these companies.  You will be contacted with the lab results as soon as they are available. The fastest way to get your results is to activate your My Chart account. Instructions are located on the last page of this paperwork. If you have not heard from [name] regarding the results in 2 weeks, please contact this office.          Return if symptoms worsen or fail to improve.   Ainsley Sanguinetti, MD  12/02/2015  

## 2015-12-09 ENCOUNTER — Other Ambulatory Visit: Payer: Self-pay | Admitting: Cardiology

## 2015-12-10 NOTE — Telephone Encounter (Signed)
REFILL 

## 2015-12-17 NOTE — Progress Notes (Signed)
Reviewed home exercise with pt today.  Pt plans to walk for exercise.  Reviewed THR, pulse, RPE, sign and symptoms, NTG use, and when to call 911 or MD.  Also discussed weather considerations and indoor options.  Pt voiced understanding.    Kandi Brusseau,MS,ACSM RCEP 

## 2015-12-17 NOTE — Addendum Note (Signed)
Encounter addended by: Ronda Kazmi D Oday Ridings on: 12/17/2015 11:31 AM<BR>     Documentation filed: Clinical Notes

## 2015-12-31 ENCOUNTER — Other Ambulatory Visit: Payer: Self-pay

## 2015-12-31 ENCOUNTER — Other Ambulatory Visit: Payer: BLUE CROSS/BLUE SHIELD

## 2015-12-31 ENCOUNTER — Ambulatory Visit (HOSPITAL_COMMUNITY): Payer: BLUE CROSS/BLUE SHIELD | Attending: Cardiovascular Disease

## 2015-12-31 DIAGNOSIS — R29898 Other symptoms and signs involving the musculoskeletal system: Secondary | ICD-10-CM | POA: Insufficient documentation

## 2015-12-31 DIAGNOSIS — Z8249 Family history of ischemic heart disease and other diseases of the circulatory system: Secondary | ICD-10-CM | POA: Diagnosis not present

## 2015-12-31 DIAGNOSIS — I110 Hypertensive heart disease with heart failure: Secondary | ICD-10-CM | POA: Insufficient documentation

## 2015-12-31 DIAGNOSIS — I313 Pericardial effusion (noninflammatory): Secondary | ICD-10-CM | POA: Diagnosis not present

## 2015-12-31 DIAGNOSIS — I509 Heart failure, unspecified: Secondary | ICD-10-CM | POA: Diagnosis present

## 2015-12-31 DIAGNOSIS — I5022 Chronic systolic (congestive) heart failure: Secondary | ICD-10-CM | POA: Insufficient documentation

## 2015-12-31 DIAGNOSIS — I2510 Atherosclerotic heart disease of native coronary artery without angina pectoris: Secondary | ICD-10-CM | POA: Insufficient documentation

## 2015-12-31 DIAGNOSIS — Z720 Tobacco use: Secondary | ICD-10-CM | POA: Diagnosis not present

## 2015-12-31 DIAGNOSIS — E785 Hyperlipidemia, unspecified: Secondary | ICD-10-CM

## 2015-12-31 DIAGNOSIS — I255 Ischemic cardiomyopathy: Secondary | ICD-10-CM | POA: Insufficient documentation

## 2016-01-01 ENCOUNTER — Other Ambulatory Visit: Payer: Self-pay | Admitting: Family Medicine

## 2016-01-01 DIAGNOSIS — N183 Chronic kidney disease, stage 3 unspecified: Secondary | ICD-10-CM

## 2016-01-04 ENCOUNTER — Other Ambulatory Visit: Payer: BLUE CROSS/BLUE SHIELD

## 2016-01-07 ENCOUNTER — Encounter: Payer: Self-pay | Admitting: Physician Assistant

## 2016-01-07 ENCOUNTER — Ambulatory Visit
Admission: RE | Admit: 2016-01-07 | Discharge: 2016-01-07 | Disposition: A | Discharge status: Home / Self Care | Payer: BLUE CROSS/BLUE SHIELD | Source: Ambulatory Visit | Attending: Family Medicine | Admitting: Family Medicine

## 2016-01-07 ENCOUNTER — Ambulatory Visit (INDEPENDENT_AMBULATORY_CARE_PROVIDER_SITE_OTHER): Payer: BLUE CROSS/BLUE SHIELD | Admitting: Physician Assistant

## 2016-01-07 VITALS — BP 162/90 | HR 72 | Ht 68.0 in | Wt 197.0 lb

## 2016-01-07 DIAGNOSIS — N183 Chronic kidney disease, stage 3 unspecified: Secondary | ICD-10-CM

## 2016-01-07 DIAGNOSIS — E785 Hyperlipidemia, unspecified: Secondary | ICD-10-CM

## 2016-01-07 DIAGNOSIS — I10 Essential (primary) hypertension: Secondary | ICD-10-CM

## 2016-01-07 DIAGNOSIS — I255 Ischemic cardiomyopathy: Secondary | ICD-10-CM | POA: Diagnosis not present

## 2016-01-07 DIAGNOSIS — I2510 Atherosclerotic heart disease of native coronary artery without angina pectoris: Secondary | ICD-10-CM | POA: Diagnosis not present

## 2016-01-07 DIAGNOSIS — Z720 Tobacco use: Secondary | ICD-10-CM

## 2016-01-07 MED ORDER — PRAVASTATIN SODIUM 20 MG PO TABS: 20.0000 mg | ORAL_TABLET | Freq: Every evening | ORAL | Status: DC

## 2016-01-07 MED ORDER — HYDRALAZINE HCL 10 MG PO TABS
10.0000 mg | ORAL_TABLET | Freq: Two times a day (BID) | ORAL | Status: DC
Start: 2016-01-07 — End: 2016-08-21

## 2016-01-07 NOTE — Progress Notes (Signed)
Cardiology Office Note:    Date:  01/07/2016   ID:  [name], DOB November 17, 1950, MRN [id number]  PCP:  Anthoney Harada, MD  Cardiologist:  Dr. Sherren Mocha   Electrophysiologist:  n/a  Referring MD: No ref. provider found   Chief Complaint  Patient presents with  . Coronary Artery Disease    Follow up    History of Present Illness:     Nicolas Ray is a 65 y.o. male with a hx of CAD, ischemic cardiomyopathy, diabetes, HTN, non-adherence to medical therapy. He was admitted 04/2015 with lateral STEMI. Emergent LHC demonstrated an occluded D1 that was treated with a DES. He was noted to have severe LAD, severe OM and distal LCx stenosis and moderately severe ulcerated plaque in the proximal RCA. EF was 35-45%. He was seen by [facility] (Dr. Servando Snare). CABG was recommended. However the patient refused. He therefore underwent staged PCI with a DES to the mid LAD and DES to the proximal RCA. Echo demonstrated EF 40-45%. ACE inhibitor Rx has been limited by CKD. Renal Arterial US has not demonstrated significant renal artery stenosis.   Last seen by Dr. Sherren Mocha in 2/17.  He was intol of high dose statin.  Dr. Burt Knack rec trying low dose Lipitor 20.  Patient was still not willing to admit that he has diabetes.  FU echo last week with EF 35-40%.  Returns for FU.    He denies chest pain, significant dyspnea, orthopnea, PND or edema. Denies syncope. He stopped several of his medications. He could not tolerate atorvastatin 20 mg. He stopped several of his diabetic medications as well.  He still smokes.   Past Medical History  Diagnosis Date  . 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 Hypothyroidism   . Depression   . DM2 (diabetes mellitus, type 2) ([place])   . CAD (coronary artery disease) 04/18/2015    a. Lat STEMI 8/16 - LHC:  pLAD 90, mLAD 50, D1 100, dLCx 90, OM1 80, pRCA 75, EF 35-45% >> DES to D1;  b. staged PCI 04/20/15 - DES to mid  LAD and DES to prox RCA  . Ischemic cardiomyopathy     a. Echo 8/16:  Mild LVH, EF 40-45%, mid-apical anterolateral and apical AK, grade 1 diastolic dysfunction, trivial effusion    Past Surgical History  Procedure Laterality Date  . No prior surgery    . Cardiac catheterization N/A 04/18/2015    Procedure: Left Heart Cath and Coronary Angiography;  Surgeon: Sherren Mocha, MD;  Location: [place] [facility];  Service: Cardiovascular;  Laterality: N/A;  . Cardiac catheterization N/A 04/18/2015    Procedure: Coronary Stent Intervention;  Surgeon: Sherren Mocha, MD;  Location: [place] [facility];  Service: Cardiovascular;  Laterality: N/A;  . Cardiac catheterization N/A 04/20/2015    Procedure: Coronary Stent Intervention;  Surgeon: Burnell Blanks, MD;  Location: [place] [facility];  Service: Cardiovascular;  Laterality: N/A;  . Coronary stent placement      Current Medications: Outpatient Prescriptions Prior to Visit  Medication Sig Dispense Refill  . aspirin 81 MG tablet Take 81 mg by mouth daily.     . carvedilol (COREG) 6.25 MG tablet TAKE 1 TABLET BY MOUTH TWICE DAILY WITH A MEAL 60 tablet 3  . co-enzyme Q-10 30 MG capsule Take 30 mg by mouth daily.    . prasugrel (EFFIENT) 10 MG TABS tablet Take 1 tablet (10 mg total) by  mouth daily. 30 tablet 10  . cephALEXin (KEFLEX) 500 MG capsule Take 1 capsule (500 mg total) by mouth 2 (two) times daily. (Patient not taking: Reported on 11/16/2015) 10 capsule 0  . metFORMIN (GLUCOPHAGE) 1000 MG tablet Take 1,000 mg by mouth 2 (two) times daily. Reported on 01/07/2016  2  . tamsulosin (FLOMAX) 0.4 MG CAPS capsule Take 0.4 mg by mouth. Reported on 01/07/2016    . atorvastatin (LIPITOR) 20 MG tablet Take 1 tablet (20 mg total) by mouth daily at 6 PM. (Patient not taking: Reported on 11/16/2015) 30 tablet 6  . levothyroxine (SYNTHROID, LEVOTHROID) 50 MCG tablet Take 1 tablet (50 mcg total) by mouth daily before breakfast. KEEP OV. (Patient not taking:  Reported on 01/07/2016) 30 tablet 0  . nitrofurantoin, macrocrystal-monohydrate, (MACROBID) 100 MG capsule Take 1 capsule (100 mg total) by mouth 2 (two) times daily. (Patient not taking: Reported on 01/07/2016) 10 capsule 0   No facility-administered medications prior to visit.      Allergies:   Review of patient's allergies indicates no known allergies.   Social History   Social History  . Marital Status: Divorced    Spouse Name: N/A  . Number of Children: N/A  . Years of Education: N/A   Social History Main Topics  . Smoking status: Light Tobacco Smoker -- 0.25 packs/day    Types: Cigarettes  . Smokeless tobacco: Never Used  . Alcohol Use: Yes  . Drug Use: No  . Sexual Activity: Not Asked   Other Topics Concern  . None   Social History Narrative     Family History:  The patient's family history includes Cancer in his father; Heart disease in his mother. There is no history of Colon cancer.   ROS:   Please see the history of present illness.    ROS All other systems reviewed and are negative.   Physical Exam:    VS:  BP 162/90 mmHg  Pulse 72  Ht 5\' 8"  (1.727 m)  Wt 197 lb (89.359 kg)  BMI 29.96 kg/m2   GEN: Well nourished, well developed, in no acute distress HEENT: normal Neck: no JVD, no masses Cardiac: Normal S1/S2, RRR; no murmurs, rubs, or gallops, no edema;     Respiratory:  clear to auscultation bilaterally; no wheezing, rhonchi or rales GI: soft, nontender, nondistended MS: no deformity or atrophy Skin: warm and dry Neuro: No focal deficits  Psych: Alert and oriented x 3, normal affect  Wt Readings from Last 3 Encounters:  01/07/16 197 lb (89.359 kg)  12/02/15 202 lb 12.8 oz (91.989 kg)  11/08/15 204 lb (92.534 kg)      Studies/Labs Reviewed:     EKG:  EKG is  ordered today.  The ekg ordered today demonstrates NSR, HR 72, normal axis, septal Q waves, QTc 448 ms, no change from prior tracing  Recent Labs: 04/18/2015: TSH 25.863* 04/19/2015: B  Natriuretic Peptide 171.2* 09/14/2015: ALT 25 11/08/2015: BUN 29*; Creatinine, Ser 1.74*; Hemoglobin 12.4*; Platelets 184; Potassium 4.3; Sodium 136   Recent Lipid Panel    Component Value Date/Time   CHOL 86* 06/12/2015 0956   TRIG 82 06/12/2015 0956   HDL 29* 06/12/2015 0956   CHOLHDL 3.0 06/12/2015 0956   VLDL 16 06/12/2015 0956   LDLCALC 41 06/12/2015 0956    Additional studies/ records that were reviewed today include:   Echo 12/31/15 Mod LVH, EF 35-40%, apical AK, Gr 1 DD, trivial pericardial effusion.  Renal Artery [name] 9/16:  Bilateral  RAS 1-59%, SMA >70%  LHC 04/20/15 LAD: Proximal 90%, mid 50%, D1 stent patent LCx: Distal 90%, OM1 80% RCA: Proximal 75% PCI:  1. 2.75 x 32 mm Promus Premier DES to the mid LAD 2. 4 x 28 mm Promus premier DES to the proximal RCA  Echo 04/19/15 Mild LVH, EF 40-45%, mid-apical anterolateral and apical AK, grade 1 diastolic dysfunction, trivial effusion  LHC 04/18/15 LM: Normal LAD: Proximal 90%, mid 50%, severe diffuse LAD stenosis beyond origin of D1 D1: Occluded with heavy thrombus LCx: Distal 90%, OM1 80% RCA: Proximal 75% EF 35-45% PCI: 3 x 38 mm Xience DES to D1 Final conclusions:  Total occlusion of D1 treated with primary PCI using a long DES platform  Severe multivessel CAD with severe LAD stenosis, severe OM and distal LCx stenosis, and moderately severe ulcerated plaque in the proximal RCA  Moderate segmental LV systolic dysfunction c/w an acute lateral wall (diagonal) infarct Recommendations: Need to consider revascularization options (multivessel stenting versus CABG). The patient appears to be noncompliant based on history and current interaction. Will review with colleagues and discuss options further with patient.    ASSESSMENT:     1. Coronary artery disease involving native coronary artery of native heart without angina pectoris   2. Ischemic cardiomyopathy   3. Essential hypertension   4. Hyperlipidemia   5.  Tobacco abuse   6. CKD (chronic kidney disease), stage 3 (moderate)     PLAN:     In order of problems listed above:  1. Coronary Artery Disease: S/p lateral STEMI tx with DES to the D1. He had residual multivessel disease. Patient refused CABG and underwent PCI with DES to the LAD and RCA. He denies angina. He has been intolerant to several medications. He also seems to have poor understanding of his multiple comorbid conditions and is non-adherent to medical Rx.  We had a long discussion regarding the indications and benefits of all his medications today.  Continue ASA, Effient, beta-blocker.     2. Ischemic Cardiomyopathy: Continue beta-blocker. Recent echo with EF 35-40%. He has been unable to remain on ACE inhibitor due to worsening Creatinine. Start hydralazine 10 mg twice a day. If he can tolerate this, eventually increase to 3 times a day.  Eventually, see if he can tolerate nitrates.   3. Hypertension: BP elevated.  He has not had any medications yet today. Adjust medications as outlined.  4 HL - Could not tol Lipitor.  DC Lipitor.  Start Prava 20 mg QHS.  CMET, Lipids in 2 mos.    5. Tobacco Abuse: He is still trying to quit.   6. CKD stage 3 - Recent Creatinine stable.    Medication Adjustments/Labs and Tests Ordered: Current medicines are reviewed at length with the patient today.  Concerns regarding medicines are outlined above.  Medication changes, Labs and Tests ordered today are outlined in the Patient Instructions noted below. Patient Instructions  Medication Instructions:  1. STOP LIPITOR 2. START PRAVASTATIN 20 MG DAILY; RX SENT IN 3. START HYDRALAZINE 10 MG 1 TABLET TWICE DAILY; RX SENT IN Labwork: FASTING LIPID AND CMET TO BE DONE IN 2 MONTHS Testing/Procedures: NONE Follow-Up: Selin Eisler, PAC 2 MONTHS Any Other Special Instructions Will Be Listed Below (If Applicable). If you need a refill on your cardiac medications before your next  appointment, please call your pharmacy.    Signed, Richardson Dopp, PA-C  01/07/2016 5:05 PM    [address],  [place], [place]  [address] Phone: [phone]; Fax: [phone]

## 2016-01-07 NOTE — Patient Instructions (Addendum)
Medication Instructions:  1. STOP LIPITOR 2. START PRAVASTATIN 20 MG DAILY; RX SENT IN 3. START HYDRALAZINE 10 MG 1 TABLET TWICE DAILY; RX SENT IN Labwork: FASTING LIPID AND CMET TO BE DONE IN 2 MONTHS Testing/Procedures: NONE Follow-Up: SCOTT WEAVER, PAC 2 MONTHS Any Other Special Instructions Will Be Listed Below (If Applicable). If you need a refill on your cardiac medications before your next appointment, please call your pharmacy.

## 2016-01-08 DIAGNOSIS — R339 Retention of urine, unspecified: Secondary | ICD-10-CM | POA: Insufficient documentation

## 2016-01-08 DIAGNOSIS — N1330 Unspecified hydronephrosis: Secondary | ICD-10-CM | POA: Insufficient documentation

## 2016-01-09 ENCOUNTER — Other Ambulatory Visit: Payer: Self-pay | Admitting: Cardiology

## 2016-01-10 NOTE — Telephone Encounter (Signed)
Per 10/05/2015 patient instructions:  Medication Instructions:  Your physician has recommended you make the following change in your medication:  1. DECREASE Atorvastatin to 20mg  take one tablet by mouth every evening 2. We will give one time refill on Levothyroxine 88 mcg daily --future refills should come from PCP   This refill request should be sent to the PCP.

## 2016-01-27 ENCOUNTER — Emergency Department (HOSPITAL_COMMUNITY)
Admission: EM | Admit: 2016-01-27 | Discharge: 2016-01-27 | Disposition: A | Discharge status: Home / Self Care | Payer: BLUE CROSS/BLUE SHIELD | Attending: Emergency Medicine | Admitting: Emergency Medicine

## 2016-01-27 ENCOUNTER — Encounter (HOSPITAL_COMMUNITY): Payer: Self-pay

## 2016-01-27 DIAGNOSIS — R339 Retention of urine, unspecified: Secondary | ICD-10-CM | POA: Diagnosis present

## 2016-01-27 DIAGNOSIS — N400 Enlarged prostate without lower urinary tract symptoms: Secondary | ICD-10-CM

## 2016-01-27 DIAGNOSIS — I10 Essential (primary) hypertension: Secondary | ICD-10-CM | POA: Insufficient documentation

## 2016-01-27 DIAGNOSIS — Z7984 Long term (current) use of oral hypoglycemic drugs: Secondary | ICD-10-CM | POA: Diagnosis not present

## 2016-01-27 DIAGNOSIS — E039 Hypothyroidism, unspecified: Secondary | ICD-10-CM | POA: Diagnosis not present

## 2016-01-27 DIAGNOSIS — Z7982 Long term (current) use of aspirin: Secondary | ICD-10-CM | POA: Diagnosis not present

## 2016-01-27 DIAGNOSIS — Z8659 Personal history of other mental and behavioral disorders: Secondary | ICD-10-CM | POA: Diagnosis not present

## 2016-01-27 DIAGNOSIS — Z9861 Coronary angioplasty status: Secondary | ICD-10-CM | POA: Diagnosis not present

## 2016-01-27 DIAGNOSIS — Z79899 Other long term (current) drug therapy: Secondary | ICD-10-CM | POA: Diagnosis not present

## 2016-01-27 DIAGNOSIS — F17210 Nicotine dependence, cigarettes, uncomplicated: Secondary | ICD-10-CM | POA: Insufficient documentation

## 2016-01-27 DIAGNOSIS — Z9889 Other specified postprocedural states: Secondary | ICD-10-CM | POA: Diagnosis not present

## 2016-01-27 DIAGNOSIS — I2510 Atherosclerotic heart disease of native coronary artery without angina pectoris: Secondary | ICD-10-CM | POA: Diagnosis not present

## 2016-01-27 DIAGNOSIS — E119 Type 2 diabetes mellitus without complications: Secondary | ICD-10-CM | POA: Diagnosis not present

## 2016-01-27 DIAGNOSIS — N401 Enlarged prostate with lower urinary tract symptoms: Secondary | ICD-10-CM | POA: Diagnosis not present

## 2016-01-27 LAB — URINALYSIS, ROUTINE W REFLEX MICROSCOPIC
BILIRUBIN URINE: NEGATIVE
GLUCOSE, UA: NEGATIVE mg/dL
KETONES UR: NEGATIVE mg/dL
Nitrite: NEGATIVE
PH: 5.5 (ref 5.0–8.0)
PROTEIN: 30 mg/dL — AB
Specific Gravity, Urine: 1.015 (ref 1.005–1.030)

## 2016-01-27 LAB — URINE MICROSCOPIC-ADD ON
Bacteria, UA: NONE SEEN
RBC / HPF: NONE SEEN RBC/hpf (ref 0–5)
Squamous Epithelial / LPF: NONE SEEN

## 2016-01-27 MED ORDER — LIDOCAINE HCL 2 % EX GEL
1.0000 "application " | Freq: Once | CUTANEOUS | Status: AC
  Administered 2016-01-27: 1 via TOPICAL
  Filled 2016-01-27: qty 20

## 2016-01-27 NOTE — ED Notes (Signed)
Pt is due to have prostrate surgery 02-07-16, Pt has been using in and out cath x 1 week, x 2 times a day.  Pt is having pain when inserting catheter.  Pt was using, what sounds like a red rubber catheter, and is out of that kind and the kind he is using now is causing pain.

## 2016-01-27 NOTE — ED Provider Notes (Signed)
CSN: [id number]     Arrival date & time 01/27/16  1109 History   First MD Initiated Contact with Patient 01/27/16 1118     No chief complaint on file.    (Consider location/radiation/quality/duration/timing/severity/associated sxs/prior Treatment) The history is provided by the patient.  Patient indicates hx prostate hypertrophy, trouble voiding, and states self caths at home for the past few months.  States his doctors office gave him the wrong type of caths, and that now he just wants a foley catheter placed, and that he will follow up with his doctors. Denies hematuria. No dysuria. Making normal amount of urine. No flank pain.  Denies fever or chills. No nv.        Past Medical History  Diagnosis Date  . 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 Hypothyroidism   . Depression   . DM2 (diabetes mellitus, type 2) ([place])   . CAD (coronary artery disease) 04/18/2015    a. Lat STEMI 8/16 - LHC:  pLAD 90, mLAD 50, D1 100, dLCx 90, OM1 80, pRCA 75, EF 35-45% >> DES to D1;  b. staged PCI 04/20/15 - DES to mid LAD and DES to prox RCA  . Ischemic cardiomyopathy     a. Echo 8/16:  Mild LVH, EF 40-45%, mid-apical anterolateral and apical AK, grade 1 diastolic dysfunction, trivial effusion   Past Surgical History  Procedure Laterality Date  . No prior surgery    . Cardiac catheterization N/A 04/18/2015    Procedure: Left Heart Cath and Coronary Angiography;  Surgeon: Sherren Mocha, MD;  Location: [facility] [facility];  Service: Cardiovascular;  Laterality: N/A;  . Cardiac catheterization N/A 04/18/2015    Procedure: Coronary Stent Intervention;  Surgeon: Sherren Mocha, MD;  Location: [facility];  Service: Cardiovascular;  Laterality: N/A;  . Cardiac catheterization N/A 04/20/2015    Procedure: Coronary Stent Intervention;  Surgeon: Burnell Blanks, MD;  Location: [facility];  Service: Cardiovascular;  Laterality: N/A;  . Coronary  stent placement     Family History  Problem Relation Age of Onset  . Heart disease Mother   . Cancer Father   . Colon cancer Neg Hx    Social History  Substance Use Topics  . Smoking status: Light Tobacco Smoker -- 0.25 packs/day    Types: Cigarettes  . Smokeless tobacco: Never Used  . Alcohol Use: Yes    Review of Systems  Constitutional: Negative for fever and chills.  Respiratory: Negative for shortness of breath.   Cardiovascular: Negative for leg swelling.  Gastrointestinal: Negative for vomiting, abdominal pain and abdominal distention.  Genitourinary: Negative for dysuria, hematuria, scrotal swelling and testicular pain.      Allergies  Review of patient's allergies indicates no known allergies.  Home Medications   Prior to Admission medications   Medication Sig Start Date End Date Taking? Authorizing Provider  aspirin 81 MG tablet Take 81 mg by mouth daily.     Historical Provider, MD  carvedilol (COREG) 6.25 MG tablet TAKE 1 TABLET BY MOUTH TWICE DAILY WITH A MEAL 11/06/15   Sherren Mocha, MD  cephALEXin (KEFLEX) 500 MG capsule Take 1 capsule (500 mg total) by mouth 2 (two) times daily. Patient not taking: Reported on 11/16/2015 11/09/15   Sherwood Gambler, MD  co-enzyme Q-10 30 MG capsule Take 30 mg by mouth daily.    Historical Provider, MD  hydrALAZINE (APRESOLINE) 10 MG tablet Take 1 tablet (  10 mg total) by mouth 2 (two) times daily. 01/07/16   Liliane Shi, PA-C  levothyroxine (SYNTHROID, LEVOTHROID) 100 MCG tablet TAKE 0.100 MCG TABLET BY MOUTH ONCE EVERYDAY 01/01/16   Historical Provider, MD  metFORMIN (GLUCOPHAGE) 1000 MG tablet Take 1,000 mg by mouth 2 (two) times daily. Reported on 01/07/2016 09/20/15   Historical Provider, MD  ONGLYZA 2.5 MG TABS tablet Take 2.5 mg by mouth daily. 01/01/16   Historical Provider, MD  prasugrel (EFFIENT) 10 MG TABS tablet Take 1 tablet (10 mg total) by mouth daily. 04/24/15   Brittainy Erie Noe, PA-C  pravastatin (PRAVACHOL) 20 MG  tablet Take 1 tablet (20 mg total) by mouth every evening. 01/07/16   Liliane Shi, PA-C  tamsulosin (FLOMAX) 0.4 MG CAPS capsule Take 0.4 mg by mouth. Reported on 01/07/2016    Historical Provider, MD   BP 141/83 mmHg  Pulse 105  Temp(Src) 98.1 F (36.7 C) (Oral)  Resp 16  Ht 5\' 8"  (1.727 m)  Wt 87.771 kg  BMI 29.43 kg/m2  SpO2 97% Physical Exam  Constitutional: He appears well-developed and well-nourished. No distress.  Eyes: Conjunctivae are normal. No scleral icterus.  Neck: Neck supple. No tracheal deviation present.  Cardiovascular: Normal rate.   Pulmonary/Chest: Effort normal. No accessory muscle usage. No respiratory distress.  Abdominal: Soft. Bowel sounds are normal. He exhibits no distension. There is no tenderness.  Genitourinary:  No cva tenderness. Normal ext genitalia. No swelling or tenderness.   Musculoskeletal: He exhibits no edema.  Neurological: He is alert.  Skin: Skin is warm and dry. He is not diaphoretic.  Psychiatric: He has a normal mood and affect.  Nursing note and vitals reviewed.   ED Course  Procedures (including critical care time) Labs Review  Results for orders placed or performed during the hospital encounter of 01/27/16  Urinalysis, Routine w reflex microscopic (not at [facility])  Result Value Ref Range   Color, Urine YELLOW YELLOW   APPearance CLEAR CLEAR   Specific Gravity, Urine 1.015 1.005 - 1.030   pH 5.5 5.0 - 8.0   Glucose, UA NEGATIVE NEGATIVE mg/dL   Hgb urine dipstick TRACE (A) NEGATIVE   Bilirubin Urine NEGATIVE NEGATIVE   Ketones, ur NEGATIVE NEGATIVE mg/dL   Protein, ur 30 (A) NEGATIVE mg/dL   Nitrite NEGATIVE NEGATIVE   Leukocytes, UA SMALL (A) NEGATIVE  Urine microscopic-add on  Result Value Ref Range   Squamous Epithelial / LPF NONE SEEN NONE SEEN   WBC, UA 6-30 0 - 5 WBC/hpf   RBC / HPF NONE SEEN 0 - 5 RBC/hpf   Bacteria, UA NONE SEEN NONE SEEN   Urine-Other AMORPHOUS URATES/PHOSPHATES       I have personally  reviewed and evaluated these lab results as part of my medical decision-making.    MDM   Patient requests foley/leg bag, states has had in past.  Foley inserted.  Ua.  Reviewed nursing notes and prior charts for additional history.   No fevers.  Prior similar ua with neg cx. Will cx. Given afeb, no dysuria, nit neg, will hold rx.    Lajean Saver, MD 01/27/16 1343

## 2016-01-27 NOTE — Discharge Instructions (Signed)
It was our pleasure to provide your ER care today - we hope that you feel better.  Empty leg bag as need.  Do not attempt to remove catheter without the balloon in your bladder first being deflated, or severe injury to uretrha/bladder may occur.   Follow up with your doctor/urologist in the coming week.  Your blood pressure is high today - follow up with your primary care doctor in the next couple weeks.   Return to ER if worse, new symptoms, fevers, catheter not working, not making urine in bag, severe abdominal pain, other concern.    Foley Catheter Care, Adult A Foley catheter is a soft, flexible tube that is placed into the bladder to drain urine. A Foley catheter may be inserted if:  You leak urine or are not able to control when you urinate (urinary incontinence).  You are not able to urinate when you need to (urinary retention).  You had prostate surgery or surgery on the genitals.  You have certain medical conditions, such as multiple sclerosis, dementia, or a spinal cord injury. If you are going home with a Foley catheter in place, follow the instructions below. TAKING CARE OF THE CATHETER 1. Wash your hands with soap and water. 2. Using mild soap and warm water on a clean washcloth:  Clean the area on your body closest to the catheter insertion site using a circular motion, moving away from the catheter. Never wipe toward the catheter because this could sweep bacteria up into the urethra and cause infection.  Remove all traces of soap. Pat the area dry with a clean towel. For males, reposition the foreskin. 3. Attach the catheter to your leg so there is no tension on the catheter. Use adhesive tape or a leg strap. If you are using adhesive tape, remove any sticky residue left behind by the previous tape you used. 4. Keep the drainage bag below the level of the bladder, but keep it off the floor. 5. Check throughout the day to be sure the catheter is working and urine is  draining freely. Make sure the tubing does not become kinked. 6. Do not pull on the catheter or try to remove it. Pulling could damage internal tissues. TAKING CARE OF THE DRAINAGE BAGS You will be given two drainage bags to take home. One is a large overnight drainage bag, and the other is a smaller leg bag that fits underneath clothing. You may wear the overnight bag at any time, but you should never wear the smaller leg bag at night. Follow the instructions below for how to empty, change, and clean your drainage bags. Emptying the Drainage Bag You must empty your drainage bag when it is  - full or at least 2-3 times a day. 1. Wash your hands with soap and water. 2. Keep the drainage bag below your hips, below the level of your bladder. This stops urine from going back into the tubing and into your bladder. 3. Hold the dirty bag over the toilet or a clean container. 4. Open the pour spout at the bottom of the bag and empty the urine into the toilet or container. Do not let the pour spout touch the toilet, container, or any other surface. Doing so can place bacteria on the bag, which can cause an infection. 5. Clean the pour spout with a gauze pad or cotton ball that has rubbing alcohol on it. 6. Close the pour spout. 7. Attach the bag to your leg with adhesive  tape or a leg strap. 8. Wash your hands well. Changing the Drainage Bag Change your drainage bag once a month or sooner if it starts to smell bad or look dirty. Below are steps to follow when changing the drainage bag. 1. Wash your hands with soap and water. 2. Pinch off the rubber catheter so that urine does not spill out. 3. Disconnect the catheter tube from the drainage tube at the connection valve. Do not let the tubes touch any surface. 4. Clean the end of the catheter tube with an alcohol wipe. Use a different alcohol wipe to clean the end of the drainage tube. 5. Connect the catheter tube to the drainage tube of the clean drainage  bag. 6. Attach the new bag to the leg with adhesive tape or a leg strap. Avoid attaching the new bag too tightly. 7. Wash your hands well. Cleaning the Drainage Bag 1. Wash your hands with soap and water. 2. Wash the bag in warm, soapy water. 3. Rinse the bag thoroughly with warm water. 4. Fill the bag with a solution of white vinegar and water (1 cup vinegar to 1 qt warm water [.2 L vinegar to 1 L warm water]). Close the bag and soak it for 30 minutes in the solution. 5. Rinse the bag with warm water. 6. Hang the bag to dry with the pour spout open and hanging downward. 7. Store the clean bag (once it is dry) in a clean plastic bag. 8. Wash your hands well. PREVENTING INFECTION  Wash your hands before and after handling your catheter.  Take showers daily and wash the area where the catheter enters your body. Do not take baths. Replace wet leg straps with dry ones, if this applies.  Do not use powders, sprays, or lotions on the genital area. Only use creams, lotions, or ointments as directed by your caregiver.  For females, wipe from front to back after each bowel movement.  Drink enough fluids to keep your urine clear or pale yellow unless you have a fluid restriction.  Do not let the drainage bag or tubing touch or lie on the floor.  Wear cotton underwear to absorb moisture and to keep your skin drier. SEEK MEDICAL CARE IF:   Your urine is cloudy or smells unusually bad.  Your catheter becomes clogged.  You are not draining urine into the bag or your bladder feels full.  Your catheter starts to leak. SEEK IMMEDIATE MEDICAL CARE IF:   You have pain, swelling, redness, or pus where the catheter enters the body.  You have pain in the abdomen, legs, lower back, or bladder.  You have a fever.  You see blood fill the catheter, or your urine is pink or red.  You have nausea, vomiting, or chills.  Your catheter gets pulled out. MAKE SURE YOU:   Understand these  instructions.  Will watch your condition.  Will get help right away if you are not doing well or get worse.   This information is not intended to replace advice given to you by your health care provider. Make sure you discuss any questions you have with your health care provider.   Document Released: 08/18/2005 Document Revised: 01/02/2014 Document Reviewed: 08/09/2012 Elsevier Interactive Patient Education [employer].

## 2016-01-28 LAB — URINE CULTURE: Culture: NO GROWTH

## 2016-02-05 ENCOUNTER — Telehealth: Payer: Self-pay | Admitting: Cardiovascular Disease

## 2016-02-05 NOTE — Telephone Encounter (Signed)
Pt with an acute MI in August 2016, treated with multivessel stenting. He is obligated to 12 months of dual antiplatelet Rx, then would be ok to stop effient for one week before surgery. We will touch base with him at that time. If surgery is more emergent, please let us know. thx

## 2016-02-05 NOTE — Telephone Encounter (Signed)
New message      Request for surgical clearance:  1. What type of surgery is being performed? Turp  2. When is this surgery scheduled? pending  3. Are there any medications that need to be held prior to surgery and how long? Effient 10 mg tablet how long to be off the medication  4. Name of physician performing surgery? Dr. Lupita Leash  5. What is your office phone and fax number?[id number] office number [phone] ext [phone]

## 2016-02-05 NOTE — Telephone Encounter (Signed)
I contacted [facility] and they will have Nicolas Ray contact me tomorrow to further discuss this pt.

## 2016-02-06 NOTE — Telephone Encounter (Signed)
I spoke with Nicolas Ray and made her aware of Dr Antionette Char comments. The pt currently has urinary retention and has a foley catheter in place.  Nicolas Ray will discuss this pt with Dr Lupita Leash and they may be able to offer the pt a different procedure that would not require stopping Effient.  If Dr Lupita Leash feels this surgery is emergent or a procedure does need to be performed off of Effient then they will contact our office for further discussion.

## 2016-02-06 NOTE — Telephone Encounter (Signed)
Follow up ° ° ° ° ° °Returning a call to the nurse °

## 2016-02-07 ENCOUNTER — Telehealth: Payer: Self-pay | Admitting: Cardiovascular Disease

## 2016-02-07 NOTE — Telephone Encounter (Signed)
New message    Need a note    Request for surgical clearance:  1. What type of surgery is being performed? Turp   2. When is this surgery scheduled? Pending in August / June surgery is cancel    3. Are there any medications that need to be held prior to surgery and how long?effient    4. Name of physician performing surgery? Dr. Lupita Leash  5. What is your office phone and fax number? Fax [phone]

## 2016-02-07 NOTE — Telephone Encounter (Signed)
Message routed to Dr. Bess Kinds, RN Patient's surgery rescheduled to August (date pending) - will be 1 year post stent

## 2016-02-07 NOTE — Telephone Encounter (Signed)
Hold Effient 7 days prior to surgery. thx

## 2016-02-08 NOTE — Telephone Encounter (Signed)
Clearance routed via EPIC to Dr. Lupita Leash @ [facility]

## 2016-02-11 ENCOUNTER — Telehealth: Payer: Self-pay | Admitting: Cardiovascular Disease

## 2016-02-11 NOTE — Telephone Encounter (Signed)
I spoke with the Nicolas Ray and made him aware of the reason why Dr Burt Knack cannot authorize him holding Effient until 1 year post MI and stent placement. The Nicolas Ray is scheduled to follow-up with Dr Lupita Leash later this month.

## 2016-02-11 NOTE — Telephone Encounter (Signed)
New message   Pt is calling to have Dr.Cooper to have a discussion about why is he holding of the surgery

## 2016-02-26 ENCOUNTER — Telehealth: Payer: Self-pay | Admitting: Cardiovascular Disease

## 2016-02-26 NOTE — Telephone Encounter (Signed)
Ok to hold Eliquis x 7 days before surgery, as long as surgery is scheduled August 2017 or later. thx

## 2016-02-26 NOTE — Telephone Encounter (Signed)
Request for surgical clearance:  1. What type of surgery is being performed? TURP   2. When is this surgery scheduled? Sometime in August   3. Are there any medications that need to be held prior to surgery and how long?When in August can he stop his Effient?Dr Burt Knack said he had to be on Effient a year before he could stop it -Need to know this asap please-pt is there demanding this surgery be scheduled  4. Name of physician performing surgery? Dr Ollen Bowl   5. What is your office phone and fax number? Fax#[phone]  6.

## 2016-02-27 NOTE — Telephone Encounter (Signed)
Forwarded to Dr. Hoyt Koch office at fax [phone].

## 2016-03-05 ENCOUNTER — Other Ambulatory Visit: Payer: BLUE CROSS/BLUE SHIELD

## 2016-03-06 ENCOUNTER — Encounter (INDEPENDENT_AMBULATORY_CARE_PROVIDER_SITE_OTHER): Payer: Self-pay

## 2016-03-06 ENCOUNTER — Other Ambulatory Visit (INDEPENDENT_AMBULATORY_CARE_PROVIDER_SITE_OTHER): Payer: Medicare Other

## 2016-03-06 DIAGNOSIS — I2510 Atherosclerotic heart disease of native coronary artery without angina pectoris: Secondary | ICD-10-CM

## 2016-03-06 LAB — COMPREHENSIVE METABOLIC PANEL
ALT: 12 U/L (ref 9–46)
AST: 13 U/L (ref 10–35)
Albumin: 3.9 g/dL (ref 3.6–5.1)
Alkaline Phosphatase: 61 U/L (ref 40–115)
BUN: 23 mg/dL (ref 7–25)
CHLORIDE: 104 mmol/L (ref 98–110)
CO2: 22 mmol/L (ref 20–31)
CREATININE: 1.47 mg/dL — AB (ref 0.70–1.25)
Calcium: 8.7 mg/dL (ref 8.6–10.3)
GLUCOSE: 149 mg/dL — AB (ref 65–99)
Potassium: 4.5 mmol/L (ref 3.5–5.3)
SODIUM: 134 mmol/L — AB (ref 135–146)
TOTAL PROTEIN: 7.1 g/dL (ref 6.1–8.1)
Total Bilirubin: 0.6 mg/dL (ref 0.2–1.2)

## 2016-03-06 LAB — LIPID PANEL
CHOL/HDL RATIO: 4.5 ratio (ref <=5.0)
Cholesterol: 148 mg/dL (ref 125–200)
HDL: 33 mg/dL — AB (ref >=40)
LDL CALC: 93 mg/dL (ref <130)
Triglycerides: 110 mg/dL (ref <150)
VLDL: 22 mg/dL (ref <30)

## 2016-03-07 ENCOUNTER — Telehealth: Payer: Self-pay | Admitting: *Deleted

## 2016-03-07 ENCOUNTER — Other Ambulatory Visit: Payer: Self-pay | Admitting: *Deleted

## 2016-03-07 MED ORDER — CARVEDILOL 6.25 MG PO TABS
ORAL_TABLET | ORAL | Status: DC
Start: 2016-03-07 — End: 2016-08-21

## 2016-03-07 NOTE — Telephone Encounter (Signed)
F/u Message ° °Pt returning RN call. Please call back to discuss  °

## 2016-03-07 NOTE — Telephone Encounter (Signed)
Lmtcb to go over lab results and recommendations.  

## 2016-03-07 NOTE — Telephone Encounter (Signed)
Pt notified of lab results. When I stated to pt to increase Pravastatin he said he was not taking this, Pt states also not taking Hydralazine, or ASA 81. I asked pt did a dr. advise him to stop these meds, He said no the only that he was told to stop was ASA 81 a few days before his surgery. Pt states surgery is not until 8/4. I asked pt why did he stop ASA so soon. Pt states he mis on Effient and that is a blood thinner too so he felt he did not need to take both of these medications. I advised pt to start back on ASA 81 mg daily that this works in conjunction with the [place]. Pt said he thinks the Pravastatin makes him feel sluggish and this is why he stopped medication. I asked pt to resume the Hydralazine 10 mg BID and Pravastatin 20 mg QHS. I advised pt that I will d/w Brynda Rim. PA about the Pravastatin dose since he has not been taking it I advised to go back on his regular dose at this time.  I advised pt to let us know 7/10 at his appt if going back on the Pravastatin for the weekend made he feel sluggish again. Pt agreeable to plan of care and all instructions. I advised to keep that appt.

## 2016-03-09 NOTE — Progress Notes (Signed)
Cardiology Office Note:    Date:  03/10/2016   ID:  Nicolas Ray, DOB 05-02-1951, MRN [id number]  PCP:  Anthoney Harada, MD  Cardiologist:  Dr. Sherren Mocha   Electrophysiologist:  n/a  Referring MD: Vernie Shanks, MD   Chief Complaint  Patient presents with  . Coronary Artery Disease    Follow up    History of Present Illness:     Nicolas Ray is a 64 y.o. male with a hx of CAD, ischemic cardiomyopathy, diabetes, HTN, non-adherence to medical therapy. He was admitted in 8/16 with lateral STEMI. Emergent LHC demonstrated an occluded D1 that was treated with a DES. He was noted to have severe LAD, severe OM and distal LCx stenosis and moderately severe ulcerated plaque in the proximal RCA. EF was 35-45%. He was seen by [facility] (Dr. Servando Snare) and CABG was recommended. However the patient refused. He therefore underwent staged PCI with a DES to the mid LAD and DES to the proximal RCA. Echo demonstrated EF 40-45%. ACE inhibitor Rx has been limited by CKD. Renal Arterial US has not demonstrated significant renal artery stenosis.   FU echo in 5/17 with EF 35-40%.   He was last seen by me in 5/17.  He has significant non-adherence to med Rx.  His recent lipids prompted adjustment in his statin dose.  He told our staff he decided to stop his ASA.  Notes indicate that Effient could be held for future urologic surgery once he was 12 mos out from his MI/PCI.  He was not advised to hold ASA.  He is not taking statin Rx consistently.  He returns for FU.    Here alone.  I spent a considerable amount of time with the patient discussing all of his medications, again.  I have done this with him each time that he comes in.  He recently stopped taking Pravastatin b/c his sister told him it was dangerous.  He still smokes.  He does not FU with PCP for diabetes.  He stopped taking Metformin.  He has changed his diet.  His sister told him to start taking Berberine (herbal supplement). His sister is not  a health care provider.  He denies chest pain, dyspnea, syncope, orthopnea, PND, edema, bleeding.    Past Medical History  Diagnosis Date  . 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 Hypothyroidism   . Depression   . DM2 (diabetes mellitus, type 2) ([place])   . CAD (coronary artery disease) 04/18/2015    a. Lat STEMI 8/16 - LHC:  pLAD 90, mLAD 50, D1 100, dLCx 90, OM1 80, pRCA 75, EF 35-45% >> DES to D1;  b. staged PCI 04/20/15 - DES to mid LAD and DES to prox RCA  . Ischemic cardiomyopathy     a. Echo 8/16:  Mild LVH, EF 40-45%, mid-apical anterolateral and apical AK, grade 1 diastolic dysfunction, trivial effusion    Past Surgical History  Procedure Laterality Date  . No prior surgery    . Cardiac catheterization N/A 04/18/2015    Procedure: Left Heart Cath and Coronary Angiography;  Surgeon: Sherren Mocha, MD;  Location: [facility];  Service: Cardiovascular;  Laterality: N/A;  . Cardiac catheterization N/A 04/18/2015    Procedure: Coronary Stent Intervention;  Surgeon: Sherren Mocha, MD;  Location: [facility];  Service: Cardiovascular;  Laterality: N/A;  . Cardiac catheterization N/A 04/20/2015    Procedure: Coronary Stent Intervention;  Surgeon: Burnell Blanks, MD;  Location: [facility];  Service: Cardiovascular;  Laterality: N/A;  . Coronary stent placement      Current Medications: Outpatient Prescriptions Prior to Visit  Medication Sig Dispense Refill  . aspirin 81 MG tablet Take 81 mg by mouth daily.     . carvedilol (COREG) 6.25 MG tablet TAKE 1 TABLET BY MOUTH TWICE DAILY WITH A MEAL 180 tablet 2  . hydrALAZINE (APRESOLINE) 10 MG tablet Take 1 tablet (10 mg total) by mouth 2 (two) times daily. 180 tablet 3  . levothyroxine (SYNTHROID, LEVOTHROID) 100 MCG tablet TAKE 0.100 MCG TABLET BY MOUTH ONCE EVERYDAY  5  . prasugrel (EFFIENT) 10 MG TABS tablet Take 1 tablet (10 mg total) by mouth daily. 30 tablet  10  . tamsulosin (FLOMAX) 0.4 MG CAPS capsule Take 0.4 mg by mouth. Reported on 01/07/2016    . co-enzyme Q-10 30 MG capsule Take 30 mg by mouth daily.    . pravastatin (PRAVACHOL) 20 MG tablet Take 1 tablet (20 mg total) by mouth every evening. 90 tablet 3  . cephALEXin (KEFLEX) 500 MG capsule Take 1 capsule (500 mg total) by mouth 2 (two) times daily. (Patient not taking: Reported on 11/16/2015) 10 capsule 0  . metFORMIN (GLUCOPHAGE) 1000 MG tablet Take 1,000 mg by mouth 2 (two) times daily. Reported on 03/10/2016  2  . ONGLYZA 2.5 MG TABS tablet Take 2.5 mg by mouth daily. Reported on 03/10/2016  5   No facility-administered medications prior to visit.      Allergies:   Review of patient's allergies indicates no known allergies.   Social History   Social History  . Marital Status: Divorced    Spouse Name: N/A  . Number of Children: N/A  . Years of Education: N/A   Social History Main Topics  . Smoking status: Light Tobacco Smoker -- 0.25 packs/day    Types: Cigarettes  . Smokeless tobacco: Never Used  . Alcohol Use: Yes  . Drug Use: No  . Sexual Activity: Not Asked   Other Topics Concern  . None   Social History Narrative     Family History:  The patient's family history includes Cancer in his father; Heart disease in his mother. There is no history of Colon cancer.   ROS:   Please see the history of present illness.    ROS All other systems reviewed and are negative.   Physical Exam:    VS:  BP 110/78 mmHg  Pulse 74  Ht 5\' 8"  (1.727 m)  Wt 196 lb 12.8 oz (89.268 kg)  BMI 29.93 kg/m2   Physical Exam  Constitutional: He is oriented to person, place, and time. He appears well-developed and well-nourished.  HENT:  Head: Normocephalic and atraumatic.  Neck: No JVD present.  Cardiovascular: Normal rate, regular rhythm and normal heart sounds.   No murmur heard. Pulmonary/Chest: He has no wheezes. He has no rales.  Decreased breath sounds bilaterally  Abdominal:  Soft. There is no tenderness.  Musculoskeletal: He exhibits no edema.  Neurological: He is alert and oriented to person, place, and time.  Skin: Skin is warm and dry.  Psychiatric: He has a normal mood and affect.    Wt Readings from Last 3 Encounters:  03/10/16 196 lb 12.8 oz (89.268 kg)  01/27/16 193 lb 8 oz (87.771 kg)  01/07/16 197 lb (89.359 kg)      Studies/Labs Reviewed:     EKG:  EKG is  ordered  today.  The ekg ordered today demonstrates NSR, HR 74, normal axis, septal Q waves, QTc 435 ms, no changes  Recent Labs: 04/18/2015: TSH 25.863* 04/19/2015: B Natriuretic Peptide 171.2* 11/08/2015: Hemoglobin 12.4*; Platelets 184 03/06/2016: ALT 12; BUN 23; Creat 1.47*; Potassium 4.5; Sodium 134*   Recent Lipid Panel    Component Value Date/Time   CHOL 148 03/06/2016 0929   TRIG 110 03/06/2016 0929   HDL 33* 03/06/2016 0929   CHOLHDL 4.5 03/06/2016 0929   VLDL 22 03/06/2016 0929   LDLCALC 93 03/06/2016 0929    Additional studies/ records that were reviewed today include:   Echo 12/31/15 Mod LVH, EF 35-40%, apical AK, Gr 1 DD, trivial pericardial effusion.  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 2.75 x 32 mm Promus Premier DES to the mid LAD 2. 4 x 28 mm Promus premier DES to the proximal RCA  Echo 04/19/15 Mild LVH, EF 40-45%, mid-apical anterolateral and apical AK, grade 1 diastolic dysfunction, trivial effusion  LHC 04/18/15 LM: Normal LAD: Proximal 90%, mid 50%, severe diffuse LAD stenosis beyond origin of D1 D1: Occluded with heavy thrombus LCx: Distal 90%, OM1 80% RCA: Proximal 75% EF 35-45% PCI: 3 x 38 mm Xience DES to D1 Final conclusions:  Total occlusion of D1 treated with primary PCI using a long DES platform  Severe multivessel CAD with severe LAD stenosis, severe OM and distal LCx stenosis, and moderately severe ulcerated  plaque in the proximal RCA  Moderate segmental LV systolic dysfunction c/w an acute lateral wall (diagonal) infarct Recommendations: Need to consider revascularization options (multivessel stenting versus CABG). The patient appears to be noncompliant based on history and current interaction. Will review with colleagues and discuss options further with patient.    ASSESSMENT:     1. Coronary artery disease involving native coronary artery of native heart without angina pectoris   2. Ischemic cardiomyopathy   3. Hyperlipidemia   4. Type 2 diabetes mellitus with complication, unspecified long term insulin use status ([place])   5. Tobacco abuse     PLAN:     In order of problems listed above:  1. Coronary Artery Disease: S/p lateral STEMI tx with DES to the D1. He had residual multivessel disease. Patient refused CABG and underwent PCI with DES to the LAD and RCA. He denies angina. He has been intolerant to several medications. He also seems to have poor understanding of his multiple comorbid conditions and is non-adherent to medical Rx. We again had a long discussion regarding the indications and benefits of all his medications today. Continue ASA, Effient, beta-blocker. He does need urologic surgery in August. We again discussed the importance of continuing dual antiplatelet therapy until he is 12 months out from his myocardial infarction. Ideally, the patient should remain on aspirin without interruption even at the time of his surgery to help reduce the risk of stent thrombosis. Dr. Burt Knack has already recommended that the patient hold Effient 7 days prior to surgery. He should resume this after surgery when felt to be safe by his urologist.    2. Ischemic Cardiomyopathy: Continue beta-blocker, hydralazine. Recent echo with EF 35-40%. He has been unable to remain on ACE inhibitor due to worsening Creatinine. He does not take PDE-5 inhibitors. Start isosorbide mononitrate 15 mg daily. I  warned him of the potential interaction with PDE-5 inhibitors.  3. Hyperlipidemia  - We discussed the importance of aggressive treatment for hyperlipidemia in the setting of CAD. Continue atorvastatin 20 mg daily.  Obtain follow-up lipids and LFTs in 3 months.  He is currently not willing to consider PCSK-9 inhibitor Rx.   4. Diabetes  - We again discussed the importance of follow-up with primary care and management of diabetes. I have recommended that he follow-up with primary care.     5. Tobacco Abuse - I have again recommended cessation.    Medication Adjustments/Labs and Tests Ordered: Current medicines are reviewed at length with the patient today.  Concerns regarding medicines are outlined above.  Medication changes, Labs and Tests ordered today are outlined in the Patient Instructions noted below. Patient Instructions  Medication Instructions:  Start Isosorbide Mononitrate 15 mg (1/2 of 30 mg tablet) daily.  DO NOT TAKE Levitra, Cialis or Viagra.  These drugs can cause a dangerous drop in your blood pressure if taken along with Isosorbide (or any "nitrate"). It is important that you take Aspirin, Effient, Atorvastatin, Hydralazine and Carvedilol on a daily basis.  Labwork: In 3 months - Fasting Lipids and LFTs (04/16/16)  Testing/Procedures: None   Follow-Up: Dr. Sherren Mocha in 4 months.   Any Other Special Instructions Will Be Listed Below (If Applicable). I will send my note to your urologist.  It is our recommendation to stay on Aspirin when you have your surgery. If Dr. Lupita Leash feels that it is too dangerous because of bleeding, he will let you know what to do regarding your Aspirin. You should not stop Effient.  You can hold Effient for 7 days prior to your surgery and resume it as soon as Dr. Lupita Leash says it is safe. You need to follow up with primary care right away to manage your diabetes.  It is dangerous to not control your diabetes. You need to stop smoking.  Continued smoking will lead to more heart attacks.  If you need a refill on your cardiac medications before your next appointment, please call your pharmacy.    Signed, Richardson Dopp, PA-C  03/10/2016 10:56 AM    [facility] [address], [place], [place]  [address] Phone: [phone]; Fax: [phone]

## 2016-03-10 ENCOUNTER — Encounter: Payer: Self-pay | Admitting: Physician Assistant

## 2016-03-10 ENCOUNTER — Ambulatory Visit (INDEPENDENT_AMBULATORY_CARE_PROVIDER_SITE_OTHER): Payer: BLUE CROSS/BLUE SHIELD | Admitting: Physician Assistant

## 2016-03-10 VITALS — BP 110/78 | HR 74 | Ht 68.0 in | Wt 196.8 lb

## 2016-03-10 DIAGNOSIS — Z720 Tobacco use: Secondary | ICD-10-CM

## 2016-03-10 DIAGNOSIS — I255 Ischemic cardiomyopathy: Secondary | ICD-10-CM

## 2016-03-10 DIAGNOSIS — E118 Type 2 diabetes mellitus with unspecified complications: Secondary | ICD-10-CM | POA: Diagnosis not present

## 2016-03-10 DIAGNOSIS — I2510 Atherosclerotic heart disease of native coronary artery without angina pectoris: Secondary | ICD-10-CM

## 2016-03-10 DIAGNOSIS — E785 Hyperlipidemia, unspecified: Secondary | ICD-10-CM | POA: Diagnosis not present

## 2016-03-10 MED ORDER — ATORVASTATIN CALCIUM 20 MG PO TABS: 20.0000 mg | ORAL_TABLET | Freq: Every day | ORAL | Status: DC

## 2016-03-10 MED ORDER — ISOSORBIDE MONONITRATE ER 30 MG PO TB24: 15.0000 mg | ORAL_TABLET | Freq: Every day | ORAL | Status: DC

## 2016-03-10 NOTE — Patient Instructions (Addendum)
Medication Instructions:  Start Isosorbide Mononitrate 15 mg (1/2 of 30 mg tablet) daily.  DO NOT TAKE Levitra, Cialis or Viagra.  These drugs can cause a dangerous drop in your blood pressure if taken along with Isosorbide (or any "nitrate"). It is important that you take Aspirin, Effient, Atorvastatin, Hydralazine and Carvedilol on a daily basis.  Labwork: In 3 months - Fasting Lipids and LFTs (04/16/16)  Testing/Procedures: None   Follow-Up: Dr. Sherren Mocha in 4 months.   Any Other Special Instructions Will Be Listed Below (If Applicable). I will send my note to your urologist.  It is our recommendation to stay on Aspirin when you have your surgery. If Dr. Lupita Leash feels that it is too dangerous because of bleeding, he will let you know what to do regarding your Aspirin. You should not stop Effient.  You can hold Effient for 7 days prior to your surgery and resume it as soon as Dr. Lupita Leash says it is safe. You need to follow up with primary care right away to manage your diabetes.  It is dangerous to not control your diabetes. You need to stop smoking. Continued smoking will lead to more heart attacks.  If you need a refill on your cardiac medications before your next appointment, please call your pharmacy.

## 2016-03-26 ENCOUNTER — Other Ambulatory Visit: Payer: Self-pay | Admitting: Cardiology

## 2016-03-27 NOTE — Telephone Encounter (Signed)
REFILL 

## 2016-04-13 ENCOUNTER — Emergency Department (HOSPITAL_COMMUNITY)
Admission: EM | Admit: 2016-04-13 | Discharge: 2016-04-13 | Disposition: A | Discharge status: Home / Self Care | Payer: PPO | Attending: Emergency Medicine | Admitting: Emergency Medicine

## 2016-04-13 ENCOUNTER — Encounter (HOSPITAL_COMMUNITY): Payer: Self-pay | Admitting: *Deleted

## 2016-04-13 DIAGNOSIS — I110 Hypertensive heart disease with heart failure: Secondary | ICD-10-CM | POA: Diagnosis not present

## 2016-04-13 DIAGNOSIS — Z7982 Long term (current) use of aspirin: Secondary | ICD-10-CM | POA: Insufficient documentation

## 2016-04-13 DIAGNOSIS — Z955 Presence of coronary angioplasty implant and graft: Secondary | ICD-10-CM | POA: Diagnosis not present

## 2016-04-13 DIAGNOSIS — I25110 Atherosclerotic heart disease of native coronary artery with unstable angina pectoris: Secondary | ICD-10-CM | POA: Diagnosis not present

## 2016-04-13 DIAGNOSIS — F17210 Nicotine dependence, cigarettes, uncomplicated: Secondary | ICD-10-CM | POA: Insufficient documentation

## 2016-04-13 DIAGNOSIS — R339 Retention of urine, unspecified: Secondary | ICD-10-CM | POA: Diagnosis not present

## 2016-04-13 DIAGNOSIS — I5021 Acute systolic (congestive) heart failure: Secondary | ICD-10-CM | POA: Diagnosis not present

## 2016-04-13 DIAGNOSIS — E039 Hypothyroidism, unspecified: Secondary | ICD-10-CM | POA: Insufficient documentation

## 2016-04-13 DIAGNOSIS — E119 Type 2 diabetes mellitus without complications: Secondary | ICD-10-CM | POA: Insufficient documentation

## 2016-04-13 DIAGNOSIS — I252 Old myocardial infarction: Secondary | ICD-10-CM | POA: Diagnosis not present

## 2016-04-13 LAB — URINALYSIS, ROUTINE W REFLEX MICROSCOPIC
Bilirubin Urine: NEGATIVE
GLUCOSE, UA: 100 mg/dL — AB
Ketones, ur: NEGATIVE mg/dL
Nitrite: NEGATIVE
PROTEIN: 30 mg/dL — AB
Specific Gravity, Urine: 1.015 (ref 1.005–1.030)
pH: 5.5 (ref 5.0–8.0)

## 2016-04-13 LAB — URINE MICROSCOPIC-ADD ON

## 2016-04-13 MED ORDER — LIDOCAINE HCL 2 % EX GEL
1.0000 "application " | Freq: Once | CUTANEOUS | Status: AC
  Administered 2016-04-13: 1 via TOPICAL
  Filled 2016-04-13: qty 20

## 2016-04-13 NOTE — ED Provider Notes (Signed)
[facility] DEPT Provider Note   CSN: [id number] Arrival date & time: 04/13/16  1928  First Provider Contact:  None       History   Chief Complaint Chief Complaint  Patient presents with  . Urinary Retention    HPI Nicolas Ray is a 65 y.o. male.  The patient is a 65 year old male, he has a history of prostatic abnormalities that have required a Foley catheter placement 3 months ago, he is scheduled to have prostate surgery tomorrow, his Foley catheter and his drainage bag have been working perfectly until today approximately 6 hours ago when he had no more urinary output and a progressive discomfort in his lower abdomen. There has been no pain in the penis, no fevers, no chills nausea vomiting or back pain. The symptoms are persistent, mild to moderate, gradually worsening      Past Medical History:  Diagnosis Date  . CAD (coronary artery disease) 04/18/2015   a. Lat STEMI 8/16 - LHC:  pLAD 90, mLAD 50, D1 100, dLCx 90, OM1 80, pRCA 75, EF 35-45% >> DES to D1;  b. staged PCI 04/20/15 - DES to mid LAD and DES to prox RCA  . Depression   . DM2 (diabetes mellitus, type 2) ([place])   . HTN (hypertension) 02/08/2013   Renal Artery [name] 9/16: Bilateral RAS 1-59%, SMA >70%  . Hypothyroidism   . Ischemic cardiomyopathy    a. Echo 8/16:  Mild LVH, EF 40-45%, mid-apical anterolateral and apical AK, grade 1 diastolic dysfunction, trivial effusion    Patient Active Problem List   Diagnosis Date Noted  . Coronary artery disease involving native coronary artery of native heart with unstable angina pectoris ([place])   . Type II diabetes mellitus with complication ([place]) [id number]  . Acute systolic heart failure ([place]) 04/19/2015  . Acute MI, lateral wall, initial episode of care ([place]) 04/18/2015  . ST elevation myocardial infarction (STEMI) of lateral wall ([place]) 04/18/2015  . Other male erectile dysfunction 04/18/2014  . Diabetes ([place]) 10/13/2013  . Sciatica 03/15/2013  . Hypothyroidism  02/08/2013  . Essential hypertension 02/08/2013  . Depression 01/25/2013  . Low back pain 01/25/2013    Past Surgical History:  Procedure Laterality Date  . CARDIAC CATHETERIZATION N/A 04/18/2015   Procedure: Left Heart Cath and Coronary Angiography;  Surgeon: Sherren Mocha, MD;  Location: [facility];  Service: Cardiovascular;  Laterality: N/A;  . CARDIAC CATHETERIZATION N/A 04/18/2015   Procedure: Coronary Stent Intervention;  Surgeon: Sherren Mocha, MD;  Location: [facility];  Service: Cardiovascular;  Laterality: N/A;  . CARDIAC CATHETERIZATION N/A 04/20/2015   Procedure: Coronary Stent Intervention;  Surgeon: Burnell Blanks, MD;  Location: [facility];  Service: Cardiovascular;  Laterality: N/A;  . CORONARY STENT PLACEMENT    . no prior surgery         Home Medications    Prior to Admission medications   Medication Sig Start Date End Date Taking? Authorizing Provider  aspirin EC 81 MG tablet Take 81 mg by mouth daily.    Historical Provider, MD  atorvastatin (LIPITOR) 20 MG tablet Take 1 tablet (20 mg total) by mouth daily. 03/10/16   Liliane Shi, PA-C  carvedilol (COREG) 6.25 MG tablet TAKE 1 TABLET BY MOUTH TWICE DAILY WITH A MEAL 03/07/16   Sherren Mocha, MD  Cholecalciferol (VITAMIN D PO) Take 1 tablet by mouth daily.    Historical Provider, MD  EFFIENT 10 MG TABS tablet TAKE 1 TABLET BY  MOUTH EVERY DAY 03/27/16   Brittainy Erie Noe, PA-C  hydrALAZINE (APRESOLINE) 10 MG tablet Take 1 tablet (10 mg total) by mouth 2 (two) times daily. 01/07/16   Liliane Shi, PA-C  isosorbide mononitrate (IMDUR) 30 MG 24 hr tablet Take 0.5 tablets (15 mg total) by mouth daily. 03/10/16   Liliane Shi, PA-C  levothyroxine (SYNTHROID, LEVOTHROID) 100 MCG tablet TAKE 0.100 MCG TABLET BY MOUTH ONCE EVERYDAY 01/01/16   Historical Provider, MD  phytonadione (VITAMIN K) 5 MG tablet TAKE 1 CAPSULE BY MOUTH DAILY PATIENT NOT SURE OF DOSAGE    Historical Provider, MD    tamsulosin (FLOMAX) 0.4 MG CAPS capsule Take 0.4 mg by mouth. Reported on 01/07/2016    Historical Provider, MD    Family History Family History  Problem Relation Age of Onset  . Heart disease Mother   . Cancer Father   . Colon cancer Neg Hx     Social History Social History  Substance Use Topics  . Smoking status: Light Tobacco Smoker    Packs/day: 0.25    Types: Cigarettes  . Smokeless tobacco: Never Used  . Alcohol use Yes     Allergies   Review of patient's allergies indicates no known allergies.   Review of Systems Review of Systems  All other systems reviewed and are negative.    Physical Exam Updated Vital Signs BP 128/75   Pulse 78   Temp 97.3 F (36.3 C)   Resp 20   Wt 201 lb 5 oz (91.3 kg)   SpO2 96%   BMI 30.61 kg/m   Physical Exam  Constitutional: He appears well-developed and well-nourished.  HENT:  Head: Normocephalic and atraumatic.  Eyes: Conjunctivae are normal. Right eye exhibits no discharge. Left eye exhibits no discharge.  Pulmonary/Chest: Effort normal. No respiratory distress.  Abdominal: Soft. There is tenderness ( Moderate tenderness to the suprapubic region with fullness).  Genitourinary:  Genitourinary Comments: Normal appearing penis, Foley catheter well seated, no urine in the Foley bag  Musculoskeletal: He exhibits no edema.  Neurological: He is alert. Coordination normal.  Skin: Skin is warm and dry. No rash noted. He is not diaphoretic. No erythema.  Psychiatric: He has a normal mood and affect.  Nursing note and vitals reviewed.    ED Treatments / Results  Labs (all labs ordered are listed, but only abnormal results are displayed) Labs Reviewed  URINALYSIS, ROUTINE W REFLEX MICROSCOPIC (NOT AT [facility]) - Abnormal; Notable for the following:       Result Value   APPearance CLOUDY (*)    Glucose, UA 100 (*)    Hgb urine dipstick LARGE (*)    Protein, ur 30 (*)    Leukocytes, UA LARGE (*)    All other components within  normal limits  URINE MICROSCOPIC-ADD ON - Abnormal; Notable for the following:    Squamous Epithelial / LPF 0-5 (*)    Bacteria, UA FEW (*)    All other components within normal limits  URINE CULTURE     Radiology No results found.  Procedures Procedures (including critical care time)  Medications Ordered in ED Medications  lidocaine (XYLOCAINE) 2 % jelly 1 application (1 application Topical Given 04/13/16 2035)     Initial Impression / Assessment and Plan / ED Course  I have reviewed the triage vital signs and the nursing notes.  Pertinent labs & imaging results that were available during my care of the patient were reviewed by me and considered in my medical  decision making (see chart for details).  Clinical Course  Comment By Time  The patient has urinary retention, there is an ultrasound confirmed large bladder volume, only catheter likely blocked, this will need to be replaced, urinalysis will be sent, the patient is otherwise well-appearing. Noemi Chapel, MD 08/13 2006  UA with bacteria WBC, few bacteria - BP improved after catheter placed - stable for d/c. Culture sent  Noemi Chapel, MD 08/13 2142    Vitals:   04/13/16 1937 04/13/16 1941 04/13/16 2126  BP: (!) 207/85  128/75  Pulse: 96  78  Resp: 24  20  Temp: 97.3 F (36.3 C)    SpO2: 100%  96%  Weight:  201 lb 5 oz (91.3 kg)      Final Clinical Impressions(s) / ED Diagnoses   Final diagnoses:  Urinary retention    New Prescriptions New Prescriptions   No medications on file     Noemi Chapel, MD 04/13/16 2143

## 2016-04-13 NOTE — ED Triage Notes (Signed)
The pt is due for prostate surgery  Tomorrow  But he cannot get any relief

## 2016-04-13 NOTE — ED Notes (Signed)
Pt refusing to be placed on the monitor, repeatedly states "Im fine I just need this changed". Charge nurse notified to swap out foley.

## 2016-04-13 NOTE — Discharge Instructions (Signed)
It is normal to have bacteria in your urine when you have a catheter in your bladder I have sent a culture If your tests turn positive for an infection on the culture you will get a phone call Please go to your appointment for surgery in the morning

## 2016-04-13 NOTE — ED Triage Notes (Signed)
The pt has a foley cath that is clogged  And he has not had any urine drainage since  6 hours ago

## 2016-04-14 DIAGNOSIS — I2510 Atherosclerotic heart disease of native coronary artery without angina pectoris: Secondary | ICD-10-CM | POA: Diagnosis not present

## 2016-04-14 DIAGNOSIS — I10 Essential (primary) hypertension: Secondary | ICD-10-CM | POA: Diagnosis not present

## 2016-04-14 DIAGNOSIS — Z7984 Long term (current) use of oral hypoglycemic drugs: Secondary | ICD-10-CM | POA: Diagnosis not present

## 2016-04-14 DIAGNOSIS — Z79899 Other long term (current) drug therapy: Secondary | ICD-10-CM | POA: Diagnosis not present

## 2016-04-14 DIAGNOSIS — N400 Enlarged prostate without lower urinary tract symptoms: Secondary | ICD-10-CM | POA: Diagnosis not present

## 2016-04-14 DIAGNOSIS — N401 Enlarged prostate with lower urinary tract symptoms: Secondary | ICD-10-CM | POA: Diagnosis not present

## 2016-04-14 DIAGNOSIS — N138 Other obstructive and reflux uropathy: Secondary | ICD-10-CM | POA: Diagnosis not present

## 2016-04-14 DIAGNOSIS — N529 Male erectile dysfunction, unspecified: Secondary | ICD-10-CM | POA: Diagnosis not present

## 2016-04-14 DIAGNOSIS — F17200 Nicotine dependence, unspecified, uncomplicated: Secondary | ICD-10-CM | POA: Diagnosis not present

## 2016-04-14 DIAGNOSIS — E119 Type 2 diabetes mellitus without complications: Secondary | ICD-10-CM | POA: Diagnosis not present

## 2016-04-14 DIAGNOSIS — I252 Old myocardial infarction: Secondary | ICD-10-CM | POA: Diagnosis not present

## 2016-04-14 DIAGNOSIS — E785 Hyperlipidemia, unspecified: Secondary | ICD-10-CM | POA: Diagnosis not present

## 2016-04-14 DIAGNOSIS — Z955 Presence of coronary angioplasty implant and graft: Secondary | ICD-10-CM | POA: Diagnosis not present

## 2016-04-14 DIAGNOSIS — R338 Other retention of urine: Secondary | ICD-10-CM | POA: Diagnosis not present

## 2016-04-14 DIAGNOSIS — R339 Retention of urine, unspecified: Secondary | ICD-10-CM | POA: Diagnosis not present

## 2016-04-14 DIAGNOSIS — Z7982 Long term (current) use of aspirin: Secondary | ICD-10-CM | POA: Diagnosis not present

## 2016-04-15 ENCOUNTER — Other Ambulatory Visit: Payer: Self-pay | Admitting: Cardiovascular Disease

## 2016-04-15 DIAGNOSIS — E785 Hyperlipidemia, unspecified: Secondary | ICD-10-CM

## 2016-04-16 LAB — URINE CULTURE: Culture: 80000 — AB

## 2016-04-17 ENCOUNTER — Telehealth: Payer: Self-pay | Admitting: *Deleted

## 2016-04-17 NOTE — Telephone Encounter (Signed)
Post ED Visit - Positive Culture Follow-up  Culture report reviewed by antimicrobial stewardship pharmacist:  []  Elenor Quinones, Pharm.D. []  Heide Guile, Pharm.D., BCPS []  Parks Neptune, Pharm.D. []  Alycia Rossetti, Pharm.D., BCPS []  [place], Pharm.D., BCPS, AAHIVP []  Legrand Como, Pharm.D., BCPS, AAHIVP []  Milus Glazier, Pharm.D. []  Stephens November, Pharm.D. Shary Decamp, PA-C  Positive urine culture, foley x 3 months, large bladder volume and cather to be replaced. No further patient follow-up is required at this time.  Harlon Flor [facility] 04/17/2016, 9:09 AM

## 2016-04-22 ENCOUNTER — Telehealth: Payer: Self-pay | Admitting: Cardiovascular Disease

## 2016-04-22 DIAGNOSIS — R339 Retention of urine, unspecified: Secondary | ICD-10-CM | POA: Diagnosis not present

## 2016-04-22 NOTE — Telephone Encounter (Signed)
New Message:    Pt was off his medicine for surgery,says he is feeling fine. Please call,he wants to know what medicine he needs to go back on.

## 2016-04-22 NOTE — Telephone Encounter (Signed)
I spoke with the pt and he would like to know what medications he needs to be taking now that he had his urology surgery. I advised him that he should still be on all of his cardiac medications because we did not advise him to stop anything due to surgery.  We only instructed to hold Effient 7 days prior to surgery and resume when Dr Lupita Leash says it is safe.  The pt is taking this medication now.  I also made him aware that he needs to be taking ASA, Atorvastatin, Carvedilol, Hydralazine and Isosorbide [place].

## 2016-05-06 ENCOUNTER — Telehealth: Payer: Self-pay | Admitting: Physician Assistant

## 2016-05-06 NOTE — Telephone Encounter (Signed)
Left message to call back  

## 2016-05-06 NOTE — Telephone Encounter (Signed)
Addition to the prescription message   Pt verbalized that he has no chest pain and he said he hasn't had any since the surgery, he hasn't started the medication  The pharmacist told him that the medication that was prescribed is for chest pain  Please call pt

## 2016-05-06 NOTE — Telephone Encounter (Signed)
Ok to stop this medicine and resume only if chest pain begins to be a problem for him. It is not 'life-saving' but might help reduce chest pain/angina. thanks

## 2016-05-06 NOTE — Telephone Encounter (Signed)
Pt aware and will d/c medication for now.  If he should experience chest tightness/pain he will call back to make [name] aware.

## 2016-05-06 NOTE — Telephone Encounter (Signed)
Spoke with patient who is requesting Dr Burt Knack tell him whether or not he needs to remain on Isosorbide 30 mg 1/2 tablet daily.  He states the pharmacist told him he is taking this for chest pain but "I don't have chest pain."  Advised this medication is given to him for the prevention of chest pain as it is a long acting nitroglycerin to help increase blood flow to his heart tissue.  He again requests Dr Burt Knack advise as to whether he should continue this medication.  Instructed him I will forward to Dr Burt Knack for review and someone will call back with recommendations.

## 2016-05-06 NOTE — Telephone Encounter (Signed)
New message  Pt verbalized that he received a call from the pharmacist and that a proscription was called in for him by our practice and it was for the medication Isosorbide Mono Iterate 30mg  tablets

## 2016-05-22 DIAGNOSIS — R339 Retention of urine, unspecified: Secondary | ICD-10-CM | POA: Diagnosis not present

## 2016-06-16 ENCOUNTER — Other Ambulatory Visit: Payer: PPO | Admitting: *Deleted

## 2016-06-16 DIAGNOSIS — I2510 Atherosclerotic heart disease of native coronary artery without angina pectoris: Secondary | ICD-10-CM | POA: Diagnosis not present

## 2016-06-16 LAB — HEPATIC FUNCTION PANEL
ALBUMIN: 3.9 g/dL (ref 3.6–5.1)
ALK PHOS: 59 U/L (ref 40–115)
ALT: 14 U/L (ref 9–46)
AST: 15 U/L (ref 10–35)
BILIRUBIN TOTAL: 0.9 mg/dL (ref 0.2–1.2)
Bilirubin, Direct: 0.2 mg/dL (ref <=0.2)
Indirect Bilirubin: 0.7 mg/dL (ref 0.2–1.2)
TOTAL PROTEIN: 7.3 g/dL (ref 6.1–8.1)

## 2016-06-16 LAB — LIPID PANEL
CHOL/HDL RATIO: 3.6 ratio (ref <=5.0)
Cholesterol: 107 mg/dL — ABNORMAL LOW (ref 125–200)
HDL: 30 mg/dL — AB (ref >=40)
LDL CALC: 51 mg/dL (ref <130)
TRIGLYCERIDES: 128 mg/dL (ref <150)
VLDL: 26 mg/dL (ref <30)

## 2016-06-17 ENCOUNTER — Telehealth: Payer: Self-pay | Admitting: *Deleted

## 2016-06-17 NOTE — Telephone Encounter (Signed)
Pt notified of lab results by phone with verbal understanding. Also scheduled pt for his 4 month f/u per recall in system. Appt 07/18/16 @ 10:15 with Brynda Rim. PA same day Dr. Burt Knack is in the office. Pt agreeable to plan of care.

## 2016-06-30 DIAGNOSIS — R05 Cough: Secondary | ICD-10-CM | POA: Diagnosis not present

## 2016-07-01 DIAGNOSIS — H5340 Unspecified visual field defects: Secondary | ICD-10-CM | POA: Diagnosis not present

## 2016-07-01 DIAGNOSIS — E113413 Type 2 diabetes mellitus with severe nonproliferative diabetic retinopathy with macular edema, bilateral: Secondary | ICD-10-CM | POA: Diagnosis not present

## 2016-07-01 DIAGNOSIS — H524 Presbyopia: Secondary | ICD-10-CM | POA: Diagnosis not present

## 2016-07-14 ENCOUNTER — Ambulatory Visit: Payer: PPO | Admitting: Internal Medicine

## 2016-07-18 ENCOUNTER — Encounter (INDEPENDENT_AMBULATORY_CARE_PROVIDER_SITE_OTHER): Payer: Self-pay

## 2016-07-18 ENCOUNTER — Encounter: Payer: Self-pay | Admitting: Physician Assistant

## 2016-07-18 ENCOUNTER — Ambulatory Visit (INDEPENDENT_AMBULATORY_CARE_PROVIDER_SITE_OTHER): Payer: PPO | Admitting: Physician Assistant

## 2016-07-18 VITALS — BP 140/90 | HR 76 | Ht 68.0 in | Wt 207.0 lb

## 2016-07-18 DIAGNOSIS — I2510 Atherosclerotic heart disease of native coronary artery without angina pectoris: Secondary | ICD-10-CM

## 2016-07-18 DIAGNOSIS — E118 Type 2 diabetes mellitus with unspecified complications: Secondary | ICD-10-CM | POA: Diagnosis not present

## 2016-07-18 DIAGNOSIS — I255 Ischemic cardiomyopathy: Secondary | ICD-10-CM | POA: Diagnosis not present

## 2016-07-18 DIAGNOSIS — E7800 Pure hypercholesterolemia, unspecified: Secondary | ICD-10-CM | POA: Diagnosis not present

## 2016-07-18 DIAGNOSIS — Z720 Tobacco use: Secondary | ICD-10-CM

## 2016-07-18 LAB — BASIC METABOLIC PANEL
BUN: 24 mg/dL (ref 7–25)
CALCIUM: 9.1 mg/dL (ref 8.6–10.3)
CO2: 20 mmol/L (ref 20–31)
CREATININE: 1.47 mg/dL — AB (ref 0.70–1.25)
Chloride: 106 mmol/L (ref 98–110)
Glucose, Bld: 154 mg/dL — ABNORMAL HIGH (ref 65–99)
Potassium: 4.5 mmol/L (ref 3.5–5.3)
Sodium: 135 mmol/L (ref 135–146)

## 2016-07-18 LAB — HEMOGLOBIN A1C
HEMOGLOBIN A1C: 7.8 % — AB (ref <5.7)
MEAN PLASMA GLUCOSE: 177 mg/dL

## 2016-07-18 NOTE — Patient Instructions (Signed)
Medication Instructions:  No changes.  See your medication list.  Labwork: Today - BMET, Hgb A1c, Vitamin D level  Testing/Procedures: None   Follow-Up: Dr. Sherren Mocha in 6 months.  Any Other Special Instructions Will Be Listed Below (If Applicable). I will check with Dr. Burt Knack about your Effient and let you know if we plan to change it.  Continue the Effient for now until you hear back from [name]. You really need to get back and see your primary care doctor for continued follow up and management of your diabetes.  If you need a refill on your cardiac medications before your next appointment, please call your pharmacy.

## 2016-07-18 NOTE — Progress Notes (Signed)
Cardiology Office Note:    Date:  07/18/2016   ID:  Nicolas Ray, DOB 01/16/1951, MRN [id number]  PCP:  Anthoney Harada, MD  Cardiologist:  Dr. Sherren Mocha   Electrophysiologist:  n/a  Referring MD: Vernie Shanks, MD   Chief Complaint  Patient presents with  . Follow-up    CAD, CHF    History of Present Illness:    Nicolas Ray is a 65 y.o. male with a hx of CAD, ischemic cardiomyopathy, diabetes, HTN, non-adherence to medical therapy. He was admitted in 8/16 with lateral STEMI treated with a DES to the D1. He was noted to have severe LAD, severe OM and distal LCx stenosis and moderately severe ulcerated plaque in the proximal RCA. EF was 35-45%. He was seen by [facility] (Dr. Servando Snare) and CABG was recommended. However the patient refused. He therefore underwent staged PCI with a DES to the mid LAD and DES to the proximal RCA. Echo demonstrated EF 40-45%. ACE inhibitor Rx has been limited by CKD. Renal Arterial US has not demonstrated significant renal artery stenosis. FU echo in 5/17 with EF 35-40%.   The patient has had difficulty following directions for his medications in the past which has significantly impacted her ability to care for him. He has not taken statin therapy consistently. He has stopped aspirin on his own without direction to do so. Family members have told him medications are dangerous prompting him to stop them.  Patient was last seen 03/10/16.  I placed him on nitrates to advance his Rx of CHF.  However, he was told by the pharmacist that this medicine is for chest pain and it appears he was going to stop the drug after calling our office.  He underwent TURP for BPH in 8/15 at [facility] with Dr. Ollen Bowl.    He is here for routine follow up.  He continues to smoke on occasion. He shows me several vitamins that he takes. He takes Mg, Vit D, Coenzyme Q10.  He stopped going to his PCP.  He no longer takes anything for his diabetes.  He denies chest pain,  shortness of breath, orthopnea, PND, edema.  He denies syncope.   Prior CV studies that were reviewed today include:    Echo 12/31/15 Mod LVH, EF 35-40%, apical AK, Gr 1 DD, trivial pericardial effusion.  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 2.75 x 32 mm Promus Premier DES to the mid LAD 2. 4 x 28 mm Promus premier DES to the proximal RCA  Echo 04/19/15 Mild LVH, EF 40-45%, mid-apical anterolateral and apical AK, grade 1 diastolic dysfunction, trivial effusion  LHC 04/18/15 LM: Normal LAD: Proximal 90%, mid 50%, severe diffuse LAD stenosis beyond origin of D1 D1: Occluded with heavy thrombus LCx: Distal 90%, OM1 80% RCA: Proximal 75% EF 35-45% PCI: 3 x 38 mm Xience DES to D1   Past Medical History:  Diagnosis Date  . CAD (coronary artery disease) 04/18/2015   a. Lat STEMI 8/16 - LHC:  pLAD 90, mLAD 50, D1 100, dLCx 90, OM1 80, pRCA 75, EF 35-45% >> DES to D1;  b. staged PCI 04/20/15 - DES to mid LAD and DES to prox RCA  . Depression   . DM2 (diabetes mellitus, type 2) ([place])   . HTN (hypertension) 02/08/2013   Renal Artery [name] 9/16: Bilateral RAS 1-59%,  SMA >70%  . Hypothyroidism   . Ischemic cardiomyopathy    a. Echo 8/16:  Mild LVH, EF 40-45%, mid-apical anterolateral and apical AK, grade 1 diastolic dysfunction, trivial effusion    Past Surgical History:  Procedure Laterality Date  . CARDIAC CATHETERIZATION N/A 04/18/2015   Procedure: Left Heart Cath and Coronary Angiography;  Surgeon: Sherren Mocha, MD;  Location: [facility];  Service: Cardiovascular;  Laterality: N/A;  . CARDIAC CATHETERIZATION N/A 04/18/2015   Procedure: Coronary Stent Intervention;  Surgeon: Sherren Mocha, MD;  Location: [facility];  Service: Cardiovascular;  Laterality: N/A;  . CARDIAC CATHETERIZATION N/A 04/20/2015   Procedure: Coronary Stent  Intervention;  Surgeon: Burnell Blanks, MD;  Location: [facility];  Service: Cardiovascular;  Laterality: N/A;  . CORONARY STENT PLACEMENT    . no prior surgery      Current Medications: Current Meds  Medication Sig  . aspirin EC 81 MG tablet Take 81 mg by mouth daily.  Marland Kitchen atorvastatin (LIPITOR) 20 MG tablet TAKE 1 TABLET(20 MG) BY MOUTH DAILY AT 6 PM  . carvedilol (COREG) 6.25 MG tablet TAKE 1 TABLET BY MOUTH TWICE DAILY WITH A MEAL  . Cholecalciferol (VITAMIN D PO) Take 1 tablet by mouth daily.  Marland Kitchen EFFIENT 10 MG TABS tablet TAKE 1 TABLET BY MOUTH EVERY DAY  . hydrALAZINE (APRESOLINE) 10 MG tablet Take 1 tablet (10 mg total) by mouth 2 (two) times daily.  . isosorbide mononitrate (IMDUR) 30 MG 24 hr tablet Take 15 mg by mouth daily.  Marland Kitchen levothyroxine (SYNTHROID, LEVOTHROID) 100 MCG tablet TAKE 0.100 MCG TABLET BY MOUTH ONCE EVERYDAY  . phytonadione (VITAMIN K) 5 MG tablet TAKE 1 CAPSULE BY MOUTH DAILY PATIENT NOT SURE OF DOSAGE  . tamsulosin (FLOMAX) 0.4 MG CAPS capsule Take 0.4 mg by mouth. Reported on 01/07/2016  . [DISCONTINUED] atorvastatin (LIPITOR) 20 MG tablet Take 1 tablet (20 mg total) by mouth daily.     Allergies:   Patient has no known allergies.   Social History   Social History  . Marital status: Divorced    Spouse name: N/A  . Number of children: N/A  . Years of education: N/A   Social History Main Topics  . Smoking status: Light Tobacco Smoker    Packs/day: 0.25    Types: Cigarettes  . Smokeless tobacco: Never Used  . Alcohol use Yes  . Drug use: No  . Sexual activity: Not Asked   Other Topics Concern  . None   Social History Narrative  . None     Family History:  The patient's family history includes Cancer in his father; Heart disease in his mother.   ROS:   Please see the history of present illness.    Review of Systems  Cardiovascular: Negative for chest pain and syncope.  Respiratory: Negative for shortness of breath.     Endocrine: Negative for cold intolerance.  Hematologic/Lymphatic: Negative for bleeding problem.   All other systems reviewed and are negative.   EKGs/Labs/Other Test Reviewed:    EKG:  EKG is not ordered today.  The ekg ordered today demonstrates n/a  Recent Labs: 11/08/2015: Hemoglobin 12.4; Platelets 184 03/06/2016: BUN 23; Creat 1.47; Potassium 4.5; Sodium 134 06/16/2016: ALT 14   Recent Lipid Panel    Component Value Date/Time   CHOL 107 (L) 06/16/2016 0833   TRIG 128 06/16/2016 0833   HDL 30 (L) 06/16/2016 0833   CHOLHDL 3.6 06/16/2016 0833   VLDL 26 06/16/2016  [id number]   LDLCALC 51 06/16/2016 0833     Physical Exam:    VS:  BP 140/90 (BP Location: Right Arm, Patient Position: Sitting, Cuff Size: Normal)   Pulse 76   Ht 5\' 8"  (1.727 m)   Wt 207 lb (93.9 kg)   SpO2 95%   BMI 31.47 kg/m     Wt Readings from Last 3 Encounters:  07/18/16 207 lb (93.9 kg)  04/13/16 201 lb 5 oz (91.3 kg)  03/10/16 196 lb 12.8 oz (89.3 kg)     Physical Exam  Constitutional: He is oriented to person, place, and time. He appears well-developed and well-nourished. No distress.  HENT:  Head: Normocephalic and atraumatic.  Eyes: No scleral icterus.  Neck: No JVD present.  Cardiovascular: Normal rate, regular rhythm and normal heart sounds.   No murmur heard. Pulmonary/Chest: Effort normal. He has no wheezes. He has no rales.  Abdominal: Soft. There is no tenderness.  Musculoskeletal: He exhibits no edema.  Neurological: He is alert and oriented to person, place, and time.  Skin: Skin is warm and dry.  Psychiatric: He has a normal mood and affect.    ASSESSMENT:    1. Coronary artery disease involving native coronary artery of native heart without angina pectoris   2. Ischemic cardiomyopathy   3. Pure hypercholesterolemia   4. Type 2 diabetes mellitus with complication, unspecified long term insulin use status ([place])   5. Tobacco abuse    PLAN:    In order of problems listed  above:  1. Coronary Artery Disease: S/p lateral STEMI in 8/16 tx with DES to the D1 and subsequent multivessel PCI with DES to the LAD and RCA after patient refused CABG.   He denies symptoms of angina. At this point, he seems to be adherent to all his medications. He again asked me if he needs to continue his current medications. I again explained to him the importance of all of his medications in the context of someone who has had a myocardial infarction and ischemic cardiomyopathy.  -  Continue ASA, Effient, beta-blocker, statin.   -  I will review with Dr. Burt Knack whether we should continue Effient or change to Plavix.  2. Ischemic Cardiomyopathy: He is actually still taking Imdur.  He does not have any signs of volume excess.  He is NYHA 2.  Continue beta-blocker, hydralazine, nitrates. Last echo in 5/17 with EF 35-40%. He has been unable to remain on ACE inhibitor due to worsening Creatinine.   3. Hyperlipidemia  - His LDL in 10/17 was 51.  Continue statin.   4. Diabetes  -  He is not going to primary care.  He is no longer taking any medication for diabetes.  He is convinced that this is no longer a problem for him.  He notes significant lifestyle changes and improved CBGs at home.  I have commended him on this.  However, I again explained to him the importance of close FU with PCP for management of diabetes.  He is also taking a high dose of Vit D from OTC.  He has had no FU on this as well. I will check a Hgb A1c and a Vit D level.  I have asked him to FU with PCP.   5. Tobacco Abuse -  I have again recommended cessation.    Medication Adjustments/Labs and Tests Ordered: Current medicines are reviewed at length with the patient today.  Concerns regarding medicines are outlined above.  Medication changes, Labs and  Tests ordered today are outlined in the Patient Instructions noted below. Patient Instructions  Medication Instructions:  No changes.  See your medication  list.  Labwork: Today - BMET, Hgb A1c, Vitamin D level  Testing/Procedures: None   Follow-Up: Dr. Sherren Mocha in 6 months.  Any Other Special Instructions Will Be Listed Below (If Applicable). I will check with Dr. Burt Knack about your Effient and let you know if we plan to change it.  Continue the Effient for now until you hear back from [name]. You really need to get back and see your primary care doctor for continued follow up and management of your diabetes.  If you need a refill on your cardiac medications before your next appointment, please call your pharmacy.   Signed, Richardson Dopp, PA-C  07/18/2016 10:41 AM    [facility] [address], [place], [place]  [address] Phone: [phone]; Fax: [phone]

## 2016-07-20 NOTE — Progress Notes (Signed)
Would change to plavix to reduce long-term bleeding risk. thx

## 2016-07-21 ENCOUNTER — Telehealth: Payer: Self-pay | Admitting: Physician Assistant

## 2016-07-21 ENCOUNTER — Telehealth: Payer: Self-pay | Admitting: *Deleted

## 2016-07-21 LAB — VITAMIN D 1,25 DIHYDROXY
Vitamin D 1, 25 (OH)2 Total: 32 pg/mL (ref 18–72)
Vitamin D2 1, 25 (OH)2: <8 pg/mL
Vitamin D3 1, 25 (OH)2: 32 pg/mL

## 2016-07-21 MED ORDER — CLOPIDOGREL BISULFATE 75 MG PO TABS: 75.0000 mg | ORAL_TABLET | Freq: Every day | ORAL | 3 refills | Status: DC

## 2016-07-21 NOTE — Telephone Encounter (Signed)
Please tell Mr. Nicolas Ray that I reviewed his medications with Dr. Sherren Mocha. We want him to  STOP Effient START Plavix 75 mg Once daily We talked about this some at his last visit.  I told him we would call him if we decided to change the medication.   Richardson Dopp, PA-C   07/21/2016 5:03 PM

## 2016-07-21 NOTE — Telephone Encounter (Signed)
Pt notified of lab results and that A1c still elevated. Pt advised needs to est with a PCP for further management of diabetes. I advised pt I will put a referral in for a PCP, pt said no he has a dr he will call and get an appt. I advised pt as well per Brynda Rim. PA and Dr. Burt Knack that we will stop effient and start plavix 75 mg daily. Pt agreeable to plan of care. I did advise pt if he happens to have taken Effient the same day he picks up the Plavix; then he will start the Plavix the next day and stop the Effient. Pt verbalized understanding to instructions.

## 2016-07-31 ENCOUNTER — Ambulatory Visit: Payer: PPO | Admitting: Endocrinology

## 2016-08-07 ENCOUNTER — Ambulatory Visit (INDEPENDENT_AMBULATORY_CARE_PROVIDER_SITE_OTHER): Payer: PPO | Admitting: Endocrinology

## 2016-08-07 ENCOUNTER — Other Ambulatory Visit: Payer: Self-pay | Admitting: Cardiovascular Disease

## 2016-08-07 ENCOUNTER — Encounter: Payer: Self-pay | Admitting: Endocrinology

## 2016-08-07 VITALS — BP 120/80 | HR 74 | Ht 68.0 in | Wt 209.0 lb

## 2016-08-07 DIAGNOSIS — E1165 Type 2 diabetes mellitus with hyperglycemia: Secondary | ICD-10-CM | POA: Diagnosis not present

## 2016-08-07 DIAGNOSIS — E042 Nontoxic multinodular goiter: Secondary | ICD-10-CM

## 2016-08-07 DIAGNOSIS — E038 Other specified hypothyroidism: Secondary | ICD-10-CM

## 2016-08-07 DIAGNOSIS — E063 Autoimmune thyroiditis: Secondary | ICD-10-CM

## 2016-08-07 LAB — TSH: TSH: 10.44 u[IU]/mL — AB (ref 0.35–4.50)

## 2016-08-07 LAB — COMPREHENSIVE METABOLIC PANEL
ALK PHOS: 75 U/L (ref 39–117)
ALT: 15 U/L (ref 0–53)
AST: 15 U/L (ref 0–37)
Albumin: 4.3 g/dL (ref 3.5–5.2)
BUN: 22 mg/dL (ref 6–23)
CO2: 25 meq/L (ref 19–32)
Calcium: 9.5 mg/dL (ref 8.4–10.5)
Chloride: 102 mEq/L (ref 96–112)
Creatinine, Ser: 1.54 mg/dL — ABNORMAL HIGH (ref 0.40–1.50)
GFR: 48.37 mL/min — AB (ref >60.00)
GLUCOSE: 164 mg/dL — AB (ref 70–99)
POTASSIUM: 4 meq/L (ref 3.5–5.1)
SODIUM: 135 meq/L (ref 135–145)
TOTAL PROTEIN: 8 g/dL (ref 6.0–8.3)
Total Bilirubin: 0.8 mg/dL (ref 0.2–1.2)

## 2016-08-07 LAB — T4, FREE: FREE T4: 0.8 ng/dL (ref 0.60–1.60)

## 2016-08-07 MED ORDER — SITAGLIPTIN PHOSPHATE 50 MG PO TABS: 100.0000 mg | ORAL_TABLET | Freq: Every day | ORAL | 3 refills | Status: DC

## 2016-08-07 MED ORDER — GLUCOSE BLOOD VI STRP: ORAL_STRIP | 12 refills | Status: DC

## 2016-08-07 NOTE — Progress Notes (Signed)
Patient ID: Nicolas Ray, male   DOB: February 18, 1951, 65 y.o.   MRN: [id number]            Reason for Appointment: Consultation for Type 2 Diabetes  Referring physician: Thurmon   History of Present Illness:          Date of diagnosis of type 2 diabetes mellitus: 2014      Background history:   At the time of diagnosis A1c was 8.6. However [place] if he had any hyperglycemia then. He says that he started improving his diet subsequently and was able to improve his blood sugar control and lose weight. However his blood sugars have been overall still not well controlled and his A1c has been consistently over 7%. He had been on metformin but he claims it causes swelling of his legs and he stopped it in 2014, unclear how long he took this  Recent history:      Most recent A1c is 7.8 done in 11/17  Non-insulin hypoglycemic drugs the patient is taking are: None  Current management, blood sugar patterns and problems identified:  He had been tried on metformin in 2017 and Onglyza subsequently but he stopped his drugs.  He says metformin causes swelling.  Not clear why he stopped Onglyza, he is reluctant to take any medications.  He thinks his weight had gone down to 190 pounds with strict diet and increasing exercise but has been going up subsequently.  He thinks he has controlled his diabetes with diet and weight loss but is not aware of what an A1c is.  He does not check his blood sugar and has not had a meter for a year.  He says a sporadically will check his blood sugar from his sisters monitor and he thinks it is about 133  Lab glucose last month was 154 in the morning        Side effects from medications have been:?  Edema from metformin  Compliance with the medical regimen: Poor Hypoglycemia:    Glucose monitoring:  done 0 times a day         Glucometer:  none Blood Glucose readings as above  Self-care: The diet that the patient has been  following is: tries to limit sweets and fried food, tries to eat more vegetables .      Typical meal intake: Breakfast is eggs and toast, 6 some of his meals are soups and sadness, usually avoiding snacks               Dietician visit, most recent:?  2015               Exercise:  walking up to 5 days a week  Weight history:  Wt Readings from Last 3 Encounters:  08/07/16 209 lb (94.8 kg)  07/18/16 207 lb (93.9 kg)  04/13/16 201 lb 5 oz (91.3 kg)    Glycemic control:   Lab Results  Component Value Date   HGBA1C 7.8 (H) 07/18/2016   HGBA1C 8.5 (H) 04/18/2015   HGBA1C 7.4 04/18/2014   Lab Results  Component Value Date   MICROALBUR 30.99 (H) 10/13/2013   LDLCALC 51 06/16/2016   CREATININE 1.54 (H) 08/07/2016   Lab Results  Component Value Date   MICRALBCREAT 335.8 (H) 10/13/2013    THYROID disease: See review of systems      Medication List       Accurate as of 08/07/16 11:59 PM. Always use your  most recent med list.          aspirin EC 81 MG tablet Take 81 mg by mouth daily.   atorvastatin 20 MG tablet Commonly known as:  LIPITOR TAKE 1 TABLET(20 MG) BY MOUTH DAILY AT 6 PM   carvedilol 6.25 MG tablet Commonly known as:  COREG TAKE 1 TABLET BY MOUTH TWICE DAILY WITH A MEAL   clopidogrel 75 MG tablet Commonly known as:  PLAVIX Take 1 tablet (75 mg total) by mouth daily.   glucose blood test strip Commonly known as:  ACCU-CHEK ACTIVE STRIPS Test your sugar levels once a day.  Pt needs test strips for the accu-chek guide meter   hydrALAZINE 10 MG tablet Commonly known as:  APRESOLINE Take 1 tablet (10 mg total) by mouth 2 (two) times daily.   isosorbide mononitrate 30 MG 24 hr tablet Commonly known as:  IMDUR Take 15 mg by mouth daily.   levothyroxine 100 MCG tablet Commonly known as:  SYNTHROID, LEVOTHROID TAKE 0.100 MCG TABLET BY MOUTH ONCE EVERYDAY   magnesium 30 MG tablet Take 500 mg by mouth 2 (two) times daily.   phytonadione 5 MG  tablet Commonly known as:  VITAMIN K TAKE 1 CAPSULE BY MOUTH DAILY PATIENT NOT SURE OF DOSAGE   sitaGLIPtin 50 MG tablet Commonly known as:  JANUVIA Take 2 tablets (100 mg total) by mouth daily.   tamsulosin 0.4 MG Caps capsule Commonly known as:  FLOMAX Take 0.4 mg by mouth. Reported on 01/07/2016   VITAMIN D PO Take 5,000 Units by mouth daily.       Allergies: No Known Allergies  Past Medical History:  Diagnosis Date  . CAD (coronary artery disease) 04/18/2015   a. Lat STEMI 8/16 - LHC:  pLAD 90, mLAD 50, D1 100, dLCx 90, OM1 80, pRCA 75, EF 35-45% >> DES to D1;  b. staged PCI 04/20/15 - DES to mid LAD and DES to prox RCA  . Depression   . DM2 (diabetes mellitus, type 2) ([place])   . HTN (hypertension) 02/08/2013   Renal Artery [name] 9/16: Bilateral RAS 1-59%, SMA >70%  . Hypothyroidism   . Ischemic cardiomyopathy    a. Echo 8/16:  Mild LVH, EF 40-45%, mid-apical anterolateral and apical AK, grade 1 diastolic dysfunction, trivial effusion    Past Surgical History:  Procedure Laterality Date  . CARDIAC CATHETERIZATION N/A 04/18/2015   Procedure: Left Heart Cath and Coronary Angiography;  Surgeon: Sherren Mocha, MD;  Location: [facility];  Service: Cardiovascular;  Laterality: N/A;  . CARDIAC CATHETERIZATION N/A 04/18/2015   Procedure: Coronary Stent Intervention;  Surgeon: Sherren Mocha, MD;  Location: [facility];  Service: Cardiovascular;  Laterality: N/A;  . CARDIAC CATHETERIZATION N/A 04/20/2015   Procedure: Coronary Stent Intervention;  Surgeon: Burnell Blanks, MD;  [facility];  Service: Cardiovascular;  Laterality: N/A;  . CORONARY STENT PLACEMENT    . no prior surgery      Family History  Problem Relation Age of Onset  . Heart disease Mother   . Cancer Father   . Colon cancer Neg Hx     Social History:  reports that he has been smoking Cigarettes.  He has been smoking about 0.25 packs per day. He has never used smokeless  tobacco. He reports that he drinks alcohol. He reports that he does not use drugs.   Review of Systems  Constitutional: Positive for weight gain. Negative for reduced appetite.  HENT: Negative  for headaches.   Eyes: Negative for visual disturbance.  Respiratory: Negative for shortness of breath.   Cardiovascular: Negative for chest pain, palpitations and leg swelling.  Gastrointestinal: Negative for diarrhea and abdominal pain.  Endocrine: Negative for fatigue and general weakness.  Genitourinary: Positive for nocturia.  Musculoskeletal: Negative for joint pain.  Skin: Negative for rash.  Neurological: Negative for numbness, tingling and balance difficulty.     Lipid history: On treatment with 20 mg Lipitor, has history of CAD    Lab Results  Component Value Date   CHOL 107 (L) 06/16/2016   HDL 30 (L) 06/16/2016   LDLCALC 51 06/16/2016   TRIG 128 06/16/2016   CHOLHDL 3.6 06/16/2016           Hypertension:Treated with carvedilol low dose and hydralazine for history of CHF  Most recent eye exam was ?  10/17, report not available  Most recent foot exam: 12/17  He apparently has been on thyroid supplement since about 2014. Initially was given 25 g This has been progressively increased as TSH has been mostly high Unclear which physician has been following him for this. His levothyroxine dose was last increased in 4/17 from 88 up to 100 g when TSH was 19.29, presumably by his PCP He does not think he feels unusually tired.  He does not think he has been told to have a goiter in the past   Lab Results  Component Value Date   TSH 10.44 (H) 08/07/2016   TSH 25.863 (H) 04/18/2015   TSH 24.455 (H) 10/13/2013   FREET4 0.80 08/07/2016   FREET4 0.66 (L) 10/13/2013   FREET4 0.71 (L) 06/15/2013      LABS:  Office Visit on 08/07/2016  Component Date Value Ref Range Status  . TSH 08/07/2016 10.44* 0.35 - 4.50 uIU/mL Final  . Free T4 08/07/2016 0.80  0.60 - 1.60 ng/dL  Final   Comment: Specimens from patients who are undergoing biotin therapy and /or ingesting biotin supplements may contain high levels of biotin.  The higher biotin concentration in these specimens interferes with this Free T4 assay.  Specimens that contain high levels  of biotin may cause false high results for this Free T4 assay.  Please interpret results in light of the total clinical presentation of the patient.    . Sodium 08/07/2016 135  135 - 145 mEq/L Final  . Potassium 08/07/2016 4.0  3.5 - 5.1 mEq/L Final  . Chloride 08/07/2016 102  96 - 112 mEq/L Final  . CO2 08/07/2016 25  19 - 32 mEq/L Final  . Glucose, Bld 08/07/2016 164* 70 - 99 mg/dL Final  . BUN 08/07/2016 22  6 - 23 mg/dL Final  . Creatinine, Ser 08/07/2016 1.54* 0.40 - 1.50 mg/dL Final  . Total Bilirubin 08/07/2016 0.8  0.2 - 1.2 mg/dL Final  . Alkaline Phosphatase 08/07/2016 75  39 - 117 U/L Final  . AST 08/07/2016 15  0 - 37 U/L Final  . ALT 08/07/2016 15  0 - 53 U/L Final  . Total Protein 08/07/2016 8.0  6.0 - 8.3 g/dL Final  . Albumin 08/07/2016 4.3  3.5 - 5.2 g/dL Final  . Calcium 08/07/2016 9.5  8.4 - 10.5 mg/dL Final  . GFR 08/07/2016 48.37* >60.00 mL/min Final    Physical Examination:  BP 120/80   Pulse 74   Ht 5\' 8"  (1.727 m)   Wt 209 lb (94.8 kg)   SpO2 98%   BMI 31.78 kg/m   GENERAL:  Patient has generalized obesity.   HEENT:         Eye exam shows normal external appearance. Fundus exam shows no retinopathy. Oral exam shows normal mucosa .  NECK:   There is no lymphadenopathy   Thyroid is enlarged bilaterally He has a larger left lobe, nodular and measuring about 3-3.5 cm Right lobe is nodular with several small nodules and about twice normal size Carotids are normal to palpation and no bruit heard LUNGS:         Chest is symmetrical. Lungs are clear to auscultation.Marland Kitchen   HEART:         Heart sounds:  S1 and S2 are normal. No murmur or click heard., no S3 or S4.   ABDOMEN:   There is no  distention present. Liver and spleen are not palpable. No other mass or tenderness present.   NEUROLOGICAL:   Ankle jerks are absent bilaterally.  biceps reflexes normal   Diabetic Foot Exam - Simple   Simple Foot Form Diabetic Foot exam was performed with the following findings:  Yes   Visual Inspection No deformities, no ulcerations, no other skin breakdown bilaterally:  Yes Sensation Testing Intact to touch and monofilament testing bilaterally:  Yes Pulse Check Posterior Tibialis and Dorsalis pulse intact bilaterally:  Yes Comments            Vibration sense is Mildly reduced in distal first toes. MUSCULOSKELETAL:  There is no swelling or deformity of the peripheral joints. Spine is normal to inspection.   EXTREMITIES:     There is Trace lower leg edema. No skin lesions present.Marland Kitchen SKIN:       No rash or lesions of concern.        ASSESSMENT:  Diabetes type 2, uncontrolled  with persistently high A1c.BMI 32  See history of present illness for detailed discussion of current diabetes management, blood sugar patterns and problems identified  Currently not on any pharmacological treatment, he refuses to consider metformin since he claims it caused leg swelling Currently not checking blood sugars at home, has not had a meter. Although he thinks he has a good lifestyle and is trying to walk for exercise his weight appears to be going back up  He claims that he is doing well enough on his diet and does not need any further treatment  Complications of diabetes:?  Retinopathy, eye exam report not available.  No recent urine microalbumin available  History of coronary artery disease and systolic heart failure followed by cardiologist  Hyperlipidemia, well controlled with Lipitor 20 mg  MULTINODULAR goiter and primary hypothyroidism  He has a multinodular goiter and and this has not been assessed with a thyroid ultrasound Appears to have a significant left lobe enlargement, probably a  large nodule  His hypothyroidism has been inadequately replaced with persistently high TSH levels Has not had a TSH level apparently since his dose was increased up to 100 g in April  PLAN:     Emphasized the need to improve his A1c and discussed goals of A1c of under at least 7% per  Discussed that he has not been able to get his blood sugars adequately controlled with diet and exercise alone and needs pharmacological treatment   He will start Januvia.  This would be generally free of side effects, he is reluctant to try any medication that can potentially give side effects.  Since his renal function is borderline he was started with 50 mg daily.  Discussed how this works  and given him patient information on this.  He was given an Accu-Chek guide monitor today, showed him how to use this, discussed timing of glucose monitoring, blood sugar targets and need to bring it for every visit to enable download and blood sugar evaluation  For his goiter he needs to have an ultrasound to establish the diagnosis and rule out any suspicious nodules.  Discussed that there is a 5% chance of malignant nodule and needs to have this evaluated. Repeat thyroid functions as his TSH has not been checked since his dosage was increased  Patient Instructions  Check blood sugars on waking up  2-3x per week  Also check blood sugars about 2 hours after a meal and do this after different meals by rotation  Recommended blood sugar levels on waking up is 90-130 and about 2 hours after meal is 130-160  Please bring your blood sugar monitor to each visit, thank you  Start Junuvia  In am daily for diabetes       Total visit time for evaluation and management of various problems and counseling = 60 minutes  Ascencion Stegner 08/08/2016, 8:27 AM   Note: This office note was prepared with Dragon voice recognition system technology. Any transcriptional errors that result from this process are  unintentional.   Addendum: TSH still high, he will increase levothyroxine to 125 g Creatinine clearance 47, can stay with 50 mg of Januvia

## 2016-08-07 NOTE — Progress Notes (Signed)
Please let patient know that his thyroid level is low indicating he is getting an adequate dose of levothyroxine.  He will increase the dose  to 125 g daily

## 2016-08-07 NOTE — Telephone Encounter (Signed)
Conversation  (Newest Message First)  Michae Kava, [facility]      07/21/16 5:38 PM  Note    Pt notified of lab results and that A1c still elevated. Pt advised needs to est with a PCP for further management of diabetes. I advised pt I will put a referral in for a PCP, pt said no he has a dr he will call and get an appt. I advised pt as well per Brynda Rim. PA and Dr. Burt Knack that we will stop effient and start plavix 75 mg daily. Pt agreeable to plan of care. I did advise pt if he happens to have taken Effient the same day he picks up the Plavix; then he will start the Plavix the next day and stop the Effient. Pt verbalized understanding to instructions.

## 2016-08-07 NOTE — Patient Instructions (Addendum)
Check blood sugars on waking up  2-3x per week  Also check blood sugars about 2 hours after a meal and do this after different meals by rotation  Recommended blood sugar levels on waking up is 90-130 and about 2 hours after meal is 130-160  Please bring your blood sugar monitor to each visit, thank you  Start Junuvia  In am daily for diabetes

## 2016-08-08 ENCOUNTER — Encounter: Payer: Self-pay | Admitting: Endocrinology

## 2016-08-08 ENCOUNTER — Other Ambulatory Visit: Payer: Self-pay

## 2016-08-08 MED ORDER — LEVOTHYROXINE SODIUM 125 MCG PO TABS: 125.0000 ug | ORAL_TABLET | Freq: Every day | ORAL | 3 refills | Status: DC

## 2016-08-12 ENCOUNTER — Telehealth: Payer: Self-pay | Admitting: Cardiovascular Disease

## 2016-08-12 NOTE — Telephone Encounter (Signed)
Left message on machine for pt to contact the office.   

## 2016-08-12 NOTE — Telephone Encounter (Signed)
New Message  Pt c/o medication issue:  1. Name of Medication: hydralazine (apresoline) 10 mg take twice daily totaling 10 mg  2. How are you currently taking this medication (dosage and times per day)? See above  3. Are you having a reaction (difficulty breathing--STAT)? No  4. What is your medication issue? Pt voiced this medication is making him shiver and needing to speak with nurse about it.  Pt voiced he was testing himself and voiced this is ongoing and he finally figured out which medication it is.  Pt voiced he's been without and nothing's happened.  Please f/u with pt

## 2016-08-13 NOTE — Telephone Encounter (Signed)
I spoke with the pt and he continues to experiment with his medications due to multiple side effects.  The pt has been experiencing chills and he has narrowed this symptom down to hydralazine.  The pt no longer wants to take this medication and would like Dr Burt Knack to prescribe a new medication to take the place of hydralazine.  I will forward this information to Dr Burt Knack to review.

## 2016-08-13 NOTE — Telephone Encounter (Signed)
Returned call   hydrALAZINE (APRESOLINE) 10 MG tablet    Is given him the chills. Would like to know if he needs to keep taking the medication since he is also taking aspirin.

## 2016-08-14 ENCOUNTER — Ambulatory Visit
Admission: RE | Admit: 2016-08-14 | Discharge: 2016-08-14 | Disposition: A | Discharge status: Home / Self Care | Payer: PPO | Source: Ambulatory Visit | Attending: Endocrinology | Admitting: Endocrinology

## 2016-08-14 DIAGNOSIS — E042 Nontoxic multinodular goiter: Secondary | ICD-10-CM

## 2016-08-14 DIAGNOSIS — E049 Nontoxic goiter, unspecified: Secondary | ICD-10-CM | POA: Diagnosis not present

## 2016-08-14 NOTE — Telephone Encounter (Signed)
He should come in for a visit either with me or an APP to review his medications and come up with a plan. He has discontinued multiple medications and it's not clear to me what should be tried next unless he comes in for review.

## 2016-08-15 NOTE — Telephone Encounter (Signed)
I spoke with the pt and scheduled him to see Dr Burt Knack on 08/21/16 at 2:15 to discuss his medications.

## 2016-08-18 NOTE — Progress Notes (Signed)
Please let patient know that the thyroid ultrasound shows enlargement overall but no nodule formation that would need any further evaluation

## 2016-08-21 ENCOUNTER — Encounter (INDEPENDENT_AMBULATORY_CARE_PROVIDER_SITE_OTHER): Payer: Self-pay

## 2016-08-21 ENCOUNTER — Encounter: Payer: Self-pay | Admitting: Cardiovascular Disease

## 2016-08-21 ENCOUNTER — Ambulatory Visit (INDEPENDENT_AMBULATORY_CARE_PROVIDER_SITE_OTHER): Payer: PPO | Admitting: Cardiovascular Disease

## 2016-08-21 VITALS — BP 150/90 | HR 73 | Ht 68.0 in | Wt 207.0 lb

## 2016-08-21 DIAGNOSIS — I2510 Atherosclerotic heart disease of native coronary artery without angina pectoris: Secondary | ICD-10-CM

## 2016-08-21 DIAGNOSIS — I10 Essential (primary) hypertension: Secondary | ICD-10-CM

## 2016-08-21 MED ORDER — CARVEDILOL 12.5 MG PO TABS: 12.5000 mg | ORAL_TABLET | Freq: Two times a day (BID) | ORAL | 3 refills | Status: DC

## 2016-08-21 NOTE — Patient Instructions (Signed)
Medication Instructions:  INCREASE CARVEDILOL (COREG) TO 12.5 MG TWICE A DAY.  Labwork: NONE  Testing/Procedures: NONE  Follow-Up: Your physician wants you to follow-up in: [place]. You will receive a reminder letter in the mail two months in advance. If you don't receive a letter, please call our office to schedule the follow-up appointment.   If you need a refill on your cardiac medications before your next appointment, please call your pharmacy.

## 2016-08-21 NOTE — Progress Notes (Signed)
Cardiology Office Note Date:  08/21/2016   ID:  Nicolas Ray, DOB 1951/02/04, MRN [id number]  PCP:  Anthoney Harada, MD  Cardiologist:  Sherren Mocha, MD    Chief Complaint  Patient presents with  . Discuss Medications     History of Present Illness: Nicolas Ray is a 65 y.o. male who presents for Follow-up evaluation. The patient has a history of coronary artery disease and subsequent ischemic cardiomyopathy. He also has diabetes, hypertension, and a long history of nonadherence to medical therapy. He initially presented in 2016 with a lateral STEMI treated with primary PCI using a drug-eluting stent to diagonal. He underwent staged PCI with multivessel stenting during his index hospitalization. His LVEF has ranged 35-40%. He's had multiple medication intolerances. He has stopped medication several times in the past on his own.  The patient is here alone today. He denies chest pain, shortness of breath, or leg swelling. States that he cannot tolerate hydralazine because of chills. He asks a lot of questions about his medicines.  Past Medical History:  Diagnosis Date  . CAD (coronary artery disease) 04/18/2015   a. Lat STEMI 8/16 - LHC:  pLAD 90, mLAD 50, D1 100, dLCx 90, OM1 80, pRCA 75, EF 35-45% >> DES to D1;  b. staged PCI 04/20/15 - DES to mid LAD and DES to prox RCA  . Depression   . DM2 (diabetes mellitus, type 2) ([place])   . HTN (hypertension) 02/08/2013   Renal Artery [name] 9/16: Bilateral RAS 1-59%, SMA >70%  . Hypothyroidism   . Ischemic cardiomyopathy    a. Echo 8/16:  Mild LVH, EF 40-45%, mid-apical anterolateral and apical AK, grade 1 diastolic dysfunction, trivial effusion    Past Surgical History:  Procedure Laterality Date  . CARDIAC CATHETERIZATION N/A 04/18/2015   Procedure: Left Heart Cath and Coronary Angiography;  Surgeon: Sherren Mocha, MD;  Location: [facility];  Service: Cardiovascular;  Laterality: N/A;  . CARDIAC CATHETERIZATION N/A 04/18/2015   Procedure: Coronary Stent Intervention;  Surgeon: Sherren Mocha, MD;  Location: [facility];  Service: Cardiovascular;  Laterality: N/A;  . CARDIAC CATHETERIZATION N/A 04/20/2015   Procedure: Coronary Stent Intervention;  Surgeon: Burnell Blanks, MD;  Location: [facility];  Service: Cardiovascular;  Laterality: N/A;  . CORONARY STENT PLACEMENT    . no prior surgery      Current Outpatient Prescriptions  Medication Sig Dispense Refill  . aspirin EC 81 MG tablet Take 81 mg by mouth daily.    Marland Kitchen atorvastatin (LIPITOR) 20 MG tablet TAKE 1 TABLET(20 MG) BY MOUTH DAILY AT 6 PM 30 tablet 10  . Cholecalciferol (VITAMIN D PO) Take 5,000 Units by mouth daily.     . clopidogrel (PLAVIX) 75 MG tablet Take 1 tablet (75 mg total) by mouth daily. 90 tablet 3  . glucose blood (ACCU-CHEK ACTIVE STRIPS) test strip Test your sugar levels once a day.  Pt needs test strips for the accu-chek guide meter 100 each 12  . isosorbide mononitrate (IMDUR) 30 MG 24 hr tablet Take 15 mg by mouth daily.    Marland Kitchen levothyroxine (SYNTHROID, LEVOTHROID) 125 MCG tablet Take 1 tablet (125 mcg total) by mouth daily. 90 tablet 3  . magnesium 30 MG tablet Take 500 mg by mouth 2 (two) times daily.    . phytonadione (VITAMIN K) 5 MG tablet TAKE 1 CAPSULE BY MOUTH DAILY PATIENT NOT SURE OF DOSAGE    . sitaGLIPtin (JANUVIA) 50 MG tablet Take 2  tablets (100 mg total) by mouth daily. 30 tablet 3  . carvedilol (COREG) 12.5 MG tablet Take 1 tablet (12.5 mg total) by mouth 2 (two) times daily. 180 tablet 3   No current facility-administered medications for this visit.     Allergies:   Patient has no known allergies.   Social History:  The patient  reports that he has been smoking Cigarettes.  He has been smoking about 0.25 packs per day. He has never used smokeless tobacco. He reports that he drinks alcohol. He reports that he does not use drugs.   Family History:  The patient's  family history includes Cancer in his  father; Heart disease in his mother; Thyroid disease in his sister.    ROS:  Please see the history of present illness.  All other systems are reviewed and negative.    PHYSICAL EXAM: VS:  BP (!) 150/90   Pulse 73   Ht 5\' 8"  (1.727 m)   Wt 207 lb (93.9 kg)   BMI 31.47 kg/m  , BMI Body mass index is 31.47 kg/m. GEN: Well nourished, well developed, in no acute distress  HEENT: normal  Neck: no JVD, no masses. No carotid bruits Cardiac: RRR without murmur or gallop                Respiratory:  clear to auscultation bilaterally, normal work of breathing GI: soft, nontender, nondistended, + BS MS: no deformity or atrophy  Ext: no pretibial edema, pedal pulses 2+= bilaterally Skin: warm and dry, no rash Neuro:  Strength and sensation are intact Psych: euthymic mood, full affect  EKG:  EKG is ordered today. The ekg ordered today shows normal sinus rhythm 70 bpm, anterolateral infarct age undetermined  Recent Labs: 11/08/2015: Hemoglobin 12.4; Platelets 184 08/07/2016: ALT 15; BUN 22; Creatinine, Ser 1.54; Potassium 4.0; Sodium 135; TSH 10.44   Lipid Panel     Component Value Date/Time   CHOL 107 (L) 06/16/2016 0833   TRIG 128 06/16/2016 0833   HDL 30 (L) 06/16/2016 0833   CHOLHDL 3.6 06/16/2016 0833   VLDL 26 06/16/2016 0833   LDLCALC 51 06/16/2016 0833      Wt Readings from Last 3 Encounters:  08/21/16 207 lb (93.9 kg)  08/07/16 209 lb (94.8 kg)  07/18/16 207 lb (93.9 kg)     Cardiac Studies Reviewed: Echo 12/31/2015: Study Conclusions  - Left ventricle: The cavity size was moderately dilated. Wall   thickness was increased in a pattern of moderate LVH. Systolic   function was moderately reduced. The estimated ejection fraction   was in the range of 35% to 40%. Akinesis of the apical   myocardium. Doppler parameters are consistent with abnormal left   ventricular relaxation (grade 1 diastolic dysfunction). - Pericardium, extracardiac: A trivial pericardial effusion  was   identified.  ASSESSMENT AND PLAN: 1.  CAD, native vessel: Patient with history of lateral wall STEMI and multivessel CAD.  2. Type 2 diabetes: notes of Dr Dwyane Dee reviewed.   3. Hyperlipidemia: Treated with atorvastatin 20 mg daily. Most recent lipids reviewed.  4. Ischemic cardiomyopathy: Patient is unable to tolerate an ACE or ARB. He has no evidence of volume excess on exam.  5. Hypertension with chronic kidney disease and congestive heart failure: Blood pressure is uncontrolled. Will increase carvedilol to 12.5 mg twice daily.  Overall the patient is clinically stable. He continues to have very poor insight into his chronic medical problems such as hypertension and diabetes. This makes his  care somewhat difficult but he seems agreeable to increasing his carvedilol dose today.  Current medicines are reviewed with the patient today.  The patient does not have concerns regarding medicines.  Labs/ tests ordered today include:   Orders Placed This Encounter  Procedures  . EKG 12-Lead    Disposition:   FU 6 months  Signed, Sherren Mocha, MD  08/21/2016 5:33 PM    [address], [place], [place]  [address] Phone: [phone]; Fax: [phone]

## 2016-09-10 IMAGING — CT CT RENAL STONE PROTOCOL
2 of 4 series · 9 of 46 positions shown, 10 images · non-contrast
Comparison: None.

CLINICAL DATA: Right-sided flank pain for 2 days

EXAM:
CT ABDOMEN AND PELVIS WITHOUT CONTRAST
TECHNIQUE: Multidetector CT imaging of the abdomen and pelvis was performed
following the standard protocol without IV contrast.

[Series 201: stone study, idose (2) · axial · 0.91mm/px · z∈[+9,+399]mm · 6 of 96 slices shown, 7 images]
[im 9/96  soft-tissue]
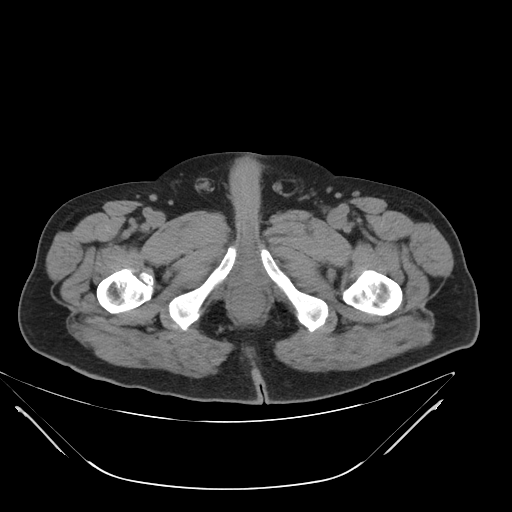
[im 9/96  bone]
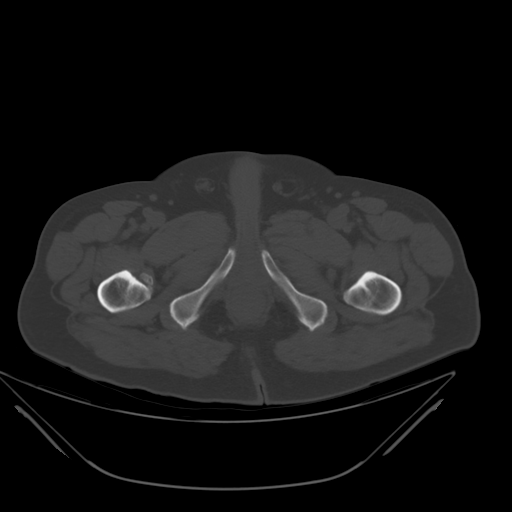
[im 25/96  soft-tissue]
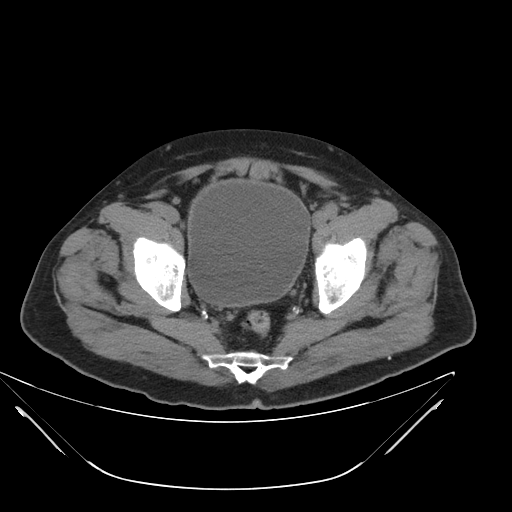
[im 42/96  soft-tissue]
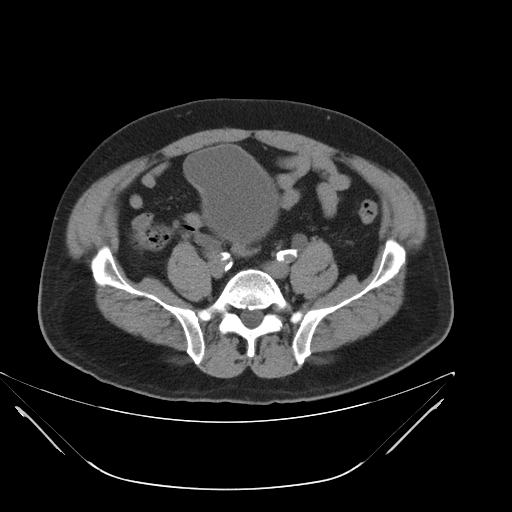
[im 54/96  soft-tissue]
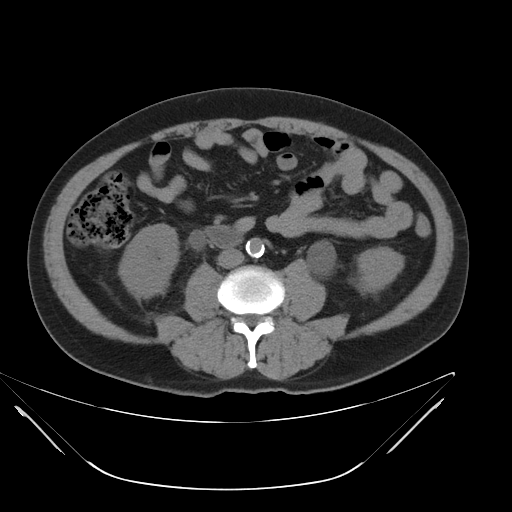
[im 71/96  soft-tissue]
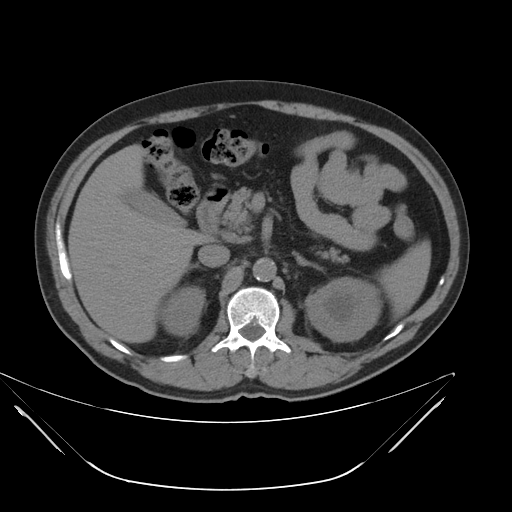
[im 87/96  soft-tissue]
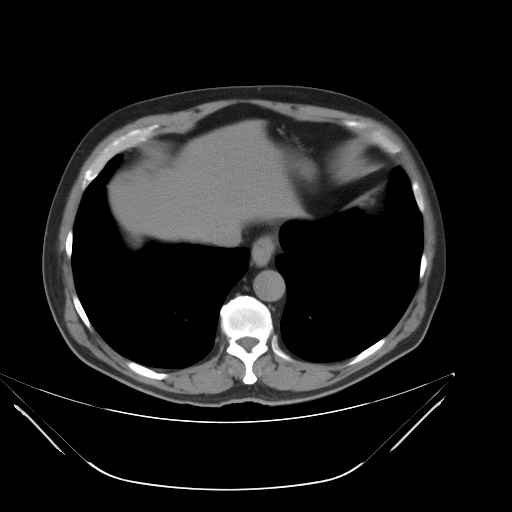

[Series 203: coronals, idose (2) · coronal · 0.45mm/px · 3 of 160 slices shown]
[im 54/160  soft-tissue]
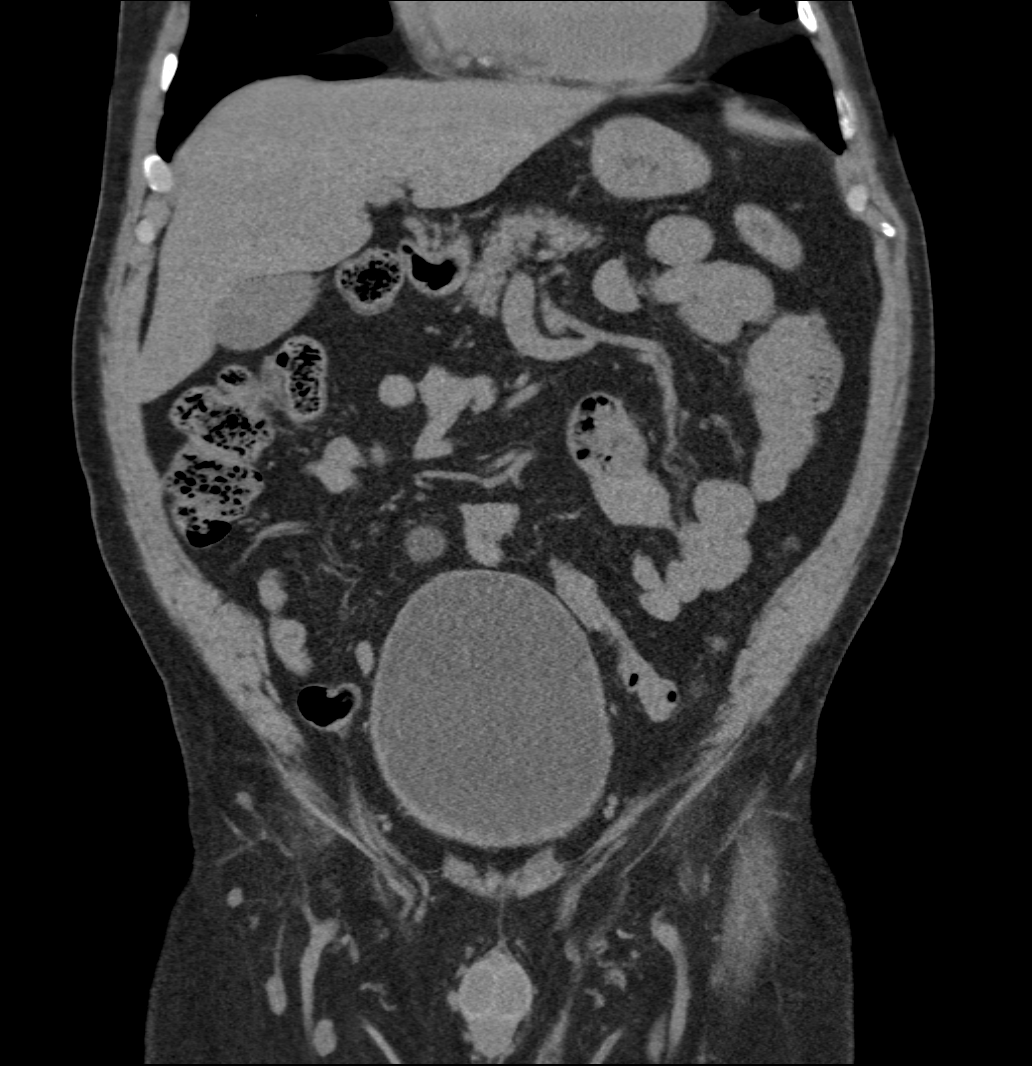
[im 71/160  soft-tissue]
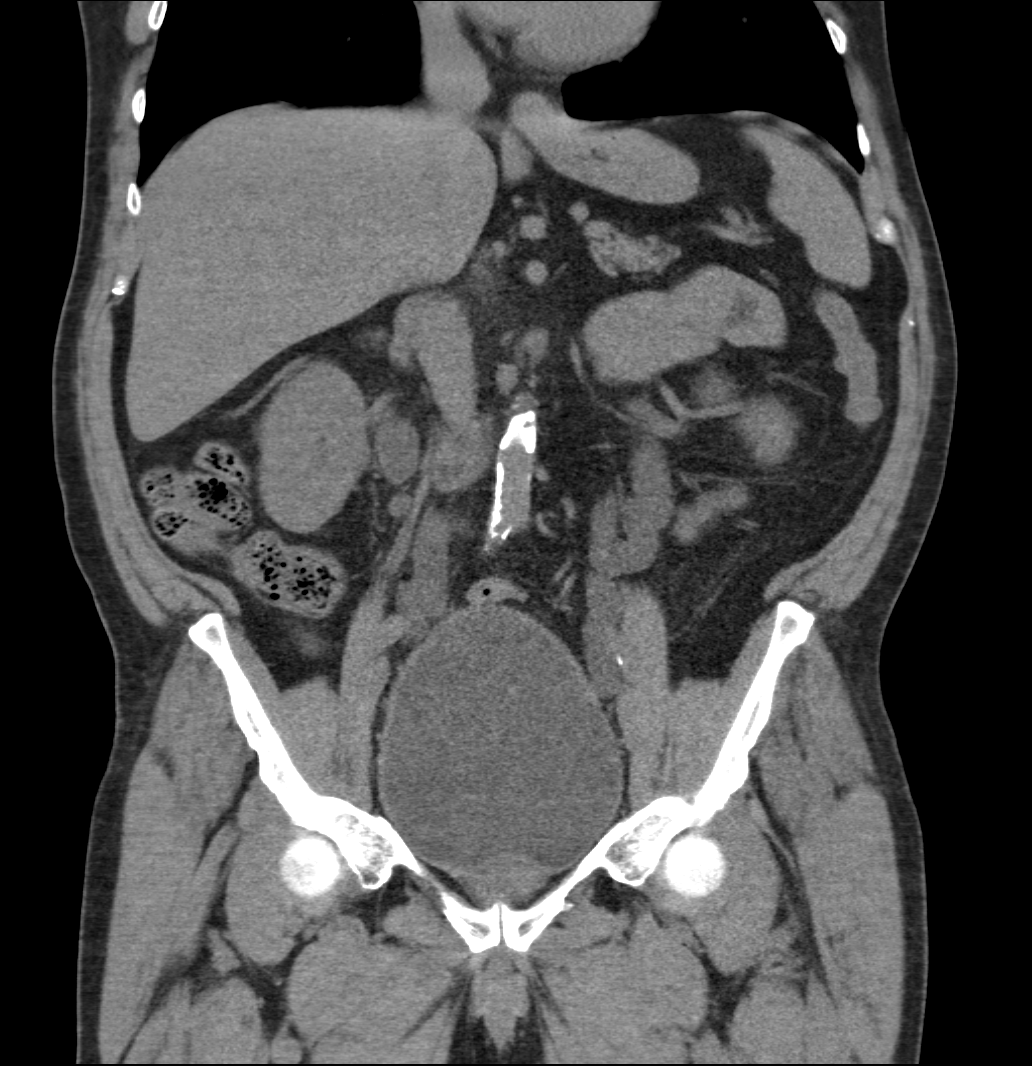
[im 89/160  soft-tissue]
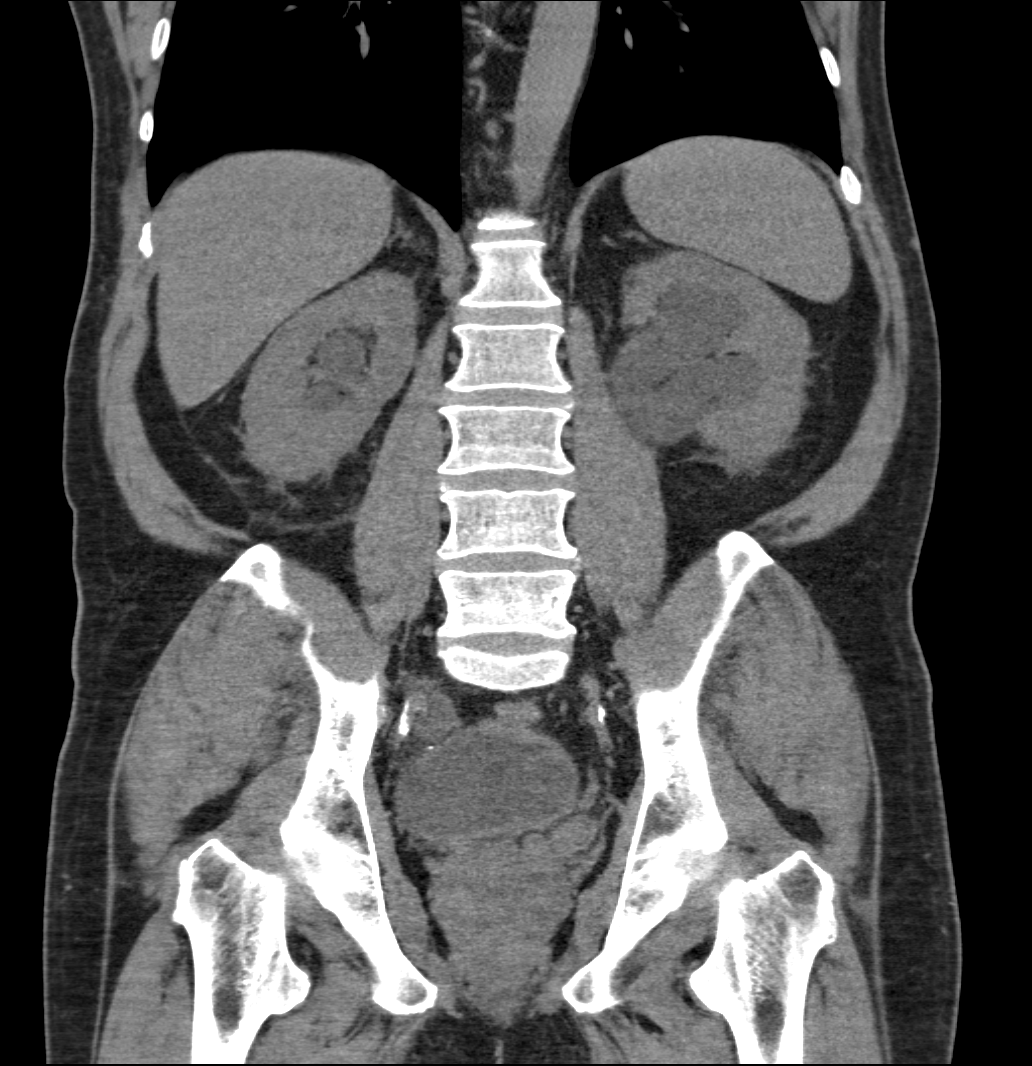

[9 of 46 positions shown; findings below may reference images not displayed]

FINDINGS: Lung bases are well aerated with minimal scarring in the lingula.

The liver, gallbladder, spleen, adrenal glands and pancreas are
within normal limits.

Kidneys are well visualized bilaterally and demonstrate bilateral
cystic change. Additionally there is significant hydronephrosis and
hydroureter identified bilaterally. This extends to the level of the
bladder without evidence of focal calculus. The left ureter inserts
somewhat laterally on the bladder. The right ureter appears to
insert posteriorly. The bladder is well distended. The prostate is
mildly enlarged. No pelvic mass lesion or sidewall abnormality is
noted.

The appendix is within normal limits. Mild aortoiliac calcifications
are seen. No osseous abnormality is noted.
IMPRESSION: Bilateral severe hydronephrosis and hydroureter without evidence of
obstructing lesion. The hydronephrosis may be related to the
insertions of the ureters distally.

No renal calculi are identified.

No other focal abnormality is seen.

## 2016-09-14 ENCOUNTER — Other Ambulatory Visit: Payer: Self-pay | Admitting: Endocrinology

## 2016-09-14 DIAGNOSIS — E1165 Type 2 diabetes mellitus with hyperglycemia: Secondary | ICD-10-CM

## 2016-09-14 DIAGNOSIS — E063 Autoimmune thyroiditis: Secondary | ICD-10-CM

## 2016-09-16 ENCOUNTER — Other Ambulatory Visit (INDEPENDENT_AMBULATORY_CARE_PROVIDER_SITE_OTHER): Payer: PPO

## 2016-09-16 ENCOUNTER — Telehealth: Payer: Self-pay

## 2016-09-16 ENCOUNTER — Other Ambulatory Visit: Payer: Self-pay

## 2016-09-16 DIAGNOSIS — E1165 Type 2 diabetes mellitus with hyperglycemia: Secondary | ICD-10-CM

## 2016-09-16 DIAGNOSIS — E038 Other specified hypothyroidism: Secondary | ICD-10-CM | POA: Diagnosis not present

## 2016-09-16 DIAGNOSIS — E063 Autoimmune thyroiditis: Secondary | ICD-10-CM

## 2016-09-16 LAB — BASIC METABOLIC PANEL
BUN: 22 mg/dL (ref 6–23)
CALCIUM: 9.2 mg/dL (ref 8.4–10.5)
CHLORIDE: 102 meq/L (ref 96–112)
CO2: 27 meq/L (ref 19–32)
Creatinine, Ser: 1.47 mg/dL (ref 0.40–1.50)
GFR: 51.03 mL/min — ABNORMAL LOW (ref >60.00)
Glucose, Bld: 112 mg/dL — ABNORMAL HIGH (ref 70–99)
Potassium: 4.2 mEq/L (ref 3.5–5.1)
Sodium: 136 mEq/L (ref 135–145)

## 2016-09-16 LAB — T4, FREE: FREE T4: 0.69 ng/dL (ref 0.60–1.60)

## 2016-09-16 LAB — TSH: TSH: 18.2 u[IU]/mL — AB (ref 0.35–4.50)

## 2016-09-16 NOTE — Telephone Encounter (Signed)
Patient stating that the Celesta Gentile is giving him shoulder pain and wants to know if he should stop taking it please advise

## 2016-09-16 NOTE — Telephone Encounter (Signed)
Januvia does not cause shoulder pain.  He needs to continue this

## 2016-09-17 LAB — FRUCTOSAMINE: Fructosamine: 307 umol/L — ABNORMAL HIGH (ref 0–285)

## 2016-09-19 ENCOUNTER — Ambulatory Visit (INDEPENDENT_AMBULATORY_CARE_PROVIDER_SITE_OTHER): Payer: PPO | Admitting: Endocrinology

## 2016-09-19 ENCOUNTER — Encounter: Payer: Self-pay | Admitting: Endocrinology

## 2016-09-19 VITALS — BP 142/80 | HR 78 | Ht 68.0 in | Wt 207.0 lb

## 2016-09-19 DIAGNOSIS — E038 Other specified hypothyroidism: Secondary | ICD-10-CM

## 2016-09-19 DIAGNOSIS — E1165 Type 2 diabetes mellitus with hyperglycemia: Secondary | ICD-10-CM

## 2016-09-19 DIAGNOSIS — E063 Autoimmune thyroiditis: Secondary | ICD-10-CM

## 2016-09-19 MED ORDER — LINAGLIPTIN 5 MG PO TABS: 5.0000 mg | ORAL_TABLET | Freq: Every day | ORAL | 1 refills | Status: DC

## 2016-09-19 NOTE — Patient Instructions (Addendum)
Check blood sugars on waking up  2-3x per week  Also check blood sugars about 2 hours after a meal and do this after different meals by rotation  Recommended blood sugar levels on waking up is 90-130 and about 2 hours after meal is 130-160  Please bring your blood sugar monitor to each visit, thank you  LEVOTHYROXINE 125 UG DAILY

## 2016-09-19 NOTE — Progress Notes (Signed)
Patient ID: Nicolas Ray, male   DOB: [birth year], 66 y.o.   MRN: [id number]            Reason for Appointment: Follow-up for diabetes and thyroid  Referring physician: [place]   History of Present Illness:          Date of diagnosis of type 2 diabetes mellitus: 2014      Background history:   At the time of diagnosis A1c was 8.6. However [place] if he had any hyperglycemia then. He says that he started improving his diet subsequently and was able to improve his blood sugar control and lose weight. However his blood sugars have been overall still not well controlled and his A1c has been consistently over 7%. He had been on metformin but he claims it causes swelling of his legs and he stopped it in 2014, unclear how long he took this  Recent history:      Most recent A1c is 7.8 done in 11/17 Recent fructosamine is 307  Non-insulin hypoglycemic drugs the patient is taking are: Januvia 50 mg daily  Current management, blood sugar patterns and problems identified:  He now says that he gets left shoulder pain when he takes Januvia and he is sure that the medication causes this as it goes away when he stopped the medication and comes back when he tries it again.  Does not have any other joint pains  He was told to start checking his blood sugar in the last visit and he has not done so  His lab glucose was 112 fasting, improved  He has refused to see the dietitian as he thinks he has had previous instructions  He does not understand the need for medications as he thinks he can control his diabetes with diet alone  Side effects from medications have been:?  Edema from metformin  Compliance with the medical regimen: Poor Hypoglycemia:    Glucose monitoring:  done 0 times a day         Glucometer:  Accu-Chek Blood Glucose readings none  Self-care: The diet that the patient has been following is: tries to limit sweets and fried food, tries to eat  more vegetables .      Typical meal intake: Breakfast is eggs and toast, 6 some of his meals are soups and sadness, usually avoiding snacks               Dietician visit, most recent:?  2015               Exercise:  walking up to 5 days a week  Weight history:  Wt Readings from Last 3 Encounters:  09/19/16 207 lb (93.9 kg)  08/21/16 207 lb (93.9 kg)  08/07/16 209 lb (94.8 kg)    Glycemic control:   Lab Results  Component Value Date   HGBA1C 7.8 (H) 07/18/2016   HGBA1C 8.5 (H) 04/18/2015   HGBA1C 7.4 04/18/2014   Lab Results  Component Value Date   MICROALBUR 30.99 (H) 10/13/2013   LDLCALC 51 06/16/2016   CREATININE 1.47 09/16/2016   Lab Results  Component Value Date   MICRALBCREAT 335.8 (H) 10/13/2013    Lab Results  Component Value Date   FRUCTOSAMINE 307 (H) 09/16/2016    THYROID disease: See review of systems    Allergies as of 09/19/2016   No Known Allergies     Medication List       Accurate as of 09/19/16  12:04 PM. Always use your most recent med list.          aspirin EC 81 MG tablet Take 81 mg by mouth daily.   atorvastatin 20 MG tablet Commonly known as:  LIPITOR TAKE 1 TABLET(20 MG) BY MOUTH DAILY AT 6 PM   carvedilol 12.5 MG tablet Commonly known as:  COREG Take 1 tablet (12.5 mg total) by mouth 2 (two) times daily.   clopidogrel 75 MG tablet Commonly known as:  PLAVIX Take 1 tablet (75 mg total) by mouth daily.   glucose blood test strip Commonly known as:  ACCU-CHEK ACTIVE STRIPS Test your sugar levels once a day.  Pt needs test strips for the accu-chek guide meter   isosorbide mononitrate 30 MG 24 hr tablet Commonly known as:  IMDUR Take 15 mg by mouth daily.   levothyroxine 125 MCG tablet Commonly known as:  SYNTHROID, LEVOTHROID Take 1 tablet (125 mcg total) by mouth daily.   linagliptin 5 MG Tabs tablet Commonly known as:  TRADJENTA Take 1 tablet (5 mg total) by mouth daily.   magnesium 30 MG tablet Take 500 mg by  mouth 2 (two) times daily.   phytonadione 5 MG tablet Commonly known as:  VITAMIN K TAKE 1 CAPSULE BY MOUTH DAILY PATIENT NOT SURE OF DOSAGE   VITAMIN D PO Take 5,000 Units by mouth daily.       Allergies: No Known Allergies  Past Medical History:  Diagnosis Date  . CAD (coronary artery disease) 04/18/2015   a. Lat STEMI 8/16 - LHC:  pLAD 90, mLAD 50, D1 100, dLCx 90, OM1 80, pRCA 75, EF 35-45% >> DES to D1;  b. staged PCI 04/20/15 - DES to mid LAD and DES to prox RCA  . Depression   . DM2 (diabetes mellitus, type 2) ([place])   . HTN (hypertension) 02/08/2013   Renal Artery [name] 9/16: Bilateral RAS 1-59%, SMA >70%  . Hypothyroidism   . Ischemic cardiomyopathy    a. Echo 8/16:  Mild LVH, EF 40-45%, mid-apical anterolateral and apical AK, grade 1 diastolic dysfunction, trivial effusion    Past Surgical History:  Procedure Laterality Date  . CARDIAC CATHETERIZATION N/A 04/18/2015   Procedure: Left Heart Cath and Coronary Angiography;  Surgeon: Sherren Mocha, MD;  Location: [facility];  Service: Cardiovascular;  Laterality: N/A;  . CARDIAC CATHETERIZATION N/A 04/18/2015   Procedure: Coronary Stent Intervention;  Surgeon: Sherren Mocha, MD;  Location: [facility];  Service: Cardiovascular;  Laterality: N/A;  . CARDIAC CATHETERIZATION N/A 04/20/2015   Procedure: Coronary Stent Intervention;  Surgeon: Burnell Blanks, MD;  Location: [facility];  Service: Cardiovascular;  Laterality: N/A;  . CORONARY STENT PLACEMENT    . no prior surgery      Family History  Problem Relation Age of Onset  . Heart disease Mother   . Cancer Father   . Thyroid disease Sister   . Colon cancer Neg Hx   . Diabetes Neg Hx     Social History:  reports that he has been smoking Cigarettes.  He has been smoking about 0.25 packs per day. He has never used smokeless tobacco. He reports that he drinks alcohol. He reports that he does not use drugs.   Review of Systems  Endocrine:  Positive for erectile dysfunction.     Lipid history: On treatment with 20 mg Lipitor, has history of CAD, Followed by PCP    Lab Results  Component Value Date  CHOL 107 (L) 06/16/2016   HDL 30 (L) 06/16/2016   LDLCALC 51 06/16/2016   TRIG 128 06/16/2016   CHOLHDL 3.6 06/16/2016           Hypertension:Treated with carvedilol low dose and hydralazine for history of CHF  Most recent eye exam was ?  10/17, report not available  Most recent foot exam: 12/17  HYPOTHYROIDISM: On levothyroxine since about 2014. This has been progressively increased as TSH has been mostly high  He was told to increase the dose to 125 g on the last visit but he did not understand and is taking 100 g again Again does not think that he feels fatigued His only complaint today is erectile dysfunction  TSH is higher than before   Lab Results  Component Value Date   TSH 18.20 (H) 09/16/2016   TSH 10.44 (H) 08/07/2016   TSH 25.863 (H) 04/18/2015   FREET4 0.69 09/16/2016   FREET4 0.80 08/07/2016   FREET4 0.66 (L) 10/13/2013    GOITER: This was evaluated by ultrasound and he did not have any specific nodule, only has a questionable nodule on the left thyroid. Has diffuse heterogenous enlargement  Previous exam showed the following: He has a larger left lobe, nodular and measuring about 3-3.5 cm Right lobe is nodular with several small nodules and about twice normal size    LABS:  Lab on 09/16/2016  Component Date Value Ref Range Status  . Sodium 09/16/2016 136  135 - 145 mEq/L Final  . Potassium 09/16/2016 4.2  3.5 - 5.1 mEq/L Final  . Chloride 09/16/2016 102  96 - 112 mEq/L Final  . CO2 09/16/2016 27  19 - 32 mEq/L Final  . Glucose, Bld 09/16/2016 112* 70 - 99 mg/dL Final  . BUN 09/16/2016 22  6 - 23 mg/dL Final  . Creatinine, Ser 09/16/2016 1.47  0.40 - 1.50 mg/dL Final  . Calcium 09/16/2016 9.2  8.4 - 10.5 mg/dL Final  . GFR 09/16/2016 51.03* >60.00 mL/min Final  . Fructosamine  09/16/2016 307* 0 - 285 umol/L Final   Comment: Published reference interval for apparently healthy subjects between age 27 and 23 is 66 - 285 umol/L and in a poorly controlled diabetic population is 228 - 563 umol/L with a mean of 396 umol/L.   Marland Kitchen TSH 09/16/2016 18.20* 0.35 - 4.50 uIU/mL Final  . Free T4 09/16/2016 0.69  0.60 - 1.60 ng/dL Final   Comment: Specimens from patients who are undergoing biotin therapy and /or ingesting biotin supplements may contain high levels of biotin.  The higher biotin concentration in these specimens interferes with this Free T4 assay.  Specimens that contain high levels  of biotin may cause false high results for this Free T4 assay.  Please interpret results in light of the total clinical presentation of the patient.      Physical Examination:  BP (!) 142/80   Pulse 78   Ht 5\' 8"  (1.727 m)   Wt 207 lb (93.9 kg)   SpO2 96%   BMI 31.47 kg/m       .        ASSESSMENT:  Diabetes type 2, uncontrolled  with persistently high A1c.BMI 32  See history of present illness for  discussion of current diabetes management, blood sugar patterns and problems identified  His fructosamine indicates overall high readings He has not taken his Januvia regularly claiming that it is causing left shoulder pain Fasting glucose 112 He has not tried to check his  blood sugar as directed even though he was given a monitor  MULTINODULAR goiter and primary hypothyroidism  He did not increase his dose to the 125 g as directed and his TSH is higher, he claims that he is not having any fatigue  PLAN:    He needs to start checking his blood sugar  Emphasized the need to know what his blood sugars are after meals at different times of the day  Explained to him the normal insulin production and mealtime rise in blood sugar and that he is likely showing some insulin deficiency causing high A1c persistently  He will increase his levothyroxine up to 125 g and follow-up  on the next visit  Recheck A1c on the next visit  Tradjenta 5 mg daily instead of Januvia  Patient Instructions  Check blood sugars on waking up  2-3x per week  Also check blood sugars about 2 hours after a meal and do this after different meals by rotation  Recommended blood sugar levels on waking up is 90-130 and about 2 hours after meal is 130-160  Please bring your blood sugar monitor to each visit, thank you  LEVOTHYROXINE 125 UG DAILY      Crespin Forstrom 09/19/2016, 12:04 PM   Note: This office note was prepared with Estate agent. Any transcriptional errors that result from this process are unintentional.

## 2016-09-19 NOTE — Telephone Encounter (Signed)
Gave the patient the advice on visit

## 2016-09-24 ENCOUNTER — Other Ambulatory Visit: Payer: Self-pay

## 2016-09-24 MED ORDER — LINAGLIPTIN 5 MG PO TABS: 5.0000 mg | ORAL_TABLET | Freq: Every day | ORAL | 1 refills | Status: DC

## 2016-10-13 ENCOUNTER — Other Ambulatory Visit: Payer: Self-pay | Admitting: Family Medicine

## 2016-10-13 MED ORDER — LINAGLIPTIN 5 MG PO TABS: 5.0000 mg | ORAL_TABLET | Freq: Every day | ORAL | 1 refills | Status: DC

## 2016-11-13 ENCOUNTER — Ambulatory Visit: Payer: PPO | Admitting: Internal Medicine

## 2016-11-20 DIAGNOSIS — Z87438 Personal history of other diseases of male genital organs: Secondary | ICD-10-CM | POA: Diagnosis not present

## 2016-11-20 DIAGNOSIS — N529 Male erectile dysfunction, unspecified: Secondary | ICD-10-CM | POA: Diagnosis not present

## 2016-12-18 ENCOUNTER — Ambulatory Visit: Payer: PPO | Admitting: Endocrinology

## 2017-01-02 ENCOUNTER — Other Ambulatory Visit: Payer: Self-pay | Admitting: Physician Assistant

## 2017-01-02 DIAGNOSIS — I2510 Atherosclerotic heart disease of native coronary artery without angina pectoris: Secondary | ICD-10-CM

## 2017-01-05 NOTE — Telephone Encounter (Signed)
Should the patient still be taking this medication? It is not listed on his current med list, looks like he stopped it on his own. Please advise. Thanks, MI

## 2017-01-05 NOTE — Telephone Encounter (Signed)
Rx request for hydralazine denied.  Per notes the pt stopped this medication in December 2017 due to chills.

## 2017-02-27 ENCOUNTER — Encounter (HOSPITAL_COMMUNITY): Payer: Self-pay | Admitting: Emergency Medicine

## 2017-02-27 ENCOUNTER — Emergency Department (HOSPITAL_COMMUNITY)
Admission: EM | Admit: 2017-02-27 | Discharge: 2017-02-27 | Disposition: A | Discharge status: Home / Self Care | Payer: PPO | Attending: Emergency Medicine | Admitting: Emergency Medicine

## 2017-02-27 DIAGNOSIS — I2510 Atherosclerotic heart disease of native coronary artery without angina pectoris: Secondary | ICD-10-CM | POA: Diagnosis not present

## 2017-02-27 DIAGNOSIS — E039 Hypothyroidism, unspecified: Secondary | ICD-10-CM | POA: Insufficient documentation

## 2017-02-27 DIAGNOSIS — I252 Old myocardial infarction: Secondary | ICD-10-CM | POA: Insufficient documentation

## 2017-02-27 DIAGNOSIS — Z955 Presence of coronary angioplasty implant and graft: Secondary | ICD-10-CM | POA: Diagnosis not present

## 2017-02-27 DIAGNOSIS — Z7982 Long term (current) use of aspirin: Secondary | ICD-10-CM | POA: Diagnosis not present

## 2017-02-27 DIAGNOSIS — F17210 Nicotine dependence, cigarettes, uncomplicated: Secondary | ICD-10-CM | POA: Diagnosis not present

## 2017-02-27 DIAGNOSIS — K0889 Other specified disorders of teeth and supporting structures: Secondary | ICD-10-CM | POA: Diagnosis not present

## 2017-02-27 DIAGNOSIS — I10 Essential (primary) hypertension: Secondary | ICD-10-CM | POA: Diagnosis not present

## 2017-02-27 DIAGNOSIS — E119 Type 2 diabetes mellitus without complications: Secondary | ICD-10-CM | POA: Diagnosis not present

## 2017-02-27 DIAGNOSIS — Z79899 Other long term (current) drug therapy: Secondary | ICD-10-CM | POA: Diagnosis not present

## 2017-02-27 MED ORDER — PENICILLIN V POTASSIUM 250 MG PO TABS
250.0000 mg | ORAL_TABLET | Freq: Once | ORAL | Status: AC
  Administered 2017-02-27: 250 mg via ORAL
  Filled 2017-02-27: qty 1

## 2017-02-27 MED ORDER — PENICILLIN V POTASSIUM 500 MG PO TABS: 500.0000 mg | ORAL_TABLET | Freq: Four times a day (QID) | ORAL | 0 refills | Status: DC

## 2017-02-27 MED ORDER — BUPIVACAINE-EPINEPHRINE (PF) 0.5% -1:200000 IJ SOLN
1.8000 mL | Freq: Once | INTRAMUSCULAR | Status: AC
  Administered 2017-02-27: 1.8 mL
  Filled 2017-02-27: qty 1.8

## 2017-02-27 MED ORDER — IBUPROFEN 400 MG PO TABS
400.0000 mg | ORAL_TABLET | Freq: Once | ORAL | Status: AC
  Administered 2017-02-27: 400 mg via ORAL

## 2017-02-27 MED ORDER — IBUPROFEN 400 MG PO TABS
ORAL_TABLET | ORAL | Status: AC
  Filled 2017-02-27: qty 1

## 2017-02-27 NOTE — ED Triage Notes (Signed)
Pt presents to ED for left lower dental pain starting after dinner this evening.   Pt states he took ibuprofen at home with minimal relief.

## 2017-02-27 NOTE — ED Provider Notes (Signed)
[facility] DEPT Provider Note   CSN: [id number] Arrival date & time: 02/27/17  0114     History   Chief Complaint Chief Complaint  Patient presents with  . Dental Pain    HPI Nicolas Ray is a 66 y.o. male.  Patient presents to the emergency department with chief complaint of dental pain. He states pain began last night at 8:30 PM. He denies any known injury or trauma to his tooth. He has tried taking ibuprofen with no relief. States pain is severe. He denies any fevers, chills, nausea, or vomiting. He does not have a dentist. There are no other associated symptoms or modifying factors.   The history is provided by the patient. No language interpreter was used.    Past Medical History:  Diagnosis Date  . CAD (coronary artery disease) 04/18/2015   a. Lat STEMI 8/16 - LHC:  pLAD 90, mLAD 50, D1 100, dLCx 90, OM1 80, pRCA 75, EF 35-45% >> DES to D1;  b. staged PCI 04/20/15 - DES to mid LAD and DES to prox RCA  . Depression   . DM2 (diabetes mellitus, type 2) ([place])   . HTN (hypertension) 02/08/2013   Renal Artery [name] 9/16: Bilateral RAS 1-59%, SMA >70%  . Hypothyroidism   . Ischemic cardiomyopathy    a. Echo 8/16:  Mild LVH, EF 40-45%, mid-apical anterolateral and apical AK, grade 1 diastolic dysfunction, trivial effusion    Patient Active Problem List   Diagnosis Date Noted  . Coronary artery disease involving native coronary artery of native heart with unstable angina pectoris ([place])   . Type II diabetes mellitus with complication ([place]) 61/44/3154  . Acute systolic heart failure ([place]) 04/19/2015  . Acute MI, lateral wall, initial episode of care ([place]) 04/18/2015  . ST elevation myocardial infarction (STEMI) of lateral wall ([place]) 04/18/2015  . Other male erectile dysfunction 04/18/2014  . Diabetes ([place]) 10/13/2013  . Sciatica 03/15/2013  . Hypothyroidism 02/08/2013  . Essential hypertension 02/08/2013  . Depression 01/25/2013  . Low back pain 01/25/2013    Past Surgical  History:  Procedure Laterality Date  . CARDIAC CATHETERIZATION N/A 04/18/2015   Procedure: Left Heart Cath and Coronary Angiography;  Surgeon: Sherren Mocha, MD;  Location: [facility];  Service: Cardiovascular;  Laterality: N/A;  . CARDIAC CATHETERIZATION N/A 04/18/2015   Procedure: Coronary Stent Intervention;  Surgeon: Sherren Mocha, MD;  Location: [facility];  Service: Cardiovascular;  Laterality: N/A;  . CARDIAC CATHETERIZATION N/A 04/20/2015   Procedure: Coronary Stent Intervention;  Surgeon: Burnell Blanks, MD;  Location: [facility];  Service: Cardiovascular;  Laterality: N/A;  . CORONARY STENT PLACEMENT    . no prior surgery         Home Medications    Prior to Admission medications   Medication Sig Start Date End Date Taking? Authorizing Provider  aspirin EC 81 MG tablet Take 81 mg by mouth daily.    [provider]  atorvastatin (LIPITOR) 20 MG tablet TAKE 1 TABLET(20 MG) BY MOUTH DAILY AT 6 PM 04/15/16   Richardson Dopp T, PA-C  carvedilol (COREG) 12.5 MG tablet Take 1 tablet (12.5 mg total) by mouth 2 (two) times daily. 08/21/16 11/19/16  Sherren Mocha, MD  Cholecalciferol (VITAMIN D PO) Take 5,000 Units by mouth daily.     [provider]  clopidogrel (PLAVIX) 75 MG tablet Take 1 tablet (75 mg total) by mouth daily. 07/21/16   Richardson Dopp T, PA-C  glucose blood (  ACCU-CHEK ACTIVE STRIPS) test strip Test your sugar levels once a day.  Pt needs test strips for the accu-chek guide meter 08/07/16   Elayne Snare, MD  isosorbide mononitrate (IMDUR) 30 MG 24 hr tablet Take 15 mg by mouth daily. 07/04/16   [provider]  levothyroxine (SYNTHROID, LEVOTHROID) 125 MCG tablet Take 1 tablet (125 mcg total) by mouth daily. 08/08/16   Elayne Snare, MD  linagliptin (TRADJENTA) 5 MG TABS tablet Take 1 tablet (5 mg total) by mouth daily. 10/13/16   Elayne Snare, MD  magnesium 30 MG tablet Take 500 mg by mouth 2 (two) times daily.     [provider]  phytonadione (VITAMIN K) 5 MG tablet TAKE 1 CAPSULE BY MOUTH DAILY PATIENT NOT SURE OF DOSAGE    [provider]    Family History Family History  Problem Relation Age of Onset  . Heart disease Mother   . Cancer Father   . Thyroid disease Sister   . Colon cancer Neg Hx   . Diabetes Neg Hx     Social History Social History  Substance Use Topics  . Smoking status: Light Tobacco Smoker    Packs/day: 0.25    Types: Cigarettes  . Smokeless tobacco: Never Used  . Alcohol use Yes     Allergies   Patient has no known allergies.   Review of Systems Review of Systems  Constitutional: Negative for chills and fever.  HENT: Positive for dental problem. Negative for drooling.   Neurological: Negative for speech difficulty.  Psychiatric/Behavioral: Positive for sleep disturbance.     Physical Exam Updated Vital Signs BP (!) 173/96   Pulse 75   Temp 97.3 F (36.3 C) (Oral)   Resp 18   SpO2 99%   Physical Exam Physical Exam  Constitutional: Pt appears well-developed and well-nourished.  HENT:  Head: Normocephalic.  Right Ear: Tympanic membrane, external ear and ear canal normal.  Left Ear: Tympanic membrane, external ear and ear canal normal.  Nose: Nose normal. Right sinus exhibits no maxillary sinus tenderness and no frontal sinus tenderness. Left sinus exhibits no maxillary sinus tenderness and no frontal sinus tenderness.  Mouth/Throat: Uvula is midline, oropharynx is clear and moist and mucous membranes are normal. No oral lesions. No uvula swelling or lacerations. No oropharyngeal exudate, posterior oropharyngeal edema, posterior oropharyngeal erythema or tonsillar abscesses.  Poor dentition No gingival swelling, fluctuance or induration No gross abscess  No sublingual edema, tenderness to palpation, or sign of Ludwig's angina, or deep space infection Pain at left lower K9 Eyes: Conjunctivae are normal. Pupils are equal, round, and  reactive to light. Right eye exhibits no discharge. Left eye exhibits no discharge.  Neck: Normal range of motion. Neck supple.  No stridor Handling secretions without difficulty No nuchal rigidity No cervical lymphadenopathy Cardiovascular: Normal rate, regular rhythm and normal heart sounds.   Pulmonary/Chest: Effort normal. No respiratory distress.  Equal chest rise  Abdominal: Soft. Bowel sounds are normal. Pt exhibits no distension. There is no tenderness.  Lymphadenopathy: Pt has no cervical adenopathy.  Neurological: Pt is alert and oriented x 4  Skin: Skin is warm and dry.  Psychiatric: Pt has a normal mood and affect.  Nursing note and vitals reviewed.    ED Treatments / Results  Labs (all labs ordered are listed, but only abnormal results are displayed) Labs Reviewed - No data to display  EKG  EKG Interpretation None       Radiology No results found.  Procedures  Dental Block Date/Time: 02/27/2017 4:59 AM Performed by: Montine Circle Authorized by: Montine Circle   Consent:    Consent obtained:  Verbal   Consent given by:  Patient   Risks discussed:  Infection, pain and unsuccessful block   Alternatives discussed:  No treatment Indications:    Indications: dental pain   Location:    Block type:  Inferior alveolar   Laterality:  Left Procedure details (see MAR for exact dosages):    Topical anesthetic:  Benzocaine gel   Syringe type:  Controlled syringe   Needle gauge:  27 G   Anesthetic injected:  Bupivacaine 0.5% WITH epi Post-procedure details:    Outcome:  Pain improved   Patient tolerance of procedure:  Tolerated well, no immediate complications   (including critical care time)  Medications Ordered in ED Medications  bupivacaine-epinephrine (MARCAINE W/ EPI) 0.5% -1:200000 injection 1.8 mL (not administered)  ibuprofen (ADVIL,MOTRIN) tablet 400 mg (400 mg Oral Given 02/27/17 0143)     Initial Impression / Assessment and Plan / ED  Course  I have reviewed the triage vital signs and the nursing notes.  Pertinent labs & imaging results that were available during my care of the patient were reviewed by me and considered in my medical decision making (see chart for details).     Patient with dentalgia.  No abscess requiring immediate incision and drainage.  Exam not concerning for Ludwig's angina or pharyngeal abscess.  Will treat with penicillin. Pt instructed to follow-up with dentist.  Discussed return precautions. Pt safe for discharge.   Final Clinical Impressions(s) / ED Diagnoses   Final diagnoses:  Pain, dental    New Prescriptions New Prescriptions   No medications on file     Montine Circle, Hershal Coria 02/27/17 0500    Ripley Fraise, MD 02/27/17 [phone]

## 2017-02-27 NOTE — ED Notes (Signed)
PA administered Bupivacaine-Epinephrine , pt. Tolerated procedure .

## 2017-04-19 ENCOUNTER — Other Ambulatory Visit: Payer: Self-pay | Admitting: Physician Assistant

## 2017-04-19 DIAGNOSIS — I2510 Atherosclerotic heart disease of native coronary artery without angina pectoris: Secondary | ICD-10-CM

## 2017-04-21 ENCOUNTER — Telehealth: Payer: Self-pay | Admitting: Cardiovascular Disease

## 2017-04-21 NOTE — Telephone Encounter (Signed)
Patient has been out of imdur for 4 days and said he feels fine. Patient wants to know directly from Dr. Burt Knack if he is to continue taking this medication. If so, he is requesting a refill be sent to his pharmacy. Nurse advised patient that he did need to continue the medication but patient insisted on Dr. Burt Knack stating whether or not he needed to stay on imdur.

## 2017-04-21 NOTE — Telephone Encounter (Signed)
New message      Pt c/o medication issue:  1. Name of Medication:  isosorbide 2. How are you currently taking this medication (dosage and times per day)? 30mg  3. Are you having a reaction (difficulty breathing--STAT)? no  4. What is your medication issue?  Pt has not had medication in 4 days.  He states that he feels fine.  Can he stay off this medication?

## 2017-04-23 MED ORDER — ISOSORBIDE MONONITRATE ER 30 MG PO TB24
15.0000 mg | ORAL_TABLET | Freq: Every day | ORAL | 3 refills | Status: DC
Start: 2017-04-23 — End: 2017-09-15

## 2017-04-23 NOTE — Telephone Encounter (Signed)
I spoke with Dr Burt Knack and discussed if the pt should continue Isosorbide. Per Dr Burt Knack the pt should continue this medication.  Rx sent to the pharmacy and pt aware.

## 2017-04-23 NOTE — Telephone Encounter (Signed)
F/u message  Pt call to f/u on Dr. Burt Knack decision on Imdur. Pt was inform Dr. Burt Knack has not responded, but once he does the RN will reach back out to him with an update. Please call back

## 2017-05-01 ENCOUNTER — Other Ambulatory Visit: Payer: Self-pay | Admitting: Cardiovascular Disease

## 2017-05-01 ENCOUNTER — Other Ambulatory Visit: Payer: Self-pay | Admitting: Physician Assistant

## 2017-05-01 DIAGNOSIS — E785 Hyperlipidemia, unspecified: Secondary | ICD-10-CM

## 2017-05-09 DIAGNOSIS — J069 Acute upper respiratory infection, unspecified: Secondary | ICD-10-CM | POA: Diagnosis not present

## 2017-05-12 ENCOUNTER — Encounter (HOSPITAL_COMMUNITY): Payer: Self-pay | Admitting: *Deleted

## 2017-05-12 ENCOUNTER — Emergency Department (HOSPITAL_COMMUNITY)
Admission: EM | Admit: 2017-05-12 | Discharge: 2017-05-12 | Disposition: A | Discharge status: Home / Self Care | Payer: PPO | Attending: Emergency Medicine | Admitting: Emergency Medicine

## 2017-05-12 DIAGNOSIS — E119 Type 2 diabetes mellitus without complications: Secondary | ICD-10-CM | POA: Diagnosis not present

## 2017-05-12 DIAGNOSIS — E039 Hypothyroidism, unspecified: Secondary | ICD-10-CM | POA: Diagnosis not present

## 2017-05-12 DIAGNOSIS — F17210 Nicotine dependence, cigarettes, uncomplicated: Secondary | ICD-10-CM | POA: Insufficient documentation

## 2017-05-12 DIAGNOSIS — I2510 Atherosclerotic heart disease of native coronary artery without angina pectoris: Secondary | ICD-10-CM | POA: Diagnosis not present

## 2017-05-12 DIAGNOSIS — J029 Acute pharyngitis, unspecified: Secondary | ICD-10-CM | POA: Insufficient documentation

## 2017-05-12 DIAGNOSIS — J028 Acute pharyngitis due to other specified organisms: Secondary | ICD-10-CM | POA: Diagnosis not present

## 2017-05-12 DIAGNOSIS — Z79899 Other long term (current) drug therapy: Secondary | ICD-10-CM | POA: Diagnosis not present

## 2017-05-12 DIAGNOSIS — Z7982 Long term (current) use of aspirin: Secondary | ICD-10-CM | POA: Diagnosis not present

## 2017-05-12 DIAGNOSIS — I10 Essential (primary) hypertension: Secondary | ICD-10-CM | POA: Insufficient documentation

## 2017-05-12 LAB — RAPID STREP SCREEN (MED CTR MEBANE ONLY): Streptococcus, Group A Screen (Direct): NEGATIVE

## 2017-05-12 MED ORDER — BENZONATATE 100 MG PO CAPS: 100.0000 mg | ORAL_CAPSULE | Freq: Three times a day (TID) | ORAL | 0 refills | Status: DC

## 2017-05-12 MED ORDER — LIDOCAINE VISCOUS 2 % MT SOLN: 15.0000 mL | OROMUCOSAL | 0 refills | Status: DC | PRN

## 2017-05-12 NOTE — ED Provider Notes (Signed)
[facility] DEPT Provider Note   CSN: [id number] Arrival date & time: 05/12/17  0600     History   Chief Complaint Chief Complaint  Patient presents with  . Sore Throat    HPI Nicolas Ray is a 66 y.o. male who presents emergency department today for 6 days of sore throat. Patient states that he was seen in a walk-in clinic on Sunday evening where he do not have a strep test and told that he had a viral infection and sent home without any prescriptions. He states that he has mild dysphagia with swallowing and associated non-productive cough. He has been taking cough drops and Mucinex for this without much relief. He is concerned this may be strep Denies fever, chills, sob, cp, inability to control secretions, N/V, abdominal pain, congestion, voice change, dental disease.   HPI  Past Medical History:  Diagnosis Date  . CAD (coronary artery disease) 04/18/2015   a. Lat STEMI 8/16 - LHC:  pLAD 90, mLAD 50, D1 100, dLCx 90, OM1 80, pRCA 75, EF 35-45% >> DES to D1;  b. staged PCI 04/20/15 - DES to mid LAD and DES to prox RCA  . Depression   . DM2 (diabetes mellitus, type 2) ([place])   . HTN (hypertension) 02/08/2013   Renal Artery [name] 9/16: Bilateral RAS 1-59%, SMA >70%  . Hypothyroidism   . Ischemic cardiomyopathy    a. Echo 8/16:  Mild LVH, EF 40-45%, mid-apical anterolateral and apical AK, grade 1 diastolic dysfunction, trivial effusion    Patient Active Problem List   Diagnosis Date Noted  . Coronary artery disease involving native coronary artery of native heart with unstable angina pectoris ([place])   . Type II diabetes mellitus with complication ([place]) 48/25/0037  . Acute systolic heart failure ([place]) 04/19/2015  . Acute MI, lateral wall, initial episode of care ([place]) 04/18/2015  . ST elevation myocardial infarction (STEMI) of lateral wall ([place]) 04/18/2015  . Other male erectile dysfunction 04/18/2014  . Diabetes ([place]) 10/13/2013  . Sciatica 03/15/2013  . Hypothyroidism 02/08/2013    . Essential hypertension 02/08/2013  . Depression 01/25/2013  . Low back pain 01/25/2013    Past Surgical History:  Procedure Laterality Date  . CARDIAC CATHETERIZATION N/A 04/18/2015   Procedure: Left Heart Cath and Coronary Angiography;  Surgeon: Sherren Mocha, MD;  Location: [facility];  Service: Cardiovascular;  Laterality: N/A;  . CARDIAC CATHETERIZATION N/A 04/18/2015   Procedure: Coronary Stent Intervention;  Surgeon: Sherren Mocha, MD;  Location: [facility];  Service: Cardiovascular;  Laterality: N/A;  . CARDIAC CATHETERIZATION N/A 04/20/2015   Procedure: Coronary Stent Intervention;  Surgeon: Burnell Blanks, MD;  Location: [facility];  Service: Cardiovascular;  Laterality: N/A;  . CORONARY STENT PLACEMENT    . no prior surgery         Home Medications    Prior to Admission medications   Medication Sig Start Date End Date Taking? Authorizing Provider  aspirin EC 81 MG tablet Take 81 mg by mouth daily.    [provider]  atorvastatin (LIPITOR) 20 MG tablet TAKE 1 TABLET(20 MG) BY MOUTH DAILY AT 6 PM 05/01/17   Sherren Mocha, MD  benzonatate (TESSALON) 100 MG capsule Take 1 capsule (100 mg total) by mouth every 8 (eight) hours. 05/12/17   Raeley Gilmore, Barth Kirks, PA-C  carvedilol (COREG) 12.5 MG tablet Take 1 tablet (12.5 mg total) by mouth 2 (two) times daily. 08/21/16 11/19/16  Sherren Mocha, MD  Cholecalciferol (  VITAMIN D PO) Take 5,000 Units by mouth daily.     [provider]  clopidogrel (PLAVIX) 75 MG tablet Take 1 tablet (75 mg total) by mouth daily. 07/21/16   Richardson Dopp T, PA-C  glucose blood (ACCU-CHEK ACTIVE STRIPS) test strip Test your sugar levels once a day.  Pt needs test strips for the accu-chek guide meter 08/07/16   Elayne Snare, MD  isosorbide mononitrate (IMDUR) 30 MG 24 hr tablet Take 0.5 tablets (15 mg total) by mouth daily. 04/23/17   Sherren Mocha, MD  levothyroxine (SYNTHROID, LEVOTHROID) 125 MCG tablet  Take 1 tablet (125 mcg total) by mouth daily. 08/08/16   Elayne Snare, MD  lidocaine (XYLOCAINE) 2 % solution Use as directed 15 mLs in the mouth or throat as needed for mouth pain. 05/12/17   Yaneliz Radebaugh, Barth Kirks, PA-C  linagliptin (TRADJENTA) 5 MG TABS tablet Take 1 tablet (5 mg total) by mouth daily. 10/13/16   Elayne Snare, MD  magnesium 30 MG tablet Take 500 mg by mouth 2 (two) times daily.    [provider]  penicillin v potassium (VEETID) 500 MG tablet Take 1 tablet (500 mg total) by mouth 4 (four) times daily. 02/27/17   Montine Circle, PA-C  phytonadione (VITAMIN K) 5 MG tablet TAKE 1 CAPSULE BY MOUTH DAILY PATIENT NOT SURE OF DOSAGE    [provider]    Family History Family History  Problem Relation Age of Onset  . Heart disease Mother   . Cancer Father   . Thyroid disease Sister   . Colon cancer Neg Hx   . Diabetes Neg Hx     Social History Social History  Substance Use Topics  . Smoking status: Light Tobacco Smoker    Packs/day: 0.25    Types: Cigarettes  . Smokeless tobacco: Never Used  . Alcohol use Yes     Allergies   Patient has no known allergies.   Review of Systems Review of Systems  Constitutional: Negative for chills, diaphoresis and fever.  HENT: Positive for sore throat. Negative for trouble swallowing.   Respiratory: Negative for shortness of breath.   Cardiovascular: Negative for chest pain.  Gastrointestinal: Negative for abdominal pain, diarrhea, nausea and vomiting.  Musculoskeletal: Negative for neck pain.  Skin: Negative for color change.     Physical Exam Updated Vital Signs BP (!) 144/84 (BP Location: Left Arm)   Pulse 79   Temp 98.1 F (36.7 C) (Oral)   Resp 18   Ht 5\' 8"  (1.727 m)   Wt 95.3 kg (210 lb)   SpO2 98%   BMI 31.93 kg/m   Physical Exam  Constitutional: He appears well-developed and well-nourished.  Nontoxic-appearing  HENT:  Head: Normocephalic and atraumatic.  Right Ear: External ear normal.    Left Ear: External ear normal.  The patient has normal phonation and is in control of secretions. No stridor.  Midline uvula without edema. Soft palate rises symmetrically. Mild tonsillar erythema. No exudates. No PTA. Tongue protrusion is normal. No trismus. No creptius on neck palpation and patient has good dentition. No gingival erythema or fluctuance noted. Mucus membranes moist.   Eyes: Conjunctivae are normal. Right eye exhibits no discharge. Left eye exhibits no discharge. No scleral icterus.  Neck: Normal range of motion. Neck supple.  Cardiovascular: Normal rate and regular rhythm.   Pulmonary/Chest: Effort normal and breath sounds normal. No respiratory distress.  Lymphadenopathy:    He has no cervical adenopathy.  Neurological: He is alert.  Skin:  No pallor.  Psychiatric: He has a normal mood and affect.  Nursing note and vitals reviewed.    ED Treatments / Results  Labs (all labs ordered are listed, but only abnormal results are displayed) Labs Reviewed  RAPID STREP SCREEN (NOT AT [facility])  CULTURE, GROUP A STREP [facility])    EKG  EKG Interpretation None       Radiology No results found.  Procedures Procedures (including critical care time)  Medications Ordered in ED Medications - No data to display   Initial Impression / Assessment and Plan / ED Course  I have reviewed the triage vital signs and the nursing notes.  Pertinent labs & imaging results that were available during my care of the patient were reviewed by me and considered in my medical decision making (see chart for details).      Patient with sore throat. The patient has normal phonation and is in control of secretions which makes me believe this is not epiglottitis. No stridor to indicate FB. No trismus. Midline uvula, no exudates which makes PTA less concerning.  No creptius on neck palpation and patient has good dentition, do not suspect ludwigs. Strep test negative. Suspect viral pharyngitis. No  abx indicated. Discharged with symptomatic tx for pain  Pt does not appear dehydrated, but did discuss importance of water rehydration.  Specific return precautions discussed. Pt able to drink water in ED without difficulty with intact air way. Recommended PCP follow up.  Final Clinical Impressions(s) / ED Diagnoses   Final diagnoses:  Viral pharyngitis    New Prescriptions New Prescriptions   BENZONATATE (TESSALON) 100 MG CAPSULE    Take 1 capsule (100 mg total) by mouth every 8 (eight) hours.   LIDOCAINE (XYLOCAINE) 2 % SOLUTION    Use as directed 15 mLs in the mouth or throat as needed for mouth pain.     Jillyn Ledger, PA-C 05/12/17 Arnold Long    Duffy Bruce, MD 05/14/17 1322

## 2017-05-12 NOTE — ED Triage Notes (Signed)
Pt c/o sore throat for the past 5 days; has been gargling salt water and taking mucinex without relief.

## 2017-05-12 NOTE — Discharge Instructions (Signed)
Please read and follow all provided instructions.  Your diagnoses today include: Viral Pharyngitis   Tests performed today include: Vital signs. See below for your results today.  Strep Test: This was negative. A throat culture was sent as a precaution and results will be available in 2-3 days. If it returns positive for strep, you will be called by our flow manager for further instructions.  Medications prescribed:  1. Viscous Lidocaine. - swish, gargle and spit out. Do not swallow. Take as needed for sore throat. 2. Tessalon - take as needed every 8 hours for cough.   Home care instructions:  At this time, it appears that your sore throat is caused by a viral infection. Antibiotics do NOT help a viral infection and can cause unwanted side effects. The fever should resolve in 2-3 days and sore throat should begin to resolve in 2-3 days as well. Continued to alternate between Tylenol and ibuprofen for pain. May consider over-the-counter Benadryl for additional relief (decrease secretions). Also discard your toothbrush and begin using a new one in 3 days.   Follow-up instructions: Please follow-up with your primary care provider in 2-3 days for follow up.   Return instructions:  Return to the ED sooner for worsening condition, inability to swallow, breathing difficulty, new concerns.  Additional Information:  Your vital signs today were: BP (!) 144/84 (BP Location: Left Arm)    Pulse 79    Temp 98.1 F (36.7 C) (Oral)    Resp 18    Ht 5\' 8"  (1.727 m)    Wt 95.3 kg (210 lb)    SpO2 98%    BMI 31.93 kg/m  If your blood pressure (BP) was elevated above 135/85 this visit, please have this repeated by your doctor within one month. ---------------

## 2017-05-14 LAB — CULTURE, GROUP A STREP (THRC)

## 2017-05-30 ENCOUNTER — Other Ambulatory Visit: Payer: Self-pay | Admitting: Cardiovascular Disease

## 2017-05-30 DIAGNOSIS — E785 Hyperlipidemia, unspecified: Secondary | ICD-10-CM

## 2017-06-27 ENCOUNTER — Other Ambulatory Visit: Payer: Self-pay | Admitting: Cardiovascular Disease

## 2017-06-27 DIAGNOSIS — E785 Hyperlipidemia, unspecified: Secondary | ICD-10-CM

## 2017-07-15 ENCOUNTER — Other Ambulatory Visit: Payer: Self-pay | Admitting: Physician Assistant

## 2017-07-15 ENCOUNTER — Other Ambulatory Visit: Payer: Self-pay | Admitting: Cardiovascular Disease

## 2017-07-15 ENCOUNTER — Telehealth: Payer: Self-pay | Admitting: Endocrinology

## 2017-07-16 ENCOUNTER — Other Ambulatory Visit: Payer: Self-pay | Admitting: Physician Assistant

## 2017-07-16 ENCOUNTER — Other Ambulatory Visit: Payer: Self-pay | Admitting: Endocrinology

## 2017-07-16 NOTE — Telephone Encounter (Signed)
This patient has not been seen since 09/2016 and no showed last appointment. Please advise if okay to fill or refuse with note to come in for an appointment.

## 2017-07-16 NOTE — Telephone Encounter (Signed)
15 tablets with note to make an appointment

## 2017-07-16 NOTE — Telephone Encounter (Signed)
This has been filled with note to make an appointment for further refills.

## 2017-08-11 ENCOUNTER — Emergency Department (HOSPITAL_COMMUNITY)
Admission: EM | Admit: 2017-08-11 | Discharge: 2017-08-11 | Disposition: A | Discharge status: Home / Self Care | Payer: PPO | Attending: Emergency Medicine | Admitting: Emergency Medicine

## 2017-08-11 ENCOUNTER — Encounter (HOSPITAL_COMMUNITY): Payer: Self-pay

## 2017-08-11 ENCOUNTER — Emergency Department (HOSPITAL_COMMUNITY): Payer: PPO

## 2017-08-11 ENCOUNTER — Other Ambulatory Visit: Payer: Self-pay

## 2017-08-11 DIAGNOSIS — Z7982 Long term (current) use of aspirin: Secondary | ICD-10-CM | POA: Insufficient documentation

## 2017-08-11 DIAGNOSIS — E119 Type 2 diabetes mellitus without complications: Secondary | ICD-10-CM | POA: Insufficient documentation

## 2017-08-11 DIAGNOSIS — Z79899 Other long term (current) drug therapy: Secondary | ICD-10-CM | POA: Insufficient documentation

## 2017-08-11 DIAGNOSIS — N209 Urinary calculus, unspecified: Secondary | ICD-10-CM | POA: Insufficient documentation

## 2017-08-11 DIAGNOSIS — R109 Unspecified abdominal pain: Secondary | ICD-10-CM | POA: Diagnosis not present

## 2017-08-11 DIAGNOSIS — N200 Calculus of kidney: Secondary | ICD-10-CM | POA: Diagnosis not present

## 2017-08-11 DIAGNOSIS — F17210 Nicotine dependence, cigarettes, uncomplicated: Secondary | ICD-10-CM | POA: Diagnosis not present

## 2017-08-11 DIAGNOSIS — I10 Essential (primary) hypertension: Secondary | ICD-10-CM | POA: Insufficient documentation

## 2017-08-11 DIAGNOSIS — Z7901 Long term (current) use of anticoagulants: Secondary | ICD-10-CM | POA: Diagnosis not present

## 2017-08-11 DIAGNOSIS — Z7984 Long term (current) use of oral hypoglycemic drugs: Secondary | ICD-10-CM | POA: Diagnosis not present

## 2017-08-11 DIAGNOSIS — I2510 Atherosclerotic heart disease of native coronary artery without angina pectoris: Secondary | ICD-10-CM | POA: Diagnosis not present

## 2017-08-11 LAB — URINALYSIS, ROUTINE W REFLEX MICROSCOPIC
BACTERIA UA: NONE SEEN
Bilirubin Urine: NEGATIVE
Glucose, UA: NEGATIVE mg/dL
Ketones, ur: NEGATIVE mg/dL
Nitrite: NEGATIVE
PROTEIN: 100 mg/dL — AB
SPECIFIC GRAVITY, URINE: 1.015 (ref 1.005–1.030)
pH: 5 (ref 5.0–8.0)

## 2017-08-11 LAB — CBC
HCT: 41.2 % (ref 39.0–52.0)
Hemoglobin: 13.6 g/dL (ref 13.0–17.0)
MCH: 22.4 pg — AB (ref 26.0–34.0)
MCHC: 33 g/dL (ref 30.0–36.0)
MCV: 68 fL — ABNORMAL LOW (ref 78.0–100.0)
PLATELETS: 162 10*3/uL (ref 150–400)
RBC: 6.06 MIL/uL — AB (ref 4.22–5.81)
RDW: 15 % (ref 11.5–15.5)
WBC: 9.7 10*3/uL (ref 4.0–10.5)

## 2017-08-11 LAB — I-STAT CHEM 8, ED
BUN: 25 mg/dL — AB (ref 6–20)
CALCIUM ION: 1.19 mmol/L (ref 1.15–1.40)
Chloride: 101 mmol/L (ref 101–111)
Creatinine, Ser: 1.6 mg/dL — ABNORMAL HIGH (ref 0.61–1.24)
Glucose, Bld: 106 mg/dL — ABNORMAL HIGH (ref 65–99)
HEMATOCRIT: 45 % (ref 39.0–52.0)
Hemoglobin: 15.3 g/dL (ref 13.0–17.0)
Potassium: 4.4 mmol/L (ref 3.5–5.1)
SODIUM: 138 mmol/L (ref 135–145)
TCO2: 28 mmol/L (ref 22–32)

## 2017-08-11 MED ORDER — SODIUM CHLORIDE 0.9 % IV BOLUS (SEPSIS)
1000.0000 mL | Freq: Once | INTRAVENOUS | Status: AC
  Administered 2017-08-11: 1000 mL via INTRAVENOUS

## 2017-08-11 MED ORDER — KETOROLAC TROMETHAMINE 30 MG/ML IJ SOLN
30.0000 mg | Freq: Once | INTRAMUSCULAR | Status: AC
  Administered 2017-08-11: 30 mg via INTRAVENOUS
  Filled 2017-08-11: qty 1

## 2017-08-11 NOTE — ED Provider Notes (Signed)
[facility] EMERGENCY DEPARTMENT Provider Note   CSN: [id number] Arrival date & time: 08/11/17  1200     History   Chief Complaint Chief Complaint  Patient presents with  . Flank Pain    HPI Nicolas Ray is a 66 y.o. male.  HPI  66 y.o. male with a hx of CAD, DM2, HTN, presents to the Emergency Department today due to right sided flank pain. Focal pain in lower back. Notes worse with certain movements and states pain has been consistent 8/10. Throbbing sensation. No dysuria or hematuria. No N/V. No chills. No CP/SOB/ABD pain. Notes hx stones. OTC medications with minimal relief. No fevers. No other symptoms noted   Past Medical History:  Diagnosis Date  . CAD (coronary artery disease) 04/18/2015   a. Lat STEMI 8/16 - LHC:  pLAD 90, mLAD 50, D1 100, dLCx 90, OM1 80, pRCA 75, EF 35-45% >> DES to D1;  b. staged PCI 04/20/15 - DES to mid LAD and DES to prox RCA  . Depression   . DM2 (diabetes mellitus, type 2) ([place])   . HTN (hypertension) 02/08/2013   Renal Artery [name] 9/16: Bilateral RAS 1-59%, SMA >70%  . Hypothyroidism   . Ischemic cardiomyopathy    a. Echo 8/16:  Mild LVH, EF 40-45%, mid-apical anterolateral and apical AK, grade 1 diastolic dysfunction, trivial effusion    Patient Active Problem List   Diagnosis Date Noted  . Coronary artery disease involving native coronary artery of native heart with unstable angina pectoris ([place])   . Type II diabetes mellitus with complication ([place]) 96/78/9381  . Acute systolic heart failure ([place]) 04/19/2015  . Acute MI, lateral wall, initial episode of care ([place]) 04/18/2015  . ST elevation myocardial infarction (STEMI) of lateral wall ([place]) 04/18/2015  . Other male erectile dysfunction 04/18/2014  . Diabetes ([place]) 10/13/2013  . Sciatica 03/15/2013  . Hypothyroidism 02/08/2013  . Essential hypertension 02/08/2013  . Depression 01/25/2013  . Low back pain 01/25/2013    Past Surgical History:  Procedure Laterality Date    . CARDIAC CATHETERIZATION N/A 04/18/2015   Procedure: Left Heart Cath and Coronary Angiography;  Surgeon: Sherren Mocha, MD;  Location: [facility];  Service: Cardiovascular;  Laterality: N/A;  . CARDIAC CATHETERIZATION N/A 04/18/2015   Procedure: Coronary Stent Intervention;  Surgeon: Sherren Mocha, MD;  Location: [facility];  Service: Cardiovascular;  Laterality: N/A;  . CARDIAC CATHETERIZATION N/A 04/20/2015   Procedure: Coronary Stent Intervention;  Surgeon: Burnell Blanks, MD;  Location: [facility];  Service: Cardiovascular;  Laterality: N/A;  . CORONARY STENT PLACEMENT    . no prior surgery         Home Medications    Prior to Admission medications   Medication Sig Start Date End Date Taking? Authorizing Provider  aspirin EC 81 MG tablet Take 81 mg by mouth daily.    [provider]  atorvastatin (LIPITOR) 20 MG tablet Take 1 tablet (20 mg total) by mouth daily at 6 PM. Please make appt with Dr. Burt Knack for December. 1st attempt 06/29/17   Sherren Mocha, MD  benzonatate (TESSALON) 100 MG capsule Take 1 capsule (100 mg total) by mouth every 8 (eight) hours. 05/12/17   Maczis, Barth Kirks, PA-C  carvedilol (COREG) 12.5 MG tablet Take 1 tablet (12.5 mg total) 2 (two) times daily with a meal by mouth. Please make yearly appt with Dr. Burt Knack for December. 1st attempt 07/16/17   Sherren Mocha, MD  Cholecalciferol (VITAMIN D PO) Take 5,000 Units by mouth daily.     [provider]  clopidogrel (PLAVIX) 75 MG tablet Take 1 tablet (75 mg total) daily by mouth. Please make yearly appt with Dr. Burt Knack for December before anymore refills. 1st attempt 07/16/17   Richardson Dopp T, PA-C  glucose blood [facility] ACTIVE STRIPS) test strip Test your sugar levels once a day.  Pt needs test strips for the accu-chek guide meter 08/07/16   Elayne Snare, MD  isosorbide mononitrate (IMDUR) 30 MG 24 hr tablet Take 0.5 tablets (15 mg total) by mouth daily. 04/23/17    Sherren Mocha, MD  levothyroxine (SYNTHROID, LEVOTHROID) 125 MCG tablet TAKE 1 TABLET BY MOUTH DAILY 07/16/17   Elayne Snare, MD  lidocaine (XYLOCAINE) 2 % solution Use as directed 15 mLs in the mouth or throat as needed for mouth pain. 05/12/17   Maczis, Barth Kirks, PA-C  linagliptin (TRADJENTA) 5 MG TABS tablet Take 1 tablet (5 mg total) by mouth daily. 10/13/16   Elayne Snare, MD  magnesium 30 MG tablet Take 500 mg by mouth 2 (two) times daily.    [provider]  penicillin v potassium (VEETID) 500 MG tablet Take 1 tablet (500 mg total) by mouth 4 (four) times daily. 02/27/17   Montine Circle, PA-C  phytonadione (VITAMIN K) 5 MG tablet TAKE 1 CAPSULE BY MOUTH DAILY PATIENT NOT SURE OF DOSAGE    [provider]    Family History Family History  Problem Relation Age of Onset  . Heart disease Mother   . Cancer Father   . Thyroid disease Sister   . Colon cancer Neg Hx   . Diabetes Neg Hx     Social History Social History   Tobacco Use  . Smoking status: Light Tobacco Smoker    Packs/day: 0.25    Types: Cigarettes  . Smokeless tobacco: Never Used  Substance Use Topics  . Alcohol use: Yes  . Drug use: No     Allergies   Patient has no known allergies.   Review of Systems Review of Systems ROS reviewed and all are negative for acute change except as noted in the HPI.  Physical Exam Updated Vital Signs BP (!) 142/80 (BP Location: Right Arm)   Pulse 73   Temp 98.3 F (36.8 C) (Oral)   Resp 20   Ht 5\' 8"  (1.727 m)   Wt 90.7 kg (200 lb)   SpO2 99%   BMI 30.41 kg/m   Physical Exam  Constitutional: He is oriented to person, place, and time. He appears well-developed and well-nourished. No distress.  HENT:  Head: Normocephalic and atraumatic.  Right Ear: Tympanic membrane, external ear and ear canal normal.  Left Ear: Tympanic membrane, external ear and ear canal normal.  Nose: Nose normal.  Mouth/Throat: Uvula is midline, oropharynx is clear and  moist and mucous membranes are normal. No trismus in the jaw. No oropharyngeal exudate, posterior oropharyngeal erythema or tonsillar abscesses.  Eyes: EOM are normal. Pupils are equal, round, and reactive to light.  Neck: Normal range of motion. Neck supple. No tracheal deviation present.  Cardiovascular: Normal rate, regular rhythm, S1 normal, S2 normal, normal heart sounds, intact distal pulses and normal pulses.  Pulmonary/Chest: Effort normal and breath sounds normal. No respiratory distress. He has no decreased breath sounds. He has no wheezes. He has no rhonchi. He has no rales.  Abdominal: Normal appearance and bowel sounds are normal. There is no tenderness. There is CVA tenderness (  right). There is no rigidity, no rebound, no guarding, no tenderness at McBurney's point and negative Murphy's sign.  Musculoskeletal: Normal range of motion.  Neurological: He is alert and oriented to person, place, and time.  Skin: Skin is warm and dry.  Psychiatric: He has a normal mood and affect. His speech is normal and behavior is normal. Thought content normal.  Nursing note and vitals reviewed.    ED Treatments / Results  Labs (all labs ordered are listed, but only abnormal results are displayed) Labs Reviewed  URINALYSIS, ROUTINE W REFLEX MICROSCOPIC - Abnormal; Notable for the following components:      Result Value   Hgb urine dipstick SMALL (*)    Protein, ur 100 (*)    Leukocytes, UA SMALL (*)    Squamous Epithelial / LPF 0-5 (*)    All other components within normal limits  CBC - Abnormal; Notable for the following components:   RBC 6.06 (*)    MCV 68.0 (*)    MCH 22.4 (*)    All other components within normal limits  I-STAT CHEM 8, ED - Abnormal; Notable for the following components:   BUN 25 (*)    Creatinine, Ser 1.60 (*)    Glucose, Bld 106 (*)    All other components within normal limits    EKG  EKG Interpretation None       Radiology Ct Renal Stone Study  Result  Date: 08/11/2017 CLINICAL DATA:  66 year old male with right flank pain for 4 days. History kidney stone 35 years ago. Initial encounter. EXAM: CT ABDOMEN AND PELVIS WITHOUT CONTRAST TECHNIQUE: Multidetector CT imaging of the abdomen and pelvis was performed following the standard protocol without IV contrast. COMPARISON:  11/08/2015 CT FINDINGS: Lower chest: Scarring/focal bronchiectasis inferior lingula stable. Heart size within normal limits.  Coronary artery calcification. Hepatobiliary: Taking into account limitation by non contrast imaging, no worrisome hepatic lesions. No calcified gallstones. Pancreas: Taking into account limitation by non contrast imaging, no worrisome pancreatic mass or inflammation. Spleen: Taking into account limitation by non contrast imaging, no worrisome splenic mass or enlargement. Adrenals/Urinary Tract: Interval decompression of previously noted of hydronephrosis. Extrarenal pelvis configuration bilaterally. Question tiny right ureterovesical junction calculus versus artifact. Ectatic distal left ureter with atypical insertion. Low-density structures in the kidneys, some of which represent cysts, others too small to characterize. Tiny nonobstructing left renal calculi. No adrenal mass. Impression upon the bladder base by enlarged prostate gland. Stomach/Bowel: No extraluminal bowel inflammatory process. Specifically, no inflammation surrounds the appendix or terminal ileum. Portions of the stomach and bowel are under distended limiting evaluation without obvious mass identified. Vascular/Lymphatic: Atherosclerotic changes aorta with slight ectasia without aneurysm. Atherosclerotic changes aortic branch vessels, iliac arteries and femoral arteries No adenopathy. Reproductive: Enlarged lobulated prostate gland impressing upon the bladder base. Clinical and laboratory correlation recommended. Other: Fat and vessel containing inguinal hernias greater on left. No bowel containing  hernia or free intraperitoneal air Musculoskeletal: No osseous destructive lesion. Mild degenerative changes lower lumbar spine. IMPRESSION: Interval decompression of previously noted of hydronephrosis. Extrarenal pelvis configuration bilaterally. Question tiny right ureterovesical junction calculus versus artifact. Ectatic distal left ureter with atypical insertion. Low-density structures in the kidneys, some of which represent cysts, others too small to characterize. Tiny nonobstructing left renal calculi. Enlarged lobulated prostate gland impressing upon the bladder base. Clinical and laboratory correlation recommended. No extraluminal bowel inflammatory process. Specifically, no inflammation surrounds the appendix. Coronary artery calcification. Aortic Atherosclerosis (ICD10-I70.0). Electronically Signed   By:  Genia Del M.D.   On: 08/11/2017 19:13    Procedures Procedures (including critical care time)  Medications Ordered in ED Medications  sodium chloride 0.9 % bolus 1,000 mL (1,000 mLs Intravenous New Bag/Given 08/11/17 1940)  ketorolac (TORADOL) 30 MG/ML injection 30 mg (30 mg Intravenous Given 08/11/17 1940)     Initial Impression / Assessment and Plan / ED Course  I have reviewed the triage vital signs and the nursing notes.  Pertinent labs & imaging results that were available during my care of the patient were reviewed by me and considered in my medical decision making (see chart for details).  Final Clinical Impressions(s) / ED Diagnoses  {I have reviewed and evaluated the relevant laboratory values. {I have reviewed and evaluated the relevant imaging studies.  {I have reviewed the relevant previous healthcare records.  {I obtained HPI from historian.   ED Course:  Assessment: Pt is a 66 y.o. male with a hx of CAD, DM2, HTN, presents to the Emergency Department today due to right sided flank pain. Focal pain in lower back. Notes worse with certain movements and states pain has  been consistent 8/10. Throbbing sensation. No dysuria or hematuria. No N/V. No chills. No CP/SOB/ABD pain. Notes hx stones. OTC medications with minimal relief. No fevers. On exam, pt in NAD. Nontoxic/nonseptic appearing. VSS. Afebrile. Lungs CTA. Heart RRR. Abdomen nontender soft. CVA tenderness on right. UA with hematuria. No evidence of infection. Culture sent. CBC unremarkable. Chem 8 baseline. CT Renal with evidence of small right stone at UVJ. Hydro resolved. Given fluids and analgesia in ED. Pt without pain after interventions. Plan is to DC home with follow up to Urology. At time of discharge, Patient is in no acute distress. Vital Signs are stable. Patient is able to ambulate. Patient able to tolerate PO.   Disposition/Plan:  DC Home Additional Verbal discharge instructions given and discussed with patient.  Pt Instructed to f/u with PCP in the next week for evaluation and treatment of symptoms. Return precautions given Pt acknowledges and agrees with plan  Supervising Physician Valarie Merino, MD  Final diagnoses:  Nephrolithiasis    ED Discharge Orders    None       Shary Decamp, PA-C 08/11/17 2016    Valarie Merino, MD 08/11/17 2325

## 2017-08-11 NOTE — ED Notes (Signed)
Pt ambulated to the restroom without difficulty. Gait steady and even.  

## 2017-08-11 NOTE — ED Triage Notes (Signed)
Patient complains of right flank pain x 4 days, pain worse with movement, denies dysuria

## 2017-08-11 NOTE — ED Notes (Signed)
IV removed from R wrist. Site is clean, dry and intact.

## 2017-08-11 NOTE — Discharge Instructions (Signed)
Please read and follow all provided instructions.  Your diagnoses today include:  1. Nephrolithiasis     Tests performed today include: Vital signs. See below for your results today.   Medications prescribed:  Take as prescribed   Home care instructions:  Follow any educational materials contained in this packet.  Follow-up instructions: Please follow-up with your primary care provider for further evaluation of symptoms and treatment   Return instructions:  Please return to the Emergency Department if you do not get better, if you get worse, or new symptoms OR  - Fever (temperature greater than 101.60F)  - Bleeding that does not stop with holding pressure to the area    -Severe pain (please note that you may be more sore the day after your accident)  - Chest Pain  - Difficulty breathing  - Severe nausea or vomiting  - Inability to tolerate food and liquids  - Passing out  - Skin becoming red around your wounds  - Change in mental status (confusion or lethargy)  - New numbness or weakness    Please return if you have any other emergent concerns.  Additional Information:  Your vital signs today were: BP (!) 150/86    Pulse 68    Temp 98.3 F (36.8 C) (Oral)    Resp 16    Ht 5\' 8"  (1.727 m)    Wt 90.7 kg (200 lb)    SpO2 97%    BMI 30.41 kg/m  If your blood pressure (BP) was elevated above 135/85 this visit, please have this repeated by your doctor within one month. ---------------

## 2017-08-11 NOTE — ED Notes (Signed)
Patient transported to CT 

## 2017-08-11 NOTE — ED Notes (Signed)
Refused vital signs

## 2017-08-14 ENCOUNTER — Other Ambulatory Visit: Payer: Self-pay | Admitting: Cardiovascular Disease

## 2017-08-14 ENCOUNTER — Other Ambulatory Visit: Payer: Self-pay | Admitting: Physician Assistant

## 2017-08-14 DIAGNOSIS — E785 Hyperlipidemia, unspecified: Secondary | ICD-10-CM

## 2017-08-14 NOTE — Progress Notes (Unsigned)
This encounter was created in error - please disregard.

## 2017-08-28 DIAGNOSIS — E039 Hypothyroidism, unspecified: Secondary | ICD-10-CM | POA: Diagnosis not present

## 2017-08-28 DIAGNOSIS — I2510 Atherosclerotic heart disease of native coronary artery without angina pectoris: Secondary | ICD-10-CM | POA: Diagnosis not present

## 2017-08-28 DIAGNOSIS — E785 Hyperlipidemia, unspecified: Secondary | ICD-10-CM | POA: Diagnosis not present

## 2017-08-28 DIAGNOSIS — N183 Chronic kidney disease, stage 3 (moderate): Secondary | ICD-10-CM | POA: Diagnosis not present

## 2017-08-28 DIAGNOSIS — N200 Calculus of kidney: Secondary | ICD-10-CM | POA: Diagnosis not present

## 2017-08-28 DIAGNOSIS — E119 Type 2 diabetes mellitus without complications: Secondary | ICD-10-CM | POA: Diagnosis not present

## 2017-09-07 ENCOUNTER — Other Ambulatory Visit: Payer: Self-pay | Admitting: Physician Assistant

## 2017-09-10 DIAGNOSIS — Z87438 Personal history of other diseases of male genital organs: Secondary | ICD-10-CM | POA: Diagnosis not present

## 2017-09-10 DIAGNOSIS — N529 Male erectile dysfunction, unspecified: Secondary | ICD-10-CM | POA: Diagnosis not present

## 2017-09-11 ENCOUNTER — Other Ambulatory Visit: Payer: Self-pay | Admitting: Cardiovascular Disease

## 2017-09-11 NOTE — Telephone Encounter (Signed)
New message       *STAT* If patient is at the pharmacy, call can be transferred to refill team.   1. Which medications need to be refilled? (please list name of each medication and dose if known)  clopidogrel (PLAVIX) 75 MG tablet TAKE 1 TABLET BY MOUTH DAILY     2. Which pharmacy/location (including street and city if local pharmacy) is medication to be sent to? [facility] [address] [facility] [address]  3. Do they need a 30 day or 90 day supply? [facility]

## 2017-09-15 ENCOUNTER — Encounter (INDEPENDENT_AMBULATORY_CARE_PROVIDER_SITE_OTHER): Payer: Self-pay

## 2017-09-15 ENCOUNTER — Ambulatory Visit (INDEPENDENT_AMBULATORY_CARE_PROVIDER_SITE_OTHER): Payer: PPO | Admitting: Physician Assistant

## 2017-09-15 ENCOUNTER — Encounter: Payer: Self-pay | Admitting: Physician Assistant

## 2017-09-15 VITALS — BP 148/90 | HR 71 | Resp 16 | Ht 68.0 in | Wt 211.4 lb

## 2017-09-15 DIAGNOSIS — I10 Essential (primary) hypertension: Secondary | ICD-10-CM | POA: Diagnosis not present

## 2017-09-15 DIAGNOSIS — I5022 Chronic systolic (congestive) heart failure: Secondary | ICD-10-CM | POA: Diagnosis not present

## 2017-09-15 DIAGNOSIS — I255 Ischemic cardiomyopathy: Secondary | ICD-10-CM

## 2017-09-15 DIAGNOSIS — E7800 Pure hypercholesterolemia, unspecified: Secondary | ICD-10-CM

## 2017-09-15 DIAGNOSIS — E785 Hyperlipidemia, unspecified: Secondary | ICD-10-CM

## 2017-09-15 DIAGNOSIS — I25110 Atherosclerotic heart disease of native coronary artery with unstable angina pectoris: Secondary | ICD-10-CM | POA: Diagnosis not present

## 2017-09-15 MED ORDER — ATORVASTATIN CALCIUM 20 MG PO TABS: 20.0000 mg | ORAL_TABLET | Freq: Every day | ORAL | 3 refills | Status: DC

## 2017-09-15 MED ORDER — ISOSORBIDE MONONITRATE ER 30 MG PO TB24: 15.0000 mg | ORAL_TABLET | Freq: Every day | ORAL | 3 refills | Status: DC

## 2017-09-15 MED ORDER — CARVEDILOL 12.5 MG PO TABS: 12.5000 mg | ORAL_TABLET | Freq: Two times a day (BID) | ORAL | 0 refills | Status: DC

## 2017-09-15 MED ORDER — CLOPIDOGREL BISULFATE 75 MG PO TABS: 75.0000 mg | ORAL_TABLET | Freq: Every day | ORAL | 3 refills | Status: DC

## 2017-09-15 NOTE — Progress Notes (Signed)
Cardiology Office Note    Date:  09/15/2017   ID:  Nicolas Ray, DOB 05-Mar-1951, MRN [id number]  PCP:  Vernie Shanks, MD  Cardiologist:  Dr. Burt Knack  Chief Complaint: Yearly follow up  History of Present Illness:   Nicolas Ray is a 67 y.o. male with a history of CAD s/p lateral STEMI in 2016 treated with staged PCI. LVEF ranged 35-40%. He has multiple medication intolerances and medication noncompliance. Pt was previously seen by Dr. Burt Knack on 08/21/16. At that time, pt was in his usual state of health. Pt is unable to tolerate ACEI or ARB. He was euvolemic at that visit. Coreg was increased 12.5 mg BID.   He presents today for a 1 year follow up. He has been out of his cardiac medication for three days is is very irritated that our office would not refill these without a yearly follow up clinic visit.  He states that he is doing fine, but is not weighing daily and does not take his home pressure regularly.  He denies chest pain, lower extremity edema, SOB, DOE, syncope, and bleeding problems. He walks daily. He does not follow a low salt diet.      Past Medical History:  Diagnosis Date  . CAD (coronary artery disease) 04/18/2015   a. Lat STEMI 8/16 - [facility]:  pLAD 90, mLAD 50, D1 100, dLCx 90, OM1 80, pRCA 75, EF 35-45% >> DES to D1;  b. staged PCI 04/20/15 - DES to mid LAD and DES to prox RCA  . Depression   . DM2 (diabetes mellitus, type 2) ([place])   . HTN (hypertension) 02/08/2013   Renal Artery [name] 9/16: Bilateral RAS 1-59%, SMA >70%  . Hypothyroidism   . Ischemic cardiomyopathy    a. Echo 8/16:  Mild LVH, EF 40-45%, mid-apical anterolateral and apical AK, grade 1 diastolic dysfunction, trivial effusion    Past Surgical History:  Procedure Laterality Date  . CARDIAC CATHETERIZATION N/A 04/18/2015   Procedure: Left Heart Cath and Coronary Angiography;  Surgeon: Sherren Mocha, MD;  Location: [facility];  Service: Cardiovascular;  Laterality: N/A;  . CARDIAC CATHETERIZATION N/A  04/18/2015   Procedure: Coronary Stent Intervention;  Surgeon: Sherren Mocha, MD;  Location: [facility];  Service: Cardiovascular;  Laterality: N/A;  . CARDIAC CATHETERIZATION N/A 04/20/2015   Procedure: Coronary Stent Intervention;  Surgeon: Burnell Blanks, MD;  Location: [facility];  Service: Cardiovascular;  Laterality: N/A;  . CORONARY STENT PLACEMENT    . no prior surgery      Current Medications:  Prior to Admission medications   Medication Sig Start Date End Date Taking? Authorizing Provider  aspirin EC 81 MG tablet Take 81 mg by mouth daily.   Yes [provider]  atorvastatin (LIPITOR) 20 MG tablet Take 1 tablet (20 mg total) by mouth daily. Please schedule appointment for anymore refills, thanks! [phone] 2ND ATTEMPT 08/29/17  Yes Sherren Mocha, MD  carvedilol (COREG) 12.5 MG tablet Take 1 tablet (12.5 mg total) 2 (two) times daily with a meal by mouth. Please make yearly appt with Dr. Burt Knack for December. 1st attempt 07/16/17  Yes Sherren Mocha, MD  Cholecalciferol (VITAMIN D PO) Take 5,000 Units by mouth daily.    Yes [provider]  clopidogrel (PLAVIX) 75 MG tablet Take 1 tablet (75 mg total) by mouth daily. Please keep upcoming appt for future refills. Thank you 09/11/17  Yes Sherren Mocha, MD  isosorbide mononitrate (IMDUR) 30 MG  24 hr tablet Take 0.5 tablets (15 mg total) by mouth daily. 04/23/17  Yes Sherren Mocha, MD  levothyroxine (SYNTHROID, LEVOTHROID) 125 MCG tablet TAKE 1 TABLET BY MOUTH DAILY Patient taking differently: TAKE 125 mg TABLET BY MOUTH DAILY 07/16/17  Yes Elayne Snare, MD  lidocaine (XYLOCAINE) 2 % solution Use as directed 15 mLs in the mouth or throat as needed for mouth pain. 05/12/17  Yes Maczis, Barth Kirks, PA-C  linagliptin (TRADJENTA) 5 MG TABS tablet Take 1 tablet (5 mg total) by mouth daily. 10/13/16  Yes Elayne Snare, MD   Allergies:   Patient has no known allergies.   Social History    Socioeconomic History  . Marital status: Divorced    Spouse name: None  . Number of children: None  . Years of education: None  . Highest education level: None  Social Needs  . Financial resource strain: None  . Food insecurity - worry: None  . Food insecurity - inability: None  . Transportation needs - medical: None  . Transportation needs - non-medical: None  Occupational History  . None  Tobacco Use  . Smoking status: Light Tobacco Smoker    Packs/day: 0.25    Types: Cigarettes  . Smokeless tobacco: Never Used  Substance and Sexual Activity  . Alcohol use: Yes  . Drug use: No  . Sexual activity: None  Other Topics Concern  . None  Social History Narrative  . None     Family History:  The patient's family history includes Cancer in his father; Heart disease in his mother; Thyroid disease in his sister.   ROS:   Please see the history of present illness.    ROS All other systems reviewed and are negative.   PHYSICAL EXAM:   VS:  BP (!) 148/90   Pulse 71   Resp 16   Ht 5\' 8"  (1.727 m)   Wt 211 lb 6.4 oz (95.9 kg)   SpO2 98%   BMI 32.14 kg/m    GEN: Well nourished, well developed, in no acute distress  HEENT: normal  Neck: no JVD, carotid bruits, or masses Cardiac: RRR; no murmurs, rubs, or gallops,no edema  Respiratory:  clear to auscultation bilaterally, normal work of breathing GI: soft, nontender, nondistended, + BS MS: no deformity or atrophy  Skin: warm and dry, no rash Neuro:  Alert and Oriented x 3, Strength and sensation are intact Psych: euthymic mood, full affect  Wt Readings from Last 3 Encounters:  09/15/17 211 lb 6.4 oz (95.9 kg)  08/11/17 200 lb (90.7 kg)  05/12/17 210 lb (95.3 kg)      Studies/Labs Reviewed:   EKG:  EKG is not ordered today.    Recent Labs: 09/16/2016: TSH 18.20 08/11/2017: BUN 25; Creatinine, Ser 1.60; Hemoglobin 15.3; Platelets 162; Potassium 4.4; Sodium 138   Lipid Panel    Component Value Date/Time   CHOL  107 (L) 06/16/2016 0833   TRIG 128 06/16/2016 0833   HDL 30 (L) 06/16/2016 0833   CHOLHDL 3.6 06/16/2016 0833   VLDL 26 06/16/2016 0833   LDLCALC 51 06/16/2016 0833    Additional studies/ records that were reviewed today include:   Echocardiogram: 12/31/15: Study Conclusions  - Left ventricle: The cavity size was moderately dilated. Wall   thickness was increased in a pattern of moderate LVH. Systolic   function was moderately reduced. The estimated ejection fraction   was in the range of 35% to 40%. Akinesis of the apical   myocardium. Doppler  parameters are consistent with abnormal left   ventricular relaxation (grade 1 diastolic dysfunction). - Pericardium, extracardiac: A trivial pericardial effusion was   identified.  Cardiac Catheterization: 04/18/15: 1. Prox LAD lesion, 90% stenosed. 2. Mid LAD lesion, 50% stenosed. 3. 1st Mrg lesion, 80% stenosed. 4. Dist Cx lesion, 90% stenosed. 5. Prox RCA lesion, 75% stenosed. 6. There is moderate left ventricular systolic dysfunction. 7. 1st Diag lesion, 100% stenosed. There is a 0% residual stenosis post intervention. 8. A drug-eluting stent was placed.   Final conclusions:  Total occlusion of the first diagonal treated successfully with primary PCI using a long DES platform  Severe multivessel CAD with severe LAD stenosis, severe OM and distal LCx stenosis, and moderately severe ulcerated plaque in the proximal RCA  Moderate segmental LV systolic dysfunction c/w an acute lateral wall (diagonal) infarct  Recommendations: Need to consider revascularization options (multivessel stenting versus CABG). The patient appears to be noncompliant based on history and current interaction. Will review with colleagues and discuss options further with patient.    Cardiac Catheterization 04/20/15:  Prox RCA lesion, 75% stenosed. A drug-eluting stent was placed. There is a 0% residual stenosis post intervention.  Prox LAD lesion, 90%  stenosed. A drug eluting stent was placed. There is a 0% residual stenosis post intervention.  A drug-eluting stent was placed.   Recommendations: Continue ASA, Brilinta, statin, beta blocker, Ace-inh. Will continue NTG drip post stent placement given mild chest pressure. EKG post PCI with no ST changes.    ASSESSMENT & PLAN:    1. CAD - he denies anginal pain. He states that he gets heart burn when he eats tomato sauce and sardines. I advised him to avoid these foods. He states he will not. Continue ASA, plavix, and statin. Fasting lipids pending.   2. Chronic systolic CHF - pt is euvolemic on exam - he is not on a diuretic regimen at home - he is not avoiding salt and states that he was never told to weigh daily - encouraged low salt diet and daily weights  3. HTN - Blood pressure is elevated today at 148/90; however, he has not been taking his coreg or imdur. He states that he takes his pressure at home but then can't tell me what it has been running because he doesn't take it that often. I asked him to keep a pressure log at home for the next two weeks to make sure his pressure is controlled. He will return to clinic in 2 weeks or present to his PCP for blood pressure check. He states he doesn't want to be "brain washed." I suspect he will continue to be noncompliant. - I also advised him to take coreg twice daily - he was only taking this daily - he is concerned about getting cold after imdur; I advised him to take this medication at night before bed  4. HLD - 51 on 06/2016. Will repeat fasting labs in 2 weeks    Medication Adjustments/Labs and Tests Ordered: Current medicines are reviewed at length with the patient today.  Concerns regarding medicines are outlined above.  Medication changes, Labs and Tests ordered today are listed in the Patient Instructions below. Patient Instructions  Medication Instructions:  1. REFILLS HAS BEEN SENT IN FOR ALL CARDIAC MEDICATIONS; BE SURE  TO TAKE THE COREG TWICE DAILY  Labwork: FASTING LIPID AND LIVER PANEL TO BE DONE IN 2 WEEKS   Testing/Procedures: NONE ORDERED TODAY  Follow-Up: 1. Your physician wants you to follow-up  in: 1 WITH DR. Emelda Fear will receive a reminder letter in the mail two months in advance. If you don't receive a letter, please call our office to schedule the follow-up appointment.  2. YOU WILL NEED TO BE SCHEDULED FOR A NURSE VISIT OR WITH THE HTN CLINIC FOR A BLOOD PRESSURE CHECK TO BE DONE IN 2 WEEKS, SAME DAY YOU WILL NEED FASTING LAB WORK AS WELL   Any Other Special Instructions Will Be Listed Below (If Applicable).     If you need a refill on your cardiac medications before your next appointment, please call your pharmacy.      Jarrett Soho, [place]  09/15/2017 3:45 PM    [facility] [address], [place], [place]  [address] Phone: [phone]; Fax: [phone]

## 2017-09-15 NOTE — Patient Instructions (Addendum)
Medication Instructions:  1. REFILLS HAS BEEN SENT IN FOR ALL CARDIAC MEDICATIONS; BE SURE TO TAKE THE COREG TWICE DAILY  Labwork: TODAY LIPID AND LIVER PANEL   Testing/Procedures: NONE ORDERED TODAY  Follow-Up: 1. Your physician wants you to follow-up in: 1 WITH DR. Emelda Fear will receive a reminder letter in the mail two months in advance. If you don't receive a letter, please call our office to schedule the follow-up appointment.  Any Other Special Instructions Will Be Listed Below (If Applicable). MONITOR YOUR BLOOD PRESSURE ONCE A DAY  FOLLOW UP WITH YOUR PRIMARY CARE PHYSICIAN ABOUT YOUR BLOOD PRESSURE   If you need a refill on your cardiac medications before your next appointment, please call your pharmacy.  Low-Sodium Eating Plan Sodium, which is an element that makes up salt, helps you maintain a healthy balance of fluids in your body. Too much sodium can increase your blood pressure and cause fluid and waste to be held in your body. Your health care provider or dietitian may recommend following this plan if you have high blood pressure (hypertension), kidney disease, liver disease, or heart failure. Eating less sodium can help lower your blood pressure, reduce swelling, and protect your heart, liver, and kidneys. What are tips for following this plan? General guidelines  Most people on this plan should limit their sodium intake to 1,500-2,000 mg (milligrams) of sodium each day. Reading food labels  The Nutrition Facts label lists the amount of sodium in one serving of the food. If you eat more than one serving, you must multiply the listed amount of sodium by the number of servings.  Choose foods with less than 140 mg of sodium per serving.  Avoid foods with 300 mg of sodium or more per serving. Shopping  Look for lower-sodium products, often labeled as "low-sodium" or "no salt added."  Always check the sodium content even if foods are labeled as "unsalted" or "no salt  added".  Buy fresh foods. ? Avoid canned foods and premade or frozen meals. ? Avoid canned, cured, or processed meats  Buy breads that have less than 80 mg of sodium per slice. Cooking  Eat more home-cooked food and less restaurant, buffet, and fast food.  Avoid adding salt when cooking. Use salt-free seasonings or herbs instead of table salt or sea salt. Check with your health care provider or pharmacist before using salt substitutes.  Cook with plant-based oils, such as canola, sunflower, or olive oil. Meal planning  When eating at a restaurant, ask that your food be prepared with less salt or no salt, if possible.  Avoid foods that contain MSG (monosodium glutamate). MSG is sometimes added to [place] food, bouillon, and some canned foods. What foods are recommended? The items listed may not be a complete list. Talk with your dietitian about what dietary choices are best for you. Grains Low-sodium cereals, including oats, puffed wheat and rice, and shredded wheat. Low-sodium crackers. Unsalted rice. Unsalted pasta. Low-sodium bread. Whole-grain breads and whole-grain pasta. Vegetables Fresh or frozen vegetables. "No salt added" canned vegetables. "No salt added" tomato sauce and paste. Low-sodium or reduced-sodium tomato and vegetable juice. Fruits Fresh, frozen, or canned fruit. Fruit juice. Meats and other protein foods Fresh or frozen (no salt added) meat, poultry, seafood, and fish. Low-sodium canned tuna and salmon. Unsalted nuts. Dried peas, beans, and lentils without added salt. Unsalted canned beans. Eggs. Unsalted nut butters. Dairy Milk. Soy milk. Cheese that is naturally low in sodium, such as ricotta cheese, fresh  mozzarella, or Swiss cheese Low-sodium or reduced-sodium cheese. Cream cheese. Yogurt. Fats and oils Unsalted butter. Unsalted margarine with no trans fat. Vegetable oils such as canola or olive oils. Seasonings and other foods Fresh and dried herbs and  spices. Salt-free seasonings. Low-sodium mustard and ketchup. Sodium-free salad dressing. Sodium-free light mayonnaise. Fresh or refrigerated horseradish. Lemon juice. Vinegar. Homemade, reduced-sodium, or low-sodium soups. Unsalted popcorn and pretzels. Low-salt or salt-free chips. What foods are not recommended? The items listed may not be a complete list. Talk with your dietitian about what dietary choices are best for you. Grains Instant hot cereals. Bread stuffing, pancake, and biscuit mixes. Croutons. Seasoned rice or pasta mixes. Noodle soup cups. Boxed or frozen macaroni and cheese. Regular salted crackers. Self-rising flour. Vegetables Sauerkraut, pickled vegetables, and relishes. Olives. [place] fries. Onion rings. Regular canned vegetables (not low-sodium or reduced-sodium). Regular canned tomato sauce and paste (not low-sodium or reduced-sodium). Regular tomato and vegetable juice (not low-sodium or reduced-sodium). Frozen vegetables in sauces. Meats and other protein foods Meat or fish that is salted, canned, smoked, spiced, or pickled. Bacon, ham, sausage, hotdogs, corned beef, chipped beef, packaged lunch meats, salt pork, jerky, pickled herring, anchovies, regular canned tuna, sardines, salted nuts. Dairy Processed cheese and cheese spreads. Cheese curds. Blue cheese. Feta cheese. String cheese. Regular cottage cheese. Buttermilk. Canned milk. Fats and oils Salted butter. Regular margarine. Ghee. Bacon fat. Seasonings and other foods Onion salt, garlic salt, seasoned salt, table salt, and sea salt. Canned and packaged gravies. Worcestershire sauce. Tartar sauce. Barbecue sauce. Teriyaki sauce. Soy sauce, including reduced-sodium. Steak sauce. Fish sauce. Oyster sauce. Cocktail sauce. Horseradish that you find on the shelf. Regular ketchup and mustard. Meat flavorings and tenderizers. Bouillon cubes. Hot sauce and Tabasco sauce. Premade or packaged marinades. Premade or packaged taco  seasonings. Relishes. Regular salad dressings. Salsa. Potato and tortilla chips. Corn chips and puffs. Salted popcorn and pretzels. Canned or dried soups. Pizza. Frozen entrees and pot pies. Summary  Eating less sodium can help lower your blood pressure, reduce swelling, and protect your heart, liver, and kidneys.  Most people on this plan should limit their sodium intake to 1,500-2,000 mg (milligrams) of sodium each day.  Canned, boxed, and frozen foods are high in sodium. Restaurant foods, fast foods, and pizza are also very high in sodium. You also get sodium by adding salt to food.  Try to cook at home, eat more fresh fruits and vegetables, and eat less fast food, canned, processed, or prepared foods. This information is not intended to replace advice given to you by your health care provider. Make sure you discuss any questions you have with your health care provider. Document Released: 02/07/2002 Document Revised: 08/11/2016 Document Reviewed: 08/11/2016 Elsevier Interactive Patient Education  [employer].

## 2017-09-16 LAB — LIPID PANEL
CHOLESTEROL TOTAL: 126 mg/dL (ref 100–199)
Chol/HDL Ratio: 3.4 ratio (ref 0.0–5.0)
HDL: 37 mg/dL — AB (ref >39)
LDL Calculated: 61 mg/dL (ref 0–99)
TRIGLYCERIDES: 139 mg/dL (ref 0–149)
VLDL CHOLESTEROL CAL: 28 mg/dL (ref 5–40)

## 2017-09-16 LAB — HEPATIC FUNCTION PANEL
ALK PHOS: 76 IU/L (ref 39–117)
ALT: 19 IU/L (ref 0–44)
AST: 19 IU/L (ref 0–40)
Albumin: 4.5 g/dL (ref 3.6–4.8)
Bilirubin Total: 0.9 mg/dL (ref 0.0–1.2)
Bilirubin, Direct: 0.24 mg/dL (ref 0.00–0.40)
TOTAL PROTEIN: 7.8 g/dL (ref 6.0–8.5)

## 2017-09-17 ENCOUNTER — Other Ambulatory Visit: Payer: Self-pay | Admitting: Endocrinology

## 2017-12-19 ENCOUNTER — Other Ambulatory Visit: Payer: Self-pay | Admitting: Endocrinology

## 2018-01-01 ENCOUNTER — Other Ambulatory Visit: Payer: Self-pay | Admitting: Endocrinology

## 2018-01-02 ENCOUNTER — Other Ambulatory Visit: Payer: Self-pay | Admitting: Endocrinology

## 2018-01-08 ENCOUNTER — Telehealth: Payer: Self-pay | Admitting: Cardiovascular Disease

## 2018-01-08 NOTE — Telephone Encounter (Signed)
Patients pcp has been refilling/managing this however he is overdue for an appointment with them and they have been refusing the refill requests from the pharmacy with a note that patient needs an appointment. Okay to refill or should this be deferred back to pcp? Please advise. Thanks, MI

## 2018-01-08 NOTE — Telephone Encounter (Signed)
New message      *STAT* If patient is at the pharmacy, call can be transferred to refill team.   1. Which medications need to be refilled? (please list name of each medication and dose if known) levothyroxine (SYNTHROID, LEVOTHROID) 125 MCG tablet  2. Which pharmacy/location (including street and city if local pharmacy) is medication to be sent to?[facility] [place], [place] - [address] AT  [address]  3. Do they need a 30 day or 90 day supply? [place]

## 2018-01-08 NOTE — Telephone Encounter (Signed)
Spoke with patient and made him aware of note below. He will contact prescribing physician.

## 2018-01-12 ENCOUNTER — Other Ambulatory Visit: Payer: Self-pay | Admitting: Endocrinology

## 2018-01-12 ENCOUNTER — Other Ambulatory Visit (INDEPENDENT_AMBULATORY_CARE_PROVIDER_SITE_OTHER): Payer: PPO

## 2018-01-12 DIAGNOSIS — E1165 Type 2 diabetes mellitus with hyperglycemia: Secondary | ICD-10-CM

## 2018-01-12 DIAGNOSIS — E063 Autoimmune thyroiditis: Secondary | ICD-10-CM

## 2018-01-12 LAB — BASIC METABOLIC PANEL
BUN: 26 mg/dL — ABNORMAL HIGH (ref 6–23)
CALCIUM: 9 mg/dL (ref 8.4–10.5)
CO2: 24 mEq/L (ref 19–32)
Chloride: 104 mEq/L (ref 96–112)
Creatinine, Ser: 1.43 mg/dL (ref 0.40–1.50)
GFR: 52.46 mL/min — ABNORMAL LOW (ref >60.00)
GLUCOSE: 138 mg/dL — AB (ref 70–99)
POTASSIUM: 4.4 meq/L (ref 3.5–5.1)
SODIUM: 136 meq/L (ref 135–145)

## 2018-01-12 LAB — HEMOGLOBIN A1C: Hgb A1c MFr Bld: 7.7 % — ABNORMAL HIGH (ref 4.6–6.5)

## 2018-01-12 LAB — T4, FREE: Free T4: 0.58 ng/dL — ABNORMAL LOW (ref 0.60–1.60)

## 2018-01-12 LAB — TSH: TSH: 16.75 u[IU]/mL — AB (ref 0.35–4.50)

## 2018-01-13 LAB — MICROALBUMIN / CREATININE URINE RATIO
Creatinine,U: 109.6 mg/dL
MICROALB UR: 69.9 mg/dL — AB (ref 0.0–1.9)
MICROALB/CREAT RATIO: 63.8 mg/g — AB (ref 0.0–30.0)

## 2018-01-14 ENCOUNTER — Encounter: Payer: Self-pay | Admitting: Endocrinology

## 2018-01-14 ENCOUNTER — Ambulatory Visit (INDEPENDENT_AMBULATORY_CARE_PROVIDER_SITE_OTHER): Payer: PPO | Admitting: Endocrinology

## 2018-01-14 VITALS — BP 140/88 | HR 86 | Ht 68.0 in | Wt 206.4 lb

## 2018-01-14 DIAGNOSIS — E039 Hypothyroidism, unspecified: Secondary | ICD-10-CM | POA: Diagnosis not present

## 2018-01-14 DIAGNOSIS — E1165 Type 2 diabetes mellitus with hyperglycemia: Secondary | ICD-10-CM | POA: Diagnosis not present

## 2018-01-14 DIAGNOSIS — I10 Essential (primary) hypertension: Secondary | ICD-10-CM | POA: Diagnosis not present

## 2018-01-14 DIAGNOSIS — E042 Nontoxic multinodular goiter: Secondary | ICD-10-CM | POA: Diagnosis not present

## 2018-01-14 MED ORDER — LEVOTHYROXINE SODIUM 150 MCG PO TABS: 150.0000 ug | ORAL_TABLET | Freq: Every day | ORAL | 3 refills | Status: DC

## 2018-01-14 MED ORDER — CANAGLIFLOZIN 100 MG PO TABS: ORAL_TABLET | ORAL | 3 refills | Status: DC

## 2018-01-14 NOTE — Progress Notes (Signed)
Patient ID: Nicolas Ray, male   DOB: Jan 01, 1951, 67 y.o.   MRN: [id number]            Reason for Appointment: Follow-up for diabetes and thyroid   History of Present Illness:          Date of diagnosis of type 2 diabetes mellitus: 2014      Background history:   At the time of diagnosis A1c was 8.6. [place] if he had any hyperglycemia then. He says that he started improving his diet subsequently and was able to improve his blood sugar control and lose weight. However his blood sugars have been overall still not well controlled and his A1c has been consistently over 7%. He had been on metformin but he claims it causes swelling of his legs and he stopped it in 2014, unclear how long he took this  Recent history:      His A1c is now 7.7, has not been checked since 11/17  Non-insulin hypoglycemic drugs the patient is taking are: Ja none, previously on nuvia 50 mg daily  Current management, blood sugar patterns and problems identified:  He stopped taking Januvia on his last visit because he was convinced that it was causing left shoulder pain.  He was prescribed Tradjenta but does not look like he started taking this  He again thinks that he is continuing to make changes in his diet with cutting back on sweets and fried food as well as trying to walk more regularly  However since last visit has not lost any weight  He refuses to check his sugar  He does not understand the need for A1c target of 7 or less  Again refusing to take any medications for diabetes because he thinks he is doing well enough with diet and exercise   Side effects from medications have been:?  Edema from metformin  Compliance with the medical regimen: Poor Hypoglycemia:    Glucose monitoring:  done 0 times a day         Glucometer:  Accu-Chek Blood Glucose readings none  Self-care: The diet that the patient has been following is: tries to limit sweets and fried  food    Typical meal intake: Breakfast is eggs and toast, eating more salads at times            Dietician visit, most recent:?  2015               Exercise:  walking up to 5 days a week  Weight history:  Wt Readings from Last 3 Encounters:  01/14/18 206 lb 6.4 oz (93.6 kg)  09/15/17 211 lb 6.4 oz (95.9 kg)  08/11/17 200 lb (90.7 kg)    Glycemic control:   Lab Results  Component Value Date   HGBA1C 7.7 (H) 01/12/2018   HGBA1C 7.8 (H) 07/18/2016   HGBA1C 8.5 (H) 04/18/2015   Lab Results  Component Value Date   MICROALBUR 69.9 (H) 01/12/2018   LDLCALC 61 09/15/2017   CREATININE 1.43 01/12/2018   Lab Results  Component Value Date   MICRALBCREAT 63.8 (H) 01/12/2018    Lab Results  Component Value Date   FRUCTOSAMINE 307 (H) 09/16/2016    THYROID disease: See review of systems    Allergies as of 01/14/2018   No Known Allergies     Medication List        Accurate as of 01/14/18 12:50 PM. Always use your most recent  med list.          aspirin EC 81 MG tablet Take 81 mg by mouth daily.   atorvastatin 20 MG tablet Commonly known as:  LIPITOR Take 1 tablet (20 mg total) by mouth daily.   canagliflozin 100 MG Tabs tablet Commonly known as:  INVOKANA 1 tablet before breakfast   carvedilol 12.5 MG tablet Commonly known as:  COREG Take 1 tablet (12.5 mg total) by mouth 2 (two) times daily with a meal.   clopidogrel 75 MG tablet Commonly known as:  PLAVIX Take 1 tablet (75 mg total) by mouth daily.   levothyroxine 150 MCG tablet Commonly known as:  SYNTHROID, LEVOTHROID Take 1 tablet (150 mcg total) by mouth daily.       Allergies: No Known Allergies  Past Medical History:  Diagnosis Date  . CAD (coronary artery disease) 04/18/2015   a. Lat STEMI 8/16 - LHC:  pLAD 90, mLAD 50, D1 100, dLCx 90, OM1 80, pRCA 75, EF 35-45% >> DES to D1;  b. staged PCI 04/20/15 - DES to mid LAD and DES to prox RCA  . Depression   . DM2 (diabetes mellitus, type 2)  ([place])   . HTN (hypertension) 02/08/2013   Renal Artery [name] 9/16: Bilateral RAS 1-59%, SMA >70%  . Hypothyroidism   . Ischemic cardiomyopathy    a. Echo 8/16:  Mild LVH, EF 40-45%, mid-apical anterolateral and apical AK, grade 1 diastolic dysfunction, trivial effusion    Past Surgical History:  Procedure Laterality Date  . CARDIAC CATHETERIZATION N/A 04/18/2015   Procedure: Left Heart Cath and Coronary Angiography;  Surgeon: Sherren Mocha, MD;  Location: [facility];  Service: Cardiovascular;  Laterality: N/A;  . CARDIAC CATHETERIZATION N/A 04/18/2015   Procedure: Coronary Stent Intervention;  Surgeon: Sherren Mocha, MD;  Location: [facility];  Service: Cardiovascular;  Laterality: N/A;  . CARDIAC CATHETERIZATION N/A 04/20/2015   Procedure: Coronary Stent Intervention;  Surgeon: Burnell Blanks, MD;  Location: [facility];  Service: Cardiovascular;  Laterality: N/A;  . CORONARY STENT PLACEMENT    . no prior surgery      Family History  Problem Relation Age of Onset  . Heart disease Mother   . Cancer Father   . Thyroid disease Sister   . Colon cancer Neg Hx   . Diabetes Neg Hx     Social History:  reports that he has been smoking cigarettes.  He has been smoking about 0.25 packs per day. He has never used smokeless tobacco. He reports that he drinks alcohol. He reports that he does not use drugs.   Review of Systems  Endocrine: Positive for erectile dysfunction.     Lipid history: On treatment with 20 mg Lipitor, has history of CAD, Followed by PCP    Lab Results  Component Value Date   CHOL 126 09/15/2017   HDL 37 (L) 09/15/2017   LDLCALC 61 09/15/2017   TRIG 139 09/15/2017   CHOLHDL 3.4 09/15/2017           Hypertension:Treated with carvedilol  only by his cardiologist  BP Readings from Last 3 Encounters:  01/14/18 140/88  09/15/17 (!) 148/90  08/11/17 (!) 171/94   Home BP recently 135/80.  Most recent eye exam was ? 4/18, report not  available  Most recent foot exam: 12/17  HYPOTHYROIDISM: On levothyroxine since about 2014. This has been progressively increased as TSH has been mostly high  He was told to increase the  dose to 125 g on the last visit but he did not follow-up subsequently He thinks he is taking his medication regularly except running out this week However even though he does not think he has unusual fatigue his TSH is still significantly high    Lab Results  Component Value Date   TSH 16.75 (H) 01/12/2018   TSH 18.20 (H) 09/16/2016   TSH 10.44 (H) 08/07/2016   FREET4 0.58 (L) 01/12/2018   FREET4 0.69 09/16/2016   FREET4 0.80 08/07/2016    GOITER: This was evaluated by ultrasound and he did not have any specific nodule, only a questionable nodule on the left thyroid. Has diffuse heterogenous enlargement  Previous clinical exam showed the following: He has a larger left lobe, nodular and measuring about 3-3.5 cm Right lobe is nodular with several small nodules and about twice normal size    LABS:  Lab on 01/12/2018  Component Date Value Ref Range Status  . Free T4 01/12/2018 0.58* 0.60 - 1.60 ng/dL Final   Comment: Specimens from patients who are undergoing biotin therapy and /or ingesting biotin supplements may contain high levels of biotin.  The higher biotin concentration in these specimens interferes with this Free T4 assay.  Specimens that contain high levels  of biotin may cause false high results for this Free T4 assay.  Please interpret results in light of the total clinical presentation of the patient.    Marland Kitchen TSH 01/12/2018 16.75* 0.35 - 4.50 uIU/mL Final  . Microalb, Ur 01/12/2018 69.9* 0.0 - 1.9 mg/dL Final  . Creatinine,U 01/12/2018 109.6  mg/dL Final  . Microalb Creat Ratio 01/12/2018 63.8* 0.0 - 30.0 mg/g Final  . Sodium 01/12/2018 136  135 - 145 mEq/L Final  . Potassium 01/12/2018 4.4  3.5 - 5.1 mEq/L Final  . Chloride 01/12/2018 104  96 - 112 mEq/L Final  . CO2 01/12/2018  24  19 - 32 mEq/L Final  . Glucose, Bld 01/12/2018 138* 70 - 99 mg/dL Final  . BUN 01/12/2018 26* 6 - 23 mg/dL Final  . Creatinine, Ser 01/12/2018 1.43  0.40 - 1.50 mg/dL Final  . Calcium 01/12/2018 9.0  8.4 - 10.5 mg/dL Final  . GFR 01/12/2018 52.46* >60.00 mL/min Final  . Hgb A1c MFr Bld 01/12/2018 7.7* 4.6 - 6.5 % Final   Glycemic Control Guidelines for People with Diabetes:Non Diabetic:  <6%Goal of Therapy: <7%Additional Action Suggested:  >8%     Physical Examination:  BP 140/88 (BP Location: Left Arm, Patient Position: Sitting, Cuff Size: Normal)   Pulse 86   Ht 5\' 8"  (1.727 m)   Wt 206 lb 6.4 oz (93.6 kg)   SpO2 98%   BMI 31.38 kg/m   He has a multinodular goiter with right lobe of his thyroid about 2 times normal, has significant nodularity Has a very firm nodule in the isthmus Left lobe of the thyroid is about 3 times normal and nodular  .  Biceps reflexes normal No peripheral edema      Repeat blood pressure 142/82     ASSESSMENT:  Diabetes type 2, uncontrolled  with persistently high A1c.BMI 32  See history of present illness for  discussion of current diabetes management, blood sugar patterns and problems identified  His A1c is 7.7 and still uncontrolled  He is not taking any medications that have been prescribed for his diabetes Also has not been seen in follow-up for well over a year  He likely has high postprandial readings since his fasting  glucose was 138 but A1c relatively high  Explained to the patient the A1c results, average blood sugar and implications for poor control with diabetes complication Discussed I6N target of 7% or less Also explained to him that despite his diet and exercise regimen he still has an adequate insulin production and action in his system and he needs a medication to help his control  He is a good candidate for an SGLT2 drug  MULTINODULAR goiter and primary hypothyroidism:  He has significant nodules on exam and a firm  nodule in the isthmus appears relatively new  Although he thinks he is subjectively doing well his TSH is still high at 16  PLAN:   Discussed action of SGLT 2 drugs on lowering glucose by decreasing kidney absorption of glucose, benefits of weight loss, new indication for cardiovascular risk reduction and benefits of lower blood pressure He will start with 100 mg daily  Although again he was recommended glucose monitoring he refused to do this  We will increase his Synthroid up to 150 mcg  Need to monitor blood pressure consistently at home and bring his monitor for comparison  Discussed that if he has lower blood pressure readings he will need to reduce his medications  Need follow-up in 6 weeks  Repeat thyroid ultrasound  More regular follow-up  Was explained to the patient that he does not need to take red rice yeast that he is asking about as he is already on a statin drug  There are no Patient Instructions on file for this visit.   Counseling time on subjects discussed in assessment and plan sections is over 50% of today's 25 minute visit  01/14/2018, 12:50 PM   Note: This office note was prepared with Estate agent. Any transcriptional errors that result from this process are unintentional.

## 2018-04-30 ENCOUNTER — Encounter (HOSPITAL_COMMUNITY): Payer: Self-pay | Admitting: Emergency Medicine

## 2018-04-30 ENCOUNTER — Emergency Department (HOSPITAL_COMMUNITY)
Admission: EM | Admit: 2018-04-30 | Discharge: 2018-04-30 | Disposition: A | Discharge status: Home / Self Care | Payer: PPO | Attending: Emergency Medicine | Admitting: Emergency Medicine

## 2018-04-30 ENCOUNTER — Other Ambulatory Visit: Payer: Self-pay

## 2018-04-30 DIAGNOSIS — I110 Hypertensive heart disease with heart failure: Secondary | ICD-10-CM | POA: Diagnosis not present

## 2018-04-30 DIAGNOSIS — E039 Hypothyroidism, unspecified: Secondary | ICD-10-CM | POA: Insufficient documentation

## 2018-04-30 DIAGNOSIS — I2510 Atherosclerotic heart disease of native coronary artery without angina pectoris: Secondary | ICD-10-CM | POA: Diagnosis not present

## 2018-04-30 DIAGNOSIS — E119 Type 2 diabetes mellitus without complications: Secondary | ICD-10-CM | POA: Insufficient documentation

## 2018-04-30 DIAGNOSIS — Z955 Presence of coronary angioplasty implant and graft: Secondary | ICD-10-CM | POA: Diagnosis not present

## 2018-04-30 DIAGNOSIS — M62838 Other muscle spasm: Secondary | ICD-10-CM

## 2018-04-30 DIAGNOSIS — Z79899 Other long term (current) drug therapy: Secondary | ICD-10-CM | POA: Diagnosis not present

## 2018-04-30 DIAGNOSIS — Z7982 Long term (current) use of aspirin: Secondary | ICD-10-CM | POA: Diagnosis not present

## 2018-04-30 DIAGNOSIS — I5022 Chronic systolic (congestive) heart failure: Secondary | ICD-10-CM | POA: Insufficient documentation

## 2018-04-30 DIAGNOSIS — F17210 Nicotine dependence, cigarettes, uncomplicated: Secondary | ICD-10-CM | POA: Diagnosis not present

## 2018-04-30 DIAGNOSIS — M542 Cervicalgia: Secondary | ICD-10-CM | POA: Diagnosis present

## 2018-04-30 MED ORDER — CYCLOBENZAPRINE HCL 10 MG PO TABS: 10.0000 mg | ORAL_TABLET | Freq: Two times a day (BID) | ORAL | 0 refills | Status: DC | PRN

## 2018-04-30 MED ORDER — CYCLOBENZAPRINE HCL 10 MG PO TABS
5.0000 mg | ORAL_TABLET | Freq: Once | ORAL | Status: AC
  Administered 2018-04-30: 5 mg via ORAL
  Filled 2018-04-30: qty 1

## 2018-04-30 NOTE — Discharge Instructions (Addendum)
Return to ED for worsening symptoms, injuries or falls, numbness in arms or legs, severe headache, chest pain or shortness of breath.

## 2018-04-30 NOTE — ED Triage Notes (Signed)
Patient reports intermittent posterior neck pressure radiating to right upper back this week denies injury or fall .

## 2018-04-30 NOTE — ED Notes (Signed)
Pt states he doesn't need to change into a gown because he is only being seen for his neck and shoulder.

## 2018-04-30 NOTE — ED Notes (Signed)
D/c reviewed with patient 

## 2018-04-30 NOTE — ED Provider Notes (Signed)
[facility] EMERGENCY DEPARTMENT Provider Note   CSN: [id number] Arrival date & time: 04/30/18  6433     History   Chief Complaint Chief Complaint  Patient presents with  . Neck Pain    HPI Nicolas Ray is a 67 y.o. male with a past medical history of CAD, T2DM, HTN, prior STEMI currently on Plavix, who presents to ED for evaluation of 3 day history of R posterior shoulder pain. Describes the pain as aching and worse with movement. Unsure if this is related to picking up heavy groceries a few days ago. States this has happened to him in the past. He is also unsure if this is related to "black fish oil pills."  Denies any injuries or falls, fever, stiffness of the neck, numbness or weakness of the arms, chest pain, shortness of breath, prior neck surgeries.  HPI  Past Medical History:  Diagnosis Date  . CAD (coronary artery disease) 04/18/2015   a. Lat STEMI 8/16 - LHC:  pLAD 90, mLAD 50, D1 100, dLCx 90, OM1 80, pRCA 75, EF 35-45% >> DES to D1;  b. staged PCI 04/20/15 - DES to mid LAD and DES to prox RCA  . Depression   . DM2 (diabetes mellitus, type 2) ([place])   . HTN (hypertension) 02/08/2013   Renal Artery [name] 9/16: Bilateral RAS 1-59%, SMA >70%  . Hypothyroidism   . Ischemic cardiomyopathy    a. Echo 8/16:  Mild LVH, EF 40-45%, mid-apical anterolateral and apical AK, grade 1 diastolic dysfunction, trivial effusion    Patient Active Problem List   Diagnosis Date Noted  . Coronary artery disease involving native coronary artery of native heart with unstable angina pectoris ([place])   . Type II diabetes mellitus with complication ([place]) 29/51/8841  . Acute systolic heart failure ([place]) 04/19/2015  . Acute MI, lateral wall, initial episode of care ([place]) 04/18/2015  . ST elevation myocardial infarction (STEMI) of lateral wall ([place]) 04/18/2015  . Other male erectile dysfunction 04/18/2014  . Diabetes ([place]) 10/13/2013  . Sciatica 03/15/2013  . Hypothyroidism 02/08/2013    . Essential hypertension 02/08/2013  . Depression 01/25/2013  . Low back pain 01/25/2013    Past Surgical History:  Procedure Laterality Date  . CARDIAC CATHETERIZATION N/A 04/18/2015   Procedure: Left Heart Cath and Coronary Angiography;  Surgeon: Sherren Mocha, MD;  Location: [facility];  Service: Cardiovascular;  Laterality: N/A;  . CARDIAC CATHETERIZATION N/A 04/18/2015   Procedure: Coronary Stent Intervention;  Surgeon: Sherren Mocha, MD;  Location: [facility];  Service: Cardiovascular;  Laterality: N/A;  . CARDIAC CATHETERIZATION N/A 04/20/2015   Procedure: Coronary Stent Intervention;  Surgeon: Burnell Blanks, MD;  Location: [facility];  Service: Cardiovascular;  Laterality: N/A;  . CORONARY STENT PLACEMENT    . no prior surgery          Home Medications    Prior to Admission medications   Medication Sig Start Date End Date Taking? Authorizing Provider  aspirin EC 81 MG tablet Take 81 mg by mouth daily.    [provider]  atorvastatin (LIPITOR) 20 MG tablet Take 1 tablet (20 mg total) by mouth daily. 09/15/17   Leanor Kail, PA  canagliflozin (INVOKANA) 100 MG TABS tablet 1 tablet before breakfast 01/14/18   Elayne Snare, MD  carvedilol (COREG) 12.5 MG tablet Take 1 tablet (12.5 mg total) by mouth 2 (two) times daily with a meal. 09/15/17   Leanor Kail, PA  clopidogrel (PLAVIX) 75 MG tablet Take 1 tablet (75 mg total) by mouth daily. 09/15/17   Bhagat, Crista Luria, PA  cyclobenzaprine (FLEXERIL) 10 MG tablet Take 1 tablet (10 mg total) by mouth 2 (two) times daily as needed for muscle spasms. 04/30/18   Jerre Vandrunen, PA-C  levothyroxine (SYNTHROID, LEVOTHROID) 150 MCG tablet Take 1 tablet (150 mcg total) by mouth daily. 01/14/18   Elayne Snare, MD    Family History Family History  Problem Relation Age of Onset  . Heart disease Mother   . Cancer Father   . Thyroid disease Sister   . Colon cancer Neg Hx   . Diabetes Neg Hx      Social History Social History   Tobacco Use  . Smoking status: Light Tobacco Smoker    Packs/day: 0.25    Types: Cigarettes  . Smokeless tobacco: Never Used  Substance Use Topics  . Alcohol use: Yes  . Drug use: No     Allergies   Patient has no known allergies.   Review of Systems Review of Systems  Constitutional: Negative for appetite change, chills and fever.  HENT: Negative for ear pain, rhinorrhea, sneezing and sore throat.   Eyes: Negative for photophobia and visual disturbance.  Respiratory: Negative for cough, chest tightness, shortness of breath and wheezing.   Cardiovascular: Negative for chest pain and palpitations.  Gastrointestinal: Negative for abdominal pain, blood in stool, constipation, diarrhea, nausea and vomiting.  Genitourinary: Negative for dysuria, hematuria and urgency.  Musculoskeletal: Positive for myalgias and neck pain.  Skin: Negative for rash.  Neurological: Negative for dizziness, weakness and light-headedness.     Physical Exam Updated Vital Signs BP (!) 167/92 (BP Location: Right Arm)   Pulse 72   Temp 97.6 F (36.4 C) (Oral)   Resp 16   SpO2 98%   Physical Exam  Constitutional: He appears well-developed and well-nourished. No distress.  HENT:  Head: Normocephalic and atraumatic.  Nose: Nose normal.  Eyes: Conjunctivae and EOM are normal. Right eye exhibits no discharge. Left eye exhibits no discharge. No scleral icterus.  Neck: Normal range of motion. Neck supple. Muscular tenderness present. No spinous process tenderness present. No neck rigidity. Normal range of motion present.    No meningismus. Full ROM of neck noted.  Cardiovascular: Normal rate, regular rhythm, normal heart sounds and intact distal pulses. Exam reveals no gallop and no friction rub.  No murmur heard. Pulmonary/Chest: Effort normal and breath sounds normal. No respiratory distress.  Abdominal: Soft. Bowel sounds are normal. He exhibits no  distension. There is no tenderness. There is no guarding.  Musculoskeletal: Normal range of motion. He exhibits no edema.  Neurological: He is alert. He exhibits normal muscle tone. Coordination normal.  Equal grip strength bilaterally. Strength 5/5 in BUE.  Skin: Skin is warm and dry. No rash noted.  Psychiatric: He has a normal mood and affect.  Nursing note and vitals reviewed.    ED Treatments / Results  Labs (all labs ordered are listed, but only abnormal results are displayed) Labs Reviewed - No data to display  EKG None  Radiology No results found.  Procedures Procedures (including critical care time)  Medications Ordered in ED Medications - No data to display   Initial Impression / Assessment and Plan / ED Course  I have reviewed the triage vital signs and the nursing notes.  Pertinent labs & imaging results that were available during my care of the patient were reviewed by me and considered in my  medical decision making (see chart for details).     67 year old male with past medical history of CAD, type 2 diabetes, hypertension, currently on Plavix who presents to ED for evaluation of 3-day history of right posterior shoulder pain.  Pain is aching and worse with movement.  He is unsure if this is related to the groceries that he picked up a few days ago.  He has had this pain before.  Denies any injuries or falls, fever, stiffness of the neck, numbness or weakness, chest pain or shortness of breath.  On exam there is tenderness palpation of the trapezius musculature with muscle spasm seen.  Doubt neurological, cardiac cause of symptoms.  No neurological deficits noted on exam.  No meningismus noted.  He is afebrile.  Doubt infectious cause of symptoms.  Will give Flexeril and advised him to follow-up with PCP for further evaluation.  Advised to return to ED for any severe worsening symptoms. Patient discussed with and seen by my attending, Dr. Sherry Ruffing.  Portions of this  note were generated with Lobbyist. Dictation errors may occur despite best attempts at proofreading.   Final Clinical Impressions(s) / ED Diagnoses   Final diagnoses:  Trapezius muscle spasm    ED Discharge Orders         Ordered    cyclobenzaprine (FLEXERIL) 10 MG tablet  2 times daily PRN     04/30/18 0727           Delia Heady, PA-C 04/30/18 6811    Tegeler, Gwenyth Allegra, MD 04/30/18 1150

## 2018-05-01 DIAGNOSIS — S46811A Strain of other muscles, fascia and tendons at shoulder and upper arm level, right arm, initial encounter: Secondary | ICD-10-CM | POA: Diagnosis not present

## 2018-05-07 DIAGNOSIS — F17200 Nicotine dependence, unspecified, uncomplicated: Secondary | ICD-10-CM | POA: Diagnosis not present

## 2018-05-07 DIAGNOSIS — M5412 Radiculopathy, cervical region: Secondary | ICD-10-CM | POA: Diagnosis not present

## 2018-05-07 DIAGNOSIS — Z955 Presence of coronary angioplasty implant and graft: Secondary | ICD-10-CM | POA: Diagnosis not present

## 2018-05-07 DIAGNOSIS — E785 Hyperlipidemia, unspecified: Secondary | ICD-10-CM | POA: Diagnosis not present

## 2018-05-07 DIAGNOSIS — I119 Hypertensive heart disease without heart failure: Secondary | ICD-10-CM | POA: Diagnosis not present

## 2018-05-27 DIAGNOSIS — M5412 Radiculopathy, cervical region: Secondary | ICD-10-CM | POA: Diagnosis not present

## 2018-06-22 DIAGNOSIS — E039 Hypothyroidism, unspecified: Secondary | ICD-10-CM | POA: Diagnosis not present

## 2018-06-22 DIAGNOSIS — J209 Acute bronchitis, unspecified: Secondary | ICD-10-CM | POA: Diagnosis not present

## 2018-06-22 DIAGNOSIS — Z0000 Encounter for general adult medical examination without abnormal findings: Secondary | ICD-10-CM | POA: Diagnosis not present

## 2018-06-22 DIAGNOSIS — E1165 Type 2 diabetes mellitus with hyperglycemia: Secondary | ICD-10-CM | POA: Diagnosis not present

## 2018-07-20 DIAGNOSIS — Z87891 Personal history of nicotine dependence: Secondary | ICD-10-CM | POA: Diagnosis not present

## 2018-07-20 DIAGNOSIS — E1165 Type 2 diabetes mellitus with hyperglycemia: Secondary | ICD-10-CM | POA: Diagnosis not present

## 2018-07-20 DIAGNOSIS — M5412 Radiculopathy, cervical region: Secondary | ICD-10-CM | POA: Diagnosis not present

## 2018-07-20 DIAGNOSIS — E039 Hypothyroidism, unspecified: Secondary | ICD-10-CM | POA: Diagnosis not present

## 2018-07-20 DIAGNOSIS — R21 Rash and other nonspecific skin eruption: Secondary | ICD-10-CM | POA: Diagnosis not present

## 2018-07-21 ENCOUNTER — Other Ambulatory Visit: Payer: Self-pay | Admitting: Cardiovascular Disease

## 2018-08-04 ENCOUNTER — Ambulatory Visit (INDEPENDENT_AMBULATORY_CARE_PROVIDER_SITE_OTHER): Payer: PPO | Admitting: Pulmonary Disease

## 2018-08-04 ENCOUNTER — Encounter: Payer: Self-pay | Admitting: Pulmonary Disease

## 2018-08-04 VITALS — BP 124/84 | HR 64 | Ht 68.0 in | Wt 199.8 lb

## 2018-08-04 DIAGNOSIS — R059 Cough, unspecified: Secondary | ICD-10-CM

## 2018-08-04 DIAGNOSIS — R05 Cough: Secondary | ICD-10-CM | POA: Diagnosis not present

## 2018-08-04 DIAGNOSIS — R0683 Snoring: Secondary | ICD-10-CM | POA: Diagnosis not present

## 2018-08-04 NOTE — Patient Instructions (Signed)
Will schedule chest xray, pulmonary function test, and home sleep study  Mucinex twice per day as needed to help with chest congestion  Follow up in 4 weeks

## 2018-08-04 NOTE — Progress Notes (Signed)
[facility]  Chief Complaint  Patient presents with  . Consult    States he is SOB whe lies down flat on his back. States he does not notice it any other time.     Constitutional:  BP 124/84   Pulse 64   Ht 5\' 8"  (1.727 m)   Wt 199 lb 12.8 oz (90.6 kg)   SpO2 96%   BMI 30.38 kg/m   Past Medical History:  CAD, DM, Depression, HTN, Hypothyroidism, Ischemic CM, HLD, Vitamin D deficiency  Brief Summary:  Nicolas Ray is a 67 y.o. male with shortness of breath when laying flat.  He has history of CAD and followed by Dr. Burt Knack.  He did a lot of lifestyle modification and felt that his health improved.  He was uncertain why he needed to continue taking so many medications.  He noticed that when he is asleep and lays flat on his back he feels like he can't breath and his breathing gets heavy.  When he sits up or sleeps on his side, then he feels okay.  He is concerned this could be related to isosorbide so he stopped this.  He also had one day when he ate a large quantity of nuts and was worried this could have caused his symptoms.  He does snore.  He will sometimes get mucus in his airways, and cough up small amount of phlegm.  He is unaware about whether he stops breathing at night.  He is not having wheeze, or shortness of breathing otherwise.  No history of asthma, pneumonia, or TB.     Physical Exam:   Appearance - well kempt   ENMT - clear nasal mucosa, midline nasal  septum, no oral exudates, no LAN, trachea midline  Respiratory - normal chest wall, normal respiratory effort, no accessory muscle use, no wheeze/rales  CV - s1s2 regular rate and rhythm, no murmurs, no peripheral edema, radial pulses symmetric  GI - soft, non tender, no masses  Lymph - no adenopathy noted in neck and axillary areas  MSK - normal gait  Ext - no cyanosis, clubbing, or joint inflammation noted  Skin - no rashes, lesions, or ulcers  Neuro - normal  strength, oriented x 3  Psych - normal mood and affect  Discussion:  His symptoms description is suggestive of sleep apnea.  He does have history of hypertension, CAD, and systolic CHF.  He is still concerned he could have an additional pulmonary cause to his symptoms.  He is also concerned his symptoms could be related to medication side effects.  Assessment/Plan:   Snoring. - will arrange for home sleep study to assess for sleep apnea  Mucus production. - will arrange for CXR and PFT to further assess - he can try prn mucinex for now  CAD, HTN, CHF. - advised him to d/w Dr. Burt Knack before stopping any of his cardiac medications    Patient Instructions  Will schedule chest xray, pulmonary function test, and home sleep study  Mucinex twice per day as needed to help with chest congestion  Follow up in 4 weeks    [name], MD [place] Pager: [phone] 08/04/2018, 5:23 PM  Flow Sheet     Pulmonary tests:    Sleep tests:    Cardiac tests:  Echo 12/31/15 >> mod LVH, EF 35 to 40$, grade 1 DD  Review of Systems:  Reviewed and negative.  Medications:   Allergies as of 08/04/2018  No Known Allergies     Medication List        Accurate as of 08/04/18  5:23 PM. Always use your most recent med list.          aspirin EC 81 MG tablet Take 81 mg by mouth daily.   atorvastatin 20 MG tablet Commonly known as:  LIPITOR Take 1 tablet (20 mg total) by mouth daily.   carvedilol 12.5 MG tablet Commonly known as:  COREG Take 1 tablet (12.5 mg total) by mouth 2 (two) times daily with a meal. Please make appt with Dr. Burt Knack for January. 1st attempt   clopidogrel 75 MG tablet Commonly known as:  PLAVIX Take 1 tablet (75 mg total) by mouth daily.   cyclobenzaprine 10 MG tablet Commonly known as:  FLEXERIL Take 1 tablet (10 mg total) by mouth 2 (two) times daily as needed for muscle spasms.   isosorbide mononitrate 15 mg Tb24 24 hr  tablet Commonly known as:  IMDUR Take 7.5 mg by mouth daily.   levothyroxine 150 MCG tablet Commonly known as:  SYNTHROID, LEVOTHROID Take 1 tablet (150 mcg total) by mouth daily.   Vitamin D3 125 MCG (5000 UT) Caps Take 5,000 Units by mouth daily.       Past Surgical History:  He  has a past surgical history that includes no prior surgery; Cardiac catheterization (N/A, 04/18/2015); Cardiac catheterization (N/A, 04/18/2015); Cardiac catheterization (N/A, 04/20/2015); and Coronary stent placement.  Family History:  His family history includes Cancer in his father; Heart disease in his mother; Thyroid disease in his sister.  Social History:  He  reports that he has been smoking cigarettes. He has been smoking about 0.25 packs per day. He has never used smokeless tobacco. He reports that he drinks alcohol. He reports that he does not use drugs.

## 2018-08-05 ENCOUNTER — Ambulatory Visit (INDEPENDENT_AMBULATORY_CARE_PROVIDER_SITE_OTHER)
Admission: RE | Admit: 2018-08-05 | Discharge: 2018-08-05 | Disposition: A | Discharge status: Home / Self Care | Payer: PPO | Source: Ambulatory Visit | Attending: Pulmonary Disease | Admitting: Pulmonary Disease

## 2018-08-05 DIAGNOSIS — R05 Cough: Secondary | ICD-10-CM

## 2018-08-05 DIAGNOSIS — R0600 Dyspnea, unspecified: Secondary | ICD-10-CM | POA: Diagnosis not present

## 2018-08-05 DIAGNOSIS — R059 Cough, unspecified: Secondary | ICD-10-CM

## 2018-08-06 ENCOUNTER — Telehealth: Payer: Self-pay | Admitting: Pulmonary Disease

## 2018-08-06 DIAGNOSIS — R059 Cough, unspecified: Secondary | ICD-10-CM

## 2018-08-06 DIAGNOSIS — R05 Cough: Secondary | ICD-10-CM

## 2018-08-06 DIAGNOSIS — R0602 Shortness of breath: Secondary | ICD-10-CM

## 2018-08-06 NOTE — Telephone Encounter (Signed)
Attempted to call patient today regarding results. I did not receive an answer at time of call. I have left a voicemail message for pt to return call. X1  

## 2018-08-06 NOTE — Telephone Encounter (Signed)
Dg Chest 2 View  Result Date: 08/05/2018 CLINICAL DATA:  Night-time dyspnea. EXAM: CHEST - 2 VIEW COMPARISON:  None. FINDINGS: Cardiomediastinal silhouette is normal. Mediastinal contours appear intact. Calcific atherosclerotic disease and tortuosity of the aorta. Chronic elevation of the right hemidiaphragm. There is no evidence of focal airspace consolidation, pleural effusion or pneumothorax. Osseous structures are without acute abnormality. Soft tissues are grossly normal. IMPRESSION: No active cardiopulmonary disease. Calcific atherosclerotic disease of the aorta. Electronically Signed   By: Fidela Salisbury M.D.   On: 08/05/2018 15:36    Please let him know that his CXR shows elevation of his Rt diaphragm.  This could be contributing to his feeling short of breath when laying flat.  He needs to have another xray (SNIFF test) to further assess if he has problems with his diaphragm movement.  Please schedule SNIFF test if he is okay with proceeding with test.

## 2018-08-16 NOTE — Telephone Encounter (Signed)
Attempted to contact pt. Call went straight to voicemail. I have left a message for pt to return our call.  

## 2018-08-18 NOTE — Telephone Encounter (Signed)
Called and spoke with patient regarding results.  Informed the patient of results and recommendations today. Placed order for SNIFF test today. Pt verbalized understanding and denied any questions or concerns at this time.  Nothing further needed.

## 2018-09-07 ENCOUNTER — Ambulatory Visit (HOSPITAL_COMMUNITY)
Admission: RE | Admit: 2018-09-07 | Discharge: 2018-09-07 | Disposition: A | Discharge status: Home / Self Care | Payer: PPO | Source: Ambulatory Visit | Attending: Pulmonary Disease | Admitting: Pulmonary Disease

## 2018-09-07 DIAGNOSIS — R0602 Shortness of breath: Secondary | ICD-10-CM | POA: Diagnosis not present

## 2018-09-07 DIAGNOSIS — R05 Cough: Secondary | ICD-10-CM | POA: Diagnosis not present

## 2018-09-07 DIAGNOSIS — J986 Disorders of diaphragm: Secondary | ICD-10-CM | POA: Diagnosis not present

## 2018-09-07 DIAGNOSIS — R059 Cough, unspecified: Secondary | ICD-10-CM

## 2018-09-10 ENCOUNTER — Ambulatory Visit (INDEPENDENT_AMBULATORY_CARE_PROVIDER_SITE_OTHER): Payer: PPO | Admitting: Pulmonary Disease

## 2018-09-10 ENCOUNTER — Encounter: Payer: Self-pay | Admitting: Pulmonary Disease

## 2018-09-10 VITALS — BP 124/74 | HR 74 | Ht 68.0 in | Wt 203.0 lb

## 2018-09-10 DIAGNOSIS — J986 Disorders of diaphragm: Secondary | ICD-10-CM | POA: Diagnosis not present

## 2018-09-10 DIAGNOSIS — R059 Cough, unspecified: Secondary | ICD-10-CM

## 2018-09-10 DIAGNOSIS — R05 Cough: Secondary | ICD-10-CM

## 2018-09-10 NOTE — Patient Instructions (Signed)
Will make sure you schedule sleep study  Will schedule follow up after sleep study reviewed

## 2018-09-10 NOTE — Progress Notes (Signed)
PFT was attempted today but could not be completed due to the patient being unable to perform the proper technique of Spirometry and DLCO.   I spoke with Dr. Halford Chessman and let him know what was going on. I offered to keep trying with the patient but he stated to not worry about it and end the test.

## 2018-09-10 NOTE — Progress Notes (Signed)
[facility]  Chief Complaint  Patient presents with  . Follow-up    Pt was unable to completed PFT prior to ov today. Pt still has cough, he states no changes.    Constitutional:  BP 124/74 (BP Location: Left Arm, Cuff Size: Normal)   Pulse 74   Ht 5\' 8"  (1.727 m)   Wt 203 lb (92.1 kg)   SpO2 96%   BMI 30.87 kg/m   Past Medical History:  CAD, DM, Depression, HTN, Hypothyroidism, Ischemic CM, HLD, Vitamin D deficiency  Brief Summary:  Nicolas Ray is a 68 y.o. male with shortness of breath when laying flat.  He had SNIFF test.  Showed paralysis of Rt hemidiaphragm.  He remembers few months ago having neck and upper back pain.  He tried massage therapy at that time.  He still gets short of breath laying flat in certain positions.  He doesn't understand how this could have happened to him.   Physical Exam:   Appearance - well kempt   ENMT - no sinus tenderness, no nasal discharge, no oral exudate  Neck - no masses, trachea midline, no thyromegaly, no elevation in JVP  Respiratory - normal appearance of chest wall, normal respiratory effort w/o accessory muscle use, no dullness on percussion, no wheezing or rales  CV - s1s2 regular rate and rhythm, no murmurs, no peripheral edema, radial pulses symmetric  GI - soft, non tender  Lymph - no adenopathy noted in neck and axillary areas  MSK - normal gait  Ext - no cyanosis, clubbing, or joint inflammation noted  Skin - no rashes, lesions, or ulcers  Neuro - normal strength, oriented x 3  Psych - normal mood and affect    Assessment/Plan:   Snoring. - he has finally agreed to having home sleep study  Right diaphragm paralysis. - I spent an extend period of time discussing with him the impact of this regarding his breathing - unfortunately he had trouble understanding test instructions during PFT, so I explained to him that it is more difficult to quantify how much this is  impacting his lung function  CAD, HTN, CHF. - f/u with Dr. Burt Knack with cardiology    Patient Instructions  Will make sure you schedule sleep study  Will schedule follow up after sleep study reviewed  Time spent 28 minutes with > 50% face time  [name], MD [place] Pager: [phone] 09/10/2018, 3:40 PM  Flow Sheet     Pulmonary tests:  SNIFF test 09/07/18 >> paralysis Rt hemidiaphragm  Sleep tests:    Cardiac tests:  Echo 12/31/15 >> mod LVH, EF 35 to 40%, grade 1 DD  Medications:   Allergies as of 09/10/2018   No Known Allergies     Medication List       Accurate as of September 10, 2018  3:40 PM. Always use your most recent med list.        aspirin EC 81 MG tablet Take 81 mg by mouth daily.   atorvastatin 20 MG tablet Commonly known as:  LIPITOR Take 1 tablet (20 mg total) by mouth daily.   carvedilol 12.5 MG tablet Commonly known as:  COREG Take 1 tablet (12.5 mg total) by mouth 2 (two) times daily with a meal. Please make appt with Dr. Burt Knack for January. 1st attempt   clopidogrel 75 MG tablet Commonly known as:  PLAVIX Take 1 tablet (75 mg total) by mouth daily.   cyclobenzaprine  10 MG tablet Commonly known as:  FLEXERIL Take 1 tablet (10 mg total) by mouth 2 (two) times daily as needed for muscle spasms.   isosorbide mononitrate 15 mg Tb24 24 hr tablet Commonly known as:  IMDUR Take 7.5 mg by mouth daily.   levothyroxine 150 MCG tablet Commonly known as:  SYNTHROID, LEVOTHROID Take 1 tablet (150 mcg total) by mouth daily.   Vitamin D3 125 MCG (5000 UT) Caps Take 5,000 Units by mouth daily.       Past Surgical History:  He  has a past surgical history that includes no prior surgery; Cardiac catheterization (N/A, 04/18/2015); Cardiac catheterization (N/A, 04/18/2015); Cardiac catheterization (N/A, 04/20/2015); and Coronary stent placement.  Family History:  His family history includes Cancer in his father; Heart  disease in his mother; Thyroid disease in his sister.  Social History:  He  reports that he has been smoking cigarettes. He has been smoking about 0.25 packs per day. He has never used smokeless tobacco. He reports current alcohol use. He reports that he does not use drugs.

## 2018-09-17 ENCOUNTER — Telehealth: Payer: Self-pay | Admitting: Pulmonary Disease

## 2018-09-17 NOTE — Telephone Encounter (Signed)
[facility] noted that patient came in to pick up sleep machine for HST Pt states he felt he didnt have any sleep issues and that he only had some breathing issues when lying flat on his back he didnt want to do the study but did decide to take it home and do the study Nothing further needed.  Routing message to VS for FYI.

## 2018-09-17 NOTE — Telephone Encounter (Signed)
Noted  

## 2018-09-20 ENCOUNTER — Other Ambulatory Visit: Payer: Self-pay | Admitting: *Deleted

## 2018-09-20 DIAGNOSIS — G4733 Obstructive sleep apnea (adult) (pediatric): Secondary | ICD-10-CM | POA: Diagnosis not present

## 2018-09-20 DIAGNOSIS — R0683 Snoring: Secondary | ICD-10-CM

## 2018-09-21 ENCOUNTER — Telehealth: Payer: Self-pay | Admitting: Pulmonary Disease

## 2018-09-21 ENCOUNTER — Encounter: Payer: Self-pay | Admitting: Pulmonary Disease

## 2018-09-21 DIAGNOSIS — G4733 Obstructive sleep apnea (adult) (pediatric): Secondary | ICD-10-CM | POA: Diagnosis not present

## 2018-09-21 HISTORY — DX: Obstructive sleep apnea (adult) (pediatric): G47.33

## 2018-09-21 NOTE — Telephone Encounter (Signed)
HST 09/20/18 >> AHI 10.9, SpO2 low 78%.  Spent 253.8 minutes of test time with SpO2 < 90%.   Please let him know that the sleep study showed mild obstructive sleep apnea and very low oxygen levels at night.  Please arrange for ROV with me to review treatment options.  Needs to be 30 minute slot.

## 2018-09-21 NOTE — Telephone Encounter (Signed)
Attempted to call patient today regarding results. I did not receive an answer at time of call. I have left a voicemail message for pt to return call. X1  

## 2018-09-22 NOTE — Telephone Encounter (Signed)
Called and spoke with patient regarding results.  Informed the patient of results and recommendations today. Scheduled rov with VS 09/24/2018 at 3:30pm. Pt verbalized understanding and denied any questions or concerns at this time.  Nothing further needed.

## 2018-09-24 ENCOUNTER — Encounter: Payer: Self-pay | Admitting: Pulmonary Disease

## 2018-09-24 ENCOUNTER — Ambulatory Visit (INDEPENDENT_AMBULATORY_CARE_PROVIDER_SITE_OTHER): Payer: PPO | Admitting: Pulmonary Disease

## 2018-09-24 VITALS — BP 130/80 | HR 81 | Ht 68.0 in | Wt 203.0 lb

## 2018-09-24 DIAGNOSIS — R0602 Shortness of breath: Secondary | ICD-10-CM | POA: Diagnosis not present

## 2018-09-24 DIAGNOSIS — G4733 Obstructive sleep apnea (adult) (pediatric): Secondary | ICD-10-CM

## 2018-09-24 DIAGNOSIS — J986 Disorders of diaphragm: Secondary | ICD-10-CM | POA: Diagnosis not present

## 2018-09-24 NOTE — Progress Notes (Signed)
[facility]  Chief Complaint  Patient presents with  . Follow-up    Here for HST results.    Constitutional:  BP 130/80 (BP Location: Left Arm, Cuff Size: Normal)   Pulse 81   Ht 5\' 8"  (1.727 m)   Wt 203 lb (92.1 kg)   SpO2 95%   BMI 30.87 kg/m   Past Medical History:  CAD, DM, Depression, HTN, Hypothyroidism, Ischemic CM, HLD, Vitamin D deficiency  Brief Summary:  Nicolas Ray is a 68 y.o. male with obstructive sleep apnea and diaphragm dysfunction.  He had home sleep study.  Showed mild sleep apnea with low oxygen.  He wasn't able to do PFT.  He is shocked by findings and has a hard time understanding what this all means.  He was hopeful that he could just do some breathing exercises and change his diet, and then his breathing would get better.    Physical Exam:   Appearance - well kempt   ENMT - no sinus tenderness, no nasal discharge, no oral exudate  Neck - no masses, trachea midline, no thyromegaly, no elevation in JVP  Respiratory - normal appearance of chest wall, normal respiratory effort w/o accessory muscle use, no dullness on percussion, no wheezing or rales  CV - s1s2 regular rate and rhythm, no murmurs, no peripheral edema, radial pulses symmetric  GI - soft, non tender  Lymph - no adenopathy noted in neck and axillary areas  MSK - normal gait  Ext - no cyanosis, clubbing, or joint inflammation noted  Skin - no rashes, lesions, or ulcers  Neuro - normal strength, oriented x 3  Psych - normal mood and affect   Assessment/Plan:   Sleep disordered breathing. - from obstructive sleep apnea and hypoventilation in setting of diaphragm dysfunction - I had an extremely lengthy discussion with him about sleep study findings and what these findings mean - he is still not convinced he has these problems - he would like to take a few days to think about whether he would want to try CPAP - if he does decide to try  CPAP, then he would need auto CPAP and have ONO on CPAP  CAD, HTN, CHF. - he is followed by Dr. Burt Knack with cardiology  Time spent 38 minutes with > 50% face time.    Patient Instructions  Call if you want to set up a CPAP machine  Follow up in 2 months    [name], MD [place] Pager: [phone] 09/24/2018, 5:19 PM  Flow Sheet     Pulmonary tests:  SNIFF test 09/07/18 >> paralysis Rt hemidiaphragm  Sleep tests:  HST 09/20/18 >> AHI 10.9, SpO2 low 78%.  Spent 253.8 minutes of test time with SpO2 < 90%.  Cardiac tests:  Echo 12/31/15 >> mod LVH, EF 35 to 40$, grade 1 DD  Medications:   Allergies as of 09/24/2018   No Known Allergies     Medication List       Accurate as of September 24, 2018  5:19 PM. Always use your most recent med list.        aspirin EC 81 MG tablet Take 81 mg by mouth daily.   atorvastatin 20 MG tablet Commonly known as:  LIPITOR Take 1 tablet (20 mg total) by mouth daily.   carvedilol 12.5 MG tablet Commonly known as:  COREG Take 1 tablet (12.5 mg total) by mouth 2 (two) times daily with a meal. Please  make appt with Dr. Burt Knack for January. 1st attempt   clopidogrel 75 MG tablet Commonly known as:  PLAVIX Take 1 tablet (75 mg total) by mouth daily.   cyclobenzaprine 10 MG tablet Commonly known as:  FLEXERIL Take 1 tablet (10 mg total) by mouth 2 (two) times daily as needed for muscle spasms.   isosorbide mononitrate 15 mg Tb24 24 hr tablet Commonly known as:  IMDUR Take 7.5 mg by mouth daily.   levothyroxine 150 MCG tablet Commonly known as:  SYNTHROID, LEVOTHROID Take 1 tablet (150 mcg total) by mouth daily.   Vitamin D3 125 MCG (5000 UT) Caps Take 5,000 Units by mouth daily.       Past Surgical History:  He  has a past surgical history that includes no prior surgery; Cardiac catheterization (N/A, 04/18/2015); Cardiac catheterization (N/A, 04/18/2015); Cardiac catheterization (N/A, 04/20/2015); and  Coronary stent placement.  Family History:  His family history includes Cancer in his father; Heart disease in his mother; Thyroid disease in his sister.  Social History:  He  reports that he has been smoking cigarettes. He has been smoking about 0.25 packs per day. He has never used smokeless tobacco. He reports current alcohol use. He reports that he does not use drugs.

## 2018-09-24 NOTE — Patient Instructions (Signed)
Call if you want to set up a CPAP machine  Follow up in 2 months

## 2018-10-12 ENCOUNTER — Other Ambulatory Visit: Payer: Self-pay | Admitting: Physician Assistant

## 2018-11-14 ENCOUNTER — Other Ambulatory Visit: Payer: Self-pay | Admitting: Physician Assistant

## 2018-11-15 ENCOUNTER — Other Ambulatory Visit: Payer: Self-pay | Admitting: Physician Assistant

## 2018-11-17 ENCOUNTER — Other Ambulatory Visit: Payer: Self-pay | Admitting: Physician Assistant

## 2019-01-14 ENCOUNTER — Other Ambulatory Visit: Payer: Self-pay | Admitting: Endocrinology

## 2019-01-15 ENCOUNTER — Other Ambulatory Visit: Payer: Self-pay | Admitting: Endocrinology

## 2019-02-09 ENCOUNTER — Other Ambulatory Visit: Payer: Self-pay | Admitting: Endocrinology

## 2019-02-09 ENCOUNTER — Other Ambulatory Visit: Payer: Self-pay | Admitting: Physician Assistant

## 2019-02-10 DIAGNOSIS — E039 Hypothyroidism, unspecified: Secondary | ICD-10-CM | POA: Diagnosis not present

## 2019-02-10 DIAGNOSIS — E7800 Pure hypercholesterolemia, unspecified: Secondary | ICD-10-CM | POA: Diagnosis not present

## 2019-02-10 DIAGNOSIS — E1165 Type 2 diabetes mellitus with hyperglycemia: Secondary | ICD-10-CM | POA: Diagnosis not present

## 2019-02-10 DIAGNOSIS — Z79899 Other long term (current) drug therapy: Secondary | ICD-10-CM | POA: Diagnosis not present

## 2019-03-08 ENCOUNTER — Telehealth: Payer: Self-pay | Admitting: Cardiovascular Disease

## 2019-03-08 NOTE — Telephone Encounter (Signed)
  Patient would like to know if he needs to get lab work done and if so can he come in before is appt on 03/29/19 so that they will have his results to go over with him. Please call and let him. Can leave detailed message on voicemail

## 2019-03-28 ENCOUNTER — Telehealth: Payer: Self-pay | Admitting: Physician Assistant

## 2019-03-28 NOTE — Progress Notes (Signed)
Cardiology Office Note:    Date:  03/29/2019   ID:  Nicolas Ray, DOB Apr 17, 1951, MRN [id number]  PCP:  Sandi Mariscal, MD  Cardiologist:  Sherren Mocha, MD   Electrophysiologist:  None  Pulmonologist: [name], MD  Referring MD: Sandi Mariscal, MD   Chief Complaint  Patient presents with  . Follow-up    CAD     History of Present Illness:    Nicolas Ray is a 68 y.o. male with:  Coronary artery disease   S/p Lat STEMI 8/16 >> s/p DES to D1  Patient refused CABG >> staged PCI: DES to mid LAD and DES to prox RCA  Chronic systolic CHF  Ischemic CM  Echocardiogram 8/16: EF 40-45  Echocardiogram 5/17: EF 35-40  Diabetes mellitus   Chronic kidney disease 3  Hypertension   Medical non-adherence   Tobacco use  R hemidiaphragm paralysis  Mild OSA   Nicolas Ray returns for follow up.  He was dx with R hemidiaphragm paralysis and has seen pulmonology.  However, he felt that Lipitor and Imdur were causing shortness of breath.  He has felt much better with his breathing since stopping these 1 year ago.  He has not had chest pain.  Although, he describes occasional discomfort that is brief.  He decided to only take ASA as needed. Since stopping Lipitor and Imdur, he has not had orthopnea, paroxysmal nocturnal dyspnea.  He has not had edema, syncope.    Prior CV studies:   The following studies were reviewed today:  Echo 12/31/15 Mod LVH, EF 35-40%, apical AK, Gr 1 DD, trivial pericardial effusion.  Renal Artery [name] 9/16: Bilateral RAS 1-59%, SMA >70%  LHC 04/20/15 LAD: Proximal 90%, mid 50%, D1 stent patent LCx: Distal 90%, OM1 80% RCA: Proximal 75% PCI:  1. 2.75 x 32 mm Promus Premier DES to the mid LAD 2. 4 x 28 mm Promus premier DES to the proximal RCA  Echo 04/19/15 Mild LVH, EF 40-45%, mid-apical anterolateral and apical AK, grade 1 diastolic dysfunction, trivial effusion  LHC 04/18/15 LM: Normal LAD: Proximal 90%, mid 50%, severe diffuse LAD stenosis beyond  origin of D1 D1: Occluded with heavy thrombus LCx: Distal 90%, OM1 80% RCA: Proximal 75% EF 35-45% PCI: 3 x 38 mm Xience DES to D1  Past Medical History:  Diagnosis Date  . CAD (coronary artery disease) 04/18/2015   a. Lat STEMI 8/16 - LHC:  pLAD 90, mLAD 50, D1 100, dLCx 90, OM1 80, pRCA 75, EF 35-45% >> DES to D1;  b. staged PCI 04/20/15 - DES to mid LAD and DES to prox RCA  . Depression   . DM2 (diabetes mellitus, type 2) ([place])   . HTN (hypertension) 02/08/2013   Renal Artery [name] 9/16: Bilateral RAS 1-59%, SMA >70%  . Hypothyroidism   . Ischemic cardiomyopathy    a. Echo 8/16:  Mild LVH, EF 40-45%, mid-apical anterolateral and apical AK, grade 1 diastolic dysfunction, trivial effusion  . OSA (obstructive sleep apnea) 09/21/2018   Surgical Hx: The patient  has a past surgical history that includes no prior surgery; Cardiac catheterization (N/A, 04/18/2015); Cardiac catheterization (N/A, 04/18/2015); Cardiac catheterization (N/A, 04/20/2015); and Coronary stent placement.   Current Medications: Current Meds  Medication Sig  . Cholecalciferol (VITAMIN D3) 125 MCG (5000 UT) CAPS Take 5,000 Units by mouth daily.  . clopidogrel (PLAVIX) 75 MG tablet TAKE 1 TABLET BY MOUTH DAILY. PLEASE MAKE ANNUAL APPOINTMENT WITH DISCARD REMAINDER.[place]  . cyclobenzaprine (FLEXERIL) 10  MG tablet Take 1 tablet (10 mg total) by mouth 2 (two) times daily as needed for muscle spasms.  Marland Kitchen levothyroxine (SYNTHROID) 125 MCG tablet Take 125 mcg by mouth daily before breakfast.  . [DISCONTINUED] aspirin EC 81 MG tablet Take 81 mg by mouth once a week.   . [DISCONTINUED] carvedilol (COREG) 12.5 MG tablet Take 1 tablet (12.5 mg total) by mouth 2 (two) times daily with a meal. Please make overdue appt for future refills. Thank you     Allergies:   Patient has no known allergies.   Social History   Tobacco Use  . Smoking status: Light Tobacco Smoker    Packs/day: 0.25    Types: Cigarettes  . Smokeless tobacco:  Never Used  . Tobacco comment: 1-3 cigarettes per day 12.4.19  Substance Use Topics  . Alcohol use: Yes  . Drug use: No     Family Hx: The patient's family history includes Cancer in his father; Heart disease in his mother; Thyroid disease in his sister. There is no history of Colon cancer or Diabetes.  ROS:   Please see the history of present illness.    ROS All other systems reviewed and are negative.   EKGs/Labs/Other Test Reviewed:    EKG:  EKG is  ordered today.  The ekg ordered today demonstrates normal sinus rhythm, HR 76, LAFB, Q waves V1-4, QTc 425, no changes.   Recent Labs: No results found for requested labs within last 8760 hours.   Recent Lipid Panel Lab Results  Component Value Date/Time   CHOL 126 09/15/2017 03:52 PM   TRIG 139 09/15/2017 03:52 PM   HDL 37 (L) 09/15/2017 03:52 PM   CHOLHDL 3.4 09/15/2017 03:52 PM   CHOLHDL 3.6 06/16/2016 08:33 AM   LDLCALC 61 09/15/2017 03:52 PM     Physical Exam:    VS:  BP (!) 150/92   Pulse 76   Ht 5\' 8"  (1.727 m)   Wt 201 lb 12.8 oz (91.5 kg)   SpO2 95%   BMI 30.68 kg/m     Wt Readings from Last 3 Encounters:  03/29/19 201 lb 12.8 oz (91.5 kg)  09/24/18 203 lb (92.1 kg)  09/10/18 203 lb (92.1 kg)     Physical Exam  Constitutional: He is oriented to person, place, and time. He appears well-developed and well-nourished. No distress.  HENT:  Head: Normocephalic and atraumatic.  Eyes: No scleral icterus.  Neck: No JVD present. No thyromegaly present.  Cardiovascular: Normal rate, regular rhythm and normal heart sounds.  No murmur heard. Pulmonary/Chest: Effort normal and breath sounds normal. He has no rales.  Abdominal: Soft. There is no hepatomegaly.  Musculoskeletal:        General: No edema.  Lymphadenopathy:    He has no cervical adenopathy.  Neurological: He is alert and oriented to person, place, and time.  Skin: Skin is warm and dry.  Psychiatric: He has a normal mood and affect.     ASSESSMENT & PLAN:    1. Coronary artery disease involving native coronary artery of native heart without angina pectoris S/p lateral STEMI in 8/16 tx with DES to the D1 and subsequent multivessel PCI with DES to the LAD and RCA after patient refused CABG. He is not having anginal symptoms.  He has stopped taking aspirin on a regular basis.  He has also stopped taking Lipitor.  He is only taking Coreg once a day.  I have advised him to resume aspirin 81 mg daily.  I have advised him to increase Coreg back to 12.5 mg twice daily.  Continue Plavix.  Resume statin therapy as noted below.  2. Chronic systolic heart failure ([place]) EF 35-40 by echo 5/17.  He has been somewhat resistant to medications in the past.  Continue beta-blocker therapy for now.  If agreeable in the future, try to initiate ARB/ACE inhibitor therapy.  He is currently NYHA 2.  Volume status is stable.  3. Essential hypertension Blood pressure above target.  He has not taken any medications yet today.  As noted, I have asked him to increase his carvedilol to 12.5 mg twice daily.  4. Hyperlipidemia, unspecified hyperlipidemia type He stopped taking Lipitor because he thought it was making him short of breath.  I have recommended placing him on Crestor 10 mg daily.  Arrange follow-up CMET, fasting lipids in 3 months.  5. Type II diabetes mellitus with complication ([place]) Continue follow-up with primary care.  He has decided to look for a new primary care provider.  We will try to facilitate a referral to the [facility] primary care.  6.  Tobacco abuse I have advised him to quit.  Dispo:  Return in about 6 months (around 09/29/2019) for Routine Follow Up, w/ Dr. Burt Knack, or Richardson Dopp, PA-C.   Medication Adjustments/Labs and Tests Ordered: Current medicines are reviewed at length with the patient today.  Concerns regarding medicines are outlined above.  Tests Ordered: Orders Placed This Encounter  Procedures  . Basic metabolic panel   . Hepatic function panel  . Lipid panel  . Ambulatory referral to [facility]  . EKG 12-Lead   Medication Changes: Meds ordered this encounter  Medications  . rosuvastatin (CRESTOR) 10 MG tablet    Sig: Take 1 tablet (10 mg total) by mouth daily.    Dispense:  90 tablet    Refill:  3  . carvedilol (COREG) 12.5 MG tablet    Sig: Take 1 tablet (12.5 mg total) by mouth 2 (two) times daily.    Dispense:  180 tablet    Refill:  3  . aspirin EC 81 MG tablet    Sig: Take 1 tablet (81 mg total) by mouth daily.    Dispense:  90 tablet    Refill:  3  . nitroGLYCERIN (NITROSTAT) 0.4 MG SL tablet    Sig: Place 1 tablet (0.4 mg total) under the tongue every 5 (five) minutes as needed for chest pain.    Dispense:  25 tablet    Refill:  5    Signed, Richardson Dopp, PA-C  03/29/2019 5:31 PM    [facility] [address], [place], [place]  [address] Phone: [phone]; Fax: [phone]

## 2019-03-28 NOTE — Telephone Encounter (Signed)

## 2019-03-29 ENCOUNTER — Encounter: Payer: Self-pay | Admitting: Physician Assistant

## 2019-03-29 ENCOUNTER — Other Ambulatory Visit: Payer: Self-pay

## 2019-03-29 ENCOUNTER — Encounter (INDEPENDENT_AMBULATORY_CARE_PROVIDER_SITE_OTHER): Payer: Self-pay

## 2019-03-29 ENCOUNTER — Ambulatory Visit (INDEPENDENT_AMBULATORY_CARE_PROVIDER_SITE_OTHER): Payer: PPO | Admitting: Physician Assistant

## 2019-03-29 VITALS — BP 150/92 | HR 76 | Ht 68.0 in | Wt 201.8 lb

## 2019-03-29 DIAGNOSIS — E118 Type 2 diabetes mellitus with unspecified complications: Secondary | ICD-10-CM

## 2019-03-29 DIAGNOSIS — E785 Hyperlipidemia, unspecified: Secondary | ICD-10-CM | POA: Diagnosis not present

## 2019-03-29 DIAGNOSIS — I2510 Atherosclerotic heart disease of native coronary artery without angina pectoris: Secondary | ICD-10-CM | POA: Diagnosis not present

## 2019-03-29 DIAGNOSIS — I5022 Chronic systolic (congestive) heart failure: Secondary | ICD-10-CM | POA: Diagnosis not present

## 2019-03-29 DIAGNOSIS — I10 Essential (primary) hypertension: Secondary | ICD-10-CM | POA: Diagnosis not present

## 2019-03-29 DIAGNOSIS — Z720 Tobacco use: Secondary | ICD-10-CM

## 2019-03-29 MED ORDER — ROSUVASTATIN CALCIUM 10 MG PO TABS: 10.0000 mg | ORAL_TABLET | Freq: Every day | ORAL | 3 refills | Status: DC

## 2019-03-29 MED ORDER — CARVEDILOL 12.5 MG PO TABS: 12.5000 mg | ORAL_TABLET | Freq: Two times a day (BID) | ORAL | 3 refills | Status: DC

## 2019-03-29 MED ORDER — NITROGLYCERIN 0.4 MG SL SUBL: 0.4000 mg | SUBLINGUAL_TABLET | SUBLINGUAL | 5 refills | Status: DC | PRN

## 2019-03-29 MED ORDER — ASPIRIN EC 81 MG PO TBEC: 81.0000 mg | DELAYED_RELEASE_TABLET | Freq: Every day | ORAL | 3 refills | Status: DC

## 2019-03-29 NOTE — Patient Instructions (Signed)
Medication Instructions:  Your physician has recommended you make the following change in your medication:  1. STOP LIPITOR 2. STOP IMDUR 3. START CRESTOR 10 MG DAILY. 4.INCREASE COREG TO 12.5 MG TWICE DAILY. 5. INCREASE ASPIRIN TO 81 MG DAILY.  If you need a refill on your cardiac medications before your next appointment, please call your pharmacy.   Lab work: TO BE DONE IN 3 MONTHS: BMET, LFTS, LIPIDS  If you have labs (blood work) drawn today and your tests are completely normal, you will receive your results only by: Marland Kitchen MyChart Message (if you have MyChart) OR . A paper copy in the mail If you have any lab test that is abnormal or we need to change your treatment, we will call you to review the results.  Testing/Procedures: NONE  Follow-Up: At [facility], you and your health needs are our priority.  As part of our continuing mission to provide you with exceptional heart care, we have created designated Provider Care Teams.  These Care Teams include your primary Cardiologist (physician) and Advanced Practice Providers (APPs -  Physician Assistants and Nurse Practitioners) who all work together to provide you with the care you need, when you need it. You will need a follow up appointment in:  6 months.  Please call our office 2 months in advance to schedule this appointment.  You may see Sherren Mocha, MD or Richardson Dopp, PA-C YOU HAVE BEEN REFERRED TO SEE A PRIMARY CARE DOCTOR. THEY WILL CALL YOU TO SCHEDULE AN APPOINTMENT.   Any Other Special Instructions Will Be Listed Below (If Applicable).

## 2019-05-27 DIAGNOSIS — I10 Essential (primary) hypertension: Secondary | ICD-10-CM | POA: Diagnosis not present

## 2019-05-27 DIAGNOSIS — E7800 Pure hypercholesterolemia, unspecified: Secondary | ICD-10-CM | POA: Diagnosis not present

## 2019-05-27 DIAGNOSIS — E039 Hypothyroidism, unspecified: Secondary | ICD-10-CM | POA: Diagnosis not present

## 2019-05-27 DIAGNOSIS — Z79899 Other long term (current) drug therapy: Secondary | ICD-10-CM | POA: Diagnosis not present

## 2019-05-27 DIAGNOSIS — E1165 Type 2 diabetes mellitus with hyperglycemia: Secondary | ICD-10-CM | POA: Diagnosis not present

## 2019-06-27 ENCOUNTER — Telehealth: Payer: Self-pay | Admitting: Cardiovascular Disease

## 2019-06-27 DIAGNOSIS — E559 Vitamin D deficiency, unspecified: Secondary | ICD-10-CM

## 2019-06-27 NOTE — Telephone Encounter (Signed)
I spoke to the patient and informed him that he needs to keep his 10/28 lab appointment.  His recent labs from his PCP were a couple months ago.  He asked if we could check vitamin D while he is here..  Please advise, thank you

## 2019-06-27 NOTE — Telephone Encounter (Signed)
Vit D ordered and linked.

## 2019-06-27 NOTE — Telephone Encounter (Signed)
New Message:    Pt wants to know if he can have his lab work from his Primary Doctor faxed over here, so he will not have to have lab work here tomorrow?

## 2019-06-27 NOTE — Telephone Encounter (Signed)
Sure. Nicolas Ray can use Vit D deficiency as the Dx.  The results will need to be sent to the PCP to address. Richardson Dopp, PA-C    06/27/2019 1:59 PM

## 2019-06-29 ENCOUNTER — Other Ambulatory Visit: Payer: PPO | Admitting: *Deleted

## 2019-06-29 ENCOUNTER — Other Ambulatory Visit: Payer: Self-pay

## 2019-06-29 DIAGNOSIS — E559 Vitamin D deficiency, unspecified: Secondary | ICD-10-CM

## 2019-06-29 DIAGNOSIS — I2510 Atherosclerotic heart disease of native coronary artery without angina pectoris: Secondary | ICD-10-CM

## 2019-06-29 DIAGNOSIS — I10 Essential (primary) hypertension: Secondary | ICD-10-CM | POA: Diagnosis not present

## 2019-06-29 DIAGNOSIS — E118 Type 2 diabetes mellitus with unspecified complications: Secondary | ICD-10-CM

## 2019-06-29 DIAGNOSIS — I5022 Chronic systolic (congestive) heart failure: Secondary | ICD-10-CM | POA: Diagnosis not present

## 2019-06-29 DIAGNOSIS — E785 Hyperlipidemia, unspecified: Secondary | ICD-10-CM | POA: Diagnosis not present

## 2019-06-30 LAB — BASIC METABOLIC PANEL
BUN/Creatinine Ratio: 16 (ref 10–24)
BUN: 25 mg/dL (ref 8–27)
CO2: 24 mmol/L (ref 20–29)
Calcium: 9.1 mg/dL (ref 8.6–10.2)
Chloride: 98 mmol/L (ref 96–106)
Creatinine, Ser: 1.55 mg/dL — ABNORMAL HIGH (ref 0.76–1.27)
GFR calc Af Amer: 52 mL/min/{1.73_m2} — ABNORMAL LOW (ref >59)
GFR calc non Af Amer: 45 mL/min/{1.73_m2} — ABNORMAL LOW (ref >59)
Glucose: 138 mg/dL — ABNORMAL HIGH (ref 65–99)
Potassium: 4.2 mmol/L (ref 3.5–5.2)
Sodium: 137 mmol/L (ref 134–144)

## 2019-06-30 LAB — LIPID PANEL
Chol/HDL Ratio: 2.9 ratio (ref 0.0–5.0)
Cholesterol, Total: 106 mg/dL (ref 100–199)
HDL: 36 mg/dL — ABNORMAL LOW (ref >39)
LDL Chol Calc (NIH): 50 mg/dL (ref 0–99)
Triglycerides: 107 mg/dL (ref 0–149)
VLDL Cholesterol Cal: 20 mg/dL (ref 5–40)

## 2019-06-30 LAB — HEPATIC FUNCTION PANEL
ALT: 22 IU/L (ref 0–44)
AST: 20 IU/L (ref 0–40)
Albumin: 4.4 g/dL (ref 3.8–4.8)
Alkaline Phosphatase: 73 IU/L (ref 39–117)
Bilirubin Total: 1.1 mg/dL (ref 0.0–1.2)
Bilirubin, Direct: 0.32 mg/dL (ref 0.00–0.40)
Total Protein: 7.8 g/dL (ref 6.0–8.5)

## 2019-06-30 LAB — VITAMIN D 25 HYDROXY (VIT D DEFICIENCY, FRACTURES): Vit D, 25-Hydroxy: 41.1 ng/mL (ref 30.0–100.0)

## 2019-08-11 ENCOUNTER — Other Ambulatory Visit: Payer: Self-pay | Admitting: Cardiovascular Disease

## 2019-08-11 MED ORDER — CLOPIDOGREL BISULFATE 75 MG PO TABS: ORAL_TABLET | ORAL | 2 refills | Status: DC

## 2019-09-22 DIAGNOSIS — Z2821 Immunization not carried out because of patient refusal: Secondary | ICD-10-CM | POA: Diagnosis not present

## 2019-09-22 DIAGNOSIS — L209 Atopic dermatitis, unspecified: Secondary | ICD-10-CM | POA: Diagnosis not present

## 2019-09-22 DIAGNOSIS — Z0000 Encounter for general adult medical examination without abnormal findings: Secondary | ICD-10-CM | POA: Diagnosis not present

## 2019-10-28 DIAGNOSIS — E039 Hypothyroidism, unspecified: Secondary | ICD-10-CM | POA: Diagnosis not present

## 2019-10-28 DIAGNOSIS — I10 Essential (primary) hypertension: Secondary | ICD-10-CM | POA: Diagnosis not present

## 2019-10-28 DIAGNOSIS — Z03818 Encounter for observation for suspected exposure to other biological agents ruled out: Secondary | ICD-10-CM | POA: Diagnosis not present

## 2019-10-28 DIAGNOSIS — E7800 Pure hypercholesterolemia, unspecified: Secondary | ICD-10-CM | POA: Diagnosis not present

## 2019-10-28 DIAGNOSIS — E559 Vitamin D deficiency, unspecified: Secondary | ICD-10-CM | POA: Diagnosis not present

## 2019-10-28 DIAGNOSIS — E1165 Type 2 diabetes mellitus with hyperglycemia: Secondary | ICD-10-CM | POA: Diagnosis not present

## 2019-10-28 DIAGNOSIS — L209 Atopic dermatitis, unspecified: Secondary | ICD-10-CM | POA: Diagnosis not present

## 2019-10-29 DIAGNOSIS — T380X5A Adverse effect of glucocorticoids and synthetic analogues, initial encounter: Secondary | ICD-10-CM | POA: Diagnosis not present

## 2019-11-02 ENCOUNTER — Ambulatory Visit (HOSPITAL_COMMUNITY)
Admission: EM | Admit: 2019-11-02 | Discharge: 2019-11-02 | Disposition: A | Discharge status: Home / Self Care | Payer: PPO | Attending: Family Medicine | Admitting: Family Medicine

## 2019-11-02 ENCOUNTER — Other Ambulatory Visit: Payer: Self-pay

## 2019-11-02 ENCOUNTER — Encounter (HOSPITAL_COMMUNITY): Payer: Self-pay | Admitting: Family Medicine

## 2019-11-02 DIAGNOSIS — R21 Rash and other nonspecific skin eruption: Secondary | ICD-10-CM | POA: Diagnosis not present

## 2019-11-02 MED ORDER — IVERMECTIN 3 MG PO TABS: 3.0000 mg | ORAL_TABLET | Freq: Once | ORAL | 1 refills | Status: AC

## 2019-11-02 MED ORDER — PERMETHRIN 5 % EX CREA: TOPICAL_CREAM | CUTANEOUS | 1 refills | Status: DC

## 2019-11-02 NOTE — ED Triage Notes (Signed)
Pt presents with complaints of itching to his back x 4 days. Unsure what is causing it. Pt reports taking benadryl at home and having momentarily relief.

## 2019-11-02 NOTE — Discharge Instructions (Addendum)
Treating you for scabies Take the pills in one time dose and repeat in 2 weeks if needed.  Apply the cream head to toe and leave on for 8 to 12 hours and wash off. May repeat in 2 weeks if needed Refills sent to the pharmacy Continue the Benadryl as needed for itching Make sure you wash all your bedding and clothing in hot water. Follow up as needed for continued or worsening symptoms

## 2019-11-03 NOTE — ED Provider Notes (Signed)
[facility]    CSN: [id number] Arrival date & time: 11/02/19  1353      History   Chief Complaint Chief Complaint  Patient presents with  . Pruritis    HPI Nicolas Ray is a 69 y.o. male.   Patient is a 69 year old male presents today with rash.  This rash has been present, worsening over the past few weeks.  Initially was treated for rash with triamcinolone and steroid injection for possible contact dermatitis or allergic reaction.  Reports this treatment did not help at all.  The rash tends to worsen around 8 PM every night.  He then takes Benadryl and this seems to help him rest.  The rash is located to lower back near her pant line and has been in the groin area.  Denies any fever, joint pain. Denies any recent changes in lotions, detergents, foods or other possible irritants. No recent travel. Nobody else at home has the rash. Patient has been outside but denies any contact with plants or insects. No new foods or medications.   ROS per HPI      Past Medical History:  Diagnosis Date  . CAD (coronary artery disease) 04/18/2015   a. Lat STEMI 8/16 - LHC:  pLAD 90, mLAD 50, D1 100, dLCx 90, OM1 80, pRCA 75, EF 35-45% >> DES to D1;  b. staged PCI 04/20/15 - DES to mid LAD and DES to prox RCA  . Depression   . DM2 (diabetes mellitus, type 2) ([place])   . HTN (hypertension) 02/08/2013   Renal Artery [name] 9/16: Bilateral RAS 1-59%, SMA >70%  . Hypothyroidism   . Ischemic cardiomyopathy    a. Echo 8/16:  Mild LVH, EF 40-45%, mid-apical anterolateral and apical AK, grade 1 diastolic dysfunction, trivial effusion  . OSA (obstructive sleep apnea) 09/21/2018    Patient Active Problem List   Diagnosis Date Noted  . OSA (obstructive sleep apnea) 09/21/2018  . Coronary artery disease involving native coronary artery of native heart with unstable angina pectoris ([place])   . Type II diabetes mellitus with complication ([place]) [id number]  . Acute systolic heart failure ([place]) 04/19/2015   . Acute MI, lateral wall, initial episode of care ([place]) 04/18/2015  . ST elevation myocardial infarction (STEMI) of lateral wall ([place]) 04/18/2015  . Other male erectile dysfunction 04/18/2014  . Diabetes ([place]) 10/13/2013  . Sciatica 03/15/2013  . Hypothyroidism 02/08/2013  . Essential hypertension 02/08/2013  . Depression 01/25/2013  . Low back pain 01/25/2013    Past Surgical History:  Procedure Laterality Date  . CARDIAC CATHETERIZATION N/A 04/18/2015   Procedure: Left Heart Cath and Coronary Angiography;  Surgeon: Sherren Mocha, MD;  Location: [facility];  Service: Cardiovascular;  Laterality: N/A;  . CARDIAC CATHETERIZATION N/A 04/18/2015   Procedure: Coronary Stent Intervention;  Surgeon: Sherren Mocha, MD;  Location: [facility];  Service: Cardiovascular;  Laterality: N/A;  . CARDIAC CATHETERIZATION N/A 04/20/2015   Procedure: Coronary Stent Intervention;  Surgeon: Burnell Blanks, MD;  Location: [facility];  Service: Cardiovascular;  Laterality: N/A;  . CORONARY STENT PLACEMENT    . no prior surgery         Home Medications    Prior to Admission medications   Medication Sig Start Date End Date Taking? Authorizing Provider  aspirin EC 81 MG tablet Take 1 tablet (81 mg total) by mouth daily. 03/29/19   Richardson Dopp T, PA-C  carvedilol (COREG) 12.5 MG tablet Take 1  tablet (12.5 mg total) by mouth 2 (two) times daily. 03/29/19 06/27/19  Richardson Dopp T, PA-C  Cholecalciferol (VITAMIN D3) 125 MCG (5000 UT) CAPS Take 5,000 Units by mouth daily.    [provider]  clopidogrel (PLAVIX) 75 MG tablet TAKE 1 TABLET BY MOUTH DAILY. 08/11/19   Sherren Mocha, MD  cyclobenzaprine (FLEXERIL) 10 MG tablet Take 1 tablet (10 mg total) by mouth 2 (two) times daily as needed for muscle spasms. 04/30/18   Khatri, Hina, PA-C  levothyroxine (SYNTHROID) 125 MCG tablet Take 125 mcg by mouth daily before breakfast.    [provider]  nitroGLYCERIN  (NITROSTAT) 0.4 MG SL tablet Place 1 tablet (0.4 mg total) under the tongue every 5 (five) minutes as needed for chest pain. 03/29/19 06/27/19  Richardson Dopp T, PA-C  permethrin (ELIMITE) 5 % cream Apply to affected area once 11/02/19   Loura Halt A, NP  rosuvastatin (CRESTOR) 10 MG tablet Take 1 tablet (10 mg total) by mouth daily. 03/29/19 06/27/19  Liliane Shi, PA-C    Family History Family History  Problem Relation Age of Onset  . Heart disease Mother   . Cancer Father   . Thyroid disease Sister   . Colon cancer Neg Hx   . Diabetes Neg Hx     Social History Social History   Tobacco Use  . Smoking status: Light Tobacco Smoker    Packs/day: 0.25    Types: Cigarettes  . Smokeless tobacco: Never Used  . Tobacco comment: 1-3 cigarettes per day 12.4.19  Substance Use Topics  . Alcohol use: Yes  . Drug use: No     Allergies   Patient has no known allergies.   Review of Systems Review of Systems   Physical Exam Triage Vital Signs ED Triage Vitals [11/02/19 1416]  Enc Vitals Group     BP (!) 155/81     Pulse Rate 76     Resp 18     Temp 98.5 F (36.9 C)     Temp src      SpO2 96 %     Weight      Height      Head Circumference      Peak Flow      Pain Score 0     Pain Loc      Pain Edu?      Excl. in [place]?    No data found.  Updated Vital Signs BP (!) 155/81   Pulse 76   Temp 98.5 F (36.9 C)   Resp 18   SpO2 96%   Visual Acuity Right Eye Distance:   Left Eye Distance:   Bilateral Distance:    Right Eye Near:   Left Eye Near:    Bilateral Near:     Physical Exam Vitals and nursing note reviewed.  Constitutional:      Appearance: Normal appearance.  HENT:     Head: Normocephalic and atraumatic.     Nose: Nose normal.  Eyes:     Conjunctiva/sclera: Conjunctivae normal.  Pulmonary:     Effort: Pulmonary effort is normal.  Musculoskeletal:        General: Normal range of motion.     Cervical back: Normal range of motion.  Skin:     General: Skin is warm and dry.     Findings: Rash present.     Comments: Widespread rash to lower back area across pant line Same rash located to bilateral hands Excoriations throughout  Neurological:  Mental Status: He is alert.  Psychiatric:        Mood and Affect: Mood normal.      UC Treatments / Results  Labs (all labs ordered are listed, but only abnormal results are displayed) Labs Reviewed - No data to display  EKG   Radiology No results found.  Procedures Procedures (including critical care time)  Medications Ordered in UC Medications - No data to display  Initial Impression / Assessment and Plan / UC Course  I have reviewed the triage vital signs and the nursing notes.  Pertinent labs & imaging results that were available during my care of the patient were reviewed by me and considered in my medical decision making (see chart for details).     Rash-consistent with scabies.  Will treat with permethrin cream and ivermectin based on length of symptoms Instructions given on how to take the medication. Follow up as needed for continued or worsening symptoms  Final Clinical Impressions(s) / UC Diagnoses   Final diagnoses:  Rash     Discharge Instructions     Treating you for scabies Take the pills in one time dose and repeat in 2 weeks if needed.  Apply the cream head to toe and leave on for 8 to 12 hours and wash off. May repeat in 2 weeks if needed Refills sent to the pharmacy Continue the Benadryl as needed for itching Make sure you wash all your bedding and clothing in hot water. Follow up as needed for continued or worsening symptoms     ED Prescriptions    Medication Sig Dispense Auth. Provider   ivermectin (STROMECTOL) 3 MG TABS tablet Take 1 tablet (3 mg total) by mouth once for 1 dose. 4 tablet Bobie Caris A, NP   permethrin (ELIMITE) 5 % cream Apply to affected area once 60 g Aztlan Coll A, NP     PDMP not reviewed this encounter.    Orvan July, NP 11/03/19 1138

## 2019-11-16 DIAGNOSIS — Z20822 Contact with and (suspected) exposure to covid-19: Secondary | ICD-10-CM | POA: Diagnosis not present

## 2019-11-16 DIAGNOSIS — U071 COVID-19: Secondary | ICD-10-CM | POA: Diagnosis not present

## 2019-11-16 DIAGNOSIS — Z20828 Contact with and (suspected) exposure to other viral communicable diseases: Secondary | ICD-10-CM | POA: Diagnosis not present

## 2019-11-16 DIAGNOSIS — Z03818 Encounter for observation for suspected exposure to other biological agents ruled out: Secondary | ICD-10-CM | POA: Diagnosis not present

## 2020-02-22 DIAGNOSIS — I10 Essential (primary) hypertension: Secondary | ICD-10-CM | POA: Diagnosis not present

## 2020-02-22 DIAGNOSIS — E1165 Type 2 diabetes mellitus with hyperglycemia: Secondary | ICD-10-CM | POA: Diagnosis not present

## 2020-02-22 DIAGNOSIS — E039 Hypothyroidism, unspecified: Secondary | ICD-10-CM | POA: Diagnosis not present

## 2020-03-15 ENCOUNTER — Other Ambulatory Visit: Payer: Self-pay | Admitting: Physician Assistant

## 2020-03-15 ENCOUNTER — Other Ambulatory Visit: Payer: Self-pay | Admitting: Cardiovascular Disease

## 2020-03-19 ENCOUNTER — Other Ambulatory Visit: Payer: Self-pay | Admitting: Physician Assistant

## 2020-04-09 DIAGNOSIS — M25531 Pain in right wrist: Secondary | ICD-10-CM | POA: Diagnosis not present

## 2020-04-09 DIAGNOSIS — E1165 Type 2 diabetes mellitus with hyperglycemia: Secondary | ICD-10-CM | POA: Diagnosis not present

## 2020-04-09 DIAGNOSIS — I10 Essential (primary) hypertension: Secondary | ICD-10-CM | POA: Diagnosis not present

## 2020-04-18 ENCOUNTER — Other Ambulatory Visit: Payer: Self-pay | Admitting: Physician Assistant

## 2020-04-18 ENCOUNTER — Other Ambulatory Visit: Payer: Self-pay | Admitting: Cardiovascular Disease

## 2020-04-19 ENCOUNTER — Other Ambulatory Visit: Payer: Self-pay | Admitting: Cardiovascular Disease

## 2020-04-19 MED ORDER — ROSUVASTATIN CALCIUM 10 MG PO TABS
10.0000 mg | ORAL_TABLET | Freq: Every day | ORAL | 0 refills | Status: DC
Start: 2020-04-19 — End: 2020-05-18

## 2020-04-19 NOTE — Telephone Encounter (Signed)
°*  STAT* If patient is at the pharmacy, call can be transferred to refill team.   1. Which medications need to be refilled? (please list name of each medication and dose if known)  rosuvastatin (CRESTOR) 10 MG tablet  2. Which pharmacy/location (including street and city if local pharmacy) is medication to be sent to? [place], [place] - [address] AT [place]  3. Do they need a 30 day or 90 day supply? 90 day supply    Only has 4-5 tablets left.

## 2020-04-19 NOTE — Telephone Encounter (Signed)
Pt's medication was sent to pt's pharmacy as requested. Confirmation received.  °

## 2020-05-04 ENCOUNTER — Other Ambulatory Visit: Payer: Self-pay | Admitting: Cardiovascular Disease

## 2020-05-17 NOTE — Progress Notes (Addendum)
Cardiology Office Note:    Date:  05/18/2020   ID:  Nicolas Ray, DOB 03-31-1951, MRN [id number]  PCP:  Sandi Mariscal, MD  Cardiologist:  Sherren Mocha, MD   Electrophysiologist:  None  Pulmonologist: [name], MD  Referring MD: Sandi Mariscal, MD   Chief Complaint:  Follow-up (CAD, CHF)    Patient Profile: Nicolas Ray is a 69 y.o. male with:  Coronary artery disease   S/p Lat STEMI 8/16 >> s/p DES to D1  Patient refused CABG >> staged PCI: DES to mid LAD and DES to prox RCA  Chronic systolic CHF  Ischemic CM  ACEi DCd in past due to worsening Creatinine   Echocardiogram 8/16: EF 40-45  Echocardiogram 5/17: EF 35-40  Diabetes mellitus   Chronic kidney disease 3  Hypertension   Medical non-adherence   Tobacco use  R hemidiaphragm paralysis  Mild OSA  Prior CV Studies: Echo 12/31/15 Mod LVH, EF 35-40%, apical AK, Gr 1 DD, trivial pericardial effusion.  Renal Artery [name] 9/16: Bilateral RAS 1-59%, SMA >70%  LHC 04/20/15 LAD: Proximal 90%, mid 50%, D1 stent patent LCx: Distal 90%, OM1 80% RCA: Proximal 75% PCI:  1. 2.75 x 32 mm Promus Premier DES to the mid LAD 2. 4 x 28 mm Promus premier DES to the proximal RCA  Echo 04/19/15 Mild LVH, EF 40-45%, mid-apical anterolateral and apical AK, grade 1 diastolic dysfunction, trivial effusion  LHC 04/18/15 LM: Normal LAD: Proximal 90%, mid 50%, severe diffuse LAD stenosis beyond origin of D1 D1: Occluded with heavy thrombus LCx: Distal 90%, OM1 80% RCA: Proximal 75% EF 35-45% PCI: 3 x 38 mm Xience DES to D1   History of Present Illness:    Mr. Nicolas Ray was last seen in 03/2019.  He returns for follow up.  He is here alone.  He is now taking ASA 1-2 times a month.  He also is still taking Carvedilol once a day.  He continues to smoke.  He has not had chest discomfort, significant shortness of breath, orthopnea, syncope.  He has some ankle edema but this is fairly stable without change.    Past Medical History:    Diagnosis Date  . CAD (coronary artery disease) 04/18/2015   a. Lat STEMI 8/16 - LHC:  pLAD 90, mLAD 50, D1 100, dLCx 90, OM1 80, pRCA 75, EF 35-45% >> DES to D1;  b. staged PCI 04/20/15 - DES to mid LAD and DES to prox RCA  . Depression   . DM2 (diabetes mellitus, type 2) ([place])   . HTN (hypertension) 02/08/2013   Renal Artery [name] 9/16: Bilateral RAS 1-59%, SMA >70%  . Hypothyroidism   . Ischemic cardiomyopathy    a. Echo 8/16:  Mild LVH, EF 40-45%, mid-apical anterolateral and apical AK, grade 1 diastolic dysfunction, trivial effusion  . OSA (obstructive sleep apnea) 09/21/2018    Current Medications: Current Meds  Medication Sig  . aspirin EC 81 MG tablet Take 1 tablet by mouth daily.  . carvedilol (COREG) 12.5 MG tablet Take 1 tablet (12.5 mg total) by mouth 2 (two) times daily.  . Cholecalciferol (VITAMIN D3) 125 MCG (5000 UT) CAPS Take 5,000 Units by mouth daily.  . clopidogrel (PLAVIX) 75 MG tablet Take 1 tablet (75 mg total) by mouth daily.  Marland Kitchen co-enzyme Q-10 30 MG capsule Take 30 mg by mouth daily.   Marland Kitchen gabapentin (NEURONTIN) 300 MG capsule Take 600 mg by mouth at bedtime.  Marland Kitchen levothyroxine (SYNTHROID) 125 MCG tablet  Take 125 mcg by mouth daily before breakfast.  . nitroGLYCERIN (NITROSTAT) 0.4 MG SL tablet Place 1 tablet (0.4 mg total) under the tongue every 5 (five) minutes as needed for chest pain.  . rosuvastatin (CRESTOR) 10 MG tablet Take 1 tablet (10 mg total) by mouth daily.  . [DISCONTINUED] carvedilol (COREG) 12.5 MG tablet Take 1 tablet (12.5 mg total) by mouth 2 (two) times daily.  . [DISCONTINUED] clopidogrel (PLAVIX) 75 MG tablet TAKE 1 TABLET BY MOUTH DAILY. Please keep upcoming appt in September for future refills. Thank you  . [DISCONTINUED] nitroGLYCERIN (NITROSTAT) 0.4 MG SL tablet Place 1 tablet (0.4 mg total) under the tongue every 5 (five) minutes as needed for chest pain.  . [DISCONTINUED] rosuvastatin (CRESTOR) 10 MG tablet Take 1 tablet (10 mg total) by mouth  daily. Please keep upcoming appt in September before anymore refills. Thank you     Allergies:   Patient has no known allergies.   Social History   Tobacco Use  . Smoking status: Light Tobacco Smoker    Packs/day: 0.25    Types: Cigarettes  . Smokeless tobacco: Never Used  . Tobacco comment: 1-3 cigarettes per day 12.4.19  Vaping Use  . Vaping Use: Never used  Substance Use Topics  . Alcohol use: Yes  . Drug use: No     Family Hx: The patient's family history includes Cancer in his father; Heart disease in his mother; Thyroid disease in his sister. There is no history of Colon cancer or Diabetes.  ROS:   Please see the history of present illness.    ROS    EKGs/Labs/Other Test Reviewed:    EKG:  EKG is   ordered today.  The ekg ordered today demonstrates normal sinus rhythm, HR 71, LAD, ant-lat Q waves, Lat TW inversions, QTc 445, no changes.   Recent Labs: 06/29/2019: ALT 22; BUN 25; Creatinine, Ser 1.55; Potassium 4.2; Sodium 137   Recent Lipid Panel Lab Results  Component Value Date/Time   CHOL 106 06/29/2019 09:53 AM   TRIG 107 06/29/2019 09:53 AM   HDL 36 (L) 06/29/2019 09:53 AM   CHOLHDL 2.9 06/29/2019 09:53 AM   CHOLHDL 3.6 06/16/2016 08:33 AM   LDLCALC 50 06/29/2019 09:53 AM     Physical Exam:    VS:  BP (!) 156/70   Pulse 71   Ht 5\' 8"  (1.727 m)   Wt 198 lb (89.8 kg)   SpO2 95%   BMI 30.11 kg/m     Wt Readings from Last 3 Encounters:  05/18/20 198 lb (89.8 kg)  03/29/19 201 lb 12.8 oz (91.5 kg)  09/24/18 203 lb (92.1 kg)     Constitutional:      Appearance: Healthy appearance. Not in distress.  Neck:     Vascular: JVD normal.  Pulmonary:     Effort: Pulmonary effort is normal.     Breath sounds: No wheezing. No rales.  Cardiovascular:     Normal rate. Regular rhythm. Normal S1. Normal S2.     Murmurs: There is no murmur.  Edema:    Ankle: bilateral 1+ edema of the ankle. Abdominal:     Palpations: Abdomen is soft.  Skin:     General: Skin is warm and dry.  Neurological:     General: No focal deficit present.     Mental Status: Alert and oriented to person, place and time.     Cranial Nerves: Cranial nerves are intact.       ASSESSMENT &  PLAN:    1. Coronary artery disease involving native coronary artery of native heart without angina pectoris S/p lateral STEMI in 8/16 tx with DES to the D1 and subsequent multivessel PCI with DES to the LAD and RCA after patient refused CABG.  He is not having angina.  We discussed the importance of taking ASA 81 mg every day. Continue ASA , Clopidogrel, Carvedilol, Rosuvastatin.  FU in 1 year.   2. Chronic systolic heart failure (HCC) Ischemic CM.  EF 35-40.  NYHA II.  Volume appears stable. He could not tolerate ACEi in the past due to worsening creatinine.  He has been on Hydralazine and Isosorbide in the past but stopped these on his own.  He only takes Carvedilol once daily and I have again advised him he should take this twice daily.  He notes that he does not understand why he should take it twice a day if he feels he does not need it.  I explained to him that the dosing of carvedilol is twice daily and it is less effective if only taken once a day.  I also explained that it is advisable to take a beta-blocker and afterload reducing drug as part of GDMT.  If his BP can tolerate it, I would try to put him back on Hydralazine.    3. Essential hypertension I have asked him to take Carvedilol twice daily and check his BP several times a week for a few weeks.  I have asked him to send me those readings.  If his BP can tolerate the addition of Hydralazine, I will place him on 25 mg twice daily.    4. Hyperlipidemia, unspecified hyperlipidemia type LDL optimal on most recent lab work.  Continue current Rx.    5. Type II diabetes mellitus with complication ([place]) Managed by his PCP.  He tells me that he changed his diet significantly and has not required medication.    6. Tobacco  abuse I have again recommended cessation to him.      Dispo:  Return in about 1 year (around 05/18/2021) for Routine Follow Up, w/ Dr. Burt Knack, or Richardson Dopp, PA-C, in person.   Medication Adjustments/Labs and Tests Ordered: Current medicines are reviewed at length with the patient today.  Concerns regarding medicines are outlined above.  Tests Ordered: Orders Placed This Encounter  Procedures  . EKG 12-Lead   Medication Changes: Meds ordered this encounter  Medications  . carvedilol (COREG) 12.5 MG tablet    Sig: Take 1 tablet (12.5 mg total) by mouth 2 (two) times daily.    Dispense:  180 tablet    Refill:  3  . clopidogrel (PLAVIX) 75 MG tablet    Sig: Take 1 tablet (75 mg total) by mouth daily.    Dispense:  90 tablet    Refill:  3  . nitroGLYCERIN (NITROSTAT) 0.4 MG SL tablet    Sig: Place 1 tablet (0.4 mg total) under the tongue every 5 (five) minutes as needed for chest pain.    Dispense:  25 tablet    Refill:  5  . rosuvastatin (CRESTOR) 10 MG tablet    Sig: Take 1 tablet (10 mg total) by mouth daily.    Dispense:  90 tablet    Refill:  3    Signed, Richardson Dopp, PA-C  05/18/2020 11:43 AM    [facility] [address], [place], [place]  [address] Phone: [phone]; Fax: [phone]

## 2020-05-18 ENCOUNTER — Encounter: Payer: Self-pay | Admitting: Physician Assistant

## 2020-05-18 ENCOUNTER — Ambulatory Visit (INDEPENDENT_AMBULATORY_CARE_PROVIDER_SITE_OTHER): Payer: PPO | Admitting: Physician Assistant

## 2020-05-18 ENCOUNTER — Other Ambulatory Visit: Payer: Self-pay

## 2020-05-18 VITALS — BP 156/70 | HR 71 | Ht 68.0 in | Wt 198.0 lb

## 2020-05-18 DIAGNOSIS — Z720 Tobacco use: Secondary | ICD-10-CM

## 2020-05-18 DIAGNOSIS — I10 Essential (primary) hypertension: Secondary | ICD-10-CM

## 2020-05-18 DIAGNOSIS — I5022 Chronic systolic (congestive) heart failure: Secondary | ICD-10-CM

## 2020-05-18 DIAGNOSIS — E118 Type 2 diabetes mellitus with unspecified complications: Secondary | ICD-10-CM | POA: Diagnosis not present

## 2020-05-18 DIAGNOSIS — I2510 Atherosclerotic heart disease of native coronary artery without angina pectoris: Secondary | ICD-10-CM | POA: Diagnosis not present

## 2020-05-18 DIAGNOSIS — E785 Hyperlipidemia, unspecified: Secondary | ICD-10-CM

## 2020-05-18 MED ORDER — CLOPIDOGREL BISULFATE 75 MG PO TABS
75.0000 mg | ORAL_TABLET | Freq: Every day | ORAL | 3 refills | Status: DC
Start: 2020-05-18 — End: 2021-07-23

## 2020-05-18 MED ORDER — NITROGLYCERIN 0.4 MG SL SUBL: 0.4000 mg | SUBLINGUAL_TABLET | SUBLINGUAL | 5 refills | Status: DC | PRN

## 2020-05-18 MED ORDER — CARVEDILOL 12.5 MG PO TABS
12.5000 mg | ORAL_TABLET | Freq: Two times a day (BID) | ORAL | 3 refills | Status: DC
Start: 2020-05-18 — End: 2021-07-23

## 2020-05-18 MED ORDER — ROSUVASTATIN CALCIUM 10 MG PO TABS
10.0000 mg | ORAL_TABLET | Freq: Every day | ORAL | 3 refills | Status: DC
Start: 2020-05-18 — End: 2020-11-23

## 2020-05-18 NOTE — Patient Instructions (Signed)
Medication Instructions:  Your physician has recommended you make the following change in your medication:   1) Take Aspirin 81 mg, 1 tablet by mouth once a day 2) Make sure you are taking Coreg 12.5 mg, 1 tablet by mouth twice a day  *If you need a refill on your cardiac medications before your next appointment, please call your pharmacy*  Lab Work: None ordered today  Testing/Procedures: None ordered today  Follow-Up: At [facility], you and your health needs are our priority.  As part of our continuing mission to provide you with exceptional heart care, we have created designated Provider Care Teams.  These Care Teams include your primary Cardiologist (physician) and Advanced Practice Providers (APPs -  Physician Assistants and Nurse Practitioners) who all work together to provide you with the care you need, when you need it.  We recommend signing up for the patient portal called "MyChart".  Sign up information is provided on this After Visit Summary.  MyChart is used to connect with patients for Virtual Visits (Telemedicine).  Patients are able to view lab/test results, encounter notes, upcoming appointments, etc.  Non-urgent messages can be sent to your provider as well.   To learn more about what you can do with MyChart, go to [email or web address].    Your next appointment:   12 month(s)  The format for your next appointment:   In Person  Provider:   Sherren Mocha, MD   Other Instructions Check your blood pressure once a day for a few weeks and call with readings.   Coping with Quitting Smoking  Quitting smoking is a physical and mental challenge. You will face cravings, withdrawal symptoms, and temptation. Before quitting, work with your health care provider to make a plan that can help you cope. Preparation can help you quit and keep you from giving in. How can I cope with cravings? Cravings usually last for 5-10 minutes. If you get through it, the craving will  pass. Consider taking the following actions to help you cope with cravings:  Keep your mouth busy: ? Chew sugar-free gum. ? Suck on hard candies or a straw. ? Brush your teeth.  Keep your hands and body busy: ? Immediately change to a different activity when you feel a craving. ? Squeeze or play with a ball. ? Do an activity or a hobby, like making bead jewelry, practicing needlepoint, or working with wood. ? Mix up your normal routine. ? Take a short exercise break. Go for a quick walk or run up and down stairs. ? Spend time in public places where smoking is not allowed.  Focus on doing something kind or helpful for someone else.  Call a friend or family member to talk during a craving.  Join a support group.  Call a quit line, such as 1-800-QUIT-NOW.  Talk with your health care provider about medicines that might help you cope with cravings and make quitting easier for you. How can I deal with withdrawal symptoms? Your body may experience negative effects as it tries to get used to not having nicotine in the system. These effects are called withdrawal symptoms. They may include:  Feeling hungrier than normal.  Trouble concentrating.  Irritability.  Trouble sleeping.  Feeling depressed.  Restlessness and agitation.  Craving a cigarette. To manage withdrawal symptoms:  Avoid places, people, and activities that trigger your cravings.  Remember why you want to quit.  Get plenty of sleep.  Avoid coffee and other caffeinated drinks. These may worsen  some of your symptoms. How can I handle social situations? Social situations can be difficult when you are quitting smoking, especially in the first few weeks. To manage this, you can:  Avoid parties, bars, and other social situations where people might be smoking.  Avoid alcohol.  Leave right away if you have the urge to smoke.  Explain to your family and friends that you are quitting smoking. Ask for understanding  and support.  Plan activities with friends or family where smoking is not an option. What are some ways I can cope with stress? Wanting to smoke may cause stress, and stress can make you want to smoke. Find ways to manage your stress. Relaxation techniques can help. For example:  Breathe slowly and deeply, in through your nose and out through your mouth.  Listen to soothing, relaxing music.  Talk with a family member or friend about your stress.  Light a candle.  Soak in a bath or take a shower.  Think about a peaceful place. What are some ways I can prevent weight gain? Be aware that many people gain weight after they quit smoking. However, not everyone does. To keep from gaining weight, have a plan in place before you quit and stick to the plan after you quit. Your plan should include:  Having healthy snacks. When you have a craving, it may help to: ? Eat plain popcorn, crunchy carrots, celery, or other cut vegetables. ? Chew sugar-free gum.  Changing how you eat: ? Eat small portion sizes at meals. ? Eat 4-6 small meals throughout the day instead of 1-2 large meals a day. ? Be mindful when you eat. Do not watch television or do other things that might distract you as you eat.  Exercising regularly: ? Make time to exercise each day. If you do not have time for a long workout, do short bouts of exercise for 5-10 minutes several times a day. ? Do some form of strengthening exercise, like weight lifting, and some form of aerobic exercise, like running or swimming.  Drinking plenty of water or other low-calorie or no-calorie drinks. Drink 6-8 glasses of water daily, or as much as instructed by your health care provider. Summary  Quitting smoking is a physical and mental challenge. You will face cravings, withdrawal symptoms, and temptation to smoke again. Preparation can help you as you go through these challenges.  You can cope with cravings by keeping your mouth busy (such as by  chewing gum), keeping your body and hands busy, and making calls to family, friends, or a helpline for people who want to quit smoking.  You can cope with withdrawal symptoms by avoiding places where people smoke, avoiding drinks with caffeine, and getting plenty of rest.  Ask your health care provider about the different ways to prevent weight gain, avoid stress, and handle social situations. This information is not intended to replace advice given to you by your health care provider. Make sure you discuss any questions you have with your health care provider. Document Revised: 07/31/2017 Document Reviewed: 08/15/2016 Elsevier Patient Education  2020 [employer].

## 2020-06-20 DIAGNOSIS — R0781 Pleurodynia: Secondary | ICD-10-CM | POA: Diagnosis not present

## 2020-06-20 DIAGNOSIS — E7800 Pure hypercholesterolemia, unspecified: Secondary | ICD-10-CM | POA: Diagnosis not present

## 2020-06-20 DIAGNOSIS — E039 Hypothyroidism, unspecified: Secondary | ICD-10-CM | POA: Diagnosis not present

## 2020-06-20 DIAGNOSIS — I10 Essential (primary) hypertension: Secondary | ICD-10-CM | POA: Diagnosis not present

## 2020-06-21 ENCOUNTER — Encounter (HOSPITAL_COMMUNITY): Payer: Self-pay

## 2020-06-21 ENCOUNTER — Other Ambulatory Visit: Payer: Self-pay

## 2020-06-21 ENCOUNTER — Ambulatory Visit (HOSPITAL_COMMUNITY)
Admission: EM | Admit: 2020-06-21 | Discharge: 2020-06-21 | Disposition: A | Discharge status: Home / Self Care | Payer: PPO | Attending: Family Medicine | Admitting: Family Medicine

## 2020-06-21 DIAGNOSIS — R0781 Pleurodynia: Secondary | ICD-10-CM

## 2020-06-21 MED ORDER — TIZANIDINE HCL 4 MG PO TABS: 4.0000 mg | ORAL_TABLET | Freq: Four times a day (QID) | ORAL | 0 refills | Status: DC | PRN

## 2020-06-21 MED ORDER — HYDROCODONE-ACETAMINOPHEN 5-325 MG PO TABS
1.0000 | ORAL_TABLET | Freq: Four times a day (QID) | ORAL | 0 refills | Status: DC | PRN
Start: 2020-06-21 — End: 2020-06-22

## 2020-06-21 NOTE — ED Triage Notes (Signed)
Pt presents with left side torso pain from a fall on Tuesday.

## 2020-06-21 NOTE — Discharge Instructions (Addendum)
Hydrocodone for pain as needed Zanaflex for muscle relaxant. These medicines may make you drowsy.  Heat to the area Recommend stretching

## 2020-06-22 ENCOUNTER — Other Ambulatory Visit: Payer: Self-pay

## 2020-06-22 ENCOUNTER — Encounter (HOSPITAL_BASED_OUTPATIENT_CLINIC_OR_DEPARTMENT_OTHER): Payer: Self-pay | Admitting: Emergency Medicine

## 2020-06-22 ENCOUNTER — Emergency Department (HOSPITAL_BASED_OUTPATIENT_CLINIC_OR_DEPARTMENT_OTHER)
Admission: EM | Admit: 2020-06-22 | Discharge: 2020-06-22 | Disposition: A | Discharge status: Home / Self Care | Payer: PPO | Attending: Emergency Medicine | Admitting: Emergency Medicine

## 2020-06-22 DIAGNOSIS — K5900 Constipation, unspecified: Secondary | ICD-10-CM | POA: Diagnosis present

## 2020-06-22 DIAGNOSIS — I5021 Acute systolic (congestive) heart failure: Secondary | ICD-10-CM | POA: Diagnosis not present

## 2020-06-22 DIAGNOSIS — I25110 Atherosclerotic heart disease of native coronary artery with unstable angina pectoris: Secondary | ICD-10-CM | POA: Insufficient documentation

## 2020-06-22 DIAGNOSIS — F17210 Nicotine dependence, cigarettes, uncomplicated: Secondary | ICD-10-CM | POA: Diagnosis not present

## 2020-06-22 DIAGNOSIS — Z7982 Long term (current) use of aspirin: Secondary | ICD-10-CM | POA: Insufficient documentation

## 2020-06-22 DIAGNOSIS — M545 Low back pain, unspecified: Secondary | ICD-10-CM | POA: Insufficient documentation

## 2020-06-22 DIAGNOSIS — E119 Type 2 diabetes mellitus without complications: Secondary | ICD-10-CM | POA: Diagnosis not present

## 2020-06-22 DIAGNOSIS — Z955 Presence of coronary angioplasty implant and graft: Secondary | ICD-10-CM | POA: Insufficient documentation

## 2020-06-22 DIAGNOSIS — Z79899 Other long term (current) drug therapy: Secondary | ICD-10-CM | POA: Diagnosis not present

## 2020-06-22 DIAGNOSIS — I110 Hypertensive heart disease with heart failure: Secondary | ICD-10-CM | POA: Diagnosis not present

## 2020-06-22 DIAGNOSIS — K5909 Other constipation: Secondary | ICD-10-CM | POA: Diagnosis not present

## 2020-06-22 DIAGNOSIS — K5903 Drug induced constipation: Secondary | ICD-10-CM

## 2020-06-22 DIAGNOSIS — E039 Hypothyroidism, unspecified: Secondary | ICD-10-CM | POA: Insufficient documentation

## 2020-06-22 MED ORDER — POLYETHYLENE GLYCOL 3350 17 G PO PACK
17.0000 g | PACK | Freq: Every day | ORAL | 0 refills | Status: DC
Start: 2020-06-22 — End: 2020-12-05

## 2020-06-22 NOTE — ED Provider Notes (Addendum)
[facility] EMERGENCY DEPARTMENT Provider Note   CSN: [id number] Arrival date & time: 06/22/20  1544     History Chief Complaint  Patient presents with  . Constipation    [facility] is a 69 y.o. male.  The history is provided by the patient and medical records.  Constipation Severity:  Severe Time since last bowel movement:  4 days Timing:  Constant Progression:  Unchanged Chronicity:  New Context: medication and narcotics   Stool description:  None produced Relieved by:  Nothing Worsened by:  Nothing Ineffective treatments:  Defecation and enemas Associated symptoms: back pain   Associated symptoms: no abdominal pain, no diarrhea, no dysuria, no fever, no hematochezia, no nausea and no vomiting        Past Medical History:  Diagnosis Date  . CAD (coronary artery disease) 04/18/2015   a. Lat STEMI 8/16 - LHC:  pLAD 90, mLAD 50, D1 100, dLCx 90, OM1 80, pRCA 75, EF 35-45% >> DES to D1;  b. staged PCI 04/20/15 - DES to mid LAD and DES to prox RCA  . Depression   . DM2 (diabetes mellitus, type 2) ([place])   . HTN (hypertension) 02/08/2013   Renal Artery [name] 9/16: Bilateral RAS 1-59%, SMA >70%  . Hypothyroidism   . Ischemic cardiomyopathy    a. Echo 8/16:  Mild LVH, EF 40-45%, mid-apical anterolateral and apical AK, grade 1 diastolic dysfunction, trivial effusion  . OSA (obstructive sleep apnea) 09/21/2018    Patient Active Problem List   Diagnosis Date Noted  . OSA (obstructive sleep apnea) 09/21/2018  . Coronary artery disease involving native coronary artery of native heart with unstable angina pectoris ([place])   . Type II diabetes mellitus with complication ([place]) 36/46/8032  . Acute systolic heart failure ([place]) 04/19/2015  . Acute MI, lateral wall, initial episode of care ([place]) 04/18/2015  . ST elevation myocardial infarction (STEMI) of lateral wall ([place]) 04/18/2015  . Other male erectile dysfunction 04/18/2014  . Diabetes ([place]) 10/13/2013  . Sciatica  03/15/2013  . Hypothyroidism 02/08/2013  . Essential hypertension 02/08/2013  . Depression 01/25/2013  . Low back pain 01/25/2013    Past Surgical History:  Procedure Laterality Date  . CARDIAC CATHETERIZATION N/A 04/18/2015   Procedure: Left Heart Cath and Coronary Angiography;  Surgeon: Sherren Mocha, MD;  Location: [facility];  Service: Cardiovascular;  Laterality: N/A;  . CARDIAC CATHETERIZATION N/A 04/18/2015   Procedure: Coronary Stent Intervention;  Surgeon: Sherren Mocha, MD;  Location: [facility];  Service: Cardiovascular;  Laterality: N/A;  . CARDIAC CATHETERIZATION N/A 04/20/2015   Procedure: Coronary Stent Intervention;  Surgeon: Burnell Blanks, MD;  Location: [facility];  Service: Cardiovascular;  Laterality: N/A;  . CORONARY STENT PLACEMENT    . no prior surgery         Family History  Problem Relation Age of Onset  . Heart disease Mother   . Cancer Father   . Thyroid disease Sister   . Colon cancer Neg Hx   . Diabetes Neg Hx     Social History   Tobacco Use  . Smoking status: Light Tobacco Smoker    Packs/day: 0.25    Types: Cigarettes  . Smokeless tobacco: Never Used  . Tobacco comment: 1-3 cigarettes per day 12.4.19  Vaping Use  . Vaping Use: Never used  Substance Use Topics  . Alcohol use: Yes  . Drug use: No    Home Medications Prior to Admission medications  Medication Sig Start Date End Date Taking? Authorizing Provider  aspirin EC 81 MG tablet Take 1 tablet by mouth daily.    [provider]  carvedilol (COREG) 12.5 MG tablet Take 1 tablet (12.5 mg total) by mouth 2 (two) times daily. 05/18/20   Richardson Dopp T, PA-C  Cholecalciferol (VITAMIN D3) 125 MCG (5000 UT) CAPS Take 5,000 Units by mouth daily.    [provider]  clopidogrel (PLAVIX) 75 MG tablet Take 1 tablet (75 mg total) by mouth daily. 05/18/20   Richardson Dopp T, PA-C  co-enzyme Q-10 30 MG capsule Take 30 mg by mouth daily.      [provider]  gabapentin (NEURONTIN) 300 MG capsule Take 600 mg by mouth at bedtime. 02/20/20   [provider]  HYDROcodone-acetaminophen (NORCO/VICODIN) 5-325 MG tablet Take 1-2 tablets by mouth every 6 (six) hours as needed. 06/21/20   Orvan July, NP  levothyroxine (SYNTHROID) 125 MCG tablet Take 125 mcg by mouth daily before breakfast.    [provider]  nitroGLYCERIN (NITROSTAT) 0.4 MG SL tablet Place 1 tablet (0.4 mg total) under the tongue every 5 (five) minutes as needed for chest pain. 05/18/20 08/16/20  Richardson Dopp T, PA-C  rosuvastatin (CRESTOR) 10 MG tablet Take 1 tablet (10 mg total) by mouth daily. 05/18/20   Richardson Dopp T, PA-C  tiZANidine (ZANAFLEX) 4 MG tablet Take 1 tablet (4 mg total) by mouth every 6 (six) hours as needed for muscle spasms. 06/21/20   Orvan July, NP    Allergies    Patient has no known allergies.  Review of Systems   Review of Systems  Constitutional: Negative for chills, fatigue and fever.  HENT: Negative for congestion.   Respiratory: Negative for cough, chest tightness, shortness of breath and wheezing.   Cardiovascular: Negative for chest pain.  Gastrointestinal: Positive for constipation. Negative for abdominal pain, diarrhea, hematochezia, nausea and vomiting.  Genitourinary: Positive for flank pain. Negative for dysuria.  Musculoskeletal: Positive for back pain. Negative for neck pain and neck stiffness.  Neurological: Negative for headaches.  Psychiatric/Behavioral: Negative for agitation.  All other systems reviewed and are negative.   Physical Exam Updated Vital Signs BP (!) 150/91 (BP Location: Right Arm)   Pulse 70   Temp 98.3 F (36.8 C) (Oral)   Resp 16   SpO2 94%   Physical Exam Vitals and nursing note reviewed.  Constitutional:      General: He is not in acute distress.    Appearance: He is well-developed. He is not ill-appearing, toxic-appearing or diaphoretic.  HENT:     Head:  Normocephalic and atraumatic.  Eyes:     Conjunctiva/sclera: Conjunctivae normal.  Cardiovascular:     Rate and Rhythm: Normal rate and regular rhythm.     Heart sounds: No murmur heard.   Pulmonary:     Effort: Pulmonary effort is normal. No respiratory distress.     Breath sounds: Normal breath sounds. No wheezing, rhonchi or rales.  Chest:     Chest wall: No tenderness.  Abdominal:     General: Abdomen is flat. Bowel sounds are normal. There is no distension.     Palpations: Abdomen is soft.     Tenderness: There is no abdominal tenderness. There is no right CVA tenderness, left CVA tenderness, guarding or rebound.  Musculoskeletal:        General: Tenderness and signs of injury present.     Cervical back: Neck supple.     Right  lower leg: No edema.     Left lower leg: No edema.  Skin:    General: Skin is warm and dry.     Findings: No erythema.  Neurological:     Mental Status: He is alert.     ED Results / Procedures / Treatments   Labs (all labs ordered are listed, but only abnormal results are displayed) Labs Reviewed - No data to display  EKG None  Radiology No results found.  Procedures Procedures (including critical care time)  Medications Ordered in ED Medications - No data to display  ED Course  I have reviewed the triage vital signs and the nursing notes.  Pertinent labs & imaging results that were available during my care of the patient were reviewed by me and considered in my medical decision making (see chart for details).    MDM Rules/Calculators/A&P                          Skippy I Luse is a 69 y.o. male with a past medical history significant for CAD status post PCI, diabetes, CHF, hypertension, hypothyroidism, and sleep apnea with recent fall and left back pain who presents with constipation.  Patient reports that he had a fall last week and was prescribed Norco and a muscle relaxant for the left back pain.  He reports he still having some  left back pain but stopped having bowel movements after starting the pain medicine.  He reports no abdominal pain whatsoever and no nausea or vomiting.  He reports he still passing gas.  No history of bowel obstruction.  No chest pain.  He denies any new traumatic injuries.  He reports that he is very regular and is distraught because he has not had a bowel movement since Tuesday.  He denies any rectal bleeding or rectal pain.  He reports he has tried using a glycerin suppository and an enema without success.  On exam, lungs clear and chest nontender.  Abdomen is nontender.  Bowel sounds were appreciated.  He had flank tenderness with some muscle spasm palpated.  No midline back tenderness.  No other focal deficits.  He denies any groin symptoms or anal symptoms otherwise.  Due to lack of any abdominal pain, nausea, or vomiting, or distention, low suspicion for obstruction.  Low suspicion for some posttraumatic complication however I am most concerned that the patient's hydrocodone use is causing constipation.  We discussed management and we agreed to have him start MiraLAX and stop taking the hydrocodone.  I suspect with his combination of intervention, he will start having bowel movements.  He is still passing gas and has no discomfort so low suspicion for obstruction or some sort of atypical hernia.  Patient will also be given instructions to follow-up with gastroenterology if this persists.  He will continue the muscle relaxant.  Patient agreed with plan of care and to maintain hydration.  He understood return precautions.  He had no other questions or concerns and was discharged in good condition.  Final Clinical Impression(s) / ED Diagnoses Final diagnoses:  Drug-induced constipation  Constipation due to pain medication    Rx / DC Orders ED Discharge Orders         Ordered    polyethylene glycol (MIRALAX) 17 g packet  Daily        06/22/20 2121          Clinical Impression: 1.  Drug-induced constipation   2. Constipation due  to pain medication     Disposition: Discharge  Condition: Good  I have discussed the results, Dx and Tx plan with the pt(& family if present). He/she/they expressed understanding and agree(s) with the plan. Discharge instructions discussed at great length. Strict return precautions discussed and pt &/or family have verbalized understanding of the instructions. No further questions at time of discharge.    New Prescriptions   POLYETHYLENE GLYCOL (MIRALAX) 17 G PACKET    Take 17 g by mouth daily.    Follow Up: [facility] [place] [id number] [phone]    Gastroenterology, [address] [place] [phone]     [facility] [address] [id number] CX [address] [place] [id number]       Alhaji Mcneal, Gwenyth Allegra, MD 06/22/20 2319    Linell Shawn, Gwenyth Allegra, MD 06/22/20 [phone]

## 2020-06-22 NOTE — ED Provider Notes (Signed)
[facility]    CSN: [id number] Arrival date & time: 06/21/20  1109      History   Chief Complaint Chief Complaint  Patient presents with   Left Side Pain    HPI [facility] is a 69 y.o. male.   Patient is a 69 year old male presents today with left side pain.  Reporting that he had a fall on Tuesday.  Caught himself with his arm pulling the left upper back area.  Did not specifically fall on his back.  No bruising.  No trouble breathing.  Pain worse with certain movements.  Took one of his sisters pain pills which seem to help. No hematuria      Past Medical History:  Diagnosis Date   CAD (coronary artery disease) 04/18/2015   a. Lat STEMI 8/16 - LHC:  pLAD 90, mLAD 50, D1 100, dLCx 81, OM1 80, pRCA 75, EF 35-45% >> DES to D1;  b. staged PCI 04/20/15 - DES to mid LAD and DES to prox RCA   Depression    DM2 (diabetes mellitus, type 2) ([facility])    HTN (hypertension) 02/08/2013   Renal Artery [name] 9/16: Bilateral RAS 1-59%, SMA >70%   Hypothyroidism    Ischemic cardiomyopathy    a. Echo 8/16:  Mild LVH, EF 40-45%, mid-apical anterolateral and apical AK, grade 1 diastolic dysfunction, trivial effusion   OSA (obstructive sleep apnea) 09/21/2018    Patient Active Problem List   Diagnosis Date Noted   OSA (obstructive sleep apnea) 09/21/2018   Coronary artery disease involving native coronary artery of native heart with unstable angina pectoris ([place])    Type II diabetes mellitus with complication ([place]) 32/20/2542   Acute systolic heart failure ([place]) 04/19/2015   Acute MI, lateral wall, initial episode of care ([place]) 04/18/2015   ST elevation myocardial infarction (STEMI) of lateral wall ([place]) 04/18/2015   Other male erectile dysfunction 04/18/2014   Diabetes ([place]) 10/13/2013   Sciatica 03/15/2013   Hypothyroidism 02/08/2013   Essential hypertension 02/08/2013   Depression 01/25/2013   Low back pain 01/25/2013    Past Surgical History:    Procedure Laterality Date   CARDIAC CATHETERIZATION N/A 04/18/2015   Procedure: Left Heart Cath and Coronary Angiography;  Surgeon: Sherren Mocha, MD;  Location: [facility];  Service: Cardiovascular;  Laterality: N/A;   CARDIAC CATHETERIZATION N/A 04/18/2015   Procedure: Coronary Stent Intervention;  Surgeon: Sherren Mocha, MD;  Location: [facility];  Service: Cardiovascular;  Laterality: N/A;   CARDIAC CATHETERIZATION N/A 04/20/2015   Procedure: Coronary Stent Intervention;  Surgeon: Burnell Blanks, MD;  Location: [facility];  Service: Cardiovascular;  Laterality: N/A;   CORONARY STENT PLACEMENT     no prior surgery         Home Medications    Prior to Admission medications   Medication Sig Start Date End Date Taking? Authorizing Provider  aspirin EC 81 MG tablet Take 1 tablet by mouth daily.    [provider]  carvedilol (COREG) 12.5 MG tablet Take 1 tablet (12.5 mg total) by mouth 2 (two) times daily. 05/18/20   Richardson Dopp T, PA-C  Cholecalciferol (VITAMIN D3) 125 MCG (5000 UT) CAPS Take 5,000 Units by mouth daily.    [provider]  clopidogrel (PLAVIX) 75 MG tablet Take 1 tablet (75 mg total) by mouth daily. 05/18/20   Richardson Dopp T, PA-C  co-enzyme Q-10 30 MG capsule Take 30 mg by mouth daily.  [provider]  gabapentin (NEURONTIN) 300 MG capsule Take 600 mg by mouth at bedtime. 02/20/20   [provider]  HYDROcodone-acetaminophen (NORCO/VICODIN) 5-325 MG tablet Take 1-2 tablets by mouth every 6 (six) hours as needed. 06/21/20   Orvan July, NP  levothyroxine (SYNTHROID) 125 MCG tablet Take 125 mcg by mouth daily before breakfast.    [provider]  nitroGLYCERIN (NITROSTAT) 0.4 MG SL tablet Place 1 tablet (0.4 mg total) under the tongue every 5 (five) minutes as needed for chest pain. 05/18/20 08/16/20  Richardson Dopp T, PA-C  rosuvastatin (CRESTOR) 10 MG tablet Take 1 tablet (10 mg total)  by mouth daily. 05/18/20   Richardson Dopp T, PA-C  tiZANidine (ZANAFLEX) 4 MG tablet Take 1 tablet (4 mg total) by mouth every 6 (six) hours as needed for muscle spasms. 06/21/20   Orvan July, NP    Family History Family History  Problem Relation Age of Onset   Heart disease Mother    Cancer Father    Thyroid disease Sister    Colon cancer Neg Hx    Diabetes Neg Hx     Social History Social History   Tobacco Use   Smoking status: Light Tobacco Smoker    Packs/day: 0.25    Types: Cigarettes   Smokeless tobacco: Never Used   Tobacco comment: 1-3 cigarettes per day 12.4.19  Vaping Use   Vaping Use: Never used  Substance Use Topics   Alcohol use: Yes   Drug use: No     Allergies   Patient has no known allergies.   Review of Systems Review of Systems   Physical Exam Triage Vital Signs ED Triage Vitals  Enc Vitals Group     BP 06/21/20 1240 137/80     Pulse Rate 06/21/20 1240 85     Resp 06/21/20 1240 17     Temp 06/21/20 1240 98.2 F (36.8 C)     Temp Source 06/21/20 1240 Oral     SpO2 06/21/20 1240 96 %     Weight --      Height --      Head Circumference --      Peak Flow --      Pain Score 06/21/20 1238 6     Pain Loc --      Pain Edu? --      Excl. in [place]? --    No data found.  Updated Vital Signs BP 137/80 (BP Location: Right Arm)    Pulse 85    Temp 98.2 F (36.8 C) (Oral)    Resp 17    SpO2 96%   Visual Acuity Right Eye Distance:   Left Eye Distance:   Bilateral Distance:    Right Eye Near:   Left Eye Near:    Bilateral Near:     Physical Exam Vitals and nursing note reviewed.  Constitutional:      Appearance: Normal appearance.  HENT:     Head: Normocephalic and atraumatic.     Nose: Nose normal.  Eyes:     Conjunctiva/sclera: Conjunctivae normal.  Pulmonary:     Effort: Pulmonary effort is normal.     Breath sounds: Normal breath sounds.  Musculoskeletal:        General: Normal range of motion.     Cervical back:  Normal range of motion.       Back:     Comments: TTP No bruising, swelling or deformity  Skin:    General: Skin  is warm and dry.  Neurological:     Mental Status: He is alert.  Psychiatric:        Mood and Affect: Mood normal.      UC Treatments / Results  Labs (all labs ordered are listed, but only abnormal results are displayed) Labs Reviewed - No data to display  EKG   Radiology No results found.  Procedures Procedures (including critical care time)  Medications Ordered in UC Medications - No data to display  Initial Impression / Assessment and Plan / UC Course  I have reviewed the triage vital signs and the nursing notes.  Pertinent labs & imaging results that were available during my care of the patient were reviewed by me and considered in my medical decision making (see chart for details).     Rib pain Most likely bad bruise or pulled muscle. No concern for fracture at this time. Zanaflex for muscle relaxant as needed and hydrocodone for pain as needed Recommend heat to the area gentle stretching Follow up as needed for continued or worsening symptoms  Final Clinical Impressions(s) / UC Diagnoses   Final diagnoses:  Rib pain     Discharge Instructions     Hydrocodone for pain as needed Zanaflex for muscle relaxant. These medicines may make you drowsy.  Heat to the area Recommend stretching    ED Prescriptions    Medication Sig Dispense Auth. Provider   tiZANidine (ZANAFLEX) 4 MG tablet Take 1 tablet (4 mg total) by mouth every 6 (six) hours as needed for muscle spasms. 30 tablet Marieta Markov A, NP   HYDROcodone-acetaminophen (NORCO/VICODIN) 5-325 MG tablet Take 1-2 tablets by mouth every 6 (six) hours as needed. 12 tablet Terria Deschepper A, NP     I have reviewed the PDMP during this encounter.   Loura Halt A, NP 06/22/20 1051

## 2020-06-22 NOTE — Discharge Instructions (Signed)
Your history and exam today are consistent with constipation likely caused by the hydrocodone/acetaminophen medication you are taking to help with your musculoskeletal pain after the recent fall.  Please stop taking the hydrocodone medication.  Please start taking MiraLAX.  Please increase your hydration.  You did not have significant abdominal pain, nausea, or vomiting and we have a very low suspicion for obstruction or other intra-abdominal abnormality at this time.  If you develop any of those symptoms or you continue to have worsen constipation or stop passing gas, please return to the nearest emergency department.  Please follow-up with gastroenterology if your symptoms persist as well.

## 2020-06-22 NOTE — ED Triage Notes (Addendum)
Pt states saw a dr on the 21 after a fall  With left flank pain and was given meds and now he is constipated

## 2020-11-23 ENCOUNTER — Other Ambulatory Visit: Payer: Self-pay | Admitting: Cardiovascular Disease

## 2020-12-04 ENCOUNTER — Encounter (HOSPITAL_COMMUNITY): Payer: Self-pay | Admitting: Emergency Medicine

## 2020-12-04 ENCOUNTER — Telehealth: Payer: Self-pay | Admitting: Cardiovascular Disease

## 2020-12-04 ENCOUNTER — Other Ambulatory Visit: Payer: Self-pay

## 2020-12-04 ENCOUNTER — Emergency Department (HOSPITAL_COMMUNITY): Payer: PPO

## 2020-12-04 ENCOUNTER — Emergency Department (HOSPITAL_COMMUNITY)
Admission: EM | Admit: 2020-12-04 | Discharge: 2020-12-04 | Disposition: A | Discharge status: Home / Self Care | Payer: PPO | Attending: Emergency Medicine | Admitting: Emergency Medicine

## 2020-12-04 DIAGNOSIS — Z955 Presence of coronary angioplasty implant and graft: Secondary | ICD-10-CM | POA: Insufficient documentation

## 2020-12-04 DIAGNOSIS — Z7982 Long term (current) use of aspirin: Secondary | ICD-10-CM | POA: Insufficient documentation

## 2020-12-04 DIAGNOSIS — I25110 Atherosclerotic heart disease of native coronary artery with unstable angina pectoris: Secondary | ICD-10-CM | POA: Diagnosis not present

## 2020-12-04 DIAGNOSIS — I5021 Acute systolic (congestive) heart failure: Secondary | ICD-10-CM | POA: Insufficient documentation

## 2020-12-04 DIAGNOSIS — I110 Hypertensive heart disease with heart failure: Secondary | ICD-10-CM | POA: Diagnosis not present

## 2020-12-04 DIAGNOSIS — F17210 Nicotine dependence, cigarettes, uncomplicated: Secondary | ICD-10-CM | POA: Insufficient documentation

## 2020-12-04 DIAGNOSIS — Z79899 Other long term (current) drug therapy: Secondary | ICD-10-CM | POA: Diagnosis not present

## 2020-12-04 DIAGNOSIS — R079 Chest pain, unspecified: Secondary | ICD-10-CM | POA: Diagnosis not present

## 2020-12-04 DIAGNOSIS — R091 Pleurisy: Secondary | ICD-10-CM

## 2020-12-04 DIAGNOSIS — E039 Hypothyroidism, unspecified: Secondary | ICD-10-CM | POA: Diagnosis not present

## 2020-12-04 LAB — TROPONIN I (HIGH SENSITIVITY)
Troponin I (High Sensitivity): 12 ng/L (ref <18)
Troponin I (High Sensitivity): 8 ng/L (ref <18)

## 2020-12-04 LAB — BASIC METABOLIC PANEL
Anion gap: 5 (ref 5–15)
BUN: 22 mg/dL (ref 8–23)
CO2: 28 mmol/L (ref 22–32)
Calcium: 8.7 mg/dL — ABNORMAL LOW (ref 8.9–10.3)
Chloride: 97 mmol/L — ABNORMAL LOW (ref 98–111)
Creatinine, Ser: 1.77 mg/dL — ABNORMAL HIGH (ref 0.61–1.24)
GFR, Estimated: 41 mL/min — ABNORMAL LOW (ref >60)
Glucose, Bld: 190 mg/dL — ABNORMAL HIGH (ref 70–99)
Potassium: 4.2 mmol/L (ref 3.5–5.1)
Sodium: 130 mmol/L — ABNORMAL LOW (ref 135–145)

## 2020-12-04 LAB — CBC
HCT: 49.8 % (ref 39.0–52.0)
Hemoglobin: 15.4 g/dL (ref 13.0–17.0)
MCH: 22.4 pg — ABNORMAL LOW (ref 26.0–34.0)
MCHC: 30.9 g/dL (ref 30.0–36.0)
MCV: 72.3 fL — ABNORMAL LOW (ref 80.0–100.0)
Platelets: 147 10*3/uL — ABNORMAL LOW (ref 150–400)
RBC: 6.89 MIL/uL — ABNORMAL HIGH (ref 4.22–5.81)
RDW: 15.4 % (ref 11.5–15.5)
WBC: 8.4 10*3/uL (ref 4.0–10.5)
nRBC: 0 % (ref 0.0–0.2)

## 2020-12-04 LAB — D-DIMER, QUANTITATIVE: D-Dimer, Quant: 0.32 ug/mL-FEU (ref 0.00–0.50)

## 2020-12-04 MED ORDER — ASPIRIN 81 MG PO CHEW
263.0000 mg | CHEWABLE_TABLET | Freq: Once | ORAL | Status: AC
  Administered 2020-12-04: 263 mg via ORAL
  Filled 2020-12-04: qty 4

## 2020-12-04 NOTE — ED Provider Notes (Signed)
[facility] Provider Note   CSN: [id number] Arrival date & time: 12/04/20  0010     History Chief Complaint  Patient presents with  . Chest Pain    Ritik I Choyce is a 70 y.o. male.  HPI  HPI: A 70 year old patient with a history of treated diabetes, hypertension and hypercholesterolemia presents for evaluation of chest pain. Initial onset of pain was approximately 1-3 hours ago. The patient's chest pain is sharp and is not worse with exertion. The patient's chest pain is middle- or left-sided, is not well-localized, is not described as heaviness/pressure/tightness and does not radiate to the arms/jaw/neck. The patient does not complain of nausea and denies diaphoresis. The patient has a family history of coronary artery disease in a first-degree relative with onset less than age 75. The patient has no history of stroke, has no history of peripheral artery disease, has not smoked in the past 90 days and does not have an elevated BMI (>=30).  Patient with extensive history of coronary disease presents with left-sided chest pain.  Patient reports he was watching a movie when he started having sharp left-sided chest pain in 1 spot.  It is worse with deep breathing and at times with movement.  He has no other associated symptoms.  No fever/vomiting/diaphoresis/shortness of breath.  He reports no recent similar episodes. He is compliant with all of his medications No recent travel or surgery.  No known history of VTE Past Medical History:  Diagnosis Date  . CAD (coronary artery disease) 04/18/2015   a. Lat STEMI 8/16 - LHC:  pLAD 90, mLAD 50, D1 100, dLCx 90, OM1 80, pRCA 75, EF 35-45% >> DES to D1;  b. staged PCI 04/20/15 - DES to mid LAD and DES to prox RCA  . Depression   . DM2 (diabetes mellitus, type 2) ([place])   . HTN (hypertension) 02/08/2013   Renal Artery [name] 9/16: Bilateral RAS 1-59%, SMA >70%  . Hypothyroidism   . Ischemic cardiomyopathy    a. Echo  8/16:  Mild LVH, EF 40-45%, mid-apical anterolateral and apical AK, grade 1 diastolic dysfunction, trivial effusion  . OSA (obstructive sleep apnea) 09/21/2018    Patient Active Problem List   Diagnosis Date Noted  . OSA (obstructive sleep apnea) 09/21/2018  . Coronary artery disease involving native coronary artery of native heart with unstable angina pectoris ([place])   . Type II diabetes mellitus with complication ([place]) 92/42/6834  . Acute systolic heart failure ([place]) 04/19/2015  . Acute MI, lateral wall, initial episode of care ([place]) 04/18/2015  . ST elevation myocardial infarction (STEMI) of lateral wall ([place]) 04/18/2015  . Other male erectile dysfunction 04/18/2014  . Diabetes ([place]) 10/13/2013  . Sciatica 03/15/2013  . Hypothyroidism 02/08/2013  . Essential hypertension 02/08/2013  . Depression 01/25/2013  . Low back pain 01/25/2013    Past Surgical History:  Procedure Laterality Date  . CARDIAC CATHETERIZATION N/A 04/18/2015   Procedure: Left Heart Cath and Coronary Angiography;  Surgeon: Sherren Mocha, MD;  Location: [facility];  Service: Cardiovascular;  Laterality: N/A;  . CARDIAC CATHETERIZATION N/A 04/18/2015   Procedure: Coronary Stent Intervention;  Surgeon: Sherren Mocha, MD;  Location: [facility];  Service: Cardiovascular;  Laterality: N/A;  . CARDIAC CATHETERIZATION N/A 04/20/2015   Procedure: Coronary Stent Intervention;  Surgeon: Burnell Blanks, MD;  Location: [facility];  Service: Cardiovascular;  Laterality: N/A;  . CORONARY STENT PLACEMENT    . no  prior surgery         Family History  Problem Relation Age of Onset  . Heart disease Mother   . Cancer Father   . Thyroid disease Sister   . Colon cancer Neg Hx   . Diabetes Neg Hx     Social History   Tobacco Use  . Smoking status: Light Tobacco Smoker    Packs/day: 0.25    Types: Cigarettes  . Smokeless tobacco: Never Used  . Tobacco comment: 1-3 cigarettes per day 12.4.19   Vaping Use  . Vaping Use: Never used  Substance Use Topics  . Alcohol use: Yes  . Drug use: No    Home Medications Prior to Admission medications   Medication Sig Start Date End Date Taking? Authorizing Provider  aspirin EC 81 MG tablet Take 1 tablet by mouth daily.    [provider]  carvedilol (COREG) 12.5 MG tablet Take 1 tablet (12.5 mg total) by mouth 2 (two) times daily. 05/18/20   Richardson Dopp T, PA-C  Cholecalciferol (VITAMIN D3) 125 MCG (5000 UT) CAPS Take 5,000 Units by mouth daily.    [provider]  clopidogrel (PLAVIX) 75 MG tablet Take 1 tablet (75 mg total) by mouth daily. 05/18/20   Richardson Dopp T, PA-C  co-enzyme Q-10 30 MG capsule Take 30 mg by mouth daily.     [provider]  gabapentin (NEURONTIN) 300 MG capsule Take 600 mg by mouth at bedtime. 02/20/20   [provider]  levothyroxine (SYNTHROID) 125 MCG tablet Take 125 mcg by mouth daily before breakfast.    [provider]  nitroGLYCERIN (NITROSTAT) 0.4 MG SL tablet Place 1 tablet (0.4 mg total) under the tongue every 5 (five) minutes as needed for chest pain. 05/18/20 08/16/20  Richardson Dopp T, PA-C  polyethylene glycol (MIRALAX) 17 g packet Take 17 g by mouth daily. 06/22/20   Tegeler, Gwenyth Allegra, MD  rosuvastatin (CRESTOR) 10 MG tablet TAKE 1 TABLET BY MOUTH DAILY 11/23/20   Sherren Mocha, MD  tiZANidine (ZANAFLEX) 4 MG tablet Take 1 tablet (4 mg total) by mouth every 6 (six) hours as needed for muscle spasms. 06/21/20   Orvan July, NP    Allergies    Patient has no known allergies.  Review of Systems   Review of Systems  Constitutional: Negative for diaphoresis and fever.  Respiratory: Negative for shortness of breath.   Cardiovascular: Positive for chest pain. Negative for leg swelling.  Gastrointestinal: Negative for nausea and vomiting.  Neurological: Negative for weakness.  All other systems reviewed and are negative.   Physical Exam Updated  Vital Signs BP (!) 157/89   Pulse 72   Temp (!) 97.5 F (36.4 C) (Oral)   Resp (!) 24   Ht 1.727 m (5\' 8" )   Wt 89.8 kg   SpO2 93%   BMI 30.10 kg/m   Physical Exam CONSTITUTIONAL: Well developed/well nourished HEAD: Normocephalic/atraumatic EYES: EOMI/PERRL ENMT: Mucous membranes moist NECK: supple no meningeal signs SPINE/BACK:entire spine nontender CV: S1/S2 noted, no murmurs/rubs/gallops noted LUNGS: Lungs are clear to auscultation bilaterally, no apparent distress Chest-no tenderness to chest wall, no crepitus no rash ABDOMEN: soft, nontender, no rebound or guarding, bowel sounds noted throughout abdomen GU:no cva tenderness NEURO: Pt is awake/alert/appropriate, moves all extremitiesx4.  No facial droop.   EXTREMITIES: pulses normal/equal, full ROM, no calf tenderness SKIN: warm, color normal PSYCH: no abnormalities of mood noted, alert and oriented to situation  ED Results / Procedures / Treatments  Labs (all labs ordered are listed, but only abnormal results are displayed) Labs Reviewed  BASIC METABOLIC PANEL - Abnormal; Notable for the following components:      Result Value   Sodium 130 (*)    Chloride 97 (*)    Glucose, Bld 190 (*)    Creatinine, Ser 1.77 (*)    Calcium 8.7 (*)    GFR, Estimated 41 (*)    All other components within normal limits  CBC - Abnormal; Notable for the following components:   RBC 6.89 (*)    MCV 72.3 (*)    MCH 22.4 (*)    Platelets 147 (*)    All other components within normal limits  D-DIMER, QUANTITATIVE  TROPONIN I (HIGH SENSITIVITY)  TROPONIN I (HIGH SENSITIVITY)    EKG EKG Interpretation  Date/Time:  Tuesday December 04 2020 01:20:06 EDT Ventricular Rate:  73 PR Interval:  212 QRS Duration: 109 QT Interval:  396 QTC Calculation: 437 R Axis:   135 Text Interpretation: Sinus rhythm Ventricular premature complex Borderline prolonged PR interval Probable lateral infarct, age indeterminate Anterior infarct, old No  significant change since last tracing Confirmed by Ripley Fraise [phone]) on 12/04/2020 2:09:27 AM   Radiology DG Chest 2 View  Result Date: 12/04/2020 CLINICAL DATA:  Chest pain EXAM: CHEST - 2 VIEW COMPARISON:  August 05, 2018 FINDINGS: The heart size and mediastinal contours are within normal limits. Both lungs are clear. The visualized skeletal structures are unremarkable. Aortic calcifications are noted. IMPRESSION: No active cardiopulmonary disease. Electronically Signed   By: Constance Holster M.D.   On: 12/04/2020 00:46    Procedures Procedures   Medications Ordered in ED Medications  aspirin chewable tablet 263 mg (263 mg Oral Given 12/04/20 0155)    ED Course  I have reviewed the triage vital signs and the nursing notes.  Pertinent labs & imaging results that were available during my care of the patient were reviewed by me and considered in my medical decision making (see chart for details).    MDM Rules/Calculators/A&P HEAR Score: 4                         This patient presents to the ED for concern of chest pain, this involves an extensive number of treatment options, and is a complaint that carries with it a high risk of complications and morbidity.  The differential diagnosis includes acute coronary syndrome, pulmonary embolism, aortic dissection, pneumonia, CHF, pericarditis   Lab Tests:   I Ordered, reviewed, and interpreted labs, which included D-dimer, troponin, complete blood count, metabolic panel  Medicines ordered:   I ordered medication aspirin for chest pain  Imaging Studies ordered:   I ordered imaging studies which included chest x-ray    I independently visualized and interpreted imaging which showed no acute findings  Additional history obtained:    Previous records obtained and reviewed   Reevaluation:  After the interventions stated above, I reevaluated the patient and found patient is improved  . 4:04 AM . Patient presented with  onset of sharp left-sided chest pain that was mostly worse with breathing and movement.  Had no other associated Symptoms.  Work-up is overall unremarkable.  Low suspicion for ACS, and troponins were negative.  Low suspicion for PE/dissection D-dimer negative.  Patient likely with pleurisy.  Advised he can continue aspirin at home and start Tylenol for the next week Final Clinical Impression(s) / ED Diagnoses Final diagnoses:  Pleurisy  Rx / DC Orders ED Discharge Orders    None       Ripley Fraise, MD 12/04/20 [phone]

## 2020-12-04 NOTE — Telephone Encounter (Signed)
Pt states he started having some left sided CP that started last night and has been coming and going.  Currently not having CP.  Denies SOB, dizziness, lightheadedness, diaphoresis or HA. Went to ER last night and was sent home after several hours.  CXR fine.  Did not use Nitro last night.  States pain only occurs when he's doing deep breathing exercises and holds his breath for a few minutes.  Discomfort much better today.  Nothing makes it worse or better other than holding breath.  Pt would really like an appt with Dr. Burt Knack or Richardson Dopp, PA-C.

## 2020-12-04 NOTE — Telephone Encounter (Signed)
Pt c/o of Chest Pain: STAT if CP now or developed within 24 hours  1. Are you having CP right now? Only when taking deep breathe when holds breathe  2. Are you experiencing any other symptoms (ex. SOB, nausea, vomiting, sweating)? no  3. How long have you been experiencing CP? Started 9 pm yesterday  4. Is your CP continuous or coming and going? Hurts during deep breathes, comes and goes on the left side  5. Have you taken Nitroglycerin? No, forgot about it   Patient states he gets chest pain when he takes a deep breathe and holds it. She states he went to the ED last night.

## 2020-12-04 NOTE — ED Provider Notes (Signed)
MSE was initiated and I personally evaluated the patient and placed orders (if any) at  12:25 AM on December 04, 2020.  Patient here with sudden onset left-sided chest pain that began about 2 hours ago.  He reports that it is worsened with movement.  It is not affected patient he denies shortness of breath, diaphoresis, nausea.  He took (1) 81 mg aspirin.  Has stents.  Sees Dr. Burt Knack.  Vitals:   12/04/20 0022  BP: (!) 179/106  Pulse: 66  Resp: 18  Temp: (!) 97.5 F (36.4 C)  SpO2: 99%     Discussed with patient that their care has been initiated.   They are counseled that they will need to remain in the ED until the completion of their workup, including full H&P and results of any tests.  Risks of leaving the emergency department prior to completion of treatment were discussed. Patient was advised to inform ED staff if they are leaving before their treatment is complete. The patient acknowledged these risks and time was allowed for questions.    The patient appears stable so that the remainder of the MSE may be completed by another provider.    Montine Circle, PA-C 12/04/20 1561    Merryl Hacker, MD 12/04/20 [phone]

## 2020-12-04 NOTE — ED Triage Notes (Addendum)
Pt reports left sided chest X2 hours.  Extensive hx of cardiac issues.

## 2020-12-04 NOTE — Telephone Encounter (Signed)
Scheduled the patient for visit with Dr. Burt Knack tomorrow.  He was grateful for call and agrees with plan.

## 2020-12-05 ENCOUNTER — Encounter: Payer: Self-pay | Admitting: Cardiovascular Disease

## 2020-12-05 ENCOUNTER — Ambulatory Visit (INDEPENDENT_AMBULATORY_CARE_PROVIDER_SITE_OTHER): Payer: PPO | Admitting: Cardiovascular Disease

## 2020-12-05 VITALS — BP 162/80 | HR 68 | Ht 68.0 in | Wt 208.2 lb

## 2020-12-05 DIAGNOSIS — E118 Type 2 diabetes mellitus with unspecified complications: Secondary | ICD-10-CM

## 2020-12-05 DIAGNOSIS — I5022 Chronic systolic (congestive) heart failure: Secondary | ICD-10-CM

## 2020-12-05 DIAGNOSIS — I25118 Atherosclerotic heart disease of native coronary artery with other forms of angina pectoris: Secondary | ICD-10-CM | POA: Diagnosis not present

## 2020-12-05 DIAGNOSIS — R079 Chest pain, unspecified: Secondary | ICD-10-CM

## 2020-12-05 DIAGNOSIS — E782 Mixed hyperlipidemia: Secondary | ICD-10-CM

## 2020-12-05 DIAGNOSIS — I10 Essential (primary) hypertension: Secondary | ICD-10-CM | POA: Diagnosis not present

## 2020-12-05 DIAGNOSIS — Z720 Tobacco use: Secondary | ICD-10-CM | POA: Diagnosis not present

## 2020-12-05 NOTE — Patient Instructions (Signed)
Medication Instructions:  Your provider recommends that you continue on your current medications as directed. Please refer to the Current Medication list given to you today.   *If you need a refill on your cardiac medications before your next appointment, please call your pharmacy*  Testing/Procedures: Your provider has requested that you have a lexiscan myoview. For further information please visit [email or web address]. Please follow instruction sheet, as given.  Follow-Up: At [facility], you and your health needs are our priority.  As part of our continuing mission to provide you with exceptional heart care, we have created designated Provider Care Teams.  These Care Teams include your primary Cardiologist (physician) and Advanced Practice Providers (APPs -  Physician Assistants and Nurse Practitioners) who all work together to provide you with the care you need, when you need it. Your next appointment:   12 month(s) The format for your next appointment:   In Person Provider:   You may see Sherren Mocha, MD or one of the following Advanced Practice Providers on your designated Care Team:    Richardson Dopp, PA-C  [place], [place]

## 2020-12-05 NOTE — Progress Notes (Signed)
Cardiology Office Note:    Date:  12/05/2020   ID:  ELVA BREAKER, DOB 1951-08-18, MRN [id number]  PCP:  Sandi Mariscal, [place]  Cardiologist:  Sherren Mocha, MD  Advanced Practice Provider:  No care team member to display Electrophysiologist:  None       Referring MD: Sandi Mariscal, MD   Chief Complaint  Patient presents with  . Chest Pain  . Coronary Artery Disease    History of Present Illness:    Nicolas Ray is a 70 y.o. male with a hx of:  Coronary artery disease  ? S/p Lat STEMI 8/16 >> s/p DES to D1  Patient refused CABG >> staged PCI: DES to mid LAD and DES to prox RCA  Chronic systolic CHF ? Ischemic CM ? ACEi DCd in past due to worsening Creatinine  ? Echocardiogram 8/16: EF 40-45 ? Echocardiogram 5/17: EF 35-40  Diabetes mellitus   Chronic kidney disease 3  Hypertension   Medical non-adherence   Tobacco use  R hemidiaphragm paralysis  Mild OSA  The patient presents today after evaluation in the emergency room for chest pain.  He developed resting left-sided chest discomfort about 48 hours ago.  Symptoms persisted for 3 to 4 hours.  He ultimately went to the emergency room where troponins were checked and they were normal.  His pain was felt to be atypical.  He was discharged home with close follow-up recommended.  The patient did not have substernal discomfort but it was more left-sided.  It was worse with taking a deep breath and holding it.  There was no orthopnea, PND, edema, or shortness of breath.  He has had no heart palpitations.  He occasionally has discomfort with physical exertion but difficult to obtain a clear history on this.  He denies fevers or chills.   Past Medical History:  Diagnosis Date  . CAD (coronary artery disease) 04/18/2015   a. Lat STEMI 8/16 - LHC:  pLAD 90, mLAD 50, D1 100, dLCx 90, OM1 80, pRCA 75, EF 35-45% >> DES to D1;  b. staged PCI 04/20/15 - DES to mid LAD and DES to prox RCA  . Depression   .  DM2 (diabetes mellitus, type 2) ([place])   . HTN (hypertension) 02/08/2013   Renal Artery [name] 9/16: Bilateral RAS 1-59%, SMA >70%  . Hypothyroidism   . Ischemic cardiomyopathy    a. Echo 8/16:  Mild LVH, EF 40-45%, mid-apical anterolateral and apical AK, grade 1 diastolic dysfunction, trivial effusion  . OSA (obstructive sleep apnea) 09/21/2018    Past Surgical History:  Procedure Laterality Date  . CARDIAC CATHETERIZATION N/A 04/18/2015   Procedure: Left Heart Cath and Coronary Angiography;  Surgeon: Sherren Mocha, MD;  Location: [facility];  Service: Cardiovascular;  Laterality: N/A;  . CARDIAC CATHETERIZATION N/A 04/18/2015   Procedure: Coronary Stent Intervention;  Surgeon: Sherren Mocha, MD;  Location: [facility];  Service: Cardiovascular;  Laterality: N/A;  . CARDIAC CATHETERIZATION N/A 04/20/2015   Procedure: Coronary Stent Intervention;  Surgeon: Burnell Blanks, MD;  Location: [facility];  Service: Cardiovascular;  Laterality: N/A;  . CORONARY STENT PLACEMENT    . no prior surgery      Current Medications: Current Meds  Medication Sig  . aspirin EC 81 MG tablet Take 1 tablet by mouth daily.  . carvedilol (COREG) 12.5 MG tablet Take 1 tablet (12.5 mg total) by mouth 2 (two) times daily.  Marland Kitchen  Cholecalciferol (VITAMIN D3) 125 MCG (5000 UT) CAPS Take 5,000 Units by mouth See admin instructions. 2-3 times a week  . clopidogrel (PLAVIX) 75 MG tablet Take 1 tablet (75 mg total) by mouth daily.  Marland Kitchen co-enzyme Q-10 30 MG capsule Take 30 mg by mouth daily.   Marland Kitchen gabapentin (NEURONTIN) 300 MG capsule Take 300 mg by mouth at bedtime.  Marland Kitchen levothyroxine (SYNTHROID) 125 MCG tablet Take 125 mcg by mouth daily before breakfast.  . rosuvastatin (CRESTOR) 10 MG tablet TAKE 1 TABLET BY MOUTH DAILY (Patient taking differently: Take 10 mg by mouth daily.)     Allergies:   Patient has no known allergies.   Social History   Socioeconomic History  . Marital status: Divorced     Spouse name: Not on file  . Number of children: Not on file  . Years of education: Not on file  . Highest education level: Not on file  Occupational History  . Not on file  Tobacco Use  . Smoking status: Light Tobacco Smoker    Packs/day: 0.25    Types: Cigarettes  . Smokeless tobacco: Never Used  . Tobacco comment: 1-3 cigarettes per day 12.4.19  Vaping Use  . Vaping Use: Never used  Substance and Sexual Activity  . Alcohol use: Yes  . Drug use: No  . Sexual activity: Not on file  Other Topics Concern  . Not on file  Social History Narrative  . Not on file   Social Determinants of Health   Financial Resource Strain: Not on file  Food Insecurity: Not on file  Transportation Needs: Not on file  Physical Activity: Not on file  Stress: Not on file  Social Connections: Not on file     Family History: The patient's family history includes Cancer in his father; Heart disease in his mother; Thyroid disease in his sister. There is no history of Colon cancer or Diabetes.  ROS:   Please see the history of present illness.    All other systems reviewed and are negative.  EKGs/Labs/Other Studies Reviewed:    The following studies were reviewed today: Echo 12/31/2015: Study Conclusions   - Left ventricle: The cavity size was moderately dilated. Wall  thickness was increased in a pattern of moderate LVH. Systolic  function was moderately reduced. The estimated ejection fraction  was in the range of 35% to 40%. Akinesis of the apical  myocardium. Doppler parameters are consistent with abnormal left  ventricular relaxation (grade 1 diastolic dysfunction).  - Pericardium, extracardiac: A trivial pericardial effusion was  identified.   EKG:  EKG is not ordered today.    Recent Labs: 12/04/2020: BUN 22; Creatinine, Ser 1.77; Hemoglobin 15.4; Platelets 147; Potassium 4.2; Sodium 130  Recent Lipid Panel    Component Value Date/Time   CHOL 106 06/29/2019 0953   TRIG  107 06/29/2019 0953   HDL 36 (L) 06/29/2019 0953   CHOLHDL 2.9 06/29/2019 0953   CHOLHDL 3.6 06/16/2016 0833   VLDL 26 06/16/2016 0833   LDLCALC 50 06/29/2019 0953     Risk Assessment/Calculations:       Physical Exam:    VS:  BP (!) 162/80   Pulse 68   Ht 5\' 8"  (1.727 m)   Wt 208 lb 3.2 oz (94.4 kg)   BMI 31.66 kg/m     Wt Readings from Last 3 Encounters:  12/05/20 208 lb 3.2 oz (94.4 kg)  12/04/20 197 lb 15.6 oz (89.8 kg)  05/18/20 198 lb (89.8 kg)  GEN:  Well nourished, well developed in no acute distress HEENT: Normal NECK: No JVD; No carotid bruits LYMPHATICS: No lymphadenopathy CARDIAC: RRR, no murmurs, rubs, gallops RESPIRATORY:  Clear to auscultation without rales, wheezing or rhonchi  ABDOMEN: Soft, non-tender, non-distended MUSCULOSKELETAL:  No edema; No deformity  SKIN: Warm and dry NEUROLOGIC:  Alert and oriented x 3 PSYCHIATRIC:  Normal affect   ASSESSMENT:    1. Coronary artery disease involving native coronary artery of native heart with other form of angina pectoris ([place])   2. Chronic systolic heart failure ([place])   3. Essential hypertension   4. Tobacco abuse   5. Type II diabetes mellitus with complication (HCC)   6. Mixed hyperlipidemia   7. Chest pain, unspecified type    PLAN:    In order of problems listed above:  1. The patient has chest discomfort with typical and atypical features.  Somewhat difficult to characterize his symptoms.  He has multivessel coronary disease, chronic kidney disease, and multiple ongoing CV risk factors including tobacco use.  I have recommended a Lexiscan Myoview stress test for further risk stratification. 2. Reassess LV function with his nuclear scan.  Continue carvedilol.  He has been intolerant to ACE/ARB.  He has not been inclined to take further medical therapy.  I emphasized the importance of taking his carvedilol twice daily.  No evidence of volume overload on exam. 3. Continue carvedilol.  The  patient states that his blood pressure on home readings has been in good range. 4. Not ready to quit.  Does not feel this is important.  Says he smokes just a few cigarettes each day. 5. States that this is cured after he changed his diet.  Blood sugar in the emergency room is 190. 6. Treated with rosuvastatin. 7. Plan as above, check Lexiscan Myoview stress test.  Continue aspirin, clopidogrel, carvedilol, and rosuvastatin.   Shared Decision Making/Informed Consent The risks [chest pain, shortness of breath, cardiac arrhythmias, dizziness, blood pressure fluctuations, myocardial infarction, stroke/transient ischemic attack, nausea, vomiting, allergic reaction, radiation exposure, metallic taste sensation and life-threatening complications (estimated to be 1 in 10,000)], benefits (risk stratification, diagnosing coronary artery disease, treatment guidance) and alternatives of a nuclear stress test were discussed in detail with Mr. Rucinski and he agrees to proceed.       Medication Adjustments/Labs and Tests Ordered: Current medicines are reviewed at length with the patient today.  Concerns regarding medicines are outlined above.  Orders Placed This Encounter  Procedures  . MYOCARDIAL PERFUSION IMAGING   No orders of the defined types were placed in this encounter.   Patient Instructions  Medication Instructions:  Your provider recommends that you continue on your current medications as directed. Please refer to the Current Medication list given to you today.   *If you need a refill on your cardiac medications before your next appointment, please call your pharmacy*  Testing/Procedures: Your provider has requested that you have a lexiscan myoview. For further information please visit [email or web address]. Please follow instruction sheet, as given.  Follow-Up: At [facility], you and your health needs are our priority.  As part of our continuing mission to provide you with exceptional  heart care, we have created designated Provider Care Teams.  These Care Teams include your primary Cardiologist (physician) and Advanced Practice Providers (APPs -  Physician Assistants and Nurse Practitioners) who all work together to provide you with the care you need, when you need it. Your next appointment:   12 month(s) The format for  your next appointment:   In Person Provider:   You may see Sherren Mocha, MD or one of the following Advanced Practice Providers on your designated Care Team:    Richardson Dopp, PA-C  Robbie Lis, [place]      Signed, Sherren Mocha, MD  12/05/2020 5:13 PM    [place]

## 2020-12-13 ENCOUNTER — Telehealth (HOSPITAL_COMMUNITY): Payer: Self-pay | Admitting: *Deleted

## 2020-12-13 NOTE — Telephone Encounter (Signed)
Left message on voicemail per DPR in reference to upcoming appointment scheduled on 12/17/20 at 10:30 with detailed instructions given per Myocardial Perfusion Study Information Sheet for the test. LM to arrive 15 minutes early, and that it is imperative to arrive on time for appointment to keep from having the test rescheduled. If you need to cancel or reschedule your appointment, please call the office within 24 hours of your appointment. Failure to do so may result in a cancellation of your appointment, and a $50 no show fee. Phone number given for call back for any questions.

## 2020-12-17 ENCOUNTER — Ambulatory Visit (HOSPITAL_COMMUNITY): Payer: PPO | Attending: Cardiology

## 2020-12-17 ENCOUNTER — Other Ambulatory Visit: Payer: Self-pay

## 2020-12-17 DIAGNOSIS — R079 Chest pain, unspecified: Secondary | ICD-10-CM | POA: Diagnosis not present

## 2020-12-17 LAB — MYOCARDIAL PERFUSION IMAGING
LV dias vol: 189 mL (ref 62–150)
LV sys vol: 147 mL
Peak HR: 84 {beats}/min
Rest HR: 68 {beats}/min
SDS: 2
SRS: 17
SSS: 18
TID: 1.11

## 2020-12-17 MED ORDER — TECHNETIUM TC 99M TETROFOSMIN IV KIT
31.3000 | PACK | Freq: Once | INTRAVENOUS | Status: AC | PRN
  Administered 2020-12-17: 31.3 via INTRAVENOUS
  Filled 2020-12-17: qty 32

## 2020-12-17 MED ORDER — TECHNETIUM TC 99M TETROFOSMIN IV KIT
10.8000 | PACK | Freq: Once | INTRAVENOUS | Status: AC | PRN
  Administered 2020-12-17: 10.8 via INTRAVENOUS
  Filled 2020-12-17: qty 11

## 2020-12-17 MED ORDER — REGADENOSON 0.4 MG/5ML IV SOLN
0.4000 mg | Freq: Once | INTRAVENOUS | Status: AC
  Administered 2020-12-17: 0.4 mg via INTRAVENOUS

## 2020-12-27 ENCOUNTER — Telehealth: Payer: Self-pay

## 2020-12-27 DIAGNOSIS — I25118 Atherosclerotic heart disease of native coronary artery with other forms of angina pectoris: Secondary | ICD-10-CM

## 2020-12-27 NOTE — Telephone Encounter (Signed)
Sherren Mocha, MD  12/26/2020 6:37 AM EDT      There is no ischemia but LV function has declined, now in the severe range by nuclear scan. Medical therapy has been limited by patient's comorbid conditions and drug intolerances.   Recommend: 2D echo to assess LV function, FOV with me or Scott after echo to review options of medical therapy and further evaluation   thx    Spoke with the patient in detail about results. He is irritated the echo wasn't ordered prior. It was explained to him several times there was no indication until low EF on myoview. Offered the patient echo 5/2 and visit with Dr.Cooper 5/6. He did not want to wake up early for 9:35AM appointment. Scheduled the patient for echo 01/24/21. Instead of scheduling follow-up visit at this time, he will await results of echo and see if he needs to come back in.  He was grateful for call.

## 2021-01-10 ENCOUNTER — Ambulatory Visit (HOSPITAL_COMMUNITY)
Admission: RE | Admit: 2021-01-10 | Discharge: 2021-01-10 | Disposition: A | Discharge status: Home / Self Care | Payer: PPO | Source: Ambulatory Visit | Attending: Internal Medicine | Admitting: Internal Medicine

## 2021-01-10 ENCOUNTER — Other Ambulatory Visit: Payer: Self-pay

## 2021-01-10 DIAGNOSIS — Z5321 Procedure and treatment not carried out due to patient leaving prior to being seen by health care provider: Secondary | ICD-10-CM

## 2021-01-10 NOTE — ED Notes (Signed)
Pt left as he called and asked if Loura Pardon, NP will be here today, as he set up and appointment and he wanted to be seeing by Loura Pardon, NP. Pt states he spoke with Wells Guiles and Dominic and was told Loura Pardon, NP will be here at 1200 to see him at his 1200 appointment.   This staff offered and explained we have other medical provider can see him.   Otila Kluver, RN, spoke with pt and explained Loura Pardon, NP is not longer at this location. Pt verbalized understanding.

## 2021-01-24 ENCOUNTER — Ambulatory Visit (HOSPITAL_COMMUNITY): Payer: PPO | Attending: Cardiology

## 2021-01-24 ENCOUNTER — Other Ambulatory Visit: Payer: Self-pay

## 2021-01-24 DIAGNOSIS — I25118 Atherosclerotic heart disease of native coronary artery with other forms of angina pectoris: Secondary | ICD-10-CM | POA: Diagnosis not present

## 2021-01-24 LAB — ECHOCARDIOGRAM COMPLETE
AR max vel: 1.57 cm2
AV Area VTI: 1.79 cm2
AV Area mean vel: 1.42 cm2
AV Mean grad: 6 mmHg
AV Peak grad: 10.9 mmHg
Ao pk vel: 1.65 m/s
Area-P 1/2: 3.31 cm2
S' Lateral: 3.7 cm

## 2021-01-25 DIAGNOSIS — H9201 Otalgia, right ear: Secondary | ICD-10-CM | POA: Diagnosis not present

## 2021-01-29 DIAGNOSIS — M79605 Pain in left leg: Secondary | ICD-10-CM | POA: Diagnosis not present

## 2021-01-29 DIAGNOSIS — I10 Essential (primary) hypertension: Secondary | ICD-10-CM | POA: Diagnosis not present

## 2021-01-29 DIAGNOSIS — E039 Hypothyroidism, unspecified: Secondary | ICD-10-CM | POA: Diagnosis not present

## 2021-01-29 DIAGNOSIS — M79604 Pain in right leg: Secondary | ICD-10-CM | POA: Diagnosis not present

## 2021-01-29 DIAGNOSIS — E1165 Type 2 diabetes mellitus with hyperglycemia: Secondary | ICD-10-CM | POA: Diagnosis not present

## 2021-01-29 DIAGNOSIS — E7800 Pure hypercholesterolemia, unspecified: Secondary | ICD-10-CM | POA: Diagnosis not present

## 2021-01-29 DIAGNOSIS — E559 Vitamin D deficiency, unspecified: Secondary | ICD-10-CM | POA: Diagnosis not present

## 2021-01-29 DIAGNOSIS — Z79899 Other long term (current) drug therapy: Secondary | ICD-10-CM | POA: Diagnosis not present

## 2021-02-06 ENCOUNTER — Ambulatory Visit (INDEPENDENT_AMBULATORY_CARE_PROVIDER_SITE_OTHER): Payer: PPO | Admitting: Podiatry

## 2021-02-06 ENCOUNTER — Ambulatory Visit: Payer: BLUE CROSS/BLUE SHIELD

## 2021-02-06 ENCOUNTER — Other Ambulatory Visit: Payer: Self-pay

## 2021-02-06 DIAGNOSIS — R601 Generalized edema: Secondary | ICD-10-CM

## 2021-02-12 ENCOUNTER — Encounter: Payer: Self-pay | Admitting: Podiatry

## 2021-02-12 NOTE — Progress Notes (Signed)
Subjective:  Patient ID: Nicolas Ray, male    DOB: 09-Jan-1951,  MRN: [id number]  No chief complaint on file.   70 y.o. male presents with the above complaint.  Patient presents with complaint of bilateral lower leg edema achiness.  Patient states it hurts with ambulation.  He does not have any pain however he feels like tightness and compression on the distal leg.  He states that there is sometimes stiffness and burning sensation associated with it.  He has not seen anyone else prior to seeing me.  He does not have any pain.  He denies any other acute complaints.  He is swollen.  He is a light smoker.  He is a diabetic with last A1c of 7.7   Review of Systems: Negative except as noted in the HPI. Denies N/V/F/Ch.  Past Medical History:  Diagnosis Date   CAD (coronary artery disease) 04/18/2015   a. Lat STEMI 8/16 - LHC:  pLAD 20, mLAD 50, D1 100, dLCx 44, OM1 80, pRCA 75, EF 35-45% >> DES to D1;  b. staged PCI 04/20/15 - DES to mid LAD and DES to prox RCA   Depression    DM2 (diabetes mellitus, type 2) (H[facility])    HTN (hypertension) 02/08/2013   Renal Artery [name] 9/16: Bilateral RAS 1-59%, SMA >70%   Hypothyroidism    Ischemic cardiomyopathy    a. Echo 8/16:  Mild LVH, EF 40-45%, mid-apical anterolateral and apical AK, grade 1 diastolic dysfunction, trivial effusion   OSA (obstructive sleep apnea) 09/21/2018    Current Outpatient Medications:    aspirin EC 81 MG tablet, Take 1 tablet by mouth daily., Disp: , Rfl:    carvedilol (COREG) 12.5 MG tablet, Take 1 tablet (12.5 mg total) by mouth 2 (two) times daily., Disp: 180 tablet, Rfl: 3   Cholecalciferol (VITAMIN D3) 125 MCG (5000 UT) CAPS, Take 5,000 Units by mouth See admin instructions. 2-3 times a week, Disp: , Rfl:    clopidogrel (PLAVIX) 75 MG tablet, Take 1 tablet (75 mg total) by mouth daily., Disp: 90 tablet, Rfl: 3   co-enzyme Q-10 30 MG capsule, Take 30 mg by mouth daily. , Disp: , Rfl:    gabapentin (NEURONTIN) 300 MG capsule, Take  300 mg by mouth at bedtime., Disp: , Rfl:    levothyroxine (SYNTHROID) 125 MCG tablet, Take 125 mcg by mouth daily before breakfast., Disp: , Rfl:    nitroGLYCERIN (NITROSTAT) 0.4 MG SL tablet, Place 1 tablet (0.4 mg total) under the tongue every 5 (five) minutes as needed for chest pain., Disp: 25 tablet, Rfl: 5   rosuvastatin (CRESTOR) 10 MG tablet, TAKE 1 TABLET BY MOUTH DAILY (Patient taking differently: Take 10 mg by mouth daily.), Disp: 90 tablet, Rfl: 1  Social History   Tobacco Use  Smoking Status Light Smoker   Packs/day: 0.25   Pack years: 0.00   Types: Cigarettes  Smokeless Tobacco Never  Tobacco Comments   1-3 cigarettes per day 12.4.19    No Known Allergies Objective:  There were no vitals filed for this visit. There is no height or weight on file to calculate BMI. Constitutional Well developed. Well nourished.  Vascular Dorsalis pedis pulses palpable bilaterally. Posterior tibial pulses palpable bilaterally. Capillary refill normal to all digits.  No cyanosis or clubbing noted. Pedal hair growth normal.  Neurologic Normal speech. Oriented to person, place, and time. Epicritic sensation to light touch grossly present bilaterally.  Dermatologic Nails well groomed and normal in appearance. No open  wounds. No skin lesions.  Orthopedic: Severe 2+ pitting edema noted to bilateral legs.  No open ulceration noted.  No weeping wounds noted.  No clinical signs of infection noted.   Radiographs: None Assessment:   1. Generalized edema    Plan:  Patient was evaluated and treated and all questions answered.  Bilateral lower extremity edema with underlying internal contusion of the muscle -I explained to the patient the etiology of edema and various treatment options were discussed.  I discussed with the patient that given the amount of edema that he has present to both of his legs this could be providing internal pressure to different compartments of the leg leading to  the achiness that he is feeling especially with tightening around the legs.  I discussed with him aggressive elevation as well as compression socks.  He states understanding will begin that immediately.  No follow-ups on file.

## 2021-02-25 ENCOUNTER — Telehealth: Payer: Self-pay | Admitting: Cardiovascular Disease

## 2021-02-25 DIAGNOSIS — M7022 Olecranon bursitis, left elbow: Secondary | ICD-10-CM | POA: Diagnosis not present

## 2021-02-25 DIAGNOSIS — M25522 Pain in left elbow: Secondary | ICD-10-CM | POA: Diagnosis not present

## 2021-02-25 DIAGNOSIS — I7090 Unspecified atherosclerosis: Secondary | ICD-10-CM

## 2021-02-25 DIAGNOSIS — F17200 Nicotine dependence, unspecified, uncomplicated: Secondary | ICD-10-CM

## 2021-02-25 NOTE — Telephone Encounter (Signed)
New Message:    Pt said he saw Dr Burt Knack about 2 weeks ago. He said Dr Burt Knack asked how his legs were doing and he said good. He sad two days later they started feeling funny, not hurting him. He keep saying that he needs to see Dr Burt Knack for 5 minutes. He said he wanted to talk to [facility] about this, because he needs to see Dr Burt Knack for 5 minutes.

## 2021-02-26 NOTE — Telephone Encounter (Signed)
Legs are more stiff and he feels like the color has changed, more pale. No other symptoms.  Patient states at his last ov he was taking a BP medication once a day and the doctor told him to take it twice a day. After that,  two days later he noticed this change. The patient doesn't have any blood pressure readings. The patient keeps asking to put him in for 5 minutes with Dr. Burt Knack this week or next. Will forward to Dr. Burt Knack for advisement.

## 2021-02-26 NOTE — Telephone Encounter (Signed)
PT is calling back again requesting a callback asap

## 2021-03-01 NOTE — Telephone Encounter (Signed)
Patient notified.  He would like to proceed with ultrasound studies of his legs.  Patient aware this will be at our [facility] office.  Address provided.  I told patient he would be called with appointment information. Patient does not want to see APP.  Appointment scheduled with Dr Burt Knack.

## 2021-03-01 NOTE — Telephone Encounter (Signed)
The patient has risk factors for PAD.  It would be reasonable to do ultrasound studies of his legs to make sure that he does not have arterial insufficiency.  I be fine to see him in the next available clinic spot, but probably would not be able to see him in the next few weeks based on the current schedule.  Alternatively if he feels he needs to be seen sooner, he could schedule with an APP. thanks

## 2021-03-22 ENCOUNTER — Ambulatory Visit (HOSPITAL_COMMUNITY)
Admission: RE | Admit: 2021-03-22 | Discharge: 2021-03-22 | Disposition: A | Discharge status: Home / Self Care | Payer: PPO | Source: Ambulatory Visit | Attending: Cardiovascular Disease | Admitting: Cardiovascular Disease

## 2021-03-22 ENCOUNTER — Other Ambulatory Visit: Payer: Self-pay

## 2021-03-22 DIAGNOSIS — F17200 Nicotine dependence, unspecified, uncomplicated: Secondary | ICD-10-CM | POA: Diagnosis not present

## 2021-03-22 DIAGNOSIS — I7090 Unspecified atherosclerosis: Secondary | ICD-10-CM | POA: Diagnosis not present

## 2021-03-27 ENCOUNTER — Telehealth: Payer: Self-pay | Admitting: Cardiovascular Disease

## 2021-03-27 DIAGNOSIS — E118 Type 2 diabetes mellitus with unspecified complications: Secondary | ICD-10-CM

## 2021-03-27 NOTE — Telephone Encounter (Signed)
The patient has been notified of the result and verbalized understanding.  All questions (if any) were answered.   Pt did want to run by Dr. Burt Knack that he is still having issues with "pin like pricks" to both his lower extremities.  He states he has no edema, bilaterally.  He voiced no other cardiac complaints.  Pt states he stopped taking his coreg, for he associates this medication with causing his leg pains.  Pt education provided about this, and that he should restart back his coreg as prescribed, for pain in his legs are not a common side effect from this med.  Asked the pt how he is managing his diabetes, and he reports just by eating healthy.  Advised him that he should get in with his PCP for further management of his diabetes, and possible diabetes related neuropathy.  Pt education provided on the importance of this. Advised him to be compliant with taking all his medications, especially his cardiac meds.  Advised him to monitor his pressures, as Dr. Burt Knack wanted him to. Informed the pt that I will run this by Dr. Burt Knack for further advisement, but he needs to get in with his PCP, for diabetes management and leg pains.  Pt verbalized understanding and agrees with this plan.

## 2021-03-27 NOTE — Telephone Encounter (Signed)
-----   Message from Sherren Mocha, MD sent at 03/27/2021  7:22 AM EDT ----- No evidence of lower extremity arterial disease

## 2021-03-27 NOTE — Telephone Encounter (Signed)
Patient was returning phone call 

## 2021-03-28 NOTE — Telephone Encounter (Signed)
Referral was sent to [facility]. Office number given to patient.

## 2021-03-28 NOTE — Telephone Encounter (Signed)
Left a message for the patient to call back.  Sherren Mocha, MD  Cv [facility] Preop 59 minutes ago (8:46 AM)     Reviewed notes. Agree he should follow-up with PCP. I don't think his symptoms are related to medication side effects. Thank you

## 2021-03-28 NOTE — Telephone Encounter (Signed)
Recommend [facility]. I don't have a specific provider to recommend. thanks

## 2021-03-28 NOTE — Telephone Encounter (Signed)
Called and reviewed recommendations with pt. He states he needs a new PCP because "my current primary care doctor makes me come into the office too much." I offered to make a referral to primary care for him, but he insists Dr. Burt Knack make the recommendation.  I will forward to Dr. Burt Knack for review and referral.

## 2021-04-02 ENCOUNTER — Ambulatory Visit: Payer: PPO | Admitting: Family Medicine

## 2021-05-20 DIAGNOSIS — E113413 Type 2 diabetes mellitus with severe nonproliferative diabetic retinopathy with macular edema, bilateral: Secondary | ICD-10-CM | POA: Diagnosis not present

## 2021-05-20 DIAGNOSIS — H25813 Combined forms of age-related cataract, bilateral: Secondary | ICD-10-CM | POA: Diagnosis not present

## 2021-07-23 ENCOUNTER — Other Ambulatory Visit: Payer: Self-pay | Admitting: Physician Assistant

## 2021-08-08 ENCOUNTER — Other Ambulatory Visit: Payer: Self-pay

## 2021-08-08 ENCOUNTER — Ambulatory Visit (INDEPENDENT_AMBULATORY_CARE_PROVIDER_SITE_OTHER): Payer: Medicare Other | Admitting: Family Medicine

## 2021-08-08 ENCOUNTER — Encounter: Payer: Self-pay | Admitting: Family Medicine

## 2021-08-08 VITALS — BP 142/80 | HR 72 | Temp 97.0°F | Ht 67.5 in | Wt 200.8 lb

## 2021-08-08 DIAGNOSIS — E785 Hyperlipidemia, unspecified: Secondary | ICD-10-CM | POA: Insufficient documentation

## 2021-08-08 DIAGNOSIS — E118 Type 2 diabetes mellitus with unspecified complications: Secondary | ICD-10-CM

## 2021-08-08 DIAGNOSIS — I5022 Chronic systolic (congestive) heart failure: Secondary | ICD-10-CM | POA: Diagnosis not present

## 2021-08-08 DIAGNOSIS — E038 Other specified hypothyroidism: Secondary | ICD-10-CM | POA: Diagnosis not present

## 2021-08-08 DIAGNOSIS — R399 Unspecified symptoms and signs involving the genitourinary system: Secondary | ICD-10-CM | POA: Insufficient documentation

## 2021-08-08 DIAGNOSIS — N200 Calculus of kidney: Secondary | ICD-10-CM | POA: Insufficient documentation

## 2021-08-08 DIAGNOSIS — N1832 Chronic kidney disease, stage 3b: Secondary | ICD-10-CM | POA: Diagnosis not present

## 2021-08-08 DIAGNOSIS — N184 Chronic kidney disease, stage 4 (severe): Secondary | ICD-10-CM | POA: Insufficient documentation

## 2021-08-08 DIAGNOSIS — E782 Mixed hyperlipidemia: Secondary | ICD-10-CM

## 2021-08-08 DIAGNOSIS — Z955 Presence of coronary angioplasty implant and graft: Secondary | ICD-10-CM | POA: Insufficient documentation

## 2021-08-08 DIAGNOSIS — R600 Localized edema: Secondary | ICD-10-CM | POA: Diagnosis not present

## 2021-08-08 DIAGNOSIS — I110 Hypertensive heart disease with heart failure: Secondary | ICD-10-CM | POA: Insufficient documentation

## 2021-08-08 DIAGNOSIS — I252 Old myocardial infarction: Secondary | ICD-10-CM | POA: Insufficient documentation

## 2021-08-08 DIAGNOSIS — F17200 Nicotine dependence, unspecified, uncomplicated: Secondary | ICD-10-CM | POA: Diagnosis not present

## 2021-08-08 DIAGNOSIS — I10 Essential (primary) hypertension: Secondary | ICD-10-CM

## 2021-08-08 DIAGNOSIS — I25110 Atherosclerotic heart disease of native coronary artery with unstable angina pectoris: Secondary | ICD-10-CM | POA: Diagnosis not present

## 2021-08-08 LAB — URINALYSIS, ROUTINE W REFLEX MICROSCOPIC
Bilirubin Urine: NEGATIVE
Ketones, ur: NEGATIVE
Nitrite: NEGATIVE
Specific Gravity, Urine: 1.02 (ref 1.000–1.030)
Total Protein, Urine: >=300 — AB
Urine Glucose: NEGATIVE
Urobilinogen, UA: 0.2 (ref 0.0–1.0)
pH: 8 (ref 5.0–8.0)

## 2021-08-08 LAB — COMPREHENSIVE METABOLIC PANEL
ALT: 12 U/L (ref 0–53)
AST: 15 U/L (ref 0–37)
Albumin: 3.8 g/dL (ref 3.5–5.2)
Alkaline Phosphatase: 70 U/L (ref 39–117)
BUN: 41 mg/dL — ABNORMAL HIGH (ref 6–23)
CO2: 27 mEq/L (ref 19–32)
Calcium: 9.3 mg/dL (ref 8.4–10.5)
Chloride: 100 mEq/L (ref 96–112)
Creatinine, Ser: 1.9 mg/dL — ABNORMAL HIGH (ref 0.40–1.50)
GFR: 35.34 mL/min — ABNORMAL LOW (ref >60.00)
Glucose, Bld: 145 mg/dL — ABNORMAL HIGH (ref 70–99)
Potassium: 4.5 mEq/L (ref 3.5–5.1)
Sodium: 133 mEq/L — ABNORMAL LOW (ref 135–145)
Total Bilirubin: 1.3 mg/dL — ABNORMAL HIGH (ref 0.2–1.2)
Total Protein: 7.4 g/dL (ref 6.0–8.3)

## 2021-08-08 LAB — HEMOGLOBIN A1C: Hgb A1c MFr Bld: 8.6 % — ABNORMAL HIGH (ref 4.6–6.5)

## 2021-08-08 LAB — LIPID PANEL
Cholesterol: 125 mg/dL (ref 0–200)
HDL: 38.4 mg/dL — ABNORMAL LOW (ref >39.00)
LDL Cholesterol: 61 mg/dL (ref 0–99)
NonHDL: 86.35
Total CHOL/HDL Ratio: 3
Triglycerides: 125 mg/dL (ref 0.0–149.0)
VLDL: 25 mg/dL (ref 0.0–40.0)

## 2021-08-08 LAB — MICROALBUMIN / CREATININE URINE RATIO
Creatinine,U: 78.8 mg/dL
Microalb Creat Ratio: 297.5 mg/g — ABNORMAL HIGH (ref 0.0–30.0)
Microalb, Ur: 234.5 mg/dL — ABNORMAL HIGH (ref 0.0–1.9)

## 2021-08-08 MED ORDER — FUROSEMIDE 20 MG PO TABS: 20.0000 mg | ORAL_TABLET | Freq: Every day | ORAL | 3 refills | Status: DC

## 2021-08-08 NOTE — Progress Notes (Signed)
[facility] [address] [place] [address] Dept: [phone] Dept Fax: [phone]  New Patient Office Visit  Subjective:    Patient ID: Alda Berthold, male    DOB: Jul 18, 1951, 70 y.o..   MRN: [id number]  Chief Complaint  Patient presents with   Establish Care    NP-establish care.  Declines flu shot    History of Present Illness:  Patient is in today to establish care. Mr. Dubose was born in [place]. His family moved to the [name] when he was 70 years old. He attended [employer] 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 however, his father died and he was not able to complete his education. Mr. Frayre comes from a large family (8 boys, 2 girls). He had a brother living in [place].who was able to assist in getting him a job with the [employer]. he worked there 4 years. 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 he came back to [place] about 4 years ago. he is divorced. He has 3 daughters (between 2-35 yo). Mr. Kovar admits to smoking 2-3 cigarettes a day. He only very rarely drinks alcohol and denies drug use.  Mr. Feutz has a history of coronary artery disease. He suffered an STEMI in 2016. He refused CABG. he ended up having to DES placed in the mid LAD and proximal RCA. Additionally, he has chronic systolic heart failure. His last echocardiogram showed a decreased EF of 35-40%. He has been managed by Dr. Burt Knack (cardiology). He has been recommended to be on aspirin, Plavix, carvedilol and rosuvastatin. Mr. Kallal states he hasn't been taking his aspirin as he felt it was unnecessary. He notes at his last visit with Dr. Burt Knack, he was told to double his carvedilol (from 12.5 mg daily to 25 mg daily). He notes that soon after taking this higher dose, he suddenly developed swelling in his lower legs and the skin turned black. He stopped taking the carvedilol all together and notes  his swelling has gone away. His understanding was that the carvedilol was for treatment of his hypertension, so he wonders about other medications he could take for his blood pressure.  Mr. Sargeant record shows he has a history of hypothyroidism and is managed on levothyroxine. He says he guesses this is true  Mr. Mangold states other doctors have tried to Frank him into believing he has diabetes. he notes when he was told of this, he made changes to his diet and reduced his portion sizes and has been able to make this go away. he is not on any specific treatment for this. He is also very adamant about not wanting to take large amounts of medications. He maintains that he feels okay, therefore, he must be fine. he also states that he does not want to discuss cancer screening and feels this is all unnecessary.  Mr. Ferrante notes he is being prescribed gabapentin at bedtime to help with his sleep. he finds this gives him 6 hours of uninterrupted sleep, which he finds satisfactory.  Mr. Allston complains of occasional pin-like brief sharp sensations at the right ankle. He associates this with when he eats honey and when he eats apples. he wonders about issues with blood flow to his legs and asks about neuropathy.  Past Medical History: Patient Active Problem List   Diagnosis Date Noted   Chronic kidney disease, stage 3 unspecified ([place]) 08/08/2021  Hyperlipidemia, unspecified 08/08/2021   Hypertensive heart disease without congestive heart failure 08/08/2021   Kidney stone 08/08/2021   Lower urinary tract symptoms 08/08/2021   Smoker 08/08/2021   Stented coronary artery 08/08/2021   OSA (obstructive sleep apnea) 09/21/2018   BPH with urinary obstruction 04/14/2016   Bilateral hydronephrosis 01/08/2016   Coronary artery disease involving native coronary artery of native heart with unstable angina pectoris ([place])    Type II diabetes mellitus with complication ([place]) 03/00/9233   Acute systolic heart  failure ([place]) 04/19/2015   Acute MI, lateral wall, initial episode of care ([place]) 04/18/2015   ST elevation myocardial infarction (STEMI) of lateral wall ([place]) 04/18/2015   Other male erectile dysfunction 04/18/2014   Sciatica 03/15/2013   Hypothyroidism 02/08/2013   Essential hypertension 02/08/2013   Depression 01/25/2013   Low back pain 01/25/2013   Past Surgical History:  Procedure Laterality Date   CARDIAC CATHETERIZATION N/A 04/18/2015   Procedure: Left Heart Cath and Coronary Angiography;  Surgeon: Sherren Mocha, MD;  Location: [facility];  Service: Cardiovascular;  Laterality: N/A;   CARDIAC CATHETERIZATION N/A 04/18/2015   Procedure: Coronary Stent Intervention;  Surgeon: Sherren Mocha, MD;  Location: [facility];  Service: Cardiovascular;  Laterality: N/A;   CARDIAC CATHETERIZATION N/A 04/20/2015   Procedure: Coronary Stent Intervention;  Surgeon: Burnell Blanks, MD;  Location: [facility];  Service: Cardiovascular;  Laterality: N/A;   CORONARY STENT PLACEMENT     Family History  Problem Relation Age of Onset   Heart disease Mother    Cancer Father        Neck   Thyroid disease Sister    Stroke Brother    Colon cancer Neg Hx    Diabetes Neg Hx    Outpatient Medications Prior to Visit  Medication Sig Dispense Refill   aspirin EC 81 MG tablet Take 1 tablet by mouth daily. Maybe once a month     Cholecalciferol (VITAMIN D3) 125 MCG (5000 UT) CAPS Take 5,000 Units by mouth See admin instructions. 2-3 times a week     clopidogrel (PLAVIX) 75 MG tablet TAKE 1 TABLET(75 MG) BY MOUTH DAILY 90 tablet 1   co-enzyme Q-10 30 MG capsule Take 30 mg by mouth daily.      gabapentin (NEURONTIN) 300 MG capsule Take 300 mg by mouth at bedtime.     levothyroxine (SYNTHROID) 125 MCG tablet Take 125 mcg by mouth daily before breakfast.     nitroGLYCERIN (NITROSTAT) 0.4 MG SL tablet Place 1 tablet (0.4 mg total) under the tongue every 5 (five) minutes as needed  for chest pain. 25 tablet 5   rosuvastatin (CRESTOR) 10 MG tablet TAKE 1 TABLET BY MOUTH DAILY (Patient taking differently: Take 10 mg by mouth daily.) 90 tablet 1   carvedilol (COREG) 12.5 MG tablet TAKE 1 TABLET(12.5 MG) BY MOUTH TWICE DAILY (Patient not taking: Reported on 08/08/2021) 180 tablet 1   No facility-administered medications prior to visit.   No Known Allergies    Objective:   Today's Vitals   08/08/21 1032  BP: (!) 142/80  Pulse: 72  Temp: (!) 97 F (36.1 C)  TempSrc: Temporal  SpO2: 97%  Weight: 200 lb 12.8 oz (91.1 kg)  Height: 5' 7.5" (1.715 m)   Body mass index is 30.99 kg/m.   General: Well developed, well nourished. No acute distress. Lungs: Clear to auscultation bilaterally. No wheezing, rales or rhonchi. CV: RRR without murmurs or rubs. Pulses 2+ bilaterally.  Extremities: 1+ edema of both lower legs. The skin is shiny and has some old scarring and mild-to-moderate hyperpigmentation. Psych: Alert and oriented. Normal mood and affect.  Health Maintenance Due  Topic Date Due   COVID-19 Vaccine (1) Never done   OPHTHALMOLOGY EXAM  Never done   Hepatitis C Screening  Never done   TETANUS/TDAP  Never done   Zoster Vaccines- Shingrix (1 of 2) Never done   Pneumonia Vaccine 56+ Years old (2 - PCV) 04/20/2016   FOOT EXAM  08/07/2017   HEMOGLOBIN A1C  07/15/2018   COLONOSCOPY (Pts 45-70yr Insurance coverage will need to be confirmed)  10/24/2018   URINE MICROALBUMIN  01/13/2019   INFLUENZA VACCINE  04/01/2021   Imaging: Echocardiogram ( 01/24/2021) IMPRESSIONS    1. Left ventricular ejection fraction, by estimation, is 35 to 40%. The left ventricle has moderately decreased function. The left ventricle  demonstrates global hypokinesis. Left ventricular diastolic parameters are consistent with Grade I diastolic  dysfunction (impaired relaxation). Elevated left ventricular end-diastolic pressure. There is akinesis of the left ventricular, apical segment.  There  is akinesis of the left ventricular, apical septal wall.   2. Right ventricular systolic function is normal. The right ventricular size is normal.   3. The mitral valve is normal in structure. Trivial mitral valve regurgitation. No evidence of mitral stenosis.   4. The aortic valve has an indeterminant number of cusps. There is moderate calcification of the aortic valve. Aortic valve regurgitation is  not visualized. No aortic stenosis is present.   5. The inferior vena cava is normal in size with greater than 50% respiratory variability, suggesting right atrial pressure of 3 mmHg.    Assessment & Plan:   1. Essential hypertension The blood pressure is above goal. However, Mr. NBianchinihas been off of his carvedilol. His cardiologist notes he has been intolerant of ACE-I and ARBs. I recommended he restart his carvedilol at least at the previous dose of 12.5 mg daily.  2. Coronary artery disease involving native coronary artery of native heart with unstable angina pectoris ([facility] I discussed with Mr. NMarneyabout my concern for him having stopped his aspirin. I told him that I did feel that this was important for secondary prevention of another MI. He should also continue his clopidogrel. I advised him that he should stop smoking and noted this would have more impact on lowering his risk for another MI as all fo the other efforts that he says he has made to improve his diet and physical activity. He notes a lack of concern for this.  3. Chronic systolic heart failure ([place] I discussed with Mr. NSnowballthe importance of  beta blockers, such as his carvedilol, in titrating his underlying heart failure and reducing risk for fatal arrhythmias after an MI. Although, there are other classes of medications for managing hypertension, carvedilol would be a preferred agent in light of his CHF and CAD.  4. Other specified hypothyroidism I will check his TSH to assess his current adequacy of his levothyroxine  dose. If he were undertreated, this could contribute to his pedal edema.  - TSH  5. Type II diabetes mellitus with complication ([facility] Mr. NKrupinskidoes not believe that he still has diabetes. His last HbA1c was in 2019 and was 7.7, which was above goal. he is agreeable to letting me check his labs, but strongly opposed to taking medication for this.  - Microalbumin / creatinine urine ratio - Hemoglobin A1c - Urinalysis, Routine w  reflex microscopic  6. Chronic kidney disease, stage 3b [facility]) Mr. Leeth last eGFR was 7. His blood pressure is above goal to reduce further renal damage and he likely ahs uncontrolled diabetes as well. I will rechek his renal function today.  - Comprehensive metabolic panel  7. Mixed hyperlipidemia Due for repeat lipids to make sure rosuvastatin dosing is adequate. - Lipid panel  8. Smoker Advised smoking cessation as noted above. Patient is not willing to quit at this time.  9. Pedal edema Despite Mr. Cardinal noting his edema had gone away, I find mild bilateral lower leg edema with chronic venous stasis dermatitis. I recommend we check some labs, but suspect his underlying heart failure is contributing to this condition. I recommend we start a low dose of furosemide for management. Mr. Higginbotham is very concerned about taking a "water pill" and being trapped at home due to urinating more frequently. He states he is willing to try this. I will check hm back in a month.  - furosemide (LASIX) 20 MG tablet; Take 1 tablet (20 mg total) by mouth daily.  Dispense: 30 tablet; Refill: 3 - Comprehensive metabolic panel - TSH  Haydee Salter, MD

## 2021-08-09 ENCOUNTER — Encounter: Payer: Self-pay | Admitting: Family Medicine

## 2021-08-09 LAB — TSH: TSH: 16.35 u[IU]/mL — ABNORMAL HIGH (ref 0.35–5.50)

## 2021-08-09 NOTE — Progress Notes (Signed)
Nicolas Ray is elevated, suggesting he needs an increase in his levothyroxine dose. He has an increased A1c indicating uncontrolled diabetes. He has an increased microalbumin/creatinine ratio and a decrease GFR consistent with stage G3b/A2 chronic kidney disease.  His lipids are at goal.  Please ask him to schedule an appointment for within the week so I can review his results with him.

## 2021-08-30 ENCOUNTER — Encounter: Payer: Self-pay | Admitting: Cardiovascular Disease

## 2021-08-30 ENCOUNTER — Other Ambulatory Visit: Payer: Self-pay

## 2021-08-30 ENCOUNTER — Ambulatory Visit (INDEPENDENT_AMBULATORY_CARE_PROVIDER_SITE_OTHER): Payer: Medicare Other | Admitting: Cardiovascular Disease

## 2021-08-30 VITALS — BP 138/78 | HR 67 | Ht 67.5 in | Wt 204.0 lb

## 2021-08-30 DIAGNOSIS — E782 Mixed hyperlipidemia: Secondary | ICD-10-CM | POA: Diagnosis not present

## 2021-08-30 DIAGNOSIS — N1832 Chronic kidney disease, stage 3b: Secondary | ICD-10-CM

## 2021-08-30 DIAGNOSIS — I5022 Chronic systolic (congestive) heart failure: Secondary | ICD-10-CM | POA: Diagnosis not present

## 2021-08-30 DIAGNOSIS — I2510 Atherosclerotic heart disease of native coronary artery without angina pectoris: Secondary | ICD-10-CM | POA: Diagnosis not present

## 2021-08-30 DIAGNOSIS — E118 Type 2 diabetes mellitus with unspecified complications: Secondary | ICD-10-CM | POA: Diagnosis not present

## 2021-08-30 NOTE — Progress Notes (Signed)
Cardiology Office Note:    Date:  08/30/2021   ID:  Nicolas Ray, DOB August 24, 1951, MRN [id number]  PCP:  Nicolas Salter, MD   [facility] Providers Cardiologist:  None     Referring MD: Nicolas Mariscal, MD   Chief Complaint  Patient presents with   Coronary Artery Disease    History of Present Illness:    Nicolas Ray is a 70 y.o. male with a hx of: Coronary artery disease  S/p Lat STEMI 8/16 >> s/p DES to D1 Patient refused CABG >> staged PCI: DES to mid LAD and DES to prox RCA Chronic systolic CHF Ischemic CM ACEi DCd in past due to worsening Creatinine  Echocardiogram 8/16: EF 40-45 Echocardiogram 5/17: EF 35-40 Diabetes mellitus  Chronic kidney disease 3 Hypertension  Medical non-adherence  Tobacco use R hemidiaphragm paralysis Mild OSA  The patient is here alone today.  He conveys that after his last appointment when I recommended to increase his carvedilol to twice daily dosing as prescribed, the patient developed severe edema of the right leg.  He stopped the carvedilol within a few days and his symptoms improved.  He has some skin changes in the lower legs bilaterally that he thinks is related to those few extra doses of carvedilol.  He was sent for vascular studies which showed normal ABIs bilaterally.  He also complains of a stabbing sensation that occurs in the chest on occasion.  He has not had any precordial pressure or substernal pressure.  He has no exertional symptoms.  He has recently established with Dr. Gena Ray after not being happy with his previous 3 or 4 primary care physicians.  Past Medical History:  Diagnosis Date   Acute MI, lateral wall, initial episode of care ([place]) 04/18/2015   CAD (coronary artery disease) 04/18/2015   a. Lat STEMI 8/16 - LHC:  pLAD 90, mLAD 50, D1 100, dLCx 97, OM1 80, pRCA 75, EF 35-45% >> DES to D1;  b. staged PCI 04/20/15 - DES to mid LAD and DES to prox RCA   Depression    DM2 (diabetes mellitus, type 2) ([place])    HTN (hypertension)  02/08/2013   Renal Artery [name] 9/16: Bilateral RAS 1-59%, SMA >70%   Hypothyroidism    Ischemic cardiomyopathy    a. Echo 8/16:  Mild LVH, EF 40-45%, mid-apical anterolateral and apical AK, grade 1 diastolic dysfunction, trivial effusion   OSA (obstructive sleep apnea) 09/21/2018   ST elevation myocardial infarction (STEMI) of lateral wall ([place]) 04/18/2015    Past Surgical History:  Procedure Laterality Date   CARDIAC CATHETERIZATION N/A 04/18/2015   Procedure: Left Heart Cath and Coronary Angiography;  Surgeon: Nicolas Mocha, MD;  Location: [facility];  Service: Cardiovascular;  Laterality: N/A;   CARDIAC CATHETERIZATION N/A 04/18/2015   Procedure: Coronary Stent Intervention;  Surgeon: Nicolas Mocha, MD;  Location: [facility];  Service: Cardiovascular;  Laterality: N/A;   CARDIAC CATHETERIZATION N/A 04/20/2015   Procedure: Coronary Stent Intervention;  Surgeon: Nicolas Blanks, MD;  Location: [facility];  Service: Cardiovascular;  Laterality: N/A;   CORONARY STENT PLACEMENT      Current Medications: Current Meds  Medication Sig   aspirin EC 81 MG tablet Take 1 tablet by mouth daily. Maybe once a month   carvedilol (COREG) 12.5 MG tablet TAKE 1 TABLET(12.5 MG) BY MOUTH TWICE DAILY   Cholecalciferol (VITAMIN D3) 125 MCG (5000 UT) CAPS Take 5,000 Units by mouth See admin  instructions. 2-3 times a week   clopidogrel (PLAVIX) 75 MG tablet TAKE 1 TABLET(75 MG) BY MOUTH DAILY   co-enzyme Q-10 30 MG capsule Take 30 mg by mouth daily.    furosemide (LASIX) 20 MG tablet Take 1 tablet (20 mg total) by mouth daily.   gabapentin (NEURONTIN) 300 MG capsule Take 300 mg by mouth at bedtime.   levothyroxine (SYNTHROID) 125 MCG tablet Take 125 mcg by mouth daily before breakfast.   rosuvastatin (CRESTOR) 10 MG tablet TAKE 1 TABLET BY MOUTH DAILY (Patient taking differently: Take 10 mg by mouth daily.)     Allergies:   Patient has no known allergies.   Social History    Socioeconomic History   Marital status: Divorced    Spouse name: Not on file   Number of children: 3   Years of education: Not on file   Highest education level: Not on file  Occupational History   Occupation: Retired- Press photographer  Tobacco Use   Smoking status: Light Smoker    Packs/day: 0.25    Types: Cigarettes   Smokeless tobacco: Never   Tobacco comments:    1-3 cigarettes per day 12.4.19  Vaping Use   Vaping Use: Never used  Substance and Sexual Activity   Alcohol use: Yes   Drug use: No   Sexual activity: Not on file  Other Topics Concern   Not on file  Social History Narrative   Not on file   Social Determinants of Health   Financial Resource Strain: Not on file  Food Insecurity: Not on file  Transportation Needs: Not on file  Physical Activity: Not on file  Stress: Not on file  Social Connections: Not on file     Family History: The patient's family history includes Cancer in his father; Heart disease in his mother; Stroke in his brother; Thyroid disease in his sister. There is no history of Colon cancer or Diabetes.  ROS:   Please see the history of present illness.    All other systems reviewed and are negative.  EKGs/Labs/Other Studies Reviewed:    The following studies were reviewed today: Myoview Scan 12/17/20: Nuclear stress EF: 22%. The left ventricular ejection fraction is severely decreased (<30%). There was no ST segment deviation noted during stress. Defect 1: There is a large defect of severe severity present in the basal inferolateral, mid anterior, mid anteroseptal, mid inferolateral, apical anterior, apical septal and apex location. Findings consistent with prior myocardial infarction. This is a high risk study.   Abnormal, high risk stress nuclear study with distal septal, apical and inferior lateral infarct.  There is no ischemia noted.  Gated ejection fraction 22% with global hypokinesis and akinesis of the distal anteroseptal wall and  apex.  Severe left ventricular enlargement.  Study interpreted as high risk due to reduced LV function.  Cardiac Cath 2016: Prox LAD lesion, 90% stenosed. Mid LAD lesion, 50% stenosed. 1st Mrg lesion, 80% stenosed. Dist Cx lesion, 90% stenosed. Prox RCA lesion, 75% stenosed. There is moderate left ventricular systolic dysfunction. 1st Diag lesion, 100% stenosed. There is a 0% residual stenosis post intervention. A drug-eluting stent was placed.   Final conclusions: Total occlusion of the first diagonal treated successfully with primary PCI using a long DES platform Severe multivessel CAD with severe LAD stenosis, severe OM and distal LCx stenosis, and moderately severe ulcerated plaque in the proximal RCA Moderate segmental LV systolic dysfunction c/w an acute lateral wall (diagonal) infarct   Recommendations: Need to consider revascularization  options (multivessel stenting versus CABG). The patient appears to be noncompliant based on history and current interaction. Will review with colleagues and discuss options further with patient.   Vascular US 03/22/21: Summary:  Right: Resting right ankle-brachial index is within normal range. No  evidence of significant right lower extremity arterial disease. The right  toe-brachial index is normal.   Left: Resting left ankle-brachial index is within normal range. No  evidence of significant left lower extremity arterial disease. The left  toe-brachial index is normal.    Recent Labs: 12/04/2020: Hemoglobin 15.4; Platelets 147 08/08/2021: ALT 12; BUN 41; Creatinine, Ser 1.90; Potassium 4.5; Sodium 133; TSH 16.35  Recent Lipid Panel    Component Value Date/Time   CHOL 125 08/08/2021 1141   CHOL 106 06/29/2019 0953   TRIG 125.0 08/08/2021 1141   HDL 38.40 (L) 08/08/2021 1141   HDL 36 (L) 06/29/2019 0953   CHOLHDL 3 08/08/2021 1141   VLDL 25.0 08/08/2021 1141   LDLCALC 61 08/08/2021 1141   LDLCALC 50 06/29/2019 0953     Risk  Assessment/Calculations:           Physical Exam:    VS:  BP 138/78    Pulse 67    Ht 5' 7.5" (1.715 m)    Wt 204 lb (92.5 kg)    SpO2 99%    BMI 31.48 kg/m     Wt Readings from Last 3 Encounters:  08/30/21 204 lb (92.5 kg)  08/08/21 200 lb 12.8 oz (91.1 kg)  12/17/20 208 lb (94.3 kg)     GEN:  Well nourished, well developed in no acute distress HEENT: Normal NECK: No JVD; No carotid bruits LYMPHATICS: No lymphadenopathy CARDIAC: RRR, no murmurs, rubs, gallops RESPIRATORY:  Clear to auscultation without rales, wheezing or rhonchi  ABDOMEN: Soft, non-tender, non-distended MUSCULOSKELETAL: Trace bilateral pretibial edema; No deformity.  DP pulses 2+ and equal bilaterally SKIN: Warm and dry, mild stasis dermatitis involving both ankles NEUROLOGIC:  Alert and oriented x 3 PSYCHIATRIC:  Normal affect   ASSESSMENT:    1. Coronary artery disease involving native coronary artery of native heart without angina pectoris   2. Chronic systolic heart failure ([place])   3. Mixed hyperlipidemia   4. Type II diabetes mellitus with complication (HCC)   5. Stage 3b chronic kidney disease (CKD) ([place])    PLAN:    In order of problems listed above:  The patient will continue on clopidogrel.  He does not require any antianginal drugs. He has moderate LV dysfunction with New York Heart Association functional class I-II symptoms.  Normally he would be on multidrug therapy (GDMT).  The patient has intolerances and side effects to multiple medicines.  He could not take an ACE inhibitor or ARB.  He could not take carvedilol as prescribed.  I reviewed the rationale for medical therapy with drugs such as Delene Loll, Farxiga, and spironolactone.  Ultimately I decided it was in his best interest not to prescribe any new medications. Treated with rosuvastatin.  LDL cholesterol 61. The patient has denied diabetes despite counseling to the contrary in the past.  His hemoglobin A1c is 8.6.  He has upcoming  follow-up with his primary physician. Likely diabetic nephropathy.  The patient appears clinically stable.  He has been willing to take clopidogrel and rosuvastatin.  He will be continued on these medications.  Medication compliance continues to be challenging and I think the best approach is to minimize his medications.      Medication Adjustments/Labs  and Tests Ordered: Current medicines are reviewed at length with the patient today.  Concerns regarding medicines are outlined above.  No orders of the defined types were placed in this encounter.  No orders of the defined types were placed in this encounter.   Patient Instructions  Medication Instructions:  Your physician recommends that you continue on your current medications as directed. Please refer to the Current Medication list given to you today.  *If you need a refill on your cardiac medications before your next appointment, please call your pharmacy*   Lab Work: None today     Testing/Procedures: None    Follow-Up: At [facility], you and your health needs are our priority.  As part of our continuing mission to provide you with exceptional heart care, we have created designated Provider Care Teams.  These Care Teams include your primary Cardiologist (physician) and Advanced Practice Providers (APPs -  Physician Assistants and Nurse Practitioners) who all work together to provide you with the care you need, when you need it.  We recommend signing up for the patient portal called "MyChart".  Sign up information is provided on this After Visit Summary.  MyChart is used to connect with patients for Virtual Visits (Telemedicine).  Patients are able to view lab/test results, encounter notes, upcoming appointments, etc.  Non-urgent messages can be sent to your provider as well.   To learn more about what you can do with MyChart, go to [email or web address].    Your next appointment:   1 year(s)  The format for your next  appointment:   In Person  Provider:   6 months with Richardson Dopp PA-C  1 year with Dr. Burt Knack.       Signed, Nicolas Mocha, MD  08/30/2021 5:16 PM    [place]

## 2021-08-30 NOTE — Patient Instructions (Signed)
Medication Instructions:  Your physician recommends that you continue on your current medications as directed. Please refer to the Current Medication list given to you today.  *If you need a refill on your cardiac medications before your next appointment, please call your pharmacy*   Lab Work: None today     Testing/Procedures: None    Follow-Up: At [facility], you and your health needs are our priority.  As part of our continuing mission to provide you with exceptional heart care, we have created designated Provider Care Teams.  These Care Teams include your primary Cardiologist (physician) and Advanced Practice Providers (APPs -  Physician Assistants and Nurse Practitioners) who all work together to provide you with the care you need, when you need it.  We recommend signing up for the patient portal called "MyChart".  Sign up information is provided on this After Visit Summary.  MyChart is used to connect with patients for Virtual Visits (Telemedicine).  Patients are able to view lab/test results, encounter notes, upcoming appointments, etc.  Non-urgent messages can be sent to your provider as well.   To learn more about what you can do with MyChart, go to [email or web address].    Your next appointment:   1 year(s)  The format for your next appointment:   In Person  Provider:   6 months with Richardson Dopp PA-C  1 year with Dr. Burt Knack.

## 2021-09-06 ENCOUNTER — Other Ambulatory Visit: Payer: Self-pay

## 2021-09-09 ENCOUNTER — Ambulatory Visit (INDEPENDENT_AMBULATORY_CARE_PROVIDER_SITE_OTHER): Payer: Medicare Other | Admitting: Family Medicine

## 2021-09-09 ENCOUNTER — Encounter: Payer: Self-pay | Admitting: Family Medicine

## 2021-09-09 ENCOUNTER — Other Ambulatory Visit: Payer: Self-pay

## 2021-09-09 VITALS — BP 138/82 | HR 91 | Temp 97.3°F | Ht 67.5 in | Wt 202.2 lb

## 2021-09-09 DIAGNOSIS — N1832 Chronic kidney disease, stage 3b: Secondary | ICD-10-CM | POA: Diagnosis not present

## 2021-09-09 DIAGNOSIS — E782 Mixed hyperlipidemia: Secondary | ICD-10-CM | POA: Diagnosis not present

## 2021-09-09 DIAGNOSIS — E1121 Type 2 diabetes mellitus with diabetic nephropathy: Secondary | ICD-10-CM | POA: Diagnosis not present

## 2021-09-09 DIAGNOSIS — I5022 Chronic systolic (congestive) heart failure: Secondary | ICD-10-CM | POA: Diagnosis not present

## 2021-09-09 DIAGNOSIS — E038 Other specified hypothyroidism: Secondary | ICD-10-CM | POA: Diagnosis not present

## 2021-09-09 DIAGNOSIS — I10 Essential (primary) hypertension: Secondary | ICD-10-CM | POA: Diagnosis not present

## 2021-09-09 DIAGNOSIS — F5101 Primary insomnia: Secondary | ICD-10-CM | POA: Diagnosis not present

## 2021-09-09 DIAGNOSIS — R209 Unspecified disturbances of skin sensation: Secondary | ICD-10-CM | POA: Diagnosis not present

## 2021-09-09 MED ORDER — GABAPENTIN 300 MG PO CAPS: 300.0000 mg | ORAL_CAPSULE | Freq: Every day | ORAL | 3 refills | Status: DC

## 2021-09-09 MED ORDER — LEVOTHYROXINE SODIUM 150 MCG PO TABS: 150.0000 ug | ORAL_TABLET | Freq: Every day | ORAL | 3 refills | Status: DC

## 2021-09-09 MED ORDER — DAPAGLIFLOZIN PROPANEDIOL 5 MG PO TABS: 5.0000 mg | ORAL_TABLET | Freq: Every day | ORAL | 2 refills | Status: DC

## 2021-09-09 NOTE — Progress Notes (Signed)
[facility] [address] [place] [address] Dept: [phone] Dept Fax: [phone]  Chronic Care Office Visit  Subjective:    Patient ID: Nicolas Ray, male    DOB: 1951-01-13, 71 y.o..   MRN: [id number]  Chief Complaint  Patient presents with   Follow-up    4 week f/u.     History of Present Illness:  Patient is in today for reassessment of chronic medical issues.  Mr. Guaman brought a list of questions, comments, and advise on my medical practice and bedside manner as follow-up from his initial visit with me.   Mr. Whipp continues to complain of occasional pin-like or poking sensations at the right lower leg. He associates this with when he eats honey and when he eats apples. He continues to have a worry that this might represent a circulation issue. He feels I have not addressed this issue, which has been his primary concern.  Mr. Cruey has a history of coronary artery disease. He suffered an STEMI in 2016. He refused CABG. he ended up having to DES placed in the mid LAD and proximal RCA. Additionally, he has chronic systolic heart failure. He recently saw Dr. Burt Knack (cardiology). He has been recommended to be on aspirin, Plavix, carvedilol and rosuvastatin.   At his last visit, I had prescribed Lasix for management of lower leg edema. He notes that within 3 hours of taking the first dose, he has swelling of the bottom of his feet . He did not take any further doses and the swelling improved.   Mr. Searcy has hypothyroidism and is managed on levothyroxine. He has concerns about changing the dose of his levothyroxine and wonders if this does anything for him.   Mr. Wegner states he doesn't understand how he could have diabetes since he does not eat sugar.   Mr. Rideaux notes he is being prescribed gabapentin at bedtime to help with his sleep. He finds this gives him 6-7 hours of uninterrupted sleep, which he finds  satisfactory.   Past Medical History: Patient Active Problem List   Diagnosis Date Noted   Chronic kidney disease, stage 3b ([place]) 08/08/2021   Hyperlipidemia, unspecified 08/08/2021   Hypertensive heart disease with heart failure ([place]) 08/08/2021   Kidney stone 08/08/2021   Lower urinary tract symptoms 08/08/2021   Smoker 08/08/2021   Stented coronary artery 08/08/2021   History of ST elevation myocardial infarction (STEMI), of lateral wall 08/08/2021   OSA (obstructive sleep apnea) 09/21/2018   BPH with urinary obstruction 04/14/2016   Bilateral hydronephrosis 01/08/2016   Coronary artery disease involving native coronary artery of native heart with unstable angina pectoris ([place])    Type 2 diabetes mellitus with diabetic nephropathy, without long-term current use of insulin ([place]) 93/81/8299   Chronic systolic heart failure ([place]) 04/19/2015   Other male erectile dysfunction 04/18/2014   Sciatica 03/15/2013   Hypothyroidism 02/08/2013   Essential hypertension 02/08/2013   Depression 01/25/2013   Low back pain 01/25/2013   Past Surgical History:  Procedure Laterality Date   CARDIAC CATHETERIZATION N/A 04/18/2015   Procedure: Left Heart Cath and Coronary Angiography;  Surgeon: Sherren Mocha, MD;  Location: [facility];  Service: Cardiovascular;  Laterality: N/A;   CARDIAC CATHETERIZATION N/A 04/18/2015   Procedure: Coronary Stent Intervention;  Surgeon: Sherren Mocha, MD;  Location: [facility] [facility];  Service: Cardiovascular;  Laterality: N/A;   CARDIAC CATHETERIZATION N/A 04/20/2015   Procedure: Coronary Stent Intervention;  Surgeon:  Burnell Blanks, MD;  Location: [facility];  Service: Cardiovascular;  Laterality: N/A;   CORONARY STENT PLACEMENT     Family History  Problem Relation Age of Onset   Heart disease Mother    Cancer Father        Neck   Thyroid disease Sister    Stroke Brother    Colon cancer Neg Hx    Diabetes Neg Hx    Outpatient  Medications Prior to Visit  Medication Sig Dispense Refill   aspirin EC 81 MG tablet Take 1 tablet by mouth daily. Maybe once a month     carvedilol (COREG) 12.5 MG tablet TAKE 1 TABLET(12.5 MG) BY MOUTH TWICE DAILY 180 tablet 1   Cholecalciferol (VITAMIN D3) 125 MCG (5000 UT) CAPS Take 5,000 Units by mouth See admin instructions. 2-3 times a week     clopidogrel (PLAVIX) 75 MG tablet TAKE 1 TABLET(75 MG) BY MOUTH DAILY 90 tablet 1   co-enzyme Q-10 30 MG capsule Take 30 mg by mouth daily.      furosemide (LASIX) 20 MG tablet Take 1 tablet (20 mg total) by mouth daily. 30 tablet 3   rosuvastatin (CRESTOR) 10 MG tablet TAKE 1 TABLET BY MOUTH DAILY (Patient taking differently: Take 10 mg by mouth daily.) 90 tablet 1   gabapentin (NEURONTIN) 300 MG capsule Take 300 mg by mouth at bedtime.     levothyroxine (SYNTHROID) 125 MCG tablet Take 125 mcg by mouth daily before breakfast.     nitroGLYCERIN (NITROSTAT) 0.4 MG SL tablet Place 1 tablet (0.4 mg total) under the tongue every 5 (five) minutes as needed for chest pain. 25 tablet 5   No facility-administered medications prior to visit.   No Known Allergies    Objective:   Today's Vitals   09/09/21 1011  BP: 138/82  Pulse: 91  Temp: (!) 97.3 F (36.3 C)  TempSrc: Temporal  SpO2: 97%  Weight: 202 lb 3.2 oz (91.7 kg)  Height: 5' 7.5" (1.715 m)   Body mass index is 31.2 kg/m.   General: Well developed, well nourished. No acute distress. Extremities: 1-2+ edema noted. Skin: Warm and dry. No rashes. Neuro:CN II-XII intact. Normal sensation and DTR bilaterally. Psych: Alert and oriented x3. Normal mood and affect.  Health Maintenance Due  Topic Date Due   COVID-19 Vaccine (1) Never done   OPHTHALMOLOGY EXAM  Never done   Hepatitis C Screening  Never done   TETANUS/TDAP  Never done   Zoster Vaccines- Shingrix (1 of 2) Never done   Pneumonia Vaccine 56+ Years old (2 - PCV) 04/20/2016   FOOT EXAM  08/07/2017   COLONOSCOPY (Pts  45-59yrs Insurance coverage will need to be confirmed)  10/24/2018   INFLUENZA VACCINE  04/01/2021   Lab Results Last metabolic panel Lab Results  Component Value Date   GLUCOSE 145 (H) 08/08/2021   NA 133 (L) 08/08/2021   K 4.5 08/08/2021   CL 100 08/08/2021   CO2 27 08/08/2021   BUN 41 (H) 08/08/2021   CREATININE 1.90 (H) 08/08/2021   GFRNONAA 41 (L) 12/04/2020   CALCIUM 9.3 08/08/2021   PROT 7.4 08/08/2021   ALBUMIN 3.8 08/08/2021   BILITOT 1.3 (H) 08/08/2021   ALKPHOS 70 08/08/2021   AST 15 08/08/2021   ALT 12 08/08/2021   ANIONGAP 5 12/04/2020   Last lipids Lab Results  Component Value Date   CHOL 125 08/08/2021   HDL 38.40 (L) 08/08/2021   LDLCALC 61 08/08/2021  TRIG 125.0 08/08/2021   CHOLHDL 3 08/08/2021   Last hemoglobin A1c Lab Results  Component Value Date   HGBA1C 8.6 (H) 08/08/2021   Last thyroid functions Lab Results  Component Value Date   TSH 16.35 (H) 08/08/2021   Component Ref Range & Units 1 mo ago 3 yr ago 7 yr ago  Microalb, Ur 0.0 - 1.9 mg/dL 234.5 High   69.9 High   30.99 High  R, CM   Creatinine,U mg/dL 78.8  109.6    Microalb Creat Ratio 0.0 - 30.0 mg/g 297.5 High   63.8 High   335.8 High        Assessment & Plan:   1. Type 2 diabetes mellitus with diabetic nephropathy, without long-term current use of insulin [facility]) Mr. Mogel diabetes is uncontrolled. This likely contributes to some of the lower leg sensory changes he has experienced. He is willing to consider trying a medication. I recommended he  start on an SGLT2 inhibitor, in light of his underlying heart disease. I will plan to see him back in 2 months and repeat his A1c.  - dapagliflozin propanediol (FARXIGA) 5 MG TABS tablet; Take 1 tablet (5 mg total) by mouth daily before breakfast.  Dispense: 30 tablet; Refill: 2  2. Other specified hypothyroidism Mr. Kichline TSH is not at target. He maintains that he had been taking his levothyroxine consistently. I explained that this  also could have an impact on neuropathy. He is willing to increase his levothyroxine dose.  - levothyroxine (SYNTHROID) 150 MCG tablet; Take 1 tablet (150 mcg total) by mouth daily before breakfast.  Dispense: 90 tablet; Refill: 3  3. Essential hypertension Mr. Ehle blood pressure is at goal. I will continue his carvedilol.  4. Chronic systolic heart failure [facility]) Mr. Syme recently saw Dr. Burt Knack. I feel starting [place] may address both his CHF and his diabetes issue. We will try to keep him engaged in this approach to his care.  - dapagliflozin propanediol (FARXIGA) 5 MG TABS tablet; Take 1 tablet (5 mg total) by mouth daily before breakfast.  Dispense: 30 tablet; Refill: 2  5. Mixed hyperlipidemia Lipids at goal on Crestor.  6. Chronic kidney disease, stage 3b [facility]) Mr. Limas has stage 3a chronic kidney disease with proteinuria, likely due to diabetic nephropathy. Once I establish some rapport with him, I will try to refer him ot nephrology.  7. Abnormal sensation of leg I suspect the underlying issue to be neuropathy. He takes gabapentin for sleep at bedtime. I will try and improve his thyroid and diabetes control and then reassess.  8. Primary insomnia We will continue gabapentin.  - gabapentin (NEURONTIN) 300 MG capsule; Take 1 capsule (300 mg total) by mouth at bedtime.  Dispense: 90 capsule; Refill: 3  Haydee Salter, MD

## 2021-10-22 ENCOUNTER — Telehealth: Payer: Self-pay

## 2021-10-22 ENCOUNTER — Other Ambulatory Visit: Payer: Self-pay | Admitting: Family Medicine

## 2021-10-22 DIAGNOSIS — R600 Localized edema: Secondary | ICD-10-CM

## 2021-10-22 NOTE — Telephone Encounter (Signed)
Noted. Dm/cma  

## 2021-10-22 NOTE — Telephone Encounter (Signed)
Spoke to patient and he states that the [place] Glade is causing his feet to feel numb and has stopped taking it.  He has an upcoming appointment in April.  Any suggestions for him? Please review and advise.  Thanks.  Dm/cma

## 2021-10-22 NOTE — Telephone Encounter (Signed)
Lft VM to RTN call. Dm/cma  

## 2021-11-21 ENCOUNTER — Other Ambulatory Visit: Payer: Self-pay | Admitting: Cardiovascular Disease

## 2021-12-02 ENCOUNTER — Encounter: Payer: Self-pay | Admitting: Family Medicine

## 2021-12-02 ENCOUNTER — Ambulatory Visit (INDEPENDENT_AMBULATORY_CARE_PROVIDER_SITE_OTHER): Payer: Medicare Other | Admitting: Family Medicine

## 2021-12-02 VITALS — BP 130/78 | HR 76 | Temp 97.7°F | Ht 67.75 in | Wt 200.6 lb

## 2021-12-02 DIAGNOSIS — E1140 Type 2 diabetes mellitus with diabetic neuropathy, unspecified: Secondary | ICD-10-CM | POA: Diagnosis not present

## 2021-12-02 DIAGNOSIS — E1121 Type 2 diabetes mellitus with diabetic nephropathy: Secondary | ICD-10-CM | POA: Diagnosis not present

## 2021-12-02 DIAGNOSIS — I10 Essential (primary) hypertension: Secondary | ICD-10-CM

## 2021-12-02 DIAGNOSIS — G629 Polyneuropathy, unspecified: Secondary | ICD-10-CM | POA: Insufficient documentation

## 2021-12-02 DIAGNOSIS — E038 Other specified hypothyroidism: Secondary | ICD-10-CM | POA: Diagnosis not present

## 2021-12-02 DIAGNOSIS — I5022 Chronic systolic (congestive) heart failure: Secondary | ICD-10-CM

## 2021-12-02 DIAGNOSIS — E1165 Type 2 diabetes mellitus with hyperglycemia: Secondary | ICD-10-CM

## 2021-12-02 LAB — TSH: TSH: 1.82 u[IU]/mL (ref 0.35–5.50)

## 2021-12-02 LAB — HEMOGLOBIN A1C: Hgb A1c MFr Bld: 8.6 % — ABNORMAL HIGH (ref 4.6–6.5)

## 2021-12-02 LAB — GLUCOSE, RANDOM: Glucose, Bld: 129 mg/dL — ABNORMAL HIGH (ref 70–99)

## 2021-12-02 NOTE — Progress Notes (Signed)
?[facility] ?[place] ?[place] [address] ?Dept: [phone] ?Dept Fax: [phone] ? ?Chronic Care Office Visit ? ?Subjective:  ? ? Patient ID: JERRIN RECORE, male    DOB: 11-Sep-1950, 71 y.o..   MRN: [id number] ? ?Chief Complaint  ?Patient presents with  ? Follow-up  ?  2 month f/u. No concerns.   ? ? ?History of Present Illness: ? ?Patient is in today for reassessment of chronic medical issues. ? ?Mr. Bousquet has a history of Type 2 diabetes. He continues to express disbelief that this is causing any of his issues. He did start taking dapagliflozin at his last visit. He states this caused him some numbness in his feet, but he has continued this. Since that time, he feels his issue with lower leg pain has improved. He wonders if this was the new medication or if it was due to stopping one of his supplements. ? ?Mr. Bienvenue continues to complain of occasional pin-like or poking sensations at the right lower leg. He associates this with when he eats sweets. He continues to feel I have not addressed this issue, despite having discovered her diabetes and hypothyroidism to be out of control, both of which could have contributed to this. ?  ?Mr. Matte has a history of coronary artery disease. He suffered an STEMI in 2016. He refused CABG. He ended up having to DES placed in the mid LAD and proximal RCA. Additionally, he has chronic systolic heart failure. Dr. Burt Knack (cardiology) recommended aspirin, Plavix, carvedilol and rosuvastatin. He had also been prescribed Lasix for lower leg edema, which he believes worsened swelling to his lower legs. ?   ?Mr. Coale's has hypothyroidism and is managed on levothyroxine. His last TSH was elevated, so we did make a change in his levothyroxine dose. ?  ?Mr. Cieslinski notes he takes gabapentin at bedtime to help with his sleep. He finds this gives him 6-7 hours of uninterrupted sleep, which he finds satisfactory. He doesn't  understand how this might help his leg pain. ? ?Mr. Maynes mentions concerns about his billing with the office. ? ?Mr. Madrazo notes that he continues to get some itching when he eats nuts. He finds that taking Benadryl helps. ? ?Past Medical History: ?Patient Active Problem List  ? Diagnosis Date Noted  ? Peripheral polyneuropathy 12/02/2021  ? Chronic kidney disease, stage 3b ([place]) 08/08/2021  ? Hyperlipidemia, unspecified 08/08/2021  ? Hypertensive heart disease with heart failure ([place]) 08/08/2021  ? Kidney stone 08/08/2021  ? Lower urinary tract symptoms 08/08/2021  ? Smoker 08/08/2021  ? Stented coronary artery 08/08/2021  ? History of ST elevation myocardial infarction (STEMI), of lateral wall 08/08/2021  ? OSA (obstructive sleep apnea) 09/21/2018  ? BPH with urinary obstruction 04/14/2016  ? Bilateral hydronephrosis 01/08/2016  ? Coronary artery disease involving native coronary artery of native heart with unstable angina pectoris ([place])   ? Poorly controlled type 2 diabetes mellitus with neuropathy ([place]) 04/19/2015  ? Chronic systolic heart failure ([place]) 04/19/2015  ? Other male erectile dysfunction 04/18/2014  ? Sciatica 03/15/2013  ? Hypothyroidism 02/08/2013  ? Essential hypertension 02/08/2013  ? Depression 01/25/2013  ? Low back pain 01/25/2013  ? ?Past Surgical History:  ?Procedure Laterality Date  ? CARDIAC CATHETERIZATION N/A 04/18/2015  ? Procedure: Left Heart Cath and Coronary Angiography;  Surgeon: Sherren Mocha, MD;  Location: [place] [facility];  Service: Cardiovascular;  Laterality: N/A;  ? CARDIAC CATHETERIZATION N/A 04/18/2015  ? Procedure: Coronary  Stent Intervention;  Surgeon: Sherren Mocha, MD;  Location: [place] [facility];  Service: Cardiovascular;  Laterality: N/A;  ? CARDIAC CATHETERIZATION N/A 04/20/2015  ? Procedure: Coronary Stent Intervention;  Surgeon: Burnell Blanks, MD;  Location: [place];  Service: Cardiovascular;  Laterality: N/A;  ? CORONARY STENT PLACEMENT     ? ?Family History  ?Problem Relation Age of Onset  ? Heart disease Mother   ? Cancer Father   ?     Neck  ? Thyroid disease Sister   ? Stroke Brother   ? Colon cancer Neg Hx   ? Diabetes Neg Hx   ? ?Outpatient Medications Prior to Visit  ?Medication Sig Dispense Refill  ? aspirin EC 81 MG tablet Take 1 tablet by mouth daily. Maybe once a month    ? carvedilol (COREG) 12.5 MG tablet TAKE 1 TABLET(12.5 MG) BY MOUTH TWICE DAILY 180 tablet 1  ? Cholecalciferol (VITAMIN D3) 125 MCG (5000 UT) CAPS Take 5,000 Units by mouth See admin instructions. 2-3 times a week    ? clopidogrel (PLAVIX) 75 MG tablet TAKE 1 TABLET(75 MG) BY MOUTH DAILY 90 tablet 1  ? co-enzyme Q-10 30 MG capsule Take 30 mg by mouth daily.     ? dapagliflozin propanediol (FARXIGA) 5 MG TABS tablet Take 1 tablet (5 mg total) by mouth daily before breakfast. 30 tablet 2  ? gabapentin (NEURONTIN) 300 MG capsule Take 1 capsule (300 mg total) by mouth at bedtime. 90 capsule 3  ? levothyroxine (SYNTHROID) 150 MCG tablet Take 1 tablet (150 mcg total) by mouth daily before breakfast. 90 tablet 3  ? nitroGLYCERIN (NITROSTAT) 0.4 MG SL tablet Place 1 tablet (0.4 mg total) under the tongue every 5 (five) minutes as needed for chest pain. 25 tablet 5  ? rosuvastatin (CRESTOR) 10 MG tablet TAKE 1 TABLET BY MOUTH DAILY 90 tablet 2  ? ?No facility-administered medications prior to visit.  ? ?No Known Allergies ?   ?Objective:  ? ?Today's Vitals  ? 12/02/21 1048  ?BP: 130/78  ?Pulse: 76  ?Temp: 97.7 ?F (36.5 ?C)  ?TempSrc: Temporal  ?SpO2: 97%  ?Weight: 200 lb 9.6 oz (91 kg)  ?Height: 5' 7.75" (1.721 m)  ? ?Body mass index is 30.73 kg/m?.  ? ?General: Well developed, well nourished. No acute distress. ?Psych: Alert and oriented. Normal mood and affect. ? ?Health Maintenance Due  ?Topic Date Due  ? OPHTHALMOLOGY EXAM  Never done  ? Hepatitis C Screening  Never done  ? TETANUS/TDAP  Never done  ? Zoster Vaccines- Shingrix (1 of 2) Never done  ? Pneumonia Vaccine 89+ Years  old (2 - PCV) 04/20/2016  ? FOOT EXAM  08/07/2017  ? COLONOSCOPY (Pts 45-38yr Insurance coverage will need to be confirmed)  10/24/2018  ?   ?Assessment & Plan:  ? ?1. Poorly controlled type 2 diabetes mellitus with neuropathy ([place] ?We are due to repeat an A1c today. I am hopeful that the dapagliflozin may have improved this some. We chose this as a medicine that would benefit both his chronic heart failure and his diabetes. He has some unmet screening needs, which I will attempt to address incrementally, as he has some suspicions about having such done. ? ?- Glucose, random ?- Hemoglobin A1c ? ?2. Essential hypertension ?Mr. Payano's blood pressure is adequately controlled on carvedilol. ? ?3. Other specified hypothyroidism ?Due to reassess Mr. NBaraTSH today. He will continue levothyroxine 150 mg daily. ? ?- TSH ? ?4. Peripheral  polyneuropathy ?Mr. Ingman continues to have concern for the neuropathy symptoms he is experiencing. He seems unconvinced that I have evaluated this or taken action to try and help improve this. I have restarted him on a diabetes medication and have adjusted his levothyroxine to try and control these conditions. I will refer him to neurology for further assessment. ? ?- Ambulatory referral to Neurology ? ?I recommended he speak with our office manager concerning his billing questions. I recommended he avoid tree nuts. ? ?Return in about 3 months (around 03/03/2022) for Reassessment.  ? ?Haydee Salter, MD ?

## 2021-12-03 MED ORDER — DAPAGLIFLOZIN PROPANEDIOL 10 MG PO TABS: 10.0000 mg | ORAL_TABLET | Freq: Every day | ORAL | 3 refills | Status: DC

## 2021-12-03 NOTE — Addendum Note (Signed)
Addended by: Haydee Salter on: 12/03/2021 01:20 PM ? ? Modules accepted: Orders ? ?

## 2021-12-09 DIAGNOSIS — H2513 Age-related nuclear cataract, bilateral: Secondary | ICD-10-CM | POA: Diagnosis not present

## 2021-12-09 DIAGNOSIS — E113493 Type 2 diabetes mellitus with severe nonproliferative diabetic retinopathy without macular edema, bilateral: Secondary | ICD-10-CM | POA: Diagnosis not present

## 2022-01-01 ENCOUNTER — Telehealth: Payer: Self-pay | Admitting: Family Medicine

## 2022-01-01 NOTE — Telephone Encounter (Signed)
Spoke to patient, he got 3 bottles of #30 count. He combined the bottles.  No other questions. Dm/cma ? ?

## 2022-01-01 NOTE — Telephone Encounter (Signed)
Pt has  questions about dapagliflozin propanediol (FARXIGA) 10 MG TABS tablet [[id number]] , he got 3 bottles of 90 pills. He is wondering if he should still take one pill a day. Please advise pt  at [phone] ?

## 2022-01-14 ENCOUNTER — Telehealth: Payer: Self-pay | Admitting: Family Medicine

## 2022-01-14 NOTE — Telephone Encounter (Signed)
Pt called wanting to speak with Nicolas Ray about some things. Would not give me anymore information.  ?

## 2022-01-14 NOTE — Telephone Encounter (Signed)
Spoke to patient and he states that he started taking the [place] about 5-6 days ago and he now has a rash/itching on both arms. No other changes in soaps or diet. He thinks it could be the medication. Please review and advise  ?Thanks.  Dm/cma ? ?

## 2022-01-15 ENCOUNTER — Encounter: Payer: Self-pay | Admitting: Family Medicine

## 2022-01-15 ENCOUNTER — Ambulatory Visit (INDEPENDENT_AMBULATORY_CARE_PROVIDER_SITE_OTHER): Payer: Medicare Other | Admitting: Family Medicine

## 2022-01-15 VITALS — BP 132/78 | HR 89 | Temp 97.4°F | Ht 67.75 in | Wt 201.0 lb

## 2022-01-15 DIAGNOSIS — E038 Other specified hypothyroidism: Secondary | ICD-10-CM

## 2022-01-15 DIAGNOSIS — G629 Polyneuropathy, unspecified: Secondary | ICD-10-CM

## 2022-01-15 DIAGNOSIS — E1140 Type 2 diabetes mellitus with diabetic neuropathy, unspecified: Secondary | ICD-10-CM

## 2022-01-15 DIAGNOSIS — L309 Dermatitis, unspecified: Secondary | ICD-10-CM | POA: Diagnosis not present

## 2022-01-15 DIAGNOSIS — E1165 Type 2 diabetes mellitus with hyperglycemia: Secondary | ICD-10-CM

## 2022-01-15 MED ORDER — GLIPIZIDE 5 MG PO TABS: 5.0000 mg | ORAL_TABLET | Freq: Two times a day (BID) | ORAL | 3 refills | Status: DC

## 2022-01-15 NOTE — Telephone Encounter (Signed)
Called patient and advised of recommendations.  He is scheduled for an appointment today @ 11:00 am. Dm/cma ? ?

## 2022-01-15 NOTE — Progress Notes (Signed)
?[facility] ?[address] ?[place] [address] ?Dept: [phone] ?Dept Fax: [phone] ? ?Office Visit ? ?Subjective:  ? ? Patient ID: Nicolas Ray, male    DOB: [birth year], 71 y.o..   MRN: [id number] ? ?Chief Complaint  ?Patient presents with  ? Follow-up  ?  Discuss rash on arms and [place].   ? ? ?History of Present Illness: ? ?Patient is in today for assessment of a rash he has developed in the last week on his arms and legs. The rash is pruritic. [name] is a difficult historian. It appears that he associates the rash with when he made a dosage increase in his dapagliflozin [place]). At his last visit in early April, his A1c remained high, so I advised him to increase his dose. He apparently wanted to continue the 5 mg dose until gone, so only recently had started taking the 10 mg dose. He denies any other recent changes in things such as washing soaps. ? ?[name] has a history of Type 2 diabetes. He does not like being labeled as having diabetes. he notes that he does not eat excess sugars, so feels it is wrong to say he has diabetes when he feels he is making good choices. ? ?[name] had been complaining of occasional pin-like or poking sensations at the right lower leg. He also has described a sensation as of pressure or twisting. He notes that the pin-like pain has improved. He is unsure of what to attribute this to. I had previously referred him to neurology, but his appointment is not until Sept. ? ?Past Medical History: ?Patient Active Problem List  ? Diagnosis Date Noted  ? Peripheral polyneuropathy 12/02/2021  ? Chronic kidney disease, stage 3b ([place]) 08/08/2021  ? Hyperlipidemia, unspecified 08/08/2021  ? Hypertensive heart disease with heart failure ([place]) 08/08/2021  ? Kidney stone 08/08/2021  ? Lower urinary tract symptoms 08/08/2021  ? Smoker 08/08/2021  ? Stented coronary artery 08/08/2021  ? History of ST elevation myocardial  infarction (STEMI), of lateral wall 08/08/2021  ? OSA (obstructive sleep apnea) 09/21/2018  ? BPH with urinary obstruction 04/14/2016  ? Bilateral hydronephrosis 01/08/2016  ? Coronary artery disease involving native coronary artery of native heart with unstable angina pectoris ([place])   ? Poorly controlled type 2 diabetes mellitus with neuropathy ([place]) 04/19/2015  ? Chronic systolic heart failure ([place]) 04/19/2015  ? Other male erectile dysfunction 04/18/2014  ? Sciatica 03/15/2013  ? Hypothyroidism 02/08/2013  ? Essential hypertension 02/08/2013  ? Depression 01/25/2013  ? Low back pain 01/25/2013  ? ?Past Surgical History:  ?Procedure Laterality Date  ? CARDIAC CATHETERIZATION N/A 04/18/2015  ? Procedure: Left Heart Cath and Coronary Angiography;  Surgeon: Sherren Mocha, MD;  Location: [facility];  Service: Cardiovascular;  Laterality: N/A;  ? CARDIAC CATHETERIZATION N/A 04/18/2015  ? Procedure: Coronary Stent Intervention;  Surgeon: Sherren Mocha, MD;  Location: [facility];  Service: Cardiovascular;  Laterality: N/A;  ? CARDIAC CATHETERIZATION N/A 04/20/2015  ? Procedure: Coronary Stent Intervention;  Surgeon: Burnell Blanks, MD;  Location: [facility];  Service: Cardiovascular;  Laterality: N/A;  ? CORONARY STENT PLACEMENT    ? ?Family History  ?Problem Relation Age of Onset  ? Heart disease Mother   ? Cancer Father   ?     Neck  ? Thyroid disease Sister   ? Stroke Brother   ? Colon cancer Neg Hx   ? Diabetes Neg  Hx   ? ?Outpatient Medications Prior to Visit  ?Medication Sig Dispense Refill  ? aspirin EC 81 MG tablet Take 1 tablet by mouth daily. Maybe once a month    ? carvedilol (COREG) 12.5 MG tablet TAKE 1 TABLET(12.5 MG) BY MOUTH TWICE DAILY 180 tablet 1  ? Cholecalciferol (VITAMIN D3) 125 MCG (5000 UT) CAPS Take 5,000 Units by mouth See admin instructions. 2-3 times a week    ? clopidogrel (PLAVIX) 75 MG tablet TAKE 1 TABLET(75 MG) BY MOUTH DAILY 90 tablet 1  ? co-enzyme Q-10  30 MG capsule Take 30 mg by mouth daily.     ? gabapentin (NEURONTIN) 300 MG capsule Take 1 capsule (300 mg total) by mouth at bedtime. 90 capsule 3  ? levothyroxine (SYNTHROID) 150 MCG tablet Take 1 tablet (150 mcg total) by mouth daily before breakfast. 90 tablet 3  ? rosuvastatin (CRESTOR) 10 MG tablet TAKE 1 TABLET BY MOUTH DAILY 90 tablet 2  ? dapagliflozin propanediol (FARXIGA) 10 MG TABS tablet Take 1 tablet (10 mg total) by mouth daily before breakfast. 90 tablet 3  ? nitroGLYCERIN (NITROSTAT) 0.4 MG SL tablet Place 1 tablet (0.4 mg total) under the tongue every 5 (five) minutes as needed for chest pain. 25 tablet 5  ? ?No facility-administered medications prior to visit.  ? ?No Known Allergies ?   ?Objective:  ? ?Today's Vitals  ? 01/15/22 1017  ?BP: 132/78  ?Pulse: 89  ?Temp: (!) 97.4 ?F (36.3 ?C)  ?TempSrc: Temporal  ?SpO2: 96%  ?Weight: 201 lb (91.2 kg)  ?Height: 5' 7.75" (1.721 m)  ? ?Body mass index is 30.79 kg/m?.  ? ?General: Well developed, well nourished. No acute distress. ?Extremities: Trace-1+ edema of lower legs. ?Skin: Warm and dry. Diffuse rash on both arms and legs. There are small erythematous  ? macules with excoriation present. ?Psych: Alert and oriented. Normal mood and affect. ? ?Health Maintenance Due  ?Topic Date Due  ? OPHTHALMOLOGY EXAM  Never done  ? Hepatitis C Screening  Never done  ? TETANUS/TDAP  Never done  ? Zoster Vaccines- Shingrix (1 of 2) Never done  ? Pneumonia Vaccine 34+ Years old (2 - PCV) 04/20/2016  ? FOOT EXAM  08/07/2017  ? COLONOSCOPY (Pts 45-52yr Insurance coverage will need to be confirmed)  10/24/2018  ?   ?Lab Results: ?Lab Results  ?Component Value Date  ? HGBA1C 8.6 (H) 12/02/2021  ? ?Lab Results  ?Component Value Date  ? TSH 1.82 12/02/2021  ? ?Assessment & Plan:  ? ?1. Dermatitis ?The dermatitis is non-specific. As near as I can determine its development did coincide with when Mr. NWatchmanincreased his FWilder Gladedose. As skin rash is a reported finding with  the use of Farxiga, I have advised him to stop taking this medication. I offered to provide him an antihistamine for itching, but he declined. ? ?2. Poorly controlled type 2 diabetes mellitus with neuropathy ([place] ?I had a discussion with Mr. NOsmundsonabout diabetes. I discussed the pathophysiology of the disease, its relationship to insulin resistance, and the approaches to management with appropriate diet, routine exercise, and medication. I discussed the presence of carbohydrates other than simple sugars in the diet. I had started an SGLT2-I in the hopes it would improve his blood sugars and benefit Mr. NMatternHFrEF. In discussing other potential medications to treat diabetes, Mr. NHeningerwas adamant that metformin was potentially harmful and that no patient should ever take this. He notes that within a day  of taking this he had marked swelling of his feet. I will try him on glipizide instead and recheck on him in 3 months. ? ?- glipiZIDE (GLUCOTROL) 5 MG tablet; Take 1 tablet (5 mg total) by mouth 2 (two) times daily before a meal.  Dispense: 60 tablet; Refill: 3 ? ?3. Peripheral polyneuropathy ?I discussed with Mr. Kramp about the relationship between high blood sugars and damage to peripheral nerves. He notes the intermittent nature of his numbness. He was not aware that the gabapentin was intended to help improve his neuropathic pain. He thought this was just helping him get 1-1.5 hours more sleep each night. I did emphasize that improved blood sugars would help to resolve, mitigate, or delay progression of any further nerve damage. I do still want him to follow through with neurology to confirm his neuropathy. He will continue gabapentin 300 mg qhs. ? ?4. Other specified hypothyroidism ?Mr. Gowan's TSH is now in the normal range. He takes his levothyroxine 150 mcg 30 minutes before breakfast. I urged him to continue this approach. ? ?Return in about 3 months (around 04/17/2022) for Reassessment.  ? ?Haydee Salter, MD ?

## 2022-01-24 ENCOUNTER — Other Ambulatory Visit: Payer: Self-pay | Admitting: Cardiovascular Disease

## 2022-01-28 ENCOUNTER — Ambulatory Visit: Payer: Medicare Other | Admitting: Physician Assistant

## 2022-05-19 ENCOUNTER — Encounter: Payer: Self-pay | Admitting: Neurology

## 2022-05-19 ENCOUNTER — Ambulatory Visit (INDEPENDENT_AMBULATORY_CARE_PROVIDER_SITE_OTHER): Payer: Medicare Other | Admitting: Neurology

## 2022-05-19 VITALS — BP 174/108 | HR 88 | Ht 67.75 in | Wt 201.0 lb

## 2022-05-19 DIAGNOSIS — L819 Disorder of pigmentation, unspecified: Secondary | ICD-10-CM | POA: Diagnosis not present

## 2022-05-19 DIAGNOSIS — E1165 Type 2 diabetes mellitus with hyperglycemia: Secondary | ICD-10-CM

## 2022-05-19 DIAGNOSIS — E1140 Type 2 diabetes mellitus with diabetic neuropathy, unspecified: Secondary | ICD-10-CM

## 2022-05-19 NOTE — Progress Notes (Signed)
[facility] Clinic Note - Initial Visit   Date: 05/19/2022   Nicolas Ray MRN: [id number] DOB: February 18, 1951   Dear Dr. Gena Fray:  Thank you for your kind referral of Nicolas Ray for consultation of abnormal skin sensation. Although his history is well known to you, please allow [name] to reiterate it for the purpose of our medical record. The patient was accompanied to the clinic by self.   Nicolas Ray is a 71 y.o. right-handed male with diabetes mellitus, CAD, MI, hypothyroidism, depression, hypertension, OSA, and cardiomyopathy presenting for evaluation of bilateral feet tingling.   IMPRESSION/PLAN: Sporadic paresthesias of the feet may indicate early diabetic neuropathy involving the feet.  Fortunately, symptoms are intermittent and not bothersome.  If symptoms progress and become constant, NCS/EMG can be performed.  Counseled patient on the importance of keeping blood sugars under control to minimize progression of neuropathy.   Discoloration of the toes is patient's primary concern and was under the impression this was the reason for his visit.  I explained that skin changes are not my area of expertise and recommend that he discuss with PCP about seeing dermatology/podiatry.    ------------------------------------------------------------- History of present illness: Starting around March 2023, he began having numbness over the first two toes and dorsum of of the medial foot.  It occurs every other day and lasts about a hour. Certain foods triggers. He denies associated pain.  He takes gabapentin '300mg'$  at bedtime, which he states he takes for sleep, not tingling/needle-like pain. No problems with balance or falls.    He is more concerned about the discoloration of his skin overlying the toes and says that he is here today for this problem.  He states that the skin overlying the toes turns brown and may be like this for several days and then goes away again.  He also  complaints of swelling in the left ankle and lower leg, but this has improved lately.   Out-side paper records, electronic medical record, and images have been reviewed where available and summarized as:  Lab Results  Component Value Date   HGBA1C 8.6 (H) 12/02/2021   No results found for: "VITAMINB12" Lab Results  Component Value Date   TSH 1.82 12/02/2021   No results found for: "ESRSEDRATE", "POCTSEDRATE"  Past Medical History:  Diagnosis Date   Acute MI, lateral wall, initial episode of care ([facility]) 04/18/2015   CAD (coronary artery disease) 04/18/2015   a. Lat STEMI 8/16 - LHC:  pLAD 90, mLAD 50, D1 100, dLCx 22, OM1 80, pRCA 75, EF 35-45% >> DES to D1;  b. staged PCI 04/20/15 - DES to mid LAD and DES to prox RCA   Depression    DM2 (diabetes mellitus, type 2) ([facility] [place])    HTN (hypertension) 02/08/2013   Renal Artery [name] 9/16: Bilateral RAS 1-59%, SMA >70%   Hypothyroidism    Ischemic cardiomyopathy    a. Echo 8/16:  Mild LVH, EF 40-45%, mid-apical anterolateral and apical AK, grade 1 diastolic dysfunction, trivial effusion   OSA (obstructive sleep apnea) 09/21/2018   ST elevation myocardial infarction (STEMI) of lateral wall ([place]) 04/18/2015    Past Surgical History:  Procedure Laterality Date   CARDIAC CATHETERIZATION N/A 04/18/2015   Procedure: Left Heart Cath and Coronary Angiography;  Surgeon: Sherren Mocha, MD;  Location: [facility];  Service: Cardiovascular;  Laterality: N/A;   CARDIAC CATHETERIZATION N/A 04/18/2015   Procedure: Coronary Stent Intervention;  Surgeon: Sherren Mocha, MD;  Location: [facility];  Service: Cardiovascular;  Laterality: N/A;   CARDIAC CATHETERIZATION N/A 04/20/2015   Procedure: Coronary Stent Intervention;  Surgeon: Burnell Blanks, MD;  Location: [facility];  Service: Cardiovascular;  Laterality: N/A;   CORONARY STENT PLACEMENT       Medications:  Outpatient Encounter Medications as of 05/19/2022  Medication Sig    aspirin EC 81 MG tablet Take 1 tablet by mouth daily. Maybe once a month   carvedilol (COREG) 12.5 MG tablet TAKE 1 TABLET(12.5 MG) BY MOUTH TWICE DAILY   Cholecalciferol (VITAMIN D3) 125 MCG (5000 UT) CAPS Take 5,000 Units by mouth See admin instructions. 2-3 times a week   clopidogrel (PLAVIX) 75 MG tablet TAKE 1 TABLET(75 MG) BY MOUTH DAILY   co-enzyme Q-10 30 MG capsule Take 30 mg by mouth daily.    levothyroxine (SYNTHROID) 150 MCG tablet Take 1 tablet (150 mcg total) by mouth daily before breakfast.   rosuvastatin (CRESTOR) 10 MG tablet TAKE 1 TABLET BY MOUTH DAILY   gabapentin (NEURONTIN) 300 MG capsule Take 1 capsule (300 mg total) by mouth at bedtime. (Patient not taking: Reported on 05/19/2022)   glipiZIDE (GLUCOTROL) 5 MG tablet Take 1 tablet (5 mg total) by mouth 2 (two) times daily before a meal. (Patient not taking: Reported on 05/19/2022)   nitroGLYCERIN (NITROSTAT) 0.4 MG SL tablet Place 1 tablet (0.4 mg total) under the tongue every 5 (five) minutes as needed for chest pain. (Patient not taking: Reported on 05/19/2022)   No facility-administered encounter medications on file as of 05/19/2022.    Allergies: No Known Allergies  Family History: Family History  Problem Relation Age of Onset   Heart disease Mother    Cancer Father        Neck   Thyroid disease Sister    Stroke Brother    Colon cancer Neg Hx    Diabetes Neg Hx     Social History: Social History   Tobacco Use   Smoking status: Light Smoker    Packs/day: 0.25    Types: Cigarettes   Smokeless tobacco: Never   Tobacco comments:    1-3 cigarettes per day 12.4.19  Vaping Use   Vaping Use: Never used  Substance Use Topics   Alcohol use: Yes   Drug use: No   Social History   Social History Narrative   Right Handed    Lives in a ranch style home with a basement.     Vital Signs:  BP (!) 174/108   Pulse 88   Ht 5' 7.75" (1.721 m)   Wt 201 lb (91.2 kg)   SpO2 96%   BMI 30.79 kg/m   Neurological  Exam: MENTAL STATUS including orientation to time, place, person, recent and remote memory, attention span and concentration, language, and fund of knowledge is normal.  Speech is not dysarthric.  CRANIAL NERVES: II:  No visual field defects. .   III-IV-VI: Pupils equal round and reactive to light.  Normal conjugate, extra-ocular eye movements in all directions of gaze.  No nystagmus.  No ptosis.   V:  Normal facial sensation.    VII:  Normal facial symmetry and movements.   VIII:  Normal hearing and vestibular function.   IX-X:  Normal palatal movement.   XI:  Normal shoulder shrug and head rotation.   XII:  Normal tongue strength and range of motion, no deviation or fasciculation.  MOTOR:  No atrophy, fasciculations or abnormal movements.  No pronator  drift.  Hyperpigmentation of skin overlying the toes. Upper Extremity:  Right  Left  Deltoid  5/5   5/5   Biceps  5/5   5/5   Triceps  5/5   5/5   Infraspinatus 5/5  5/5  Medial pectoralis 5/5  5/5  Wrist extensors  5/5   5/5   Wrist flexors  5/5   5/5   Finger extensors  5/5   5/5   Finger flexors  5/5   5/5   Dorsal interossei  5/5   5/5   Abductor pollicis  5/5   5/5   Tone (Ashworth scale)  0  0   Lower Extremity:  Right  Left  Hip flexors  5/5   5/5   Hip extensors  5/5   5/5   Adductor 5/5  5/5  Abductor 5/5  5/5  Knee flexors  5/5   5/5   Knee extensors  5/5   5/5   Dorsiflexors  5/5   5/5   Plantarflexors  5/5   5/5   Toe extensors  5/5   5/5   Toe flexors  5/5   5/5   Tone (Ashworth scale)  0  0   MSRs:  Right        Left                  brachioradialis 2+  2+  biceps 2+  2+  triceps 2+  2+  patellar 2+  2+  ankle jerk 0  0  Hoffman no  no  plantar response down  down   SENSORY:  Normal and symmetric perception of light touch, pinprick, vibration, and temperature.  Romberg's sign absent.   COORDINATION/GAIT: Normal finger-to- nose-finger.  Intact rapid alternating movements bilaterally.   Gait narrow  based and stable. Tandem and stressed gait intact.     Thank you for allowing me to participate in patient's care.  If I can answer any additional questions, I would be pleased to do so.    Sincerely,    Bertie Simien K. Posey Pronto, DO

## 2022-05-19 NOTE — Patient Instructions (Signed)
Please follow-up with your primary care doctor about who you should see for your skin changes

## 2022-07-01 ENCOUNTER — Other Ambulatory Visit: Payer: Self-pay | Admitting: Family Medicine

## 2022-07-01 DIAGNOSIS — F5101 Primary insomnia: Secondary | ICD-10-CM

## 2022-07-16 ENCOUNTER — Other Ambulatory Visit: Payer: Self-pay | Admitting: Family Medicine

## 2022-07-16 DIAGNOSIS — E038 Other specified hypothyroidism: Secondary | ICD-10-CM

## 2022-07-20 ENCOUNTER — Other Ambulatory Visit: Payer: Self-pay

## 2022-07-20 ENCOUNTER — Encounter (HOSPITAL_BASED_OUTPATIENT_CLINIC_OR_DEPARTMENT_OTHER): Payer: Self-pay

## 2022-07-20 ENCOUNTER — Emergency Department (HOSPITAL_BASED_OUTPATIENT_CLINIC_OR_DEPARTMENT_OTHER)
Admission: EM | Admit: 2022-07-20 | Discharge: 2022-07-20 | Disposition: A | Discharge status: Home / Self Care | Payer: Medicare Other | Attending: Emergency Medicine | Admitting: Emergency Medicine

## 2022-07-20 DIAGNOSIS — I10 Essential (primary) hypertension: Secondary | ICD-10-CM | POA: Insufficient documentation

## 2022-07-20 DIAGNOSIS — Z79899 Other long term (current) drug therapy: Secondary | ICD-10-CM | POA: Insufficient documentation

## 2022-07-20 DIAGNOSIS — Z7982 Long term (current) use of aspirin: Secondary | ICD-10-CM | POA: Diagnosis not present

## 2022-07-20 DIAGNOSIS — Z7902 Long term (current) use of antithrombotics/antiplatelets: Secondary | ICD-10-CM | POA: Insufficient documentation

## 2022-07-20 DIAGNOSIS — I2510 Atherosclerotic heart disease of native coronary artery without angina pectoris: Secondary | ICD-10-CM | POA: Diagnosis not present

## 2022-07-20 DIAGNOSIS — Z7984 Long term (current) use of oral hypoglycemic drugs: Secondary | ICD-10-CM | POA: Insufficient documentation

## 2022-07-20 DIAGNOSIS — E119 Type 2 diabetes mellitus without complications: Secondary | ICD-10-CM | POA: Diagnosis not present

## 2022-07-20 DIAGNOSIS — K648 Other hemorrhoids: Secondary | ICD-10-CM | POA: Diagnosis not present

## 2022-07-20 DIAGNOSIS — K6289 Other specified diseases of anus and rectum: Secondary | ICD-10-CM | POA: Diagnosis present

## 2022-07-20 LAB — CBG MONITORING, ED: Glucose-Capillary: 101 mg/dL — ABNORMAL HIGH (ref 70–99)

## 2022-07-20 MED ORDER — HYDROCORTISONE (PERIANAL) 2.5 % EX CREA: 1.0000 | TOPICAL_CREAM | Freq: Two times a day (BID) | CUTANEOUS | 0 refills | Status: DC

## 2022-07-20 MED ORDER — LIDOCAINE-EPINEPHRINE (PF) 2 %-1:200000 IJ SOLN
20.0000 mL | Freq: Once | INTRAMUSCULAR | Status: DC
  Filled 2022-07-20: qty 20

## 2022-07-20 MED ORDER — LIDOCAINE HCL URETHRAL/MUCOSAL 2 % EX GEL
1.0000 | Freq: Once | CUTANEOUS | Status: AC
  Administered 2022-07-20: 1 via TOPICAL
  Filled 2022-07-20: qty 11

## 2022-07-20 MED ORDER — LIDOCAINE-EPINEPHRINE-TETRACAINE (LET) TOPICAL GEL
3.0000 mL | Freq: Once | TOPICAL | Status: DC
  Filled 2022-07-20: qty 3

## 2022-07-20 MED ORDER — HYDROMORPHONE HCL 1 MG/ML IJ SOLN
1.0000 mg | Freq: Once | INTRAMUSCULAR | Status: DC
  Filled 2022-07-20: qty 1

## 2022-07-20 MED ORDER — PRAMOXINE HCL (PERIANAL) 1 % EX FOAM: 1.0000 | Freq: Three times a day (TID) | CUTANEOUS | 0 refills | Status: DC | PRN

## 2022-07-20 NOTE — Discharge Instructions (Signed)
It appears you have a prolapsed hemorrhoid.  You are leaving [facility] and did not want any further manipulation of the area today.  Follow-up with the surgeon at the number provided.  Return to the ED if you wish to be reevaluated.

## 2022-07-20 NOTE — ED Notes (Signed)
Pt refused medications and further treatment. Requesting to leave with a referral to a rectal surgeon. AMA explained, pt understands. MD is aware.

## 2022-07-20 NOTE — ED Provider Notes (Addendum)
[facility] DEPT Provider Note   CSN: [id number] Arrival date & time: 07/20/22  1145     History {Add pertinent medical, surgical, social history, OB history to HPI:1} Chief Complaint  Patient presents with   Rectal Pain    Nicolas Ray is a 71 y.o. male.  Patient with a history of hypertension, diabetes, CAD, ischemic cardiomyopathy, stents presenting with rectal pain for the past 3 to 4 days.  He reports constant burning in his rectum since "eating chickpeas" which she believes he is allergic to.  States since then he has had burning in his rectum despite using over-the-counter hemorrhoid cream.  Denies any black or bloody stools.  Denies any diarrhea.  States his bowel movements have been normal.  No abdominal pain.  No chest pain or shortness of breath no cough, vomiting, fever, chest pain or shortness of breath.  Denies any blood thinner use the medication list includes aspirin and Plavix. He states he is only here for the burning in his rectum and denies any other complaints and does not want anything else examined.  The history is provided by the patient.       Home Medications Prior to Admission medications   Medication Sig Start Date End Date Taking? Authorizing Provider  aspirin EC 81 MG tablet Take 1 tablet by mouth daily. Maybe once a month    [provider]  carvedilol (COREG) 12.5 MG tablet TAKE 1 TABLET(12.5 MG) BY MOUTH TWICE DAILY 07/23/21   Sherren Mocha, MD  Cholecalciferol (VITAMIN D3) 125 MCG (5000 UT) CAPS Take 5,000 Units by mouth See admin instructions. 2-3 times a week    [provider]  clopidogrel (PLAVIX) 75 MG tablet TAKE 1 TABLET(75 MG) BY MOUTH DAILY 01/24/22   Sherren Mocha, MD  co-enzyme Q-10 30 MG capsule Take 30 mg by mouth daily.     [provider]  gabapentin (NEURONTIN) 300 MG capsule TAKE 1 CAPSULE(300 MG) BY MOUTH AT BEDTIME 07/01/22   Haydee Salter, MD  glipiZIDE (GLUCOTROL) 5 MG  tablet Take 1 tablet (5 mg total) by mouth 2 (two) times daily before a meal. Patient not taking: Reported on 05/19/2022 01/15/22   Haydee Salter, MD  levothyroxine (SYNTHROID) 150 MCG tablet Take 1 tablet (150 mcg total) by mouth daily before breakfast. 07/16/22 10/14/22  Bonnita Hollow, MD  nitroGLYCERIN (NITROSTAT) 0.4 MG SL tablet Place 1 tablet (0.4 mg total) under the tongue every 5 (five) minutes as needed for chest pain. Patient not taking: Reported on 05/19/2022 05/18/20 12/02/21  Richardson Dopp T, PA-C  rosuvastatin (CRESTOR) 10 MG tablet TAKE 1 TABLET BY MOUTH DAILY 11/21/21   Sherren Mocha, MD      Allergies    Patient has no known allergies.    Review of Systems   Review of Systems  Constitutional:  Negative for activity change, appetite change and fever.  HENT:  Negative for congestion and rhinorrhea.   Respiratory:  Negative for cough, chest tightness and shortness of breath.   Gastrointestinal:  Positive for rectal pain. Negative for vomiting.  Genitourinary:  Negative for dysuria and hematuria.  Musculoskeletal:  Negative for arthralgias and myalgias.  Skin:  Negative for rash.  Neurological:  Negative for dizziness, weakness and headaches.    all other systems are negative except as noted in the HPI and PMH.   Physical Exam Updated Vital Signs BP (!) 128/108 (BP Location: Right Arm)   Pulse 93   Temp 98.2 F (  36.8 C) (Temporal)   Resp 18   Ht 5' 7.5" (1.715 m)   Wt 91.2 kg   SpO2 99%   BMI 31.03 kg/m  Physical Exam Vitals and nursing note reviewed.  Constitutional:      General: He is not in acute distress.    Appearance: He is well-developed.  HENT:     Head: Normocephalic and atraumatic.     Mouth/Throat:     Pharynx: No oropharyngeal exudate.  Eyes:     Conjunctiva/sclera: Conjunctivae normal.     Pupils: Pupils are equal, round, and reactive to light.  Neck:     Comments: No meningismus. Cardiovascular:     Rate and Rhythm: Normal rate and  regular rhythm.     Heart sounds: Normal heart sounds. No murmur heard. Pulmonary:     Effort: Pulmonary effort is normal. No respiratory distress.     Breath sounds: Normal breath sounds.  Chest:     Chest wall: No tenderness.  Abdominal:     Palpations: Abdomen is soft.     Tenderness: There is no abdominal tenderness. There is no guarding or rebound.  Genitourinary:    Comments: Chaperone present [name].  Tender protruding mass from rectum without surrounding erythema or drainage. Musculoskeletal:        General: No tenderness. Normal range of motion.     Cervical back: Normal range of motion and neck supple.  Skin:    General: Skin is warm.  Neurological:     Mental Status: He is alert and oriented to person, place, and time.     Cranial Nerves: No cranial nerve deficit.     Motor: No abnormal muscle tone.     Coordination: Coordination normal.     Comments: No ataxia on finger to nose bilaterally. No pronator drift. 5/5 strength throughout. CN 2-12 intact.Equal grip strength. Sensation intact.   Psychiatric:        Behavior: Behavior normal.     ED Results / Procedures / Treatments   Labs (all labs ordered are listed, but only abnormal results are displayed) Labs Reviewed  OCCULT BLOOD X 1 CARD TO LAB, STOOL  CBG MONITORING, ED    EKG None  Radiology No results found.  Procedures Procedures  {Document cardiac monitor, telemetry assessment procedure when appropriate:1}  Medications Ordered in ED Medications  lidocaine (XYLOCAINE) 2 % jelly 1 Application (1 Application Topical Given 07/20/22 1516)    ED Course/ Medical Decision Making/ A&P                           Medical Decision Making Amount and/or Complexity of Data Reviewed Labs: ordered. Decision-making details documented in ED Course. Radiology: ordered and independent interpretation performed. Decision-making details documented in ED Course. ECG/medicine tests: ordered and independent  interpretation performed. Decision-making details documented in ED Course.  Risk OTC drugs. Prescription drug management.   Burning rectal pain for the past 4 days.  Vital stable, no distress.  No fever.  Abdomen soft and nontender.  Exam concerning for prolapsed internal hemorrhoid.  Patient given topical lidocaine jelly as well as sugar placed to the outside of the rectum.  He reports severe pain and unable to tolerate any manipulation of the hemorrhoid. He is requesting more numbing of the area.  He refuses local numbing injection.  He refuses topical lidocaine and IM Dilaudid.  He is refusing more topical lidocaine refusing, IM Dilaudid, refusing further manipulation attempts of the  hemorrhoid.  He states he just wants to leave and follow-up with a rectal surgeon. Explained to the patient that he be leaving his medical advice  Patient refusing any further manipulation or any further medications.  He is asking for referral to a rectal surgeon.  Discussed he will be leaving AMA as he still has a prolapsed hemorrhoid which has not been reduced.  He appears to have capacity to make this decision. {Document critical care time when appropriate:1} {Document review of labs and clinical decision tools ie heart score, Chads2Vasc2 etc:1}  {Document your independent review of radiology images, and any outside records:1} {Document your discussion with family members, caretakers, and with consultants:1} {Document social determinants of health affecting pt's care:1} {Document your decision making why or why not admission, treatments were needed:1} Final Clinical Impression(s) / ED Diagnoses Final diagnoses:  None    Rx / DC Orders ED Discharge Orders     None

## 2022-07-20 NOTE — ED Notes (Addendum)
Lidocaine jelly and sugar were placed at rectum per Provider request.

## 2022-07-20 NOTE — ED Notes (Signed)
ED Provider at bedside. 

## 2022-07-20 NOTE — ED Triage Notes (Signed)
Patient here POV from Home.  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 Rectal Bleeding noted originally that has since subsided.   Possible Hemorrhoids.   NAD Noted during Triage. A&Ox4. GCS 15. Ambulatory.

## 2022-07-23 DIAGNOSIS — K645 Perianal venous thrombosis: Secondary | ICD-10-CM | POA: Diagnosis not present

## 2022-07-23 DIAGNOSIS — Z1211 Encounter for screening for malignant neoplasm of colon: Secondary | ICD-10-CM | POA: Diagnosis not present

## 2022-07-28 ENCOUNTER — Telehealth: Payer: Self-pay

## 2022-07-28 NOTE — Telephone Encounter (Signed)
        Patient  visited [facility] on 11/18   Telephone encounter attempt :  1st  A HIPAA compliant voice message was left requesting a return call.  Instructed patient to call back     [place], [facility]  [phone] [address], [place],  [address] Phone: [phone] Email: [email or web address]

## 2022-07-29 ENCOUNTER — Telehealth: Payer: Self-pay

## 2022-07-29 NOTE — Telephone Encounter (Signed)
        Patient  visited [facility] on 11/19   Telephone encounter attempt :  1st  A HIPAA compliant voice message was left requesting a return call.  Instructed patient to call back    [place], [facility]  [phone] [address], [place], [place] [address] Phone: [phone] Email: [email or web address]

## 2022-08-18 ENCOUNTER — Other Ambulatory Visit: Payer: Self-pay | Admitting: Cardiovascular Disease

## 2022-08-29 DIAGNOSIS — E1165 Type 2 diabetes mellitus with hyperglycemia: Secondary | ICD-10-CM | POA: Diagnosis not present

## 2022-08-29 DIAGNOSIS — M545 Low back pain, unspecified: Secondary | ICD-10-CM | POA: Diagnosis not present

## 2022-08-29 DIAGNOSIS — I2510 Atherosclerotic heart disease of native coronary artery without angina pectoris: Secondary | ICD-10-CM | POA: Diagnosis not present

## 2022-08-29 DIAGNOSIS — I872 Venous insufficiency (chronic) (peripheral): Secondary | ICD-10-CM | POA: Diagnosis not present

## 2022-09-11 ENCOUNTER — Telehealth: Payer: Self-pay

## 2022-09-11 NOTE — Patient Outreach (Signed)
  Care Coordination   09/11/2022 Name: OSHER OETTINGER MRN: [id number] DOB: [birth year]   Care Coordination Outreach Attempts:  An unsuccessful telephone outreach was attempted today to offer the patient information about available care coordination services as a benefit of their health plan.   Follow Up Plan:  Additional outreach attempts will be made to offer the patient care coordination information and services.   Encounter Outcome:  No Answer   Care Coordination Interventions:  No, not indicated    Jone Baseman, RN, MSN [facility] Care Management Coordinator Direct Line [phone]

## 2022-09-25 ENCOUNTER — Telehealth: Payer: Self-pay

## 2022-09-25 NOTE — Patient Outreach (Signed)
  Care Coordination   09/25/2022 Name: Nicolas Ray MRN: [id number] DOB: [birth year]   Care Coordination Outreach Attempts:  A second unsuccessful outreach was attempted today to offer the patient with information about available care coordination services as a benefit of their health plan.     Follow Up Plan:  Additional outreach attempts will be made to offer the patient care coordination information and services.   Encounter Outcome:  No Answer   Care Coordination Interventions:  No, not indicated    Jone Baseman, RN, MSN [facility] Care Management Coordinator Direct Line [phone]

## 2022-09-30 ENCOUNTER — Other Ambulatory Visit: Payer: Self-pay | Admitting: Family Medicine

## 2022-09-30 DIAGNOSIS — F5101 Primary insomnia: Secondary | ICD-10-CM

## 2022-09-30 NOTE — Telephone Encounter (Signed)
Patient last refill was 07-01-22 his last OV was in 01-15-22. No future appointments

## 2022-09-30 NOTE — Telephone Encounter (Signed)
Lft VM to rtn call to schedule an appointment.  Dm/cma  

## 2022-10-07 ENCOUNTER — Telehealth: Payer: Self-pay

## 2022-10-07 NOTE — Patient Outreach (Signed)
  Care Coordination   10/07/2022 Name: Nicolas Ray MRN: [id number] DOB: 12-01-1950   Care Coordination Outreach Attempts:  A third unsuccessful outreach was attempted today to offer the patient with information about available care coordination services as a benefit of their health plan.   Follow Up Plan:  No further outreach attempts will be made at this time. We have been unable to contact the patient to offer or enroll patient in care coordination services  Encounter Outcome:  No Answer    Care Coordination Interventions:  No, not indicated    Jone Baseman, RN, MSN [facility] Care Management Coordinator Direct Line [phone]

## 2022-10-17 ENCOUNTER — Other Ambulatory Visit: Payer: Self-pay | Admitting: Cardiovascular Disease

## 2022-10-21 NOTE — Telephone Encounter (Signed)
Lft VM to rtn call to schedule an appointment.  Dm/cma

## 2022-11-01 ENCOUNTER — Encounter (HOSPITAL_COMMUNITY): Payer: Self-pay | Admitting: *Deleted

## 2022-11-01 ENCOUNTER — Other Ambulatory Visit: Payer: Self-pay

## 2022-11-01 ENCOUNTER — Emergency Department (HOSPITAL_COMMUNITY)
Admission: EM | Admit: 2022-11-01 | Discharge: 2022-11-01 | Discharge status: Against Medical Advice | Payer: 59 | Attending: Emergency Medicine | Admitting: Emergency Medicine

## 2022-11-01 DIAGNOSIS — K5900 Constipation, unspecified: Secondary | ICD-10-CM | POA: Diagnosis not present

## 2022-11-01 DIAGNOSIS — Z5321 Procedure and treatment not carried out due to patient leaving prior to being seen by health care provider: Secondary | ICD-10-CM | POA: Diagnosis not present

## 2022-11-01 DIAGNOSIS — R0602 Shortness of breath: Secondary | ICD-10-CM | POA: Insufficient documentation

## 2022-11-01 LAB — COMPREHENSIVE METABOLIC PANEL
ALT: 15 U/L (ref 0–44)
AST: 17 U/L (ref 15–41)
Albumin: 3.3 g/dL — ABNORMAL LOW (ref 3.5–5.0)
Alkaline Phosphatase: 59 U/L (ref 38–126)
Anion gap: 12 (ref 5–15)
BUN: 41 mg/dL — ABNORMAL HIGH (ref 8–23)
CO2: 21 mmol/L — ABNORMAL LOW (ref 22–32)
Calcium: 8.7 mg/dL — ABNORMAL LOW (ref 8.9–10.3)
Chloride: 99 mmol/L (ref 98–111)
Creatinine, Ser: 2.34 mg/dL — ABNORMAL HIGH (ref 0.61–1.24)
GFR, Estimated: 29 mL/min — ABNORMAL LOW (ref >60)
Glucose, Bld: 205 mg/dL — ABNORMAL HIGH (ref 70–99)
Potassium: 4 mmol/L (ref 3.5–5.1)
Sodium: 132 mmol/L — ABNORMAL LOW (ref 135–145)
Total Bilirubin: 1.8 mg/dL — ABNORMAL HIGH (ref 0.3–1.2)
Total Protein: 6.9 g/dL (ref 6.5–8.1)

## 2022-11-01 LAB — CBC
HCT: 43.1 % (ref 39.0–52.0)
Hemoglobin: 13.7 g/dL (ref 13.0–17.0)
MCH: 21.8 pg — ABNORMAL LOW (ref 26.0–34.0)
MCHC: 31.8 g/dL (ref 30.0–36.0)
MCV: 68.6 fL — ABNORMAL LOW (ref 80.0–100.0)
Platelets: 145 10*3/uL — ABNORMAL LOW (ref 150–400)
RBC: 6.28 MIL/uL — ABNORMAL HIGH (ref 4.22–5.81)
RDW: 14.4 % (ref 11.5–15.5)
WBC: 10 10*3/uL (ref 4.0–10.5)
nRBC: 0 % (ref 0.0–0.2)

## 2022-11-01 LAB — LIPASE, BLOOD: Lipase: 28 U/L (ref 11–51)

## 2022-11-01 NOTE — ED Notes (Signed)
Pt eloped from ER prior to being seen by this RN or a provider.

## 2022-11-01 NOTE — ED Triage Notes (Signed)
The pt is c/o constipation and shortness of breath since last pm  not happy about answering questions

## 2022-11-01 NOTE — ED Notes (Signed)
This RN was informed by another RN and NT that the pt left prior to being seen by this RN or a provider because he did not want to wait any longer.

## 2022-11-01 NOTE — ED Notes (Signed)
Pt is leaving he stated he came here for a simple thing and he can just go to the pharmacy to get what he needs. A nurse tried talking and explain to him he didn't want to hear what she had to say.

## 2022-11-16 ENCOUNTER — Other Ambulatory Visit: Payer: Self-pay | Admitting: Cardiovascular Disease

## 2022-11-16 ENCOUNTER — Other Ambulatory Visit: Payer: Self-pay | Admitting: Family Medicine

## 2022-11-16 DIAGNOSIS — E038 Other specified hypothyroidism: Secondary | ICD-10-CM

## 2022-11-18 ENCOUNTER — Other Ambulatory Visit: Payer: Self-pay

## 2022-11-18 DIAGNOSIS — Z79899 Other long term (current) drug therapy: Secondary | ICD-10-CM | POA: Diagnosis not present

## 2022-11-18 DIAGNOSIS — Z91148 Patient's other noncompliance with medication regimen for other reason: Secondary | ICD-10-CM | POA: Diagnosis not present

## 2022-11-18 DIAGNOSIS — J9690 Respiratory failure, unspecified, unspecified whether with hypoxia or hypercapnia: Secondary | ICD-10-CM | POA: Diagnosis not present

## 2022-11-18 DIAGNOSIS — I130 Hypertensive heart and chronic kidney disease with heart failure and stage 1 through stage 4 chronic kidney disease, or unspecified chronic kidney disease: Secondary | ICD-10-CM | POA: Diagnosis not present

## 2022-11-18 DIAGNOSIS — I161 Hypertensive emergency: Secondary | ICD-10-CM | POA: Diagnosis not present

## 2022-11-18 DIAGNOSIS — R9431 Abnormal electrocardiogram [ECG] [EKG]: Secondary | ICD-10-CM | POA: Diagnosis not present

## 2022-11-18 DIAGNOSIS — I10 Essential (primary) hypertension: Secondary | ICD-10-CM | POA: Diagnosis not present

## 2022-11-18 DIAGNOSIS — R000 Tachycardia, unspecified: Secondary | ICD-10-CM | POA: Diagnosis not present

## 2022-11-18 DIAGNOSIS — R0902 Hypoxemia: Secondary | ICD-10-CM | POA: Diagnosis not present

## 2022-11-18 DIAGNOSIS — R069 Unspecified abnormalities of breathing: Secondary | ICD-10-CM | POA: Diagnosis not present

## 2022-11-18 DIAGNOSIS — Z91199 Patient's noncompliance with other medical treatment and regimen due to unspecified reason: Secondary | ICD-10-CM | POA: Diagnosis not present

## 2022-11-18 DIAGNOSIS — E039 Hypothyroidism, unspecified: Secondary | ICD-10-CM | POA: Diagnosis not present

## 2022-11-18 DIAGNOSIS — I2510 Atherosclerotic heart disease of native coronary artery without angina pectoris: Secondary | ICD-10-CM | POA: Diagnosis not present

## 2022-11-18 DIAGNOSIS — I110 Hypertensive heart disease with heart failure: Secondary | ICD-10-CM | POA: Diagnosis not present

## 2022-11-18 DIAGNOSIS — I255 Ischemic cardiomyopathy: Secondary | ICD-10-CM | POA: Diagnosis not present

## 2022-11-18 DIAGNOSIS — J9601 Acute respiratory failure with hypoxia: Secondary | ICD-10-CM | POA: Diagnosis not present

## 2022-11-18 DIAGNOSIS — Z955 Presence of coronary angioplasty implant and graft: Secondary | ICD-10-CM | POA: Diagnosis not present

## 2022-11-18 DIAGNOSIS — R0603 Acute respiratory distress: Secondary | ICD-10-CM | POA: Diagnosis not present

## 2022-11-18 DIAGNOSIS — I509 Heart failure, unspecified: Secondary | ICD-10-CM | POA: Diagnosis not present

## 2022-11-18 DIAGNOSIS — R231 Pallor: Secondary | ICD-10-CM | POA: Diagnosis not present

## 2022-11-18 DIAGNOSIS — Z7902 Long term (current) use of antithrombotics/antiplatelets: Secondary | ICD-10-CM | POA: Diagnosis not present

## 2022-11-18 DIAGNOSIS — F17210 Nicotine dependence, cigarettes, uncomplicated: Secondary | ICD-10-CM | POA: Diagnosis not present

## 2022-11-18 DIAGNOSIS — I998 Other disorder of circulatory system: Secondary | ICD-10-CM | POA: Diagnosis not present

## 2022-11-18 DIAGNOSIS — R600 Localized edema: Secondary | ICD-10-CM | POA: Diagnosis not present

## 2022-11-18 DIAGNOSIS — I252 Old myocardial infarction: Secondary | ICD-10-CM | POA: Diagnosis not present

## 2022-11-18 DIAGNOSIS — R0602 Shortness of breath: Secondary | ICD-10-CM | POA: Diagnosis not present

## 2022-11-18 DIAGNOSIS — N1832 Chronic kidney disease, stage 3b: Secondary | ICD-10-CM | POA: Diagnosis not present

## 2022-11-18 DIAGNOSIS — G4733 Obstructive sleep apnea (adult) (pediatric): Secondary | ICD-10-CM | POA: Diagnosis not present

## 2022-11-18 DIAGNOSIS — E1122 Type 2 diabetes mellitus with diabetic chronic kidney disease: Secondary | ICD-10-CM | POA: Diagnosis not present

## 2022-11-18 DIAGNOSIS — E1142 Type 2 diabetes mellitus with diabetic polyneuropathy: Secondary | ICD-10-CM | POA: Diagnosis not present

## 2022-11-18 DIAGNOSIS — E785 Hyperlipidemia, unspecified: Secondary | ICD-10-CM | POA: Diagnosis not present

## 2022-11-18 DIAGNOSIS — I5023 Acute on chronic systolic (congestive) heart failure: Secondary | ICD-10-CM | POA: Diagnosis not present

## 2022-11-18 MED ORDER — CLOPIDOGREL BISULFATE 75 MG PO TABS: ORAL_TABLET | ORAL | 0 refills | Status: DC

## 2022-11-19 DIAGNOSIS — I255 Ischemic cardiomyopathy: Secondary | ICD-10-CM | POA: Insufficient documentation

## 2022-11-19 DIAGNOSIS — Z91148 Patient's other noncompliance with medication regimen for other reason: Secondary | ICD-10-CM | POA: Diagnosis not present

## 2022-11-19 DIAGNOSIS — I161 Hypertensive emergency: Secondary | ICD-10-CM | POA: Diagnosis not present

## 2022-11-19 DIAGNOSIS — N1832 Chronic kidney disease, stage 3b: Secondary | ICD-10-CM | POA: Diagnosis not present

## 2022-11-19 DIAGNOSIS — I509 Heart failure, unspecified: Secondary | ICD-10-CM | POA: Diagnosis not present

## 2022-11-19 DIAGNOSIS — I350 Nonrheumatic aortic (valve) stenosis: Secondary | ICD-10-CM | POA: Diagnosis not present

## 2022-11-20 ENCOUNTER — Telehealth: Payer: Self-pay | Admitting: Cardiovascular Disease

## 2022-11-20 DIAGNOSIS — Z91148 Patient's other noncompliance with medication regimen for other reason: Secondary | ICD-10-CM | POA: Diagnosis not present

## 2022-11-20 DIAGNOSIS — I161 Hypertensive emergency: Secondary | ICD-10-CM | POA: Diagnosis not present

## 2022-11-20 DIAGNOSIS — I5020 Unspecified systolic (congestive) heart failure: Secondary | ICD-10-CM | POA: Diagnosis not present

## 2022-11-20 DIAGNOSIS — R000 Tachycardia, unspecified: Secondary | ICD-10-CM | POA: Diagnosis not present

## 2022-11-20 DIAGNOSIS — I255 Ischemic cardiomyopathy: Secondary | ICD-10-CM | POA: Diagnosis not present

## 2022-11-20 DIAGNOSIS — I350 Nonrheumatic aortic (valve) stenosis: Secondary | ICD-10-CM | POA: Diagnosis not present

## 2022-11-20 DIAGNOSIS — I509 Heart failure, unspecified: Secondary | ICD-10-CM | POA: Diagnosis not present

## 2022-11-20 DIAGNOSIS — N1832 Chronic kidney disease, stage 3b: Secondary | ICD-10-CM | POA: Diagnosis not present

## 2022-11-20 NOTE — Telephone Encounter (Signed)
Lft VM to rtn call to schedule an appointment.  Dm/cma  

## 2022-11-20 NOTE — Telephone Encounter (Signed)
Patient called to say that he is in the hospital in [place] and would like to be transfer to [facility]. He states they are telling him he can't leave until the results are back. He calling our office to make dr cooper aware of it, and to see what can be done. Please advise

## 2022-11-21 NOTE — Telephone Encounter (Signed)
Spoke with patient, appointment scheduled. No further needs at this time.

## 2022-12-02 ENCOUNTER — Encounter: Payer: Self-pay | Admitting: Cardiovascular Disease

## 2022-12-02 ENCOUNTER — Ambulatory Visit: Payer: 59 | Attending: Cardiovascular Disease | Admitting: Cardiovascular Disease

## 2022-12-02 VITALS — BP 130/90 | HR 72 | Ht 68.0 in | Wt 200.6 lb

## 2022-12-02 DIAGNOSIS — E782 Mixed hyperlipidemia: Secondary | ICD-10-CM

## 2022-12-02 DIAGNOSIS — E118 Type 2 diabetes mellitus with unspecified complications: Secondary | ICD-10-CM

## 2022-12-02 DIAGNOSIS — F17200 Nicotine dependence, unspecified, uncomplicated: Secondary | ICD-10-CM

## 2022-12-02 DIAGNOSIS — I25118 Atherosclerotic heart disease of native coronary artery with other forms of angina pectoris: Secondary | ICD-10-CM | POA: Diagnosis not present

## 2022-12-02 DIAGNOSIS — I5043 Acute on chronic combined systolic (congestive) and diastolic (congestive) heart failure: Secondary | ICD-10-CM | POA: Diagnosis not present

## 2022-12-02 DIAGNOSIS — N1832 Chronic kidney disease, stage 3b: Secondary | ICD-10-CM

## 2022-12-02 LAB — BASIC METABOLIC PANEL
BUN/Creatinine Ratio: 20 (ref 10–24)
BUN: 46 mg/dL — ABNORMAL HIGH (ref 8–27)
CO2: 27 mmol/L (ref 20–29)
Calcium: 9.2 mg/dL (ref 8.6–10.2)
Chloride: 104 mmol/L (ref 96–106)
Creatinine, Ser: 2.34 mg/dL — ABNORMAL HIGH (ref 0.76–1.27)
Glucose: 114 mg/dL — ABNORMAL HIGH (ref 70–99)
Potassium: 4.5 mmol/L (ref 3.5–5.2)
Sodium: 138 mmol/L (ref 134–144)
eGFR: 29 mL/min/{1.73_m2} — ABNORMAL LOW (ref >59)

## 2022-12-02 LAB — CBC
Hematocrit: 41.6 % (ref 37.5–51.0)
Hemoglobin: 13.6 g/dL (ref 13.0–17.7)
MCH: 21.9 pg — ABNORMAL LOW (ref 26.6–33.0)
MCHC: 32.7 g/dL (ref 31.5–35.7)
MCV: 67 fL — ABNORMAL LOW (ref 79–97)
Platelets: 184 10*3/uL (ref 150–450)
RBC: 6.22 x10E6/uL — ABNORMAL HIGH (ref 4.14–5.80)
RDW: 16.8 % — ABNORMAL HIGH (ref 11.6–15.4)
WBC: 8.5 10*3/uL (ref 3.4–10.8)

## 2022-12-02 MED ORDER — FUROSEMIDE 20 MG PO TABS: 60.0000 mg | ORAL_TABLET | Freq: Every day | ORAL | 3 refills | Status: DC

## 2022-12-02 NOTE — Progress Notes (Signed)
Cardiology Office Note:    Date:  12/08/2022   ID:  Nicolas Ray, DOB 1951/07/23, MRN [id number]  PCP:  Nicolas Mast, MD   [facility] Providers Cardiologist:  None     Referring MD: Nicolas Mast, MD   Chief Complaint  Patient presents with   Shortness of Breath    History of Present Illness:    Nicolas Ray is a 72 y.o. male with a hx of: Coronary artery disease  S/p Lat STEMI 8/16 >> s/p DES to D1 Patient refused CABG >> staged PCI: DES to mid LAD and DES to prox RCA Chronic systolic CHF Ischemic CM ACEi DCd in past due to worsening Creatinine  Echocardiogram 8/16: EF 40-45 Echocardiogram 5/17: EF 35-40 Diabetes mellitus  Chronic kidney disease 3 Hypertension  Medical non-adherence  Tobacco use R hemidiaphragm paralysis Mild OSA  The patient is here alone today.  He has a complex history related to multiple medication intolerances and medication noncompliance.  He was recently driving his car when he developed acute shortness of breath and he stopped to call EMS.  He was taken to [facility].  I do not have all of the notes, but I have been able to review an echocardiogram which demonstrated very severe LV systolic dysfunction with LVEF of 15% as well as grade 3 diastolic dysfunction with restrictive physiology.  His chest x-ray is reviewed and showed pulmonary edema.  He was treated with BiPAP and diuresis.  The best I can tell from my discussion with him today, he signed out AMA the day after he was admitted.  He states that he was feeling fine and did not need to stay in the hospital any longer.  He does complain of some ongoing fatigue.  He has no chest pain or pressure.  He denies orthopnea or PND.  Has had some leg swelling but none at present.  Past Medical History:  Diagnosis Date   Acute MI, lateral wall, initial episode of care 04/18/2015   CAD (coronary artery disease) 04/18/2015   a. Lat STEMI 8/16 - LHC:  pLAD 90, mLAD 50, D1  100, dLCx 90, OM1 80, pRCA 75, EF 35-45% >> DES to D1;  b. staged PCI 04/20/15 - DES to mid LAD and DES to prox RCA   Depression    DM2 (diabetes mellitus, type 2)    HTN (hypertension) 02/08/2013   Renal Artery [name] 9/16: Bilateral RAS 1-59%, SMA >70%   Hypothyroidism    Ischemic cardiomyopathy    a. Echo 8/16:  Mild LVH, EF 40-45%, mid-apical anterolateral and apical AK, grade 1 diastolic dysfunction, trivial effusion   OSA (obstructive sleep apnea) 09/21/2018   ST elevation myocardial infarction (STEMI) of lateral wall 04/18/2015    Past Surgical History:  Procedure Laterality Date   CARDIAC CATHETERIZATION N/A 04/18/2015   Procedure: Left Heart Cath and Coronary Angiography;  Surgeon: Nicolas Bollman, MD;  Location: [facility] INVASIVE CV LAB;  Service: Cardiovascular;  Laterality: N/A;   CARDIAC CATHETERIZATION N/A 04/18/2015   Procedure: Coronary Stent Intervention;  Surgeon: Nicolas Bollman, MD;  Location: [facility] INVASIVE CV LAB;  Service: Cardiovascular;  Laterality: N/A;   CARDIAC CATHETERIZATION N/A 04/20/2015   Procedure: Coronary Stent Intervention;  Surgeon: Nicolas Hazel, MD;  Location: [facility] INVASIVE CV LAB;  Service: Cardiovascular;  Laterality: N/A;   CORONARY STENT PLACEMENT      Current Medications: Current Meds  Medication Sig   Cholecalciferol (VITAMIN D3 PO) Take  1 capsule by mouth in the morning.   COENZYME Q10 PO Take 1 capsule by mouth in the morning.   furosemide (LASIX) 20 MG tablet Take 3 tablets (60 mg total) by mouth daily. (Patient taking differently: Take 20 mg by mouth in the morning.)   gabapentin (NEURONTIN) 300 MG capsule TAKE 1 CAPSULE(300 MG) BY MOUTH AT BEDTIME   levothyroxine (SYNTHROID) 150 MCG tablet TAKE 1 TABLET(150 MCG) BY MOUTH DAILY BEFORE BREAKFAST   metoprolol succinate (TOPROL-XL) 25 MG 24 hr tablet Take 25 mg by mouth in the morning.   rosuvastatin (CRESTOR) 10 MG tablet TAKE 1 TABLET(10 MG) BY MOUTH DAILY   [DISCONTINUED] aspirin EC 81 MG tablet  Take 1 tablet by mouth daily. Maybe once a month   [DISCONTINUED] carvedilol (COREG) 12.5 MG tablet TAKE 1 TABLET(12.5 MG) BY MOUTH TWICE DAILY   [DISCONTINUED] clopidogrel (PLAVIX) 75 MG tablet TAKE 1 TABLET(75 MG) BY MOUTH DAILY   [DISCONTINUED] furosemide (LASIX) 20 MG tablet Take by mouth daily.   [DISCONTINUED] glipiZIDE (GLUCOTROL) 5 MG tablet Take 1 tablet (5 mg total) by mouth 2 (two) times daily before a meal.   [DISCONTINUED] hydrocortisone (ANUSOL-HC) 2.5 % rectal cream Place 1 Application rectally 2 (two) times daily.   [DISCONTINUED] pramoxine (PROCTOFOAM) 1 % foam Place 1 Application rectally 3 (three) times daily as needed for anal itching.     Allergies:   Patient has no known allergies.   Social History   Socioeconomic History   Marital status: Divorced    Spouse name: Not on file   Number of children: 3   Years of education: Not on file   Highest education level: Not on file  Occupational History   Occupation: Retired- Airline pilot  Tobacco Use   Smoking status: Light Smoker    Packs/day: .25    Types: Cigarettes   Smokeless tobacco: Never   Tobacco comments:    1-3 cigarettes per day 12.4.19  Vaping Use   Vaping Use: Never used  Substance and Sexual Activity   Alcohol use: Yes   Drug use: No   Sexual activity: Not on file  Other Topics Concern   Not on file  Social History Narrative   Right Handed    Lives in a ranch style home with a basement.    Social Determinants of Health   Financial Resource Strain: Not on file  Food Insecurity: Not on file  Transportation Needs: Not on file  Physical Activity: Not on file  Stress: Not on file  Social Connections: Not on file     Family History: The patient's family history includes Cancer in his father; Heart disease in his mother; Stroke in his brother; Thyroid disease in his sister. There is no history of Colon cancer or Diabetes.  ROS:   Please see the history of present illness.    All other systems  reviewed and are negative.  EKGs/Labs/Other Studies Reviewed:    The following studies were reviewed today: Cardiac Studies & Procedures   CARDIAC CATHETERIZATION  CARDIAC CATHETERIZATION 04/20/2015  Narrative  Prox RCA lesion, 75% stenosed. A drug-eluting stent was placed. There is a 0% residual stenosis post intervention.  Prox LAD lesion, 90% stenosed. A drug eluting stent was placed. There is a 0% residual stenosis post intervention.  A drug-eluting stent was placed.  Recommendations: Continue ASA, Brilinta, statin, beta blocker, Ace-inh. Will continue NTG drip post stent placement given mild chest pressure. EKG post PCI with no ST changes.  Findings Coronary Findings Diagnostic  Dominance: Right  Left Main The vessel is angiographically normal.  Left Anterior Descending diffuse . diffuse .  Diffuse severe LAD stenosis just beyond the origin of the first diagonal  First Diagonal Branch diffuse thrombotic with heavy thrombus .  Previously placed 1st Diag drug eluting stent is patent.  Left Circumflex discrete .  First Obtuse Marginal Branch diffuse .  Right Coronary Artery eccentric .  Ulcerated-appearing plaque  Intervention  Prox LAD lesion PCI (Also treats lesions: Mid LAD) The pre-interventional distal flow is normal (TIMI 3). Pre-stent angioplasty was performed. A drug-eluting stent was placed. The strut is apposed. Post-stent angioplasty was performed. The post-interventional distal flow is normal (TIMI 3). The intervention was successful. No complications occurred at this lesion. There is a 0% residual stenosis post intervention.  Mid LAD lesion PCI (Also treats lesions: Prox LAD) See details in Prox LAD lesion. There is a 0% residual stenosis post intervention.  Prox RCA lesion PCI The pre-interventional distal flow is normal (TIMI 3). Pre-stent angioplasty was performed. A drug-eluting stent was placed. The strut is apposed. Post-stent angioplasty  was performed. The post-interventional distal flow is normal (TIMI 3). The intervention was successful. No complications occurred at this lesion. There is a 0% residual stenosis post intervention.   CARDIAC CATHETERIZATION 04/18/2015  Narrative 1. Prox LAD lesion, 90% stenosed. 2. Mid LAD lesion, 50% stenosed. 3. 1st Mrg lesion, 80% stenosed. 4. Dist Cx lesion, 90% stenosed. 5. Prox RCA lesion, 75% stenosed. 6. There is moderate left ventricular systolic dysfunction. 7. 1st Diag lesion, 100% stenosed. There is a 0% residual stenosis post intervention. 8. A drug-eluting stent was placed.  Final conclusions:  Total occlusion of the first diagonal treated successfully with primary PCI using a long DES platform  Severe multivessel CAD with severe LAD stenosis, severe OM and distal LCx stenosis, and moderately severe ulcerated plaque in the proximal RCA  Moderate segmental LV systolic dysfunction c/w an acute lateral wall (diagonal) infarct  Recommendations: Need to consider revascularization options (multivessel stenting versus CABG). The patient appears to be noncompliant based on history and current interaction. Will review with colleagues and discuss options further with patient.  Findings Coronary Findings Diagnostic  Dominance: Right  Left Main The vessel is angiographically normal.  Left Anterior Descending diffuse . diffuse .  Diffuse severe LAD stenosis just beyond the origin of the first diagonal  First Diagonal Branch diffuse thrombotic with heavy thrombus .  Left Circumflex discrete .  First Obtuse Marginal Branch diffuse .  Right Coronary Artery eccentric .  Ulcerated-appearing plaque  Intervention  1st Diag lesion PCI There is no pre-interventional antegrade distal flow (TIMI 0). Pre-stent angioplasty was performed. A drug-eluting stent was placed. Post-stent angioplasty was performed. Maximum pressure: 14 atm. The post-interventional distal flow is  decreased (TIMI 2). The intervention was successful. No complications occurred at this lesion. The first diagonal is 100% occluded with angiographic appearance of an acute occlusion. This fits with the patient&#39;s lateral wall MI. He has been given unfractionated heparin and angiomax is used for anticoagulation. He is loaded with Effient 60 mg. An XB LAD 3.5 cm guide catheter was utilized. A cougar wire is used to cross the lesion. The lesion is predilated with a 2.5 mm balloon, then stented with a 3.0 x 38 mm Xience DES. The stent is postdilated with a 3.25 mm noncompliant balloon. Intracoronary verapamil was administered because of poor flow following PCI. At the completion of the procedure, there is TIMI 2 flow in the  vessel and 0% residual stenosis. There is a 0% residual stenosis post intervention.   STRESS TESTS  MYOCARDIAL PERFUSION IMAGING 12/17/2020  Narrative  Nuclear stress EF: 22%.  The left ventricular ejection fraction is severely decreased (<30%).  There was no ST segment deviation noted during stress.  Defect 1: There is a large defect of severe severity present in the basal inferolateral, mid anterior, mid anteroseptal, mid inferolateral, apical anterior, apical septal and apex location.  Findings consistent with prior myocardial infarction.  This is a high risk study.  Abnormal, high risk stress nuclear study with distal septal, apical and inferior lateral infarct.  There is no ischemia noted.  Gated ejection fraction 22% with global hypokinesis and akinesis of the distal anteroseptal wall and apex.  Severe left ventricular enlargement.  Study interpreted as high risk due to reduced LV function.   ECHOCARDIOGRAM  ECHOCARDIOGRAM COMPLETE 01/24/2021  Narrative ECHOCARDIOGRAM REPORT    Patient Name:   Nicolas Ray   Date of Exam: 01/24/2021 Medical Rec #:  [id number]     Height:       68.0 in Accession #:    [id number]    Weight:       208.0 lb Date of Birth:  [birth year]       BSA:          2.078 m Patient Age:    69 years      BP:           162/80 mmHg Patient Gender: M             HR:           68 bpm. Exam Location:  [address]  Procedure: 2D Echo, Cardiac Doppler and Color Doppler  Indications:    I25.10 CAD Native Vessel  History:        Patient has prior history of Echocardiogram examinations, most recent 12/31/2015. Previous Myocardial Infarction and CAD; Risk Factors:Hypertension and Diabetes. Low back pain. Hypothyroidism.  Sonographer:    Cathie Beams RCS Referring Phys: [id number] Samiel Peel  IMPRESSIONS   1. Left ventricular ejection fraction, by estimation, is 35 to 40%. The left ventricle has moderately decreased function. The left ventricle demonstrates global hypokinesis. Left ventricular diastolic parameters are consistent with Grade I diastolic dysfunction (impaired relaxation). Elevated left ventricular end-diastolic pressure. There is akinesis of the left ventricular, apical segment. There is akinesis of the left ventricular, apical septal wall. 2. Right ventricular systolic function is normal. The right ventricular size is normal. 3. The mitral valve is normal in structure. Trivial mitral valve regurgitation. No evidence of mitral stenosis. 4. The aortic valve has an indeterminant number of cusps. There is moderate calcification of the aortic valve. Aortic valve regurgitation is not visualized. No aortic stenosis is present. 5. The inferior vena cava is normal in size with greater than 50% respiratory variability, suggesting right atrial pressure of 3 mmHg.  FINDINGS Left Ventricle: Left ventricular ejection fraction, by estimation, is 35 to 40%. The left ventricle has moderately decreased function. The left ventricle demonstrates global hypokinesis. The left ventricular internal cavity size was normal in size. There is no left ventricular hypertrophy. Left ventricular diastolic parameters are consistent with Grade I diastolic  dysfunction (impaired relaxation). Elevated left ventricular end-diastolic pressure.  Right Ventricle: The right ventricular size is normal. No increase in right ventricular wall thickness. Right ventricular systolic function is normal.  Left Atrium: Left atrial size was normal in size.  Right Atrium: Right atrial size was  normal in size.  Pericardium: There is no evidence of pericardial effusion.  Mitral Valve: The mitral valve is normal in structure. Trivial mitral valve regurgitation. No evidence of mitral valve stenosis.  Tricuspid Valve: The tricuspid valve is normal in structure. Tricuspid valve regurgitation is not demonstrated. No evidence of tricuspid stenosis.  Aortic Valve: The aortic valve has an indeterminant number of cusps. There is moderate calcification of the aortic valve. Aortic valve regurgitation is not visualized. No aortic stenosis is present. Aortic valve mean gradient measures 6.0 mmHg. Aortic valve peak gradient measures 10.9 mmHg. Aortic valve area, by VTI measures 1.79 cm.  Pulmonic Valve: The pulmonic valve was normal in structure. Pulmonic valve regurgitation is not visualized. No evidence of pulmonic stenosis.  Aorta: The aortic root is normal in size and structure.  Venous: The inferior vena cava is normal in size with greater than 50% respiratory variability, suggesting right atrial pressure of 3 mmHg.  IAS/Shunts: No atrial level shunt detected by color flow Doppler.   LEFT VENTRICLE PLAX 2D LVIDd:         5.00 cm  Diastology LVIDs:         3.70 cm  LV e' medial:    4.80 cm/s LV PW:         1.10 cm  LV E/e' medial:  22.5 LV IVS:        1.20 cm  LV e' lateral:   4.73 cm/s LVOT diam:     2.20 cm  LV E/e' lateral: 22.8 LV SV:         67 LV SV Index:   32 LVOT Area:     3.80 cm   RIGHT VENTRICLE RV Basal diam:  2.60 cm RV S prime:     8.48 cm/s  LEFT ATRIUM             Index       RIGHT ATRIUM           Index LA diam:        3.40 cm 1.64  cm/m  RA Area:     13.30 cm LA Vol (A2C):   47.3 ml 22.76 ml/m RA Volume:   28.40 ml  13.67 ml/m LA Vol (A4C):   50.1 ml 24.11 ml/m LA Biplane Vol: 49.0 ml 23.58 ml/m AORTIC VALVE AV Area (Vmax):    1.57 cm AV Area (Vmean):   1.42 cm AV Area (VTI):     1.79 cm AV Vmax:           165.00 cm/s AV Vmean:          117.000 cm/s AV VTI:            0.374 m AV Peak Grad:      10.9 mmHg AV Mean Grad:      6.0 mmHg LVOT Vmax:         68.10 cm/s LVOT Vmean:        43.600 cm/s LVOT VTI:          0.176 m LVOT/AV VTI ratio: 0.47  AORTA Ao Root diam: 3.40 cm  MITRAL VALVE MV Area (PHT): 3.31 cm     SHUNTS MV Decel Time: 229 msec     Systemic VTI:  0.18 m MV E velocity: 108.00 cm/s  Systemic Diam: 2.20 cm MV A velocity: 127.00 cm/s MV E/A ratio:  0.85  Armanda Magicraci Turner MD Electronically signed by Armanda Magicraci Turner MD Signature Date/Time: 01/24/2021/4:14:50 PM    Final  EKG:  EKG is ordered today.  The ekg ordered today demonstrates normal sinus rhythm 72 bpm, age-indeterminate anterolateral infarct, ST and T wave abnormality consider inferolateral ischemia  Recent Labs: 11/01/2022: ALT 15 12/02/2022: BUN 46; Creatinine, Ser 2.34; Hemoglobin 13.6; Platelets 184; Potassium 4.5; Sodium 138  Recent Lipid Panel    Component Value Date/Time   CHOL 125 08/08/2021 1141   CHOL 106 06/29/2019 0953   TRIG 125.0 08/08/2021 1141   HDL 38.40 (L) 08/08/2021 1141   HDL 36 (L) 06/29/2019 0953   CHOLHDL 3 08/08/2021 1141   VLDL 25.0 08/08/2021 1141   LDLCALC 61 08/08/2021 1141   LDLCALC 50 06/29/2019 0953     Risk Assessment/Calculations:            Physical Exam:    VS:  BP (!) 130/90   Pulse 72   Ht 5\' 8"  (1.727 m)   Wt 200 lb 9.6 oz (91 kg)   SpO2 95%   BMI 30.50 kg/m     Wt Readings from Last 3 Encounters:  12/02/22 200 lb 9.6 oz (91 kg)  11/01/22 201 lb 1 oz (91.2 kg)  07/20/22 201 lb 1 oz (91.2 kg)     GEN:  Well nourished, well developed in no acute  distress HEENT: Normal NECK: JVP mildly elevated; No carotid bruits LYMPHATICS: No lymphadenopathy CARDIAC: RRR, no murmurs, rubs, gallops RESPIRATORY:  Clear to auscultation without rales, wheezing or rhonchi  ABDOMEN: Soft, non-tender, non-distended MUSCULOSKELETAL: Trace bilateral pretibial edema with chronic stasis dermatitis; No deformity  SKIN: Warm and dry NEUROLOGIC:  Alert and oriented x 3 PSYCHIATRIC:  Normal affect   ASSESSMENT:    1. Acute on chronic combined systolic and diastolic CHF (congestive heart failure)   2. Coronary artery disease involving native coronary artery of native heart with other form of angina pectoris   3. Smoking   4. Stage 3b chronic kidney disease (CKD)   5. Mixed hyperlipidemia   6. Type II diabetes mellitus with complication    PLAN:    In order of problems listed above:  The patient has progressive LV dysfunction with signs of restrictive physiology on echo.  His symptoms and recent hospitalization raise concern for worsening heart failure.  I think progressive ischemic heart disease is the most likely etiology of his worsening LV dysfunction.  Difficult situation in this patient with longstanding medication noncompliance and also significant comorbidities including stage IIIb chronic kidney disease with recent progression based on his creatinine regardless of this, I think right and left heart catheterization is important to evaluate for worsening multivessel CAD, evaluate his hemodynamics, and directing further treatment. I have reviewed the risks, indications, and alternatives to cardiac catheterization, possible angioplasty, and stenting with the patient. Risks include but are not limited to bleeding, infection, vascular injury, stroke, myocardial infection, arrhythmia, kidney injury, radiation-related injury in the case of prolonged fluoroscopy use, emergency cardiac surgery, and death. The patient understands the risks of serious complication is  1-2 in 1000 with diagnostic cardiac cath and 1-2% or less with angioplasty/stenting.  He will be brought in early for precath hydration.  Further plans based on his cardiac catheterization findings.  If he requires PCI, it will likely have to be staged because of his chronic kidney disease. No clear angina.  Multivessel CAD previously noted in this patient with prior MI.  Plan cardiac catheterization as above.  Continue clopidogrel, metoprolol succinate, and rosuvastatin. Cessation counseling done, not ready to quit Recent creatinine is 2.34.  Unfortunately  I think his kidney disease is bad enough that he will not tolerate ACE/ARB/Entresto/spironolactone.  Avoid nephrotoxins and caution with upcoming contrast administration. Treated with a low-dose statin drug based on patient tolerance.  Most recent labs with an LDL of 61 mg/dL. Managed by primary care.      Shared Decision Making/Informed Consent The risks [stroke (1 in 1000), death (1 in 1000), kidney failure [usually temporary] (1 in 500), bleeding (1 in 200), allergic reaction [possibly serious] (1 in 200)], benefits (diagnostic support and management of coronary artery disease) and alternatives of a cardiac catheterization were discussed in detail with Mr. Corkill and he is willing to proceed.    Medication Adjustments/Labs and Tests Ordered: Current medicines are reviewed at length with the patient today.  Concerns regarding medicines are outlined above.  Orders Placed This Encounter  Procedures   CBC   Basic metabolic panel   EKG 12-Lead   EKG 12-Lead   Meds ordered this encounter  Medications   furosemide (LASIX) 20 MG tablet    Sig: Take 3 tablets (60 mg total) by mouth daily.    Dispense:  270 tablet    Refill:  3    Patient Instructions  Medication Instructions:  Your physician recommends that you continue on your current medications as directed. Please refer to the Current Medication list given to you today.  *If you need  a refill on your cardiac medications before your next appointment, please call your pharmacy*   Lab Work: CBC, BMET today If you have labs (blood work) drawn today and your tests are completely normal, you will receive your results only by: MyChart Message (if you have MyChart) OR A paper copy in the mail If you have any lab test that is abnormal or we need to change your treatment, we will call you to review the results.   Testing/Procedures: R & L heart catheterization Your physician has requested that you have a cardiac catheterization. Cardiac catheterization is used to diagnose and/or treat various heart conditions. Doctors may recommend this procedure for a number of different reasons. The most common reason is to evaluate chest pain. Chest pain can be a symptom of coronary artery disease (CAD), and cardiac catheterization can show whether plaque is narrowing or blocking your heart's arteries. This procedure is also used to evaluate the valves, as well as measure the blood flow and oxygen levels in different parts of your heart. For further information please visit [email or web address] Please follow instruction sheet, as given.  Follow-Up: At [facility], you and your health needs are our priority.  As part of our continuing mission to provide you with exceptional heart care, we have created designated Provider Care Teams.  These Care Teams include your primary Cardiologist (physician) and Advanced Practice Providers (APPs -  Physician Assistants and Nurse Practitioners) who all work together to provide you with the care you need, when you need it.  We recommend signing up for the patient portal called "MyChart".  Sign up information is provided on this After Visit Summary.  MyChart is used to connect with patients for Virtual Visits (Telemedicine).  Patients are able to view lab/test results, encounter notes, upcoming appointments, etc.  Non-urgent messages can be sent to your  provider as well.   To learn more about what you can do with MyChart, go to [email or web address].    Your next appointment:   3 month(s)  Provider:   Robin Searing, NP or Tereso Newcomer, PA-C .Then, Nicolas Bollman, MD will  plan to see you again in 1 year(s).     Other Instructions       Cardiac/Peripheral Catheterization   You are scheduled for a Cardiac Catheterization on Tuesday, April 9 with Dr. Tonny Ray.  1. Please arrive at the Main Entrance A at [facility]: [address] [place], [place] [address] on April 9 at 6:00 AM (This time is four hours before your procedure to ensure your preparation). Free valet parking service is available. You will check in at ADMITTING. The support person will be asked to wait in the waiting room.  It is OK to have someone drop you off and come back when you are ready to be discharged.        Special note: Every effort is made to have your procedure done on time. Please understand that emergencies sometimes delay scheduled procedures.   . 2. Diet: Do not eat solid foods after midnight.  You may have clear liquids until 5 AM the day of the procedure.  3. Labs: Today  4. Medication instructions in preparation for your procedure:   Contrast Allergy: No  DO NOT TAKE Furosemide the morning of procedure  On the morning of your procedure, take Aspirin 81 mg and Plavix/Clopidogrel and any morning medicines NOT listed above.  You may use sips of water.  5. Plan to go home the same day, you will only stay overnight if medically necessary. 6. You MUST have a responsible adult to drive you home. 7. An adult MUST be with you the first 24 hours after you arrive home. 8. Bring a current list of your medications, and the last time and date medication taken. 9. Bring ID and current insurance cards. 10.Please wear clothes that are easy to get on and off and wear slip-on shoes.  Thank you for allowing [name] to care for you!   -- [facility]  Invasive Cardiovascular services    Signed, Nicolas Bollman, MD  12/08/2022 6:09 PM    [facility]

## 2022-12-02 NOTE — H&P (View-Only) (Signed)
Cardiology Office Note:    Date:  12/08/2022   ID:  [name], DOB 1951/07/23, MRN [id number]  PCP:  Loyola Mast, MD   [facility] Providers Cardiologist:  None     Referring MD: Loyola Mast, MD   Chief Complaint  Patient presents with   Shortness of Breath    History of Present Illness:    Nicolas Ray is a 72 y.o. male with a hx of: Coronary artery disease  S/p Lat STEMI 8/16 >> s/p DES to D1 Patient refused CABG >> staged PCI: DES to mid LAD and DES to prox RCA Chronic systolic CHF Ischemic CM ACEi DCd in past due to worsening Creatinine  Echocardiogram 8/16: EF 40-45 Echocardiogram 5/17: EF 35-40 Diabetes mellitus  Chronic kidney disease 3 Hypertension  Medical non-adherence  Tobacco use R hemidiaphragm paralysis Mild OSA  The patient is here alone today.  He has a complex history related to multiple medication intolerances and medication noncompliance.  He was recently driving his car when he developed acute shortness of breath and he stopped to call EMS.  He was taken to [facility].  I do not have all of the notes, but I have been able to review an echocardiogram which demonstrated very severe LV systolic dysfunction with LVEF of 15% as well as grade 3 diastolic dysfunction with restrictive physiology.  His chest x-ray is reviewed and showed pulmonary edema.  He was treated with BiPAP and diuresis.  The best I can tell from my discussion with him today, he signed out AMA the day after he was admitted.  He states that he was feeling fine and did not need to stay in the hospital any longer.  He does complain of some ongoing fatigue.  He has no chest pain or pressure.  He denies orthopnea or PND.  Has had some leg swelling but none at present.  Past Medical History:  Diagnosis Date   Acute MI, lateral wall, initial episode of care 04/18/2015   CAD (coronary artery disease) 04/18/2015   a. Lat STEMI 8/16 - LHC:  pLAD 90, mLAD 50, D1  100, dLCx 90, OM1 80, pRCA 75, EF 35-45% >> DES to D1;  b. staged PCI 04/20/15 - DES to mid LAD and DES to prox RCA   Depression    DM2 (diabetes mellitus, type 2)    HTN (hypertension) 02/08/2013   Renal Artery [name] 9/16: Bilateral RAS 1-59%, SMA >70%   Hypothyroidism    Ischemic cardiomyopathy    a. Echo 8/16:  Mild LVH, EF 40-45%, mid-apical anterolateral and apical AK, grade 1 diastolic dysfunction, trivial effusion   OSA (obstructive sleep apnea) 09/21/2018   ST elevation myocardial infarction (STEMI) of lateral wall 04/18/2015    Past Surgical History:  Procedure Laterality Date   CARDIAC CATHETERIZATION N/A 04/18/2015   Procedure: Left Heart Cath and Coronary Angiography;  Surgeon: Tonny Bollman, MD;  Location: [facility] INVASIVE CV LAB;  Service: Cardiovascular;  Laterality: N/A;   CARDIAC CATHETERIZATION N/A 04/18/2015   Procedure: Coronary Stent Intervention;  Surgeon: Tonny Bollman, MD;  Location: [facility] INVASIVE CV LAB;  Service: Cardiovascular;  Laterality: N/A;   CARDIAC CATHETERIZATION N/A 04/20/2015   Procedure: Coronary Stent Intervention;  Surgeon: Kathleene Hazel, MD;  Location: [facility] INVASIVE CV LAB;  Service: Cardiovascular;  Laterality: N/A;   CORONARY STENT PLACEMENT      Current Medications: Current Meds  Medication Sig   Cholecalciferol (VITAMIN D3 PO) Take  1 capsule by mouth in the morning.   COENZYME Q10 PO Take 1 capsule by mouth in the morning.   furosemide (LASIX) 20 MG tablet Take 3 tablets (60 mg total) by mouth daily. (Patient taking differently: Take 20 mg by mouth in the morning.)   gabapentin (NEURONTIN) 300 MG capsule TAKE 1 CAPSULE(300 MG) BY MOUTH AT BEDTIME   levothyroxine (SYNTHROID) 150 MCG tablet TAKE 1 TABLET(150 MCG) BY MOUTH DAILY BEFORE BREAKFAST   metoprolol succinate (TOPROL-XL) 25 MG 24 hr tablet Take 25 mg by mouth in the morning.   rosuvastatin (CRESTOR) 10 MG tablet TAKE 1 TABLET(10 MG) BY MOUTH DAILY   [DISCONTINUED] aspirin EC 81 MG tablet  Take 1 tablet by mouth daily. Maybe once a month   [DISCONTINUED] carvedilol (COREG) 12.5 MG tablet TAKE 1 TABLET(12.5 MG) BY MOUTH TWICE DAILY   [DISCONTINUED] clopidogrel (PLAVIX) 75 MG tablet TAKE 1 TABLET(75 MG) BY MOUTH DAILY   [DISCONTINUED] furosemide (LASIX) 20 MG tablet Take by mouth daily.   [DISCONTINUED] glipiZIDE (GLUCOTROL) 5 MG tablet Take 1 tablet (5 mg total) by mouth 2 (two) times daily before a meal.   [DISCONTINUED] hydrocortisone (ANUSOL-HC) 2.5 % rectal cream Place 1 Application rectally 2 (two) times daily.   [DISCONTINUED] pramoxine (PROCTOFOAM) 1 % foam Place 1 Application rectally 3 (three) times daily as needed for anal itching.     Allergies:   Patient has no known allergies.   Social History   Socioeconomic History   Marital status: Divorced    Spouse name: Not on file   Number of children: 3   Years of education: Not on file   Highest education level: Not on file  Occupational History   Occupation: Retired- Airline pilot  Tobacco Use   Smoking status: Light Smoker    Packs/day: .25    Types: Cigarettes   Smokeless tobacco: Never   Tobacco comments:    1-3 cigarettes per day 12.4.19  Vaping Use   Vaping Use: Never used  Substance and Sexual Activity   Alcohol use: Yes   Drug use: No   Sexual activity: Not on file  Other Topics Concern   Not on file  Social History Narrative   Right Handed    Lives in a ranch style home with a basement.    Social Determinants of Health   Financial Resource Strain: Not on file  Food Insecurity: Not on file  Transportation Needs: Not on file  Physical Activity: Not on file  Stress: Not on file  Social Connections: Not on file     Family History: The patient's family history includes Cancer in his father; Heart disease in his mother; Stroke in his brother; Thyroid disease in his sister. There is no history of Colon cancer or Diabetes.  ROS:   Please see the history of present illness.    All other systems  reviewed and are negative.  EKGs/Labs/Other Studies Reviewed:    The following studies were reviewed today: Cardiac Studies & Procedures   CARDIAC CATHETERIZATION  CARDIAC CATHETERIZATION 04/20/2015  Narrative  Prox RCA lesion, 75% stenosed. A drug-eluting stent was placed. There is a 0% residual stenosis post intervention.  Prox LAD lesion, 90% stenosed. A drug eluting stent was placed. There is a 0% residual stenosis post intervention.  A drug-eluting stent was placed.  Recommendations: Continue ASA, Brilinta, statin, beta blocker, Ace-inh. Will continue NTG drip post stent placement given mild chest pressure. EKG post PCI with no ST changes.  Findings Coronary Findings Diagnostic  Dominance: Right  Left Main The vessel is angiographically normal.  Left Anterior Descending diffuse . diffuse .  Diffuse severe LAD stenosis just beyond the origin of the first diagonal  First Diagonal Branch diffuse thrombotic with heavy thrombus .  Previously placed 1st Diag drug eluting stent is patent.  Left Circumflex discrete .  First Obtuse Marginal Branch diffuse .  Right Coronary Artery eccentric .  Ulcerated-appearing plaque  Intervention  Prox LAD lesion PCI (Also treats lesions: Mid LAD) The pre-interventional distal flow is normal (TIMI 3). Pre-stent angioplasty was performed. A drug-eluting stent was placed. The strut is apposed. Post-stent angioplasty was performed. The post-interventional distal flow is normal (TIMI 3). The intervention was successful. No complications occurred at this lesion. There is a 0% residual stenosis post intervention.  Mid LAD lesion PCI (Also treats lesions: Prox LAD) See details in Prox LAD lesion. There is a 0% residual stenosis post intervention.  Prox RCA lesion PCI The pre-interventional distal flow is normal (TIMI 3). Pre-stent angioplasty was performed. A drug-eluting stent was placed. The strut is apposed. Post-stent angioplasty  was performed. The post-interventional distal flow is normal (TIMI 3). The intervention was successful. No complications occurred at this lesion. There is a 0% residual stenosis post intervention.   CARDIAC CATHETERIZATION 04/18/2015  Narrative 1. Prox LAD lesion, 90% stenosed. 2. Mid LAD lesion, 50% stenosed. 3. 1st Mrg lesion, 80% stenosed. 4. Dist Cx lesion, 90% stenosed. 5. Prox RCA lesion, 75% stenosed. 6. There is moderate left ventricular systolic dysfunction. 7. 1st Diag lesion, 100% stenosed. There is a 0% residual stenosis post intervention. 8. A drug-eluting stent was placed.  Final conclusions:  Total occlusion of the first diagonal treated successfully with primary PCI using a long DES platform  Severe multivessel CAD with severe LAD stenosis, severe OM and distal LCx stenosis, and moderately severe ulcerated plaque in the proximal RCA  Moderate segmental LV systolic dysfunction c/w an acute lateral wall (diagonal) infarct  Recommendations: Need to consider revascularization options (multivessel stenting versus CABG). The patient appears to be noncompliant based on history and current interaction. Will review with colleagues and discuss options further with patient.  Findings Coronary Findings Diagnostic  Dominance: Right  Left Main The vessel is angiographically normal.  Left Anterior Descending diffuse . diffuse .  Diffuse severe LAD stenosis just beyond the origin of the first diagonal  First Diagonal Branch diffuse thrombotic with heavy thrombus .  Left Circumflex discrete .  First Obtuse Marginal Branch diffuse .  Right Coronary Artery eccentric .  Ulcerated-appearing plaque  Intervention  1st Diag lesion PCI There is no pre-interventional antegrade distal flow (TIMI 0). Pre-stent angioplasty was performed. A drug-eluting stent was placed. Post-stent angioplasty was performed. Maximum pressure: 14 atm. The post-interventional distal flow is  decreased (TIMI 2). The intervention was successful. No complications occurred at this lesion. The first diagonal is 100% occluded with angiographic appearance of an acute occlusion. This fits with the patient&#39;s lateral wall MI. He has been given unfractionated heparin and angiomax is used for anticoagulation. He is loaded with Effient 60 mg. An XB LAD 3.5 cm guide catheter was utilized. A cougar wire is used to cross the lesion. The lesion is predilated with a 2.5 mm balloon, then stented with a 3.0 x 38 mm Xience DES. The stent is postdilated with a 3.25 mm noncompliant balloon. Intracoronary verapamil was administered because of poor flow following PCI. At the completion of the procedure, there is TIMI 2 flow in the  vessel and 0% residual stenosis. There is a 0% residual stenosis post intervention.   STRESS TESTS  MYOCARDIAL PERFUSION IMAGING 12/17/2020  Narrative  Nuclear stress EF: 22%.  The left ventricular ejection fraction is severely decreased (<30%).  There was no ST segment deviation noted during stress.  Defect 1: There is a large defect of severe severity present in the basal inferolateral, mid anterior, mid anteroseptal, mid inferolateral, apical anterior, apical septal and apex location.  Findings consistent with prior myocardial infarction.  This is a high risk study.  Abnormal, high risk stress nuclear study with distal septal, apical and inferior lateral infarct.  There is no ischemia noted.  Gated ejection fraction 22% with global hypokinesis and akinesis of the distal anteroseptal wall and apex.  Severe left ventricular enlargement.  Study interpreted as high risk due to reduced LV function.   ECHOCARDIOGRAM  ECHOCARDIOGRAM COMPLETE 01/24/2021  Narrative ECHOCARDIOGRAM REPORT    Patient Name:   Nicolas Ray   Date of Exam: 01/24/2021 Medical Rec #:  [id number]     Height:       68.0 in Accession #:    [id number]    Weight:       208.0 lb Date of Birth:  Mar 17, 1951       BSA:          2.078 m Patient Age:    69 years      BP:           162/80 mmHg Patient Gender: M             HR:           68 bpm. Exam Location:  [address]  Procedure: 2D Echo, Cardiac Doppler and Color Doppler  Indications:    I25.10 CAD Native Vessel  History:        Patient has prior history of Echocardiogram examinations, most recent 12/31/2015. Previous Myocardial Infarction and CAD; Risk Factors:Hypertension and Diabetes. Low back pain. Hypothyroidism.  Sonographer:    Cathie Beams RCS Referring Phys: [phone] Karlen Barbar  IMPRESSIONS   1. Left ventricular ejection fraction, by estimation, is 35 to 40%. The left ventricle has moderately decreased function. The left ventricle demonstrates global hypokinesis. Left ventricular diastolic parameters are consistent with Grade I diastolic dysfunction (impaired relaxation). Elevated left ventricular end-diastolic pressure. There is akinesis of the left ventricular, apical segment. There is akinesis of the left ventricular, apical septal wall. 2. Right ventricular systolic function is normal. The right ventricular size is normal. 3. The mitral valve is normal in structure. Trivial mitral valve regurgitation. No evidence of mitral stenosis. 4. The aortic valve has an indeterminant number of cusps. There is moderate calcification of the aortic valve. Aortic valve regurgitation is not visualized. No aortic stenosis is present. 5. The inferior vena cava is normal in size with greater than 50% respiratory variability, suggesting right atrial pressure of 3 mmHg.  FINDINGS Left Ventricle: Left ventricular ejection fraction, by estimation, is 35 to 40%. The left ventricle has moderately decreased function. The left ventricle demonstrates global hypokinesis. The left ventricular internal cavity size was normal in size. There is no left ventricular hypertrophy. Left ventricular diastolic parameters are consistent with Grade I diastolic  dysfunction (impaired relaxation). Elevated left ventricular end-diastolic pressure.  Right Ventricle: The right ventricular size is normal. No increase in right ventricular wall thickness. Right ventricular systolic function is normal.  Left Atrium: Left atrial size was normal in size.  Right Atrium: Right atrial size was  normal in size.  Pericardium: There is no evidence of pericardial effusion.  Mitral Valve: The mitral valve is normal in structure. Trivial mitral valve regurgitation. No evidence of mitral valve stenosis.  Tricuspid Valve: The tricuspid valve is normal in structure. Tricuspid valve regurgitation is not demonstrated. No evidence of tricuspid stenosis.  Aortic Valve: The aortic valve has an indeterminant number of cusps. There is moderate calcification of the aortic valve. Aortic valve regurgitation is not visualized. No aortic stenosis is present. Aortic valve mean gradient measures 6.0 mmHg. Aortic valve peak gradient measures 10.9 mmHg. Aortic valve area, by VTI measures 1.79 cm.  Pulmonic Valve: The pulmonic valve was normal in structure. Pulmonic valve regurgitation is not visualized. No evidence of pulmonic stenosis.  Aorta: The aortic root is normal in size and structure.  Venous: The inferior vena cava is normal in size with greater than 50% respiratory variability, suggesting right atrial pressure of 3 mmHg.  IAS/Shunts: No atrial level shunt detected by color flow Doppler.   LEFT VENTRICLE PLAX 2D LVIDd:         5.00 cm  Diastology LVIDs:         3.70 cm  LV e' medial:    4.80 cm/s LV PW:         1.10 cm  LV E/e' medial:  22.5 LV IVS:        1.20 cm  LV e' lateral:   4.73 cm/s LVOT diam:     2.20 cm  LV E/e' lateral: 22.8 LV SV:         67 LV SV Index:   32 LVOT Area:     3.80 cm   RIGHT VENTRICLE RV Basal diam:  2.60 cm RV S prime:     8.48 cm/s  LEFT ATRIUM             Index       RIGHT ATRIUM           Index LA diam:        3.40 cm 1.64  cm/m  RA Area:     13.30 cm LA Vol (A2C):   47.3 ml 22.76 ml/m RA Volume:   28.40 ml  13.67 ml/m LA Vol (A4C):   50.1 ml 24.11 ml/m LA Biplane Vol: 49.0 ml 23.58 ml/m AORTIC VALVE AV Area (Vmax):    1.57 cm AV Area (Vmean):   1.42 cm AV Area (VTI):     1.79 cm AV Vmax:           165.00 cm/s AV Vmean:          117.000 cm/s AV VTI:            0.374 m AV Peak Grad:      10.9 mmHg AV Mean Grad:      6.0 mmHg LVOT Vmax:         68.10 cm/s LVOT Vmean:        43.600 cm/s LVOT VTI:          0.176 m LVOT/AV VTI ratio: 0.47  AORTA Ao Root diam: 3.40 cm  MITRAL VALVE MV Area (PHT): 3.31 cm     SHUNTS MV Decel Time: 229 msec     Systemic VTI:  0.18 m MV E velocity: 108.00 cm/s  Systemic Diam: 2.20 cm MV A velocity: 127.00 cm/s MV E/A ratio:  0.85  Armanda Magicraci Turner MD Electronically signed by Armanda Magicraci Turner MD Signature Date/Time: 01/24/2021/4:14:50 PM    Final  EKG:  EKG is ordered today.  The ekg ordered today demonstrates normal sinus rhythm 72 bpm, age-indeterminate anterolateral infarct, ST and T wave abnormality consider inferolateral ischemia  Recent Labs: 11/01/2022: ALT 15 12/02/2022: BUN 46; Creatinine, Ser 2.34; Hemoglobin 13.6; Platelets 184; Potassium 4.5; Sodium 138  Recent Lipid Panel    Component Value Date/Time   CHOL 125 08/08/2021 1141   CHOL 106 06/29/2019 0953   TRIG 125.0 08/08/2021 1141   HDL 38.40 (L) 08/08/2021 1141   HDL 36 (L) 06/29/2019 0953   CHOLHDL 3 08/08/2021 1141   VLDL 25.0 08/08/2021 1141   LDLCALC 61 08/08/2021 1141   LDLCALC 50 06/29/2019 0953     Risk Assessment/Calculations:            Physical Exam:    VS:  BP (!) 130/90   Pulse 72   Ht 5\' 8"  (1.727 m)   Wt 200 lb 9.6 oz (91 kg)   SpO2 95%   BMI 30.50 kg/m     Wt Readings from Last 3 Encounters:  12/02/22 200 lb 9.6 oz (91 kg)  11/01/22 201 lb 1 oz (91.2 kg)  07/20/22 201 lb 1 oz (91.2 kg)     GEN:  Well nourished, well developed in no acute  distress HEENT: Normal NECK: JVP mildly elevated; No carotid bruits LYMPHATICS: No lymphadenopathy CARDIAC: RRR, no murmurs, rubs, gallops RESPIRATORY:  Clear to auscultation without rales, wheezing or rhonchi  ABDOMEN: Soft, non-tender, non-distended MUSCULOSKELETAL: Trace bilateral pretibial edema with chronic stasis dermatitis; No deformity  SKIN: Warm and dry NEUROLOGIC:  Alert and oriented x 3 PSYCHIATRIC:  Normal affect   ASSESSMENT:    1. Acute on chronic combined systolic and diastolic CHF (congestive heart failure)   2. Coronary artery disease involving native coronary artery of native heart with other form of angina pectoris   3. Smoking   4. Stage 3b chronic kidney disease (CKD)   5. Mixed hyperlipidemia   6. Type II diabetes mellitus with complication    PLAN:    In order of problems listed above:  The patient has progressive LV dysfunction with signs of restrictive physiology on echo.  His symptoms and recent hospitalization raise concern for worsening heart failure.  I think progressive ischemic heart disease is the most likely etiology of his worsening LV dysfunction.  Difficult situation in this patient with longstanding medication noncompliance and also significant comorbidities including stage IIIb chronic kidney disease with recent progression based on his creatinine regardless of this, I think right and left heart catheterization is important to evaluate for worsening multivessel CAD, evaluate his hemodynamics, and directing further treatment. I have reviewed the risks, indications, and alternatives to cardiac catheterization, possible angioplasty, and stenting with the patient. Risks include but are not limited to bleeding, infection, vascular injury, stroke, myocardial infection, arrhythmia, kidney injury, radiation-related injury in the case of prolonged fluoroscopy use, emergency cardiac surgery, and death. The patient understands the risks of serious complication is  1-2 in 1000 with diagnostic cardiac cath and 1-2% or less with angioplasty/stenting.  He will be brought in early for precath hydration.  Further plans based on his cardiac catheterization findings.  If he requires PCI, it will likely have to be staged because of his chronic kidney disease. No clear angina.  Multivessel CAD previously noted in this patient with prior MI.  Plan cardiac catheterization as above.  Continue clopidogrel, metoprolol succinate, and rosuvastatin. Cessation counseling done, not ready to quit Recent creatinine is 2.34.  Unfortunately  I think his kidney disease is bad enough that he will not tolerate ACE/ARB/Entresto/spironolactone.  Avoid nephrotoxins and caution with upcoming contrast administration. Treated with a low-dose statin drug based on patient tolerance.  Most recent labs with an LDL of 61 mg/dL. Managed by primary care.      Shared Decision Making/Informed Consent The risks [stroke (1 in 1000), death (1 in 1000), kidney failure [usually temporary] (1 in 500), bleeding (1 in 200), allergic reaction [possibly serious] (1 in 200)], benefits (diagnostic support and management of coronary artery disease) and alternatives of a cardiac catheterization were discussed in detail with Mr. Corkill and he is willing to proceed.    Medication Adjustments/Labs and Tests Ordered: Current medicines are reviewed at length with the patient today.  Concerns regarding medicines are outlined above.  Orders Placed This Encounter  Procedures   CBC   Basic metabolic panel   EKG 12-Lead   EKG 12-Lead   Meds ordered this encounter  Medications   furosemide (LASIX) 20 MG tablet    Sig: Take 3 tablets (60 mg total) by mouth daily.    Dispense:  270 tablet    Refill:  3    Patient Instructions  Medication Instructions:  Your physician recommends that you continue on your current medications as directed. Please refer to the Current Medication list given to you today.  *If you need  a refill on your cardiac medications before your next appointment, please call your pharmacy*   Lab Work: CBC, BMET today If you have labs (blood work) drawn today and your tests are completely normal, you will receive your results only by: MyChart Message (if you have MyChart) OR A paper copy in the mail If you have any lab test that is abnormal or we need to change your treatment, we will call you to review the results.   Testing/Procedures: R & L heart catheterization Your physician has requested that you have a cardiac catheterization. Cardiac catheterization is used to diagnose and/or treat various heart conditions. Doctors may recommend this procedure for a number of different reasons. The most common reason is to evaluate chest pain. Chest pain can be a symptom of coronary artery disease (CAD), and cardiac catheterization can show whether plaque is narrowing or blocking your heart's arteries. This procedure is also used to evaluate the valves, as well as measure the blood flow and oxygen levels in different parts of your heart. For further information please visit [email or web address] Please follow instruction sheet, as given.  Follow-Up: At [facility], you and your health needs are our priority.  As part of our continuing mission to provide you with exceptional heart care, we have created designated Provider Care Teams.  These Care Teams include your primary Cardiologist (physician) and Advanced Practice Providers (APPs -  Physician Assistants and Nurse Practitioners) who all work together to provide you with the care you need, when you need it.  We recommend signing up for the patient portal called "MyChart".  Sign up information is provided on this After Visit Summary.  MyChart is used to connect with patients for Virtual Visits (Telemedicine).  Patients are able to view lab/test results, encounter notes, upcoming appointments, etc.  Non-urgent messages can be sent to your  provider as well.   To learn more about what you can do with MyChart, go to [email or web address].    Your next appointment:   3 month(s)  Provider:   Robin Searing, NP or Tereso Newcomer, PA-C .Then, Tonny Bollman, MD will  plan to see you again in 1 year(s).     Other Instructions       Cardiac/Peripheral Catheterization   You are scheduled for a Cardiac Catheterization on Tuesday, April 9 with Dr. Tonny Bollman.  1. Please arrive at the Main Entrance A at [facility]: [address] [place], [place] [address] on April 9 at 6:00 AM (This time is four hours before your procedure to ensure your preparation). Free valet parking service is available. You will check in at ADMITTING. The support person will be asked to wait in the waiting room.  It is OK to have someone drop you off and come back when you are ready to be discharged.        Special note: Every effort is made to have your procedure done on time. Please understand that emergencies sometimes delay scheduled procedures.   . 2. Diet: Do not eat solid foods after midnight.  You may have clear liquids until 5 AM the day of the procedure.  3. Labs: Today  4. Medication instructions in preparation for your procedure:   Contrast Allergy: No  DO NOT TAKE Furosemide the morning of procedure  On the morning of your procedure, take Aspirin 81 mg and Plavix/Clopidogrel and any morning medicines NOT listed above.  You may use sips of water.  5. Plan to go home the same day, you will only stay overnight if medically necessary. 6. You MUST have a responsible adult to drive you home. 7. An adult MUST be with you the first 24 hours after you arrive home. 8. Bring a current list of your medications, and the last time and date medication taken. 9. Bring ID and current insurance cards. 10.Please wear clothes that are easy to get on and off and wear slip-on shoes.  Thank you for allowing [name] to care for you!   -- [facility]  Invasive Cardiovascular services    Signed, Tonny Bollman, MD  12/08/2022 6:09 PM    [facility]

## 2022-12-02 NOTE — Patient Instructions (Signed)
Medication Instructions:  Your physician recommends that you continue on your current medications as directed. Please refer to the Current Medication list given to you today.  *If you need a refill on your cardiac medications before your next appointment, please call your pharmacy*   Lab Work: CBC, BMET today If you have labs (blood work) drawn today and your tests are completely normal, you will receive your results only by: [place] (if you have MyChart) OR A paper copy in the mail If you have any lab test that is abnormal or we need to change your treatment, we will call you to review the results.   Testing/Procedures: R & L heart catheterization Your physician has requested that you have a cardiac catheterization. Cardiac catheterization is used to diagnose and/or treat various heart conditions. Doctors may recommend this procedure for a number of different reasons. The most common reason is to evaluate chest pain. Chest pain can be a symptom of coronary artery disease (CAD), and cardiac catheterization can show whether plaque is narrowing or blocking your heart's arteries. This procedure is also used to evaluate the valves, as well as measure the blood flow and oxygen levels in different parts of your heart. For further information please visit [email or web address]. Please follow instruction sheet, as given.  Follow-Up: At [facility], you and your health needs are our priority.  As part of our continuing mission to provide you with exceptional heart care, we have created designated Provider Care Teams.  These Care Teams include your primary Cardiologist (physician) and Advanced Practice Providers (APPs -  Physician Assistants and Nurse Practitioners) who all work together to provide you with the care you need, when you need it.  We recommend signing up for the patient portal called "MyChart".  Sign up information is provided on this After Visit Summary.  MyChart is used to  connect with patients for Virtual Visits (Telemedicine).  Patients are able to view lab/test results, encounter notes, upcoming appointments, etc.  Non-urgent messages can be sent to your provider as well.   To learn more about what you can do with MyChart, go to [email or web address].    Your next appointment:   3 month(s)  Provider:   Ambrose Pancoast, NP or Richardson Dopp, PA-C .Then, Sherren Mocha, MD will plan to see you again in 1 year(s).     Other Instructions       Cardiac/Peripheral Catheterization   You are scheduled for a Cardiac Catheterization on Tuesday, April 9 with Dr. Sherren Mocha.  1. Please arrive at the Main Entrance A at [facility]: [place], [place] [address] on April 9 at 6:00 AM (This time is four hours before your procedure to ensure your preparation). Free valet parking service is available. You will check in at ADMITTING. The support person will be asked to wait in the waiting room.  It is OK to have someone drop you off and come back when you are ready to be discharged.        Special note: Every effort is made to have your procedure done on time. Please understand that emergencies sometimes delay scheduled procedures.   . 2. Diet: Do not eat solid foods after midnight.  You may have clear liquids until 5 AM the day of the procedure.  3. Labs: Today  4. Medication instructions in preparation for your procedure:   Contrast Allergy: No  DO NOT TAKE Furosemide the morning of procedure  On the morning of your  procedure, take Aspirin 81 mg and Plavix/Clopidogrel and any morning medicines NOT listed above.  You may use sips of water.  5. Plan to go home the same day, you will only stay overnight if medically necessary. 6. You MUST have a responsible adult to drive you home. 7. An adult MUST be with you the first 24 hours after you arrive home. 8. Bring a current list of your medications, and the last time and date medication  taken. 9. Bring ID and current insurance cards. 10.Please wear clothes that are easy to get on and off and wear slip-on shoes.  Thank you for allowing [name] to care for you!   -- [facility]

## 2022-12-06 ENCOUNTER — Other Ambulatory Visit: Payer: Self-pay | Admitting: Cardiovascular Disease

## 2022-12-08 ENCOUNTER — Encounter: Payer: Self-pay | Admitting: Cardiovascular Disease

## 2022-12-08 ENCOUNTER — Telehealth: Payer: Self-pay | Admitting: *Deleted

## 2022-12-08 ENCOUNTER — Other Ambulatory Visit: Payer: Self-pay | Admitting: *Deleted

## 2022-12-08 NOTE — Telephone Encounter (Signed)
Rx refill sent to pharmacy. 

## 2022-12-08 NOTE — Telephone Encounter (Signed)
Cardiac Catheterization scheduled at [facility] for: Tuesday December 09, 2022 10:30 AM Arrival time [facility] Main Entrance A at: 6 AM-pre-procedure hydration  Nothing to eat after midnight prior to procedure, clear liquids until 5 AM day of procedure.  Medication instructions: -Hold:  Lasix-day before and day of procedure per protocol GFR 29-pt may have already taken today -Other usual morning medications can be taken with sips of water including aspirin 81 mg and Plavix 75 mg.  Confirmed patient has responsible adult to drive home post procedure and be with patient first 24 hours after arriving home.  Plan to go home the same day, you will only stay overnight if medically necessary.  Reviewed procedure instructions, pre-procedure hydration with patient.

## 2022-12-09 ENCOUNTER — Ambulatory Visit (HOSPITAL_COMMUNITY)
Admission: RE | Admit: 2022-12-09 | Discharge: 2022-12-09 | Disposition: A | Discharge status: Home / Self Care | Payer: 59 | Attending: Cardiovascular Disease | Admitting: Cardiovascular Disease

## 2022-12-09 ENCOUNTER — Encounter (HOSPITAL_COMMUNITY): Payer: Self-pay | Admitting: Cardiovascular Disease

## 2022-12-09 ENCOUNTER — Encounter (HOSPITAL_COMMUNITY)
Admission: RE | Disposition: A | Discharge status: Home / Self Care | Payer: Self-pay | Source: Home / Self Care | Attending: Cardiovascular Disease

## 2022-12-09 DIAGNOSIS — N1832 Chronic kidney disease, stage 3b: Secondary | ICD-10-CM | POA: Diagnosis not present

## 2022-12-09 DIAGNOSIS — Z955 Presence of coronary angioplasty implant and graft: Secondary | ICD-10-CM | POA: Diagnosis not present

## 2022-12-09 DIAGNOSIS — Z79899 Other long term (current) drug therapy: Secondary | ICD-10-CM | POA: Insufficient documentation

## 2022-12-09 DIAGNOSIS — E1122 Type 2 diabetes mellitus with diabetic chronic kidney disease: Secondary | ICD-10-CM | POA: Diagnosis not present

## 2022-12-09 DIAGNOSIS — E782 Mixed hyperlipidemia: Secondary | ICD-10-CM | POA: Insufficient documentation

## 2022-12-09 DIAGNOSIS — I252 Old myocardial infarction: Secondary | ICD-10-CM | POA: Insufficient documentation

## 2022-12-09 DIAGNOSIS — I25118 Atherosclerotic heart disease of native coronary artery with other forms of angina pectoris: Secondary | ICD-10-CM | POA: Diagnosis not present

## 2022-12-09 DIAGNOSIS — I5043 Acute on chronic combined systolic (congestive) and diastolic (congestive) heart failure: Secondary | ICD-10-CM | POA: Insufficient documentation

## 2022-12-09 DIAGNOSIS — F17210 Nicotine dependence, cigarettes, uncomplicated: Secondary | ICD-10-CM | POA: Diagnosis not present

## 2022-12-09 DIAGNOSIS — Z7902 Long term (current) use of antithrombotics/antiplatelets: Secondary | ICD-10-CM | POA: Insufficient documentation

## 2022-12-09 DIAGNOSIS — I2582 Chronic total occlusion of coronary artery: Secondary | ICD-10-CM | POA: Insufficient documentation

## 2022-12-09 DIAGNOSIS — I5020 Unspecified systolic (congestive) heart failure: Secondary | ICD-10-CM

## 2022-12-09 DIAGNOSIS — I2510 Atherosclerotic heart disease of native coronary artery without angina pectoris: Secondary | ICD-10-CM

## 2022-12-09 DIAGNOSIS — I130 Hypertensive heart and chronic kidney disease with heart failure and stage 1 through stage 4 chronic kidney disease, or unspecified chronic kidney disease: Secondary | ICD-10-CM | POA: Insufficient documentation

## 2022-12-09 HISTORY — PX: RIGHT/LEFT HEART CATH AND CORONARY ANGIOGRAPHY: CATH118266

## 2022-12-09 LAB — POCT I-STAT EG7
Acid-Base Excess: 0 mmol/L (ref 0.0–2.0)
Bicarbonate: 26.1 mmol/L (ref 20.0–28.0)
Calcium, Ion: 1.11 mmol/L — ABNORMAL LOW (ref 1.15–1.40)
HCT: 45 % (ref 39.0–52.0)
Hemoglobin: 15.3 g/dL (ref 13.0–17.0)
O2 Saturation: 67 %
Potassium: 3.8 mmol/L (ref 3.5–5.1)
Sodium: 139 mmol/L (ref 135–145)
TCO2: 28 mmol/L (ref 22–32)
pCO2, Ven: 47.3 mmHg (ref 44–60)
pH, Ven: 7.349 (ref 7.25–7.43)
pO2, Ven: 37 mmHg (ref 32–45)

## 2022-12-09 LAB — POCT I-STAT 7, (LYTES, BLD GAS, ICA,H+H)
Acid-base deficit: 1 mmol/L (ref 0.0–2.0)
Bicarbonate: 25.3 mmol/L (ref 20.0–28.0)
Calcium, Ion: 1.18 mmol/L (ref 1.15–1.40)
HCT: 46 % (ref 39.0–52.0)
Hemoglobin: 15.6 g/dL (ref 13.0–17.0)
O2 Saturation: 87 %
Potassium: 3.9 mmol/L (ref 3.5–5.1)
Sodium: 138 mmol/L (ref 135–145)
TCO2: 27 mmol/L (ref 22–32)
pCO2 arterial: 44.8 mmHg (ref 32–48)
pH, Arterial: 7.36 (ref 7.35–7.45)
pO2, Arterial: 56 mmHg — ABNORMAL LOW (ref 83–108)

## 2022-12-09 LAB — GLUCOSE, CAPILLARY
Glucose-Capillary: 136 mg/dL — ABNORMAL HIGH (ref 70–99)
Glucose-Capillary: 110 mg/dL — ABNORMAL HIGH (ref 70–99)

## 2022-12-09 SURGERY — RIGHT/LEFT HEART CATH AND CORONARY ANGIOGRAPHY
Anesthesia: LOCAL

## 2022-12-09 MED ORDER — HEPARIN (PORCINE) IN NACL 1000-0.9 UT/500ML-% IV SOLN
INTRAVENOUS | Status: DC | PRN
  Administered 2022-12-09 (×2): 500 mL

## 2022-12-09 MED ORDER — SODIUM CHLORIDE 0.9% FLUSH: 3.0000 mL | INTRAVENOUS | Status: DC | PRN

## 2022-12-09 MED ORDER — SODIUM CHLORIDE 0.9 % WEIGHT BASED INFUSION
3.0000 mL/kg/h | INTRAVENOUS | Status: AC
  Administered 2022-12-09: 3 mL/kg/h via INTRAVENOUS

## 2022-12-09 MED ORDER — ACETAMINOPHEN 325 MG PO TABS: 650.0000 mg | ORAL_TABLET | ORAL | Status: DC | PRN

## 2022-12-09 MED ORDER — MIDAZOLAM HCL 2 MG/2ML IJ SOLN
INTRAMUSCULAR | Status: DC | PRN
  Administered 2022-12-09 (×2): 1 mg via INTRAVENOUS

## 2022-12-09 MED ORDER — HEPARIN SODIUM (PORCINE) 1000 UNIT/ML IJ SOLN
INTRAMUSCULAR | Status: AC
  Filled 2022-12-09: qty 10

## 2022-12-09 MED ORDER — SODIUM CHLORIDE 0.9% FLUSH: 3.0000 mL | Freq: Two times a day (BID) | INTRAVENOUS | Status: DC

## 2022-12-09 MED ORDER — LABETALOL HCL 5 MG/ML IV SOLN: 10.0000 mg | INTRAVENOUS | Status: DC | PRN

## 2022-12-09 MED ORDER — FENTANYL CITRATE (PF) 100 MCG/2ML IJ SOLN
INTRAMUSCULAR | Status: DC | PRN
  Administered 2022-12-09 (×2): 25 ug via INTRAVENOUS

## 2022-12-09 MED ORDER — HEPARIN SODIUM (PORCINE) 1000 UNIT/ML IJ SOLN
INTRAMUSCULAR | Status: DC | PRN
  Administered 2022-12-09: 5000 [IU] via INTRAVENOUS

## 2022-12-09 MED ORDER — HYDRALAZINE HCL 20 MG/ML IJ SOLN: 10.0000 mg | INTRAMUSCULAR | Status: DC | PRN

## 2022-12-09 MED ORDER — ASPIRIN 81 MG PO CHEW: 81.0000 mg | CHEWABLE_TABLET | ORAL | Status: DC

## 2022-12-09 MED ORDER — SODIUM CHLORIDE 0.9 % WEIGHT BASED INFUSION
1.0000 mL/kg/h | INTRAVENOUS | Status: DC
  Administered 2022-12-09: 1 mL/kg/h via INTRAVENOUS

## 2022-12-09 MED ORDER — CLOPIDOGREL BISULFATE 75 MG PO TABS: 75.0000 mg | ORAL_TABLET | ORAL | Status: DC

## 2022-12-09 MED ORDER — VERAPAMIL HCL 2.5 MG/ML IV SOLN
INTRAVENOUS | Status: DC | PRN
  Administered 2022-12-09: 10 mL via INTRA_ARTERIAL

## 2022-12-09 MED ORDER — LIDOCAINE HCL (PF) 1 % IJ SOLN
INTRAMUSCULAR | Status: DC | PRN
  Administered 2022-12-09 (×2): 5 mL via INTRADERMAL

## 2022-12-09 MED ORDER — ONDANSETRON HCL 4 MG/2ML IJ SOLN: 4.0000 mg | Freq: Four times a day (QID) | INTRAMUSCULAR | Status: DC | PRN

## 2022-12-09 MED ORDER — SODIUM CHLORIDE 0.9 % IV SOLN: 250.0000 mL | INTRAVENOUS | Status: DC | PRN

## 2022-12-09 MED ORDER — IOHEXOL 350 MG/ML SOLN
INTRAVENOUS | Status: DC | PRN
  Administered 2022-12-09: 30 mL

## 2022-12-09 MED ORDER — MIDAZOLAM HCL 2 MG/2ML IJ SOLN
INTRAMUSCULAR | Status: AC
  Filled 2022-12-09: qty 2

## 2022-12-09 MED ORDER — FENTANYL CITRATE (PF) 100 MCG/2ML IJ SOLN
INTRAMUSCULAR | Status: AC
  Filled 2022-12-09: qty 2

## 2022-12-09 MED ORDER — VERAPAMIL HCL 2.5 MG/ML IV SOLN
INTRAVENOUS | Status: AC
  Filled 2022-12-09: qty 2

## 2022-12-09 MED ORDER — LIDOCAINE HCL (PF) 1 % IJ SOLN
INTRAMUSCULAR | Status: AC
  Filled 2022-12-09: qty 30

## 2022-12-09 SURGICAL SUPPLY — 11 items
CATH 5FR JL3.5 JR4 ANG PIG MP (CATHETERS) IMPLANT
CATH BALLN WEDGE 5F 110CM (CATHETERS) IMPLANT
DEVICE RAD COMP TR BAND LRG (VASCULAR PRODUCTS) IMPLANT
GLIDESHEATH SLEND SS 6F .021 (SHEATH) IMPLANT
GUIDEWIRE INQWIRE 1.5J.035X260 (WIRE) IMPLANT
INQWIRE 1.5J .035X260CM (WIRE) ×1
KIT HEART LEFT (KITS) ×1 IMPLANT
PACK CARDIAC CATHETERIZATION (CUSTOM PROCEDURE TRAY) ×1 IMPLANT
SHEATH GLIDE SLENDER 4/5FR (SHEATH) IMPLANT
TRANSDUCER W/STOPCOCK (MISCELLANEOUS) ×1 IMPLANT
TUBING CIL FLEX 10 FLL-RA (TUBING) ×1 IMPLANT

## 2022-12-09 NOTE — Interval H&P Note (Signed)
History and Physical Interval Note:  12/09/2022 10:46 AM  Nicolas Ray  has presented today for surgery, with the diagnosis of cad - angina.  The various methods of treatment have been discussed with the patient and family. After consideration of risks, benefits and other options for treatment, the patient has consented to  Procedure(s): RIGHT/LEFT HEART CATH AND CORONARY ANGIOGRAPHY (N/A) as a surgical intervention.  The patient's history has been reviewed, patient examined, no change in status, stable for surgery.  I have reviewed the patient's chart and labs.  Questions were answered to the patient's satisfaction.     Tonny Bollman

## 2022-12-09 NOTE — Progress Notes (Signed)
TR BAND REMOVAL  LOCATION:    right radial  DEFLATED PER PROTOCOL:    Yes.    TIME BAND OFF / DRESSING APPLIED: 12/09/22 at 1335   SITE UPON ARRIVAL:    Level 0  SITE AFTER BAND REMOVAL:    Level 0  CIRCULATION SENSATION AND MOVEMENT:    Within Normal Limits   Yes.    COMMENTS:

## 2022-12-10 ENCOUNTER — Telehealth: Payer: Self-pay

## 2022-12-10 NOTE — Transitions of Care (Post Inpatient/ED Visit) (Signed)
   12/10/2022  Name: ROBEY DEBOY MRN: [id number] DOB: 17-Dec-1950  Today's TOC FU Call Status: Today's TOC FU Call Status:: Unsuccessul Call (1st Attempt) Unsuccessful Call (1st Attempt) Date: 12/10/22  Attempted to reach the patient regarding the most recent Inpatient/ED visit.  Follow Up Plan: Additional outreach attempts will be made to reach the patient to complete the Transitions of Care (Post Inpatient/ED visit) call.   Signature Arvil Persons, BSN, Charity fundraiser

## 2022-12-11 NOTE — Transitions of Care (Post Inpatient/ED Visit) (Signed)
   12/11/2022  Name: Nicolas Ray MRN: [id number] DOB: 19-Dec-1950  Today's TOC FU Call Status: Today's TOC FU Call Status:: Unsuccessful Call (2nd Attempt) Unsuccessful Call (1st Attempt) Date: 12/10/22 Unsuccessful Call (2nd Attempt) Date: 12/11/22  Attempted to reach the patient regarding the most recent Inpatient/ED visit.  Follow Up Plan: Additional outreach attempts will be made to reach the patient to complete the Transitions of Care (Post Inpatient/ED visit) call.   Signature Arvil Persons, BSN, Charity fundraiser

## 2022-12-15 ENCOUNTER — Other Ambulatory Visit: Payer: Self-pay | Admitting: Cardiovascular Disease

## 2022-12-25 NOTE — Transitions of Care (Post Inpatient/ED Visit) (Signed)
   12/25/2022  Name: Nicolas Ray MRN: [id number] DOB: 1951/07/22  Today's TOC FU Call Status: Today's TOC FU Call Status:: Unsuccessful Call (2nd Attempt) Unsuccessful Call (1st Attempt) Date: 12/10/22 Unsuccessful Call (2nd Attempt) Date: 12/11/22  Attempted to reach the patient regarding the most recent Inpatient/ED visit.  Follow Up Plan: No further outreach attempts will be made at this time. We have been unable to contact the patient.  Signature Arvil Persons, BSN, Charity fundraiser

## 2022-12-29 ENCOUNTER — Ambulatory Visit: Payer: 59 | Admitting: Cardiovascular Disease

## 2023-01-06 ENCOUNTER — Other Ambulatory Visit: Payer: Self-pay | Admitting: Family Medicine

## 2023-01-06 DIAGNOSIS — F5101 Primary insomnia: Secondary | ICD-10-CM

## 2023-01-06 NOTE — Telephone Encounter (Signed)
Refill request for  Gabapentin 300 mg LR 09/30/22, #90. 0 rf LOV 01/15/22 FOV  none scheduled.    Please review and advise.  Thanks. Dm/cma

## 2023-01-08 ENCOUNTER — Other Ambulatory Visit: Payer: Self-pay | Admitting: Family Medicine

## 2023-01-08 DIAGNOSIS — E038 Other specified hypothyroidism: Secondary | ICD-10-CM

## 2023-01-13 NOTE — Telephone Encounter (Signed)
Somehow Received e-fax for this pt requesting Levothyroxine 0.150 MG tabs to be refilled at [facility] in [place], [place].   Lov: 01/15/2022

## 2023-01-14 ENCOUNTER — Telehealth: Payer: Self-pay | Admitting: Cardiovascular Disease

## 2023-01-14 NOTE — Telephone Encounter (Signed)
Pt c/o medication issue:  1. Name of Medication:   metoprolol succinate (TOPROL-XL) 25 MG 24 hr tablet  furosemide (LASIX) 20 MG tablet  levothyroxine (SYNTHROID) 150 MCG tablet  clopidogrel (PLAVIX) 75 MG tablet  rosuvastatin (CRESTOR) 10 MG tablet  aspirin EC 81 MG tablet  gabapentin (NEURONTIN) 300 MG capsule   2. How are you currently taking this medication (dosage and times per day)?  As prescribed  3. Are you having a reaction (difficulty breathing--STAT)?   No  4. What is your medication issue?   Patient called back to provide list of his medication which may be causing him to urinate every hour.

## 2023-01-14 NOTE — Telephone Encounter (Signed)
Pt would like a callback regarding discussing his medications. Stated one of them are causing him to urinate every hour. He couldn't provide the name of the medications. Please advise.

## 2023-01-14 NOTE — Telephone Encounter (Signed)
Returned call to patient to inform that Furosemide is the culprit for increased urination. He states he is taking 40mg  daily (our chart shows he reported 20mg  daily at his last visit). He says he cannot tolerate running to the bathroom so much. Educated on his heart failure and the importance of maintaining fluid balance. He states he will think about it, but will cut to one tablet (20mg  daily) if he needs to.

## 2023-01-22 ENCOUNTER — Telehealth: Payer: Self-pay

## 2023-01-22 NOTE — Telephone Encounter (Signed)
Left patient a detailed voice message to return call to office if he would like to be Scheduled for our [facility]abetic [facility] on 01/29/2023.

## 2023-01-23 NOTE — Telephone Encounter (Signed)
Lft VM to rtn call to schedule appointment. Dm/cma  

## 2023-01-28 NOTE — Telephone Encounter (Signed)
Lft VM to rtn call to schedule appointment. Dm/cma  

## 2023-02-04 NOTE — Telephone Encounter (Signed)
Lft detailed VM to RTN call to schedule a f/u appointment for further refills. Dm/cma

## 2023-02-06 NOTE — Telephone Encounter (Signed)
Lft detailed VM to RTN call to schedule a f/u appointment for further refills. Dm/cma  

## 2023-02-10 ENCOUNTER — Telehealth: Payer: Self-pay

## 2023-02-10 DIAGNOSIS — E1140 Type 2 diabetes mellitus with diabetic neuropathy, unspecified: Secondary | ICD-10-CM

## 2023-02-10 DIAGNOSIS — I5020 Unspecified systolic (congestive) heart failure: Secondary | ICD-10-CM

## 2023-02-10 NOTE — Telephone Encounter (Signed)
PharmD reviewed patient chart to assess eligibility for [facility] services. Patient was determined to be a good candidate for the program given the complexity of the medication regimen and/or overall risk for hospitalization and increased utilization.  Referral entered in order to outreach patient and offer appointment with PharmD. Referral cosigned to PCP.  

## 2023-03-10 ENCOUNTER — Ambulatory Visit: Payer: 59 | Admitting: Physician Assistant

## 2023-03-12 ENCOUNTER — Ambulatory Visit: Payer: 59 | Attending: Physician Assistant | Admitting: Cardiovascular Disease

## 2023-03-12 ENCOUNTER — Encounter: Payer: Self-pay | Admitting: Cardiovascular Disease

## 2023-03-12 VITALS — BP 130/90 | HR 67 | Ht 68.0 in | Wt 200.0 lb

## 2023-03-12 DIAGNOSIS — M79671 Pain in right foot: Secondary | ICD-10-CM

## 2023-03-12 DIAGNOSIS — E782 Mixed hyperlipidemia: Secondary | ICD-10-CM | POA: Diagnosis not present

## 2023-03-12 DIAGNOSIS — I2510 Atherosclerotic heart disease of native coronary artery without angina pectoris: Secondary | ICD-10-CM | POA: Diagnosis not present

## 2023-03-12 DIAGNOSIS — I5022 Chronic systolic (congestive) heart failure: Secondary | ICD-10-CM

## 2023-03-12 MED ORDER — NITROGLYCERIN 0.4 MG SL SUBL
SUBLINGUAL_TABLET | SUBLINGUAL | 5 refills | Status: DC
Start: 2023-03-12 — End: 2023-10-02

## 2023-03-12 NOTE — Patient Instructions (Signed)
Medication Instructions:  Your physician recommends that you continue on your current medications as directed. Please refer to the Current Medication list given to you today.  *If you need a refill on your cardiac medications before your next appointment, please call your pharmacy*   Lab Work: CBC, BMET today If you have labs (blood work) drawn today and your tests are completely normal, you will receive your results only by: MyChart Message (if you have MyChart) OR A paper copy in the mail If you have any lab test that is abnormal or we need to change your treatment, we will call you to review the results.   Testing/Procedures: Ambulatory referral to [facility] (R foot pain)   Follow-Up: At [facility], you and your health needs are our priority.  As part of our continuing mission to provide you with exceptional heart care, we have created designated Provider Care Teams.  These Care Teams include your primary Cardiologist (physician) and Advanced Practice Providers (APPs -  Physician Assistants and Nurse Practitioners) who all work together to provide you with the care you need, when you need it.  We recommend signing up for the patient portal called "MyChart".  Sign up information is provided on this After Visit Summary.  MyChart is used to connect with patients for Virtual Visits (Telemedicine).  Patients are able to view lab/test results, encounter notes, upcoming appointments, etc.  Non-urgent messages can be sent to your provider as well.   To learn more about what you can do with MyChart, go to [email or web address].    Your next appointment:   6 month(s)  Provider:   Tonny Bollman, MD

## 2023-03-12 NOTE — Progress Notes (Signed)
Cardiology Office Note:    Date:  03/12/2023   ID:  Nicolas Ray, DOB 07/22/1951, MRN [id number]  PCP:  Loyola Mast, MD   [facility] Providers Cardiologist:  None     Referring MD: Loyola Mast, MD   Chief Complaint  Patient presents with   Congestive Heart Failure    History of Present Illness:    Nicolas Ray is a 72 y.o. male with a hx of:  Coronary artery disease  S/p Lat STEMI 8/16 >> s/p DES to D1 Patient refused CABG >> staged PCI: DES to mid LAD and DES to prox RCA Chronic systolic CHF Ischemic CM ACEi DCd in past due to worsening Creatinine  Echocardiogram 8/16: EF 40-45 Echocardiogram 5/17: EF 35-40 Acute pulmonary edema March 2024 Diabetes mellitus  Chronic kidney disease 3 Hypertension  Medical non-adherence  Tobacco use R hemidiaphragm paralysis Mild OSA  The patient is here alone today.  He has been doing okay from a cardiac perspective.  Actually states he has not had any problems recently.  Denies chest pain, shortness of breath, orthopnea, or PND.  No heart palpitations.  He had a pain in his foot and requests a referral for evaluation.  He is able to walk without too much difficulty.  No claudication symptoms.  He has not had any problems with his breathing since that isolated event occurred earlier this year when he was admitted with acute pulmonary edema.  He was not tolerating 60 mg of furosemide and has decreased his dose to 40 mg daily.  He feels like he is tolerating this well.  Past Medical History:  Diagnosis Date   Acute MI, lateral wall, initial episode of care (HCC) 04/18/2015   CAD (coronary artery disease) 04/18/2015   a. Lat STEMI 8/16 - LHC:  pLAD 90, mLAD 50, D1 100, dLCx 90, OM1 80, pRCA 75, EF 35-45% >> DES to D1;  b. staged PCI 04/20/15 - DES to mid LAD and DES to prox RCA   Depression    DM2 (diabetes mellitus, type 2) (HCC)    HTN (hypertension) 02/08/2013   Renal Artery [name] 9/16: Bilateral RAS 1-59%, SMA >70%    Hypothyroidism    Ischemic cardiomyopathy    a. Echo 8/16:  Mild LVH, EF 40-45%, mid-apical anterolateral and apical AK, grade 1 diastolic dysfunction, trivial effusion   OSA (obstructive sleep apnea) 09/21/2018   ST elevation myocardial infarction (STEMI) of lateral wall (HCC) 04/18/2015    Past Surgical History:  Procedure Laterality Date   CARDIAC CATHETERIZATION N/A 04/18/2015   Procedure: Left Heart Cath and Coronary Angiography;  Surgeon: Tonny Bollman, MD;  Location: [facility] INVASIVE CV LAB;  Service: Cardiovascular;  Laterality: N/A;   CARDIAC CATHETERIZATION N/A 04/18/2015   Procedure: Coronary Stent Intervention;  Surgeon: Tonny Bollman, MD;  Location: [facility] INVASIVE CV LAB;  Service: Cardiovascular;  Laterality: N/A;   CARDIAC CATHETERIZATION N/A 04/20/2015   Procedure: Coronary Stent Intervention;  Surgeon: Kathleene Hazel, MD;  Location: [facility] INVASIVE CV LAB;  Service: Cardiovascular;  Laterality: N/A;   CORONARY STENT PLACEMENT     RIGHT/LEFT HEART CATH AND CORONARY ANGIOGRAPHY N/A 12/09/2022   Procedure: RIGHT/LEFT HEART CATH AND CORONARY ANGIOGRAPHY;  Surgeon: Tonny Bollman, MD;  Location: [facility] INVASIVE CV LAB;  Service: Cardiovascular;  Laterality: N/A;    Current Medications: Current Meds  Medication Sig   aspirin EC 81 MG tablet Take 81 mg by mouth every other day. In the morning. Swallow whole.  Cholecalciferol (VITAMIN D3 PO) Take 1 capsule by mouth in the morning.   clopidogrel (PLAVIX) 75 MG tablet TAKE 1 TABLET(75 MG) BY MOUTH DAILY   COENZYME Q10 PO Take 1 capsule by mouth in the morning. Per patient taking 200 mg daily   furosemide (LASIX) 20 MG tablet Take 3 tablets (60 mg total) by mouth daily. (Patient taking differently: Take 20 mg by mouth in the morning. Per patient taking 1/2 tablet (10 mg) of the 20 mg tablet daily)   gabapentin (NEURONTIN) 300 MG capsule TAKE 1 CAPSULE(300 MG) BY MOUTH AT BEDTIME   levothyroxine (SYNTHROID) 150 MCG tablet TAKE 1 TABLET(150  MCG) BY MOUTH DAILY BEFORE BREAKFAST   metoprolol succinate (TOPROL-XL) 25 MG 24 hr tablet Take 25 mg by mouth in the morning.   rosuvastatin (CRESTOR) 10 MG tablet TAKE 1 TABLET(10 MG) BY MOUTH DAILY     Allergies:   Patient has no known allergies.   Social History   Socioeconomic History   Marital status: Divorced    Spouse name: Not on file   Number of children: 3   Years of education: Not on file   Highest education level: Not on file  Occupational History   Occupation: Retired- Airline pilot  Tobacco Use   Smoking status: Light Smoker    Current packs/day: 0.25    Types: Cigarettes   Smokeless tobacco: Never   Tobacco comments:    1-3 cigarettes per day 12.4.19  Vaping Use   Vaping status: Never Used  Substance and Sexual Activity   Alcohol use: Yes   Drug use: No   Sexual activity: Not on file  Other Topics Concern   Not on file  Social History Narrative   Right Handed    Lives in a ranch style home with a basement.    Social Determinants of Health   Financial Resource Strain: Not on file  Food Insecurity: Low Risk  (11/19/2022)   [facility]   Food vital sign    Within the past 12 months, you worried that your food would run out before you got money to buy more: Never true    Within the past 12 months, the food you bought just didn't last and you didn't have money to get more. : Never true  Transportation Needs: No Transportation Needs (11/19/2022)   [facility]   Transportation    In the past 12 months, has lack of reliable transportation kept you from medical appointments, meetings, work or from getting things needed for daily living? : No  Physical Activity: Not on file  Stress: Not on file  Social Connections: Unknown (01/13/2022)   [facility]   Social Network    Social Network: Not on file     Family History: The patient's family history includes Cancer in his father; Heart disease  in his mother; Stroke in his brother; Thyroid disease in his sister. There is no history of Colon cancer or Diabetes.  ROS:   Please see the history of present illness.    All other systems reviewed and are negative.  EKGs/Labs/Other Studies Reviewed:    The following studies were reviewed today: Cardiac Studies & Procedures   CARDIAC CATHETERIZATION  CARDIAC CATHETERIZATION 12/09/2022  Narrative 1.  Continued patency of the left main 2.  Continued patency of the LAD and diagonal stents with mild diffuse in-stent restenosis in both vessels, moderate distal edge restenosis in the LAD and the diagonal  3.  Stable but severe stenosis of a small first OM branch and total occlusion of the mid left circumflex which is now collateralized via left to right collaterals filling the distal OM 4.  Continued patency of the RCA stent with mild diffuse in-stent restenosis and mild to moderate distal RCA and proximal PDA stenoses 5.  Normal LVEDP of 12 mmHg, normal wedge pressure of 10 mmHg, then well compensated hemodynamics with normal right-sided diastolic filling pressures and preserved cardiac output of 7.4 L/min with cardiac index of 3.6 L/min/m  Recommendations: Continued medical therapy  Findings Coronary Findings Diagnostic  Dominance: Right  Left Main There is mild diffuse disease throughout the vessel.  Left Anterior Descending Non-stenotic Prox LAD lesion was previously treated. The lesion is segmental. Mid LAD-1 lesion is 40% stenosed. The lesion is segmental. The lesion was previously treated . Diffuse severe LAD stenosis just beyond the origin of the first diagonal Mid LAD-2 lesion is 60% stenosed.  First Diagonal Branch 1st Diag-1 lesion is 30% stenosed. The lesion is segmental, thrombotic and heavily thrombotic. The lesion was previously treated . 1st Diag-2 lesion is 60% stenosed.  Left Circumflex Collaterals Dist Cx filled by collaterals from 2nd Sept.  Dist Cx lesion is  100% stenosed. The lesion is discrete.  First Obtuse Marginal Branch 1st Mrg lesion is 80% stenosed. The lesion is segmental.  Third Obtuse Marginal Branch Collaterals 3rd Mrg filled by collaterals from 2nd Sept.  Right Coronary Artery Prox RCA lesion is 30% stenosed. The lesion is eccentric. The lesion was previously treated . Ulcerated-appearing plaque Prox RCA to Mid RCA lesion is 40% stenosed. Dist RCA lesion is 40% stenosed.  Right Posterior Descending Artery RPDA lesion is 50% stenosed.  Intervention  No interventions have been documented.   CARDIAC CATHETERIZATION  CARDIAC CATHETERIZATION 04/20/2015  Narrative  Prox RCA lesion, 75% stenosed. A drug-eluting stent was placed. There is a 0% residual stenosis post intervention.  Prox LAD lesion, 90% stenosed. A drug eluting stent was placed. There is a 0% residual stenosis post intervention.  A drug-eluting stent was placed.  Recommendations: Continue ASA, Brilinta, statin, beta blocker, Ace-inh. Will continue NTG drip post stent placement given mild chest pressure. EKG post PCI with no ST changes.  Findings Coronary Findings Diagnostic  Dominance: Right  Left Main The vessel is angiographically normal.  Left Anterior Descending diffuse . diffuse .  Diffuse severe LAD stenosis just beyond the origin of the first diagonal  First Diagonal Branch diffuse thrombotic with heavy thrombus .  Previously placed 1st Diag drug eluting stent is patent.  Left Circumflex discrete .  First Obtuse Marginal Branch diffuse .  Right Coronary Artery eccentric .  Ulcerated-appearing plaque  Intervention  Prox LAD lesion PCI (Also treats lesions: Mid LAD) The pre-interventional distal flow is normal (TIMI 3). Pre-stent angioplasty was performed. A drug-eluting stent was placed. The strut is apposed. Post-stent angioplasty was performed. The post-interventional distal flow is normal (TIMI 3). The intervention was successful.  No complications occurred at this lesion. There is a 0% residual stenosis post intervention.  Mid LAD lesion PCI (Also treats lesions: Prox LAD) See details in Prox LAD lesion. There is a 0% residual stenosis post intervention.  Prox RCA lesion PCI The pre-interventional distal flow is normal (TIMI 3). Pre-stent angioplasty was performed. A drug-eluting stent was placed. The strut is apposed. Post-stent angioplasty was performed. The post-interventional distal flow is normal (TIMI 3). The intervention was successful. No complications occurred at this lesion. There  is a 0% residual stenosis post intervention.   STRESS TESTS  MYOCARDIAL PERFUSION IMAGING 12/17/2020  Narrative  Nuclear stress EF: 22%.  The left ventricular ejection fraction is severely decreased (<30%).  There was no ST segment deviation noted during stress.  Defect 1: There is a large defect of severe severity present in the basal inferolateral, mid anterior, mid anteroseptal, mid inferolateral, apical anterior, apical septal and apex location.  Findings consistent with prior myocardial infarction.  This is a high risk study.  Abnormal, high risk stress nuclear study with distal septal, apical and inferior lateral infarct.  There is no ischemia noted.  Gated ejection fraction 22% with global hypokinesis and akinesis of the distal anteroseptal wall and apex.  Severe left ventricular enlargement.  Study interpreted as high risk due to reduced LV function.   ECHOCARDIOGRAM  ECHOCARDIOGRAM COMPLETE 01/24/2021  Narrative ECHOCARDIOGRAM REPORT    Patient Name:   Nicolas Ray   Date of Exam: 01/24/2021 Medical Rec #:  [id number]     Height:       68.0 in Accession #:    [id number]    Weight:       208.0 lb Date of Birth:  12/28/1950      BSA:          2.078 m Patient Age:    69 years      BP:           162/80 mmHg Patient Gender: M             HR:           68 bpm. Exam Location:  [address]  Procedure: 2D Echo,  Cardiac Doppler and Color Doppler  Indications:    I25.10 CAD Native Vessel  History:        Patient has prior history of Echocardiogram examinations, most recent 12/31/2015. Previous Myocardial Infarction and CAD; Risk Factors:Hypertension and Diabetes. Low back pain. Hypothyroidism.  Sonographer:    Cathie Beams RCS Referring Phys: [phone] Ahjanae Cassel  IMPRESSIONS   1. Left ventricular ejection fraction, by estimation, is 35 to 40%. The left ventricle has moderately decreased function. The left ventricle demonstrates global hypokinesis. Left ventricular diastolic parameters are consistent with Grade I diastolic dysfunction (impaired relaxation). Elevated left ventricular end-diastolic pressure. There is akinesis of the left ventricular, apical segment. There is akinesis of the left ventricular, apical septal wall. 2. Right ventricular systolic function is normal. The right ventricular size is normal. 3. The mitral valve is normal in structure. Trivial mitral valve regurgitation. No evidence of mitral stenosis. 4. The aortic valve has an indeterminant number of cusps. There is moderate calcification of the aortic valve. Aortic valve regurgitation is not visualized. No aortic stenosis is present. 5. The inferior vena cava is normal in size with greater than 50% respiratory variability, suggesting right atrial pressure of 3 mmHg.  FINDINGS Left Ventricle: Left ventricular ejection fraction, by estimation, is 35 to 40%. The left ventricle has moderately decreased function. The left ventricle demonstrates global hypokinesis. The left ventricular internal cavity size was normal in size. There is no left ventricular hypertrophy. Left ventricular diastolic parameters are consistent with Grade I diastolic dysfunction (impaired relaxation). Elevated left ventricular end-diastolic pressure.  Right Ventricle: The right ventricular size is normal. No increase in right ventricular wall thickness. Right  ventricular systolic function is normal.  Left [facility]: Left atrial size was normal in size.  Right [facility]: Right atrial size was normal in size.  Pericardium: There  is no evidence of pericardial effusion.  Mitral Valve: The mitral valve is normal in structure. Trivial mitral valve regurgitation. No evidence of mitral valve stenosis.  Tricuspid Valve: The tricuspid valve is normal in structure. Tricuspid valve regurgitation is not demonstrated. No evidence of tricuspid stenosis.  Aortic Valve: The aortic valve has an indeterminant number of cusps. There is moderate calcification of the aortic valve. Aortic valve regurgitation is not visualized. No aortic stenosis is present. Aortic valve mean gradient measures 6.0 mmHg. Aortic valve peak gradient measures 10.9 mmHg. Aortic valve area, by VTI measures 1.79 cm.  Pulmonic Valve: The pulmonic valve was normal in structure. Pulmonic valve regurgitation is not visualized. No evidence of pulmonic stenosis.  Aorta: The aortic root is normal in size and structure.  Venous: The inferior vena cava is normal in size with greater than 50% respiratory variability, suggesting right atrial pressure of 3 mmHg.  IAS/Shunts: No atrial level shunt detected by color flow Doppler.   LEFT VENTRICLE PLAX 2D LVIDd:         5.00 cm  Diastology LVIDs:         3.70 cm  LV e' medial:    4.80 cm/s LV PW:         1.10 cm  LV E/e' medial:  22.5 LV IVS:        1.20 cm  LV e' lateral:   4.73 cm/s LVOT diam:     2.20 cm  LV E/e' lateral: 22.8 LV SV:         67 LV SV Index:   32 LVOT Area:     3.80 cm   RIGHT VENTRICLE RV Basal diam:  2.60 cm RV S prime:     8.48 cm/s  LEFT ATRIUM             Index       RIGHT ATRIUM           Index LA diam:        3.40 cm 1.64 cm/m  RA Area:     13.30 cm LA Vol (A2C):   47.3 ml 22.76 ml/m RA Volume:   28.40 ml  13.67 ml/m LA Vol (A4C):   50.1 ml 24.11 ml/m LA Biplane Vol: 49.0 ml 23.58 ml/m AORTIC VALVE AV Area  (Vmax):    1.57 cm AV Area (Vmean):   1.42 cm AV Area (VTI):     1.79 cm AV Vmax:           165.00 cm/s AV Vmean:          117.000 cm/s AV VTI:            0.374 m AV Peak Grad:      10.9 mmHg AV Mean Grad:      6.0 mmHg LVOT Vmax:         68.10 cm/s LVOT Vmean:        43.600 cm/s LVOT VTI:          0.176 m LVOT/AV VTI ratio: 0.47  AORTA Ao Root diam: 3.40 cm  MITRAL VALVE MV Area (PHT): 3.31 cm     SHUNTS MV Decel Time: 229 msec     Systemic VTI:  0.18 m MV E velocity: 108.00 cm/s  Systemic Diam: 2.20 cm MV A velocity: 127.00 cm/s MV E/A ratio:  0.85  Armanda Magic MD Electronically signed by Armanda Magic MD Signature Date/Time: 01/24/2021/4:14:50 PM    Final  Recent Labs: 11/01/2022: ALT 15 12/02/2022: BUN 46; Creatinine, Ser 2.34; Platelets 184 12/09/2022: Hemoglobin 15.6; Potassium 3.9; Sodium 138  Recent Lipid Panel    Component Value Date/Time   CHOL 125 08/08/2021 1141   CHOL 106 06/29/2019 0953   TRIG 125.0 08/08/2021 1141   HDL 38.40 (L) 08/08/2021 1141   HDL 36 (L) 06/29/2019 0953   CHOLHDL 3 08/08/2021 1141   VLDL 25.0 08/08/2021 1141   LDLCALC 61 08/08/2021 1141   LDLCALC 50 06/29/2019 0953     Risk Assessment/Calculations:           Physical Exam:    VS:  BP (!) 130/90   Pulse 67   Ht 5\' 8"  (1.727 m)   Wt 200 lb (90.7 kg)   SpO2 95%   BMI 30.41 kg/m     Wt Readings from Last 3 Encounters:  03/12/23 200 lb (90.7 kg)  12/09/22 200 lb (90.7 kg)  12/02/22 200 lb 9.6 oz (91 kg)     GEN:  Well nourished, well developed in no acute distress HEENT: Normal NECK: No JVD; No carotid bruits LYMPHATICS: No lymphadenopathy CARDIAC: RRR, no murmurs, rubs, gallops RESPIRATORY:  Clear to auscultation without rales, wheezing or rhonchi  ABDOMEN: Soft, non-tender, non-distended MUSCULOSKELETAL:  No edema; No deformity  SKIN: Warm and dry NEUROLOGIC:  Alert and oriented x 3 PSYCHIATRIC:  Normal affect   ASSESSMENT:    1.  Right foot pain   2. Coronary artery disease involving native coronary artery of native heart without angina pectoris   3. Mixed hyperlipidemia   4. Chronic systolic heart failure (HCC)    PLAN:    In order of problems listed above:  He requests a referral for further evaluation.  He has seen a neurologist and was dissatisfied with that.  Will send him to [facility] for evaluation. Stable on aspirin, clopidogrel, beta-blockade, and a statin drug.  Recent cardiac catheterization study reviewed with stable findings.  No role for revascularization.  No chest discomfort or anginal symptoms. Treated with rosuvastatin.  LDL cholesterol is 61. Patient on furosemide, metoprolol succinate.  He has a long history of wanting to avoid medications and periods of medication noncompliance.  Patient has CKD stage IIIb.  Will check a creatinine and metabolic panel today.  Will review his labs and make further recommendations for his medications.  He would benefit from [place].  I am not sure that he will be agreeable to take this but we will discuss it.  He has had multiple intolerances and perceived side effects to a variety of medications over the years and it has been very difficult to get him on GDMT.           Medication Adjustments/Labs and Tests Ordered: Current medicines are reviewed at length with the patient today.  Concerns regarding medicines are outlined above.  Orders Placed This Encounter  Procedures   Basic metabolic panel   CBC   Meds ordered this encounter  Medications   nitroGLYCERIN (NITROSTAT) 0.4 MG SL tablet    Sig: Dissolve 1 tablet under the tongue every 5 minutes as needed for chest pain. Max of 3 doses, then 911.    Dispense:  25 tablet    Refill:  5    Patient Instructions  Medication Instructions:  Your physician recommends that you continue on your current medications as directed. Please refer to the Current Medication list given to you today.  *If you  need a refill on your  cardiac medications before your next appointment, please call your pharmacy*   Lab Work: CBC, BMET today If you have labs (blood work) drawn today and your tests are completely normal, you will receive your results only by: MyChart Message (if you have MyChart) OR A paper copy in the mail If you have any lab test that is abnormal or we need to change your treatment, we will call you to review the results.   Testing/Procedures: Ambulatory referral to [facility] (R foot pain)   Follow-Up: At [facility], you and your health needs are our priority.  As part of our continuing mission to provide you with exceptional heart care, we have created designated Provider Care Teams.  These Care Teams include your primary Cardiologist (physician) and Advanced Practice Providers (APPs -  Physician Assistants and Nurse Practitioners) who all work together to provide you with the care you need, when you need it.  We recommend signing up for the patient portal called "MyChart".  Sign up information is provided on this After Visit Summary.  MyChart is used to connect with patients for Virtual Visits (Telemedicine).  Patients are able to view lab/test results, encounter notes, upcoming appointments, etc.  Non-urgent messages can be sent to your provider as well.   To learn more about what you can do with MyChart, go to [email or web address].    Your next appointment:   6 month(s)  Provider:   Tonny Bollman, MD       Signed, Tonny Bollman, MD  03/12/2023 10:42 AM    [facility]

## 2023-03-13 LAB — BASIC METABOLIC PANEL
BUN/Creatinine Ratio: 18 (ref 10–24)
BUN: 40 mg/dL — ABNORMAL HIGH (ref 8–27)
CO2: 20 mmol/L (ref 20–29)
Calcium: 9.2 mg/dL (ref 8.6–10.2)
Chloride: 100 mmol/L (ref 96–106)
Creatinine, Ser: 2.25 mg/dL — ABNORMAL HIGH (ref 0.76–1.27)
Glucose: 108 mg/dL — ABNORMAL HIGH (ref 70–99)
Potassium: 4.6 mmol/L (ref 3.5–5.2)
Sodium: 136 mmol/L (ref 134–144)
eGFR: 30 mL/min/{1.73_m2} — ABNORMAL LOW (ref >59)

## 2023-03-13 LAB — CBC
Hematocrit: 45.9 % (ref 37.5–51.0)
Hemoglobin: 14.7 g/dL (ref 13.0–17.7)
MCH: 21.9 pg — ABNORMAL LOW (ref 26.6–33.0)
MCHC: 32 g/dL (ref 31.5–35.7)
MCV: 68 fL — ABNORMAL LOW (ref 79–97)
Platelets: 184 10*3/uL (ref 150–450)
RBC: 6.71 x10E6/uL — ABNORMAL HIGH (ref 4.14–5.80)
RDW: 16.9 % — ABNORMAL HIGH (ref 11.6–15.4)
WBC: 9.8 10*3/uL (ref 3.4–10.8)

## 2023-04-01 ENCOUNTER — Ambulatory Visit: Payer: 59 | Admitting: Podiatry

## 2023-04-01 DIAGNOSIS — M6289 Other specified disorders of muscle: Secondary | ICD-10-CM

## 2023-04-01 MED ORDER — CYCLOBENZAPRINE HCL 10 MG PO TABS: 10.0000 mg | ORAL_TABLET | Freq: Three times a day (TID) | ORAL | 0 refills | Status: DC | PRN

## 2023-04-01 NOTE — Progress Notes (Signed)
Subjective:  Patient ID: Nicolas Ray, male    DOB: 01-Aug-1951,  MRN: [id number]  Chief Complaint  Patient presents with   Foot Pain    72 y.o. male presents with the above complaint.  Patient presents with complaint of tightness to the anterior ankle.  He states that he been causing some discomfort.  He is a diabetic he has not seen MRIs prior to seeing me.  Feels like something squeezing the anterior portion of the distal ankle.  Pain scale is 5 out of 10 dull aching nature and wanted discuss treatment options.  He feels a tightness.  He has not tried muscle relaxer   Review of Systems: Negative except as noted in the HPI. Denies N/V/F/Ch.  Past Medical History:  Diagnosis Date   Acute MI, lateral wall, initial episode of care (HCC) 04/18/2015   CAD (coronary artery disease) 04/18/2015   a. Lat STEMI 8/16 - LHC:  pLAD 90, mLAD 50, D1 100, dLCx 90, OM1 80, pRCA 75, EF 35-45% >> DES to D1;  b. staged PCI 04/20/15 - DES to mid LAD and DES to prox RCA   Depression    DM2 (diabetes mellitus, type 2) (HCC)    HTN (hypertension) 02/08/2013   Renal Artery [name] 9/16: Bilateral RAS 1-59%, SMA >70%   Hypothyroidism    Ischemic cardiomyopathy    a. Echo 8/16:  Mild LVH, EF 40-45%, mid-apical anterolateral and apical AK, grade 1 diastolic dysfunction, trivial effusion   OSA (obstructive sleep apnea) 09/21/2018   ST elevation myocardial infarction (STEMI) of lateral wall (HCC) 04/18/2015    Current Outpatient Medications:    cyclobenzaprine (FLEXERIL) 10 MG tablet, Take 1 tablet (10 mg total) by mouth 3 (three) times daily as needed for muscle spasms., Disp: 30 tablet, Rfl: 0   aspirin EC 81 MG tablet, Take 81 mg by mouth every other day. In the morning. Swallow whole., Disp: , Rfl:    Cholecalciferol (VITAMIN D3 PO), Take 1 capsule by mouth in the morning., Disp: , Rfl:    clopidogrel (PLAVIX) 75 MG tablet, TAKE 1 TABLET(75 MG) BY MOUTH DAILY, Disp: 90 tablet, Rfl: 3   COENZYME Q10 PO, Take 1 capsule  by mouth in the morning. Per patient taking 200 mg daily, Disp: , Rfl:    furosemide (LASIX) 20 MG tablet, Take 3 tablets (60 mg total) by mouth daily. (Patient taking differently: Take 20 mg by mouth in the morning. Per patient taking 1/2 tablet (10 mg) of the 20 mg tablet daily), Disp: 270 tablet, Rfl: 3   gabapentin (NEURONTIN) 300 MG capsule, TAKE 1 CAPSULE(300 MG) BY MOUTH AT BEDTIME, Disp: 90 capsule, Rfl: 0   levothyroxine (SYNTHROID) 150 MCG tablet, TAKE 1 TABLET(150 MCG) BY MOUTH DAILY BEFORE BREAKFAST, Disp: 30 tablet, Rfl: 0   metoprolol succinate (TOPROL-XL) 25 MG 24 hr tablet, Take 25 mg by mouth in the morning., Disp: , Rfl:    nitroGLYCERIN (NITROSTAT) 0.4 MG SL tablet, Dissolve 1 tablet under the tongue every 5 minutes as needed for chest pain. Max of 3 doses, then 911., Disp: 25 tablet, Rfl: 5   rosuvastatin (CRESTOR) 10 MG tablet, TAKE 1 TABLET(10 MG) BY MOUTH DAILY, Disp: 90 tablet, Rfl: 3  Social History   Tobacco Use  Smoking Status Light Smoker   Current packs/day: 0.25   Types: Cigarettes  Smokeless Tobacco Never  Tobacco Comments   1-3 cigarettes per day 12.4.19    No Known Allergies Objective:  There were no vitals  filed for this visit. There is no height or weight on file to calculate BMI. Constitutional Well developed. Well nourished.  Vascular Dorsalis pedis pulses palpable bilaterally. Posterior tibial pulses palpable bilaterally. Capillary refill normal to all digits.  No cyanosis or clubbing noted. Pedal hair growth normal.  Neurologic Normal speech. Oriented to person, place, and time. Epicritic sensation to light touch grossly present bilaterally.  Dermatologic Nails well groomed and normal in appearance. No open wounds. No skin lesions.  Orthopedic: Good strength noted to the flexor compartment as well as anterior muscle compartment peroneal groove compartment.  No impingement noted negative Tinel's sign noted at the deep peroneal nerve or  superficial peroneal nerve   Radiographs: None Assessment:   1. Muscle tightness    Plan:  Patient was evaluated and treated and all questions answered.  Right side anterior muscle group tightness -All questions and concerns were discussed with the patient in extensive detail -Given the amount of emesis present I believe patient would benefit from muscle relaxer -Muscle relaxer was dispensed. No follow-ups on file.

## 2023-04-02 ENCOUNTER — Other Ambulatory Visit: Payer: Self-pay | Admitting: Family Medicine

## 2023-04-02 DIAGNOSIS — E038 Other specified hypothyroidism: Secondary | ICD-10-CM

## 2023-04-02 NOTE — Telephone Encounter (Signed)
Lft detailed VM to schedule an appointment.  Dm/cma

## 2023-04-08 ENCOUNTER — Telehealth: Payer: Self-pay | Admitting: Cardiovascular Disease

## 2023-04-08 NOTE — Telephone Encounter (Signed)
Pt c/o medication issue:  1. Name of Medication:   furosemide (LASIX) 20 MG tablet    2. How are you currently taking this medication (dosage and times per day)?  Take 3 tablets (60 mg total) by mouth daily.Patient taking differently: Take 20 mg by mouth in the morning. Per patient taking 1/2 tablet (10 mg) of the 20 mg tablet daily       3. Are you having a reaction (difficulty breathing--STAT)? No  4. What is your medication issue? Pt is requesting a callback regarding this medication changing the consistency of him urinating when he takes it and doesn't take it. Please advise

## 2023-04-08 NOTE — Telephone Encounter (Signed)
Spoke with pt who complains of urinary hesitancy with dribbling when taking Furosemide 20mg .  Pt states he tried reducing prescribed dose down to 1 tablet by mouth daily and continues to have this problem.  Pt states he stopped this medication completely 2 days ago and he is urinating normally again.   Pt is requesting that this be discussed directly "with  and only with Dr Excell Seltzer."  Pt advised Dr Excell Seltzer is out of the office for the next several days but will forward to Dr Earmon Phoenix nurse.  Pt verbalizes understanding and agrees with current plan.

## 2023-04-15 ENCOUNTER — Other Ambulatory Visit: Payer: Self-pay | Admitting: Family Medicine

## 2023-04-15 DIAGNOSIS — E038 Other specified hypothyroidism: Secondary | ICD-10-CM

## 2023-04-20 NOTE — Telephone Encounter (Signed)
Spoke with Dr Excell Seltzer in clinic who stated he is fine with patient stopping the Furosemide. Returned call to patient, no answer. Left detailed message per DPR and to call back if needing to discuss further. Medication removed from [facility] at this time.

## 2023-04-24 ENCOUNTER — Other Ambulatory Visit: Payer: Self-pay | Admitting: Family Medicine

## 2023-04-24 DIAGNOSIS — F5101 Primary insomnia: Secondary | ICD-10-CM

## 2023-04-25 ENCOUNTER — Other Ambulatory Visit: Payer: Self-pay | Admitting: Family Medicine

## 2023-04-25 DIAGNOSIS — E038 Other specified hypothyroidism: Secondary | ICD-10-CM

## 2023-04-28 NOTE — Telephone Encounter (Signed)
Lft vm to rtn call to schedule an appt. Dm/cma

## 2023-05-11 ENCOUNTER — Other Ambulatory Visit: Payer: Self-pay | Admitting: Family Medicine

## 2023-05-11 DIAGNOSIS — E038 Other specified hypothyroidism: Secondary | ICD-10-CM

## 2023-05-11 DIAGNOSIS — F5101 Primary insomnia: Secondary | ICD-10-CM

## 2023-05-11 NOTE — Telephone Encounter (Signed)
Lft vm to rtn call to schedule an appt. Dm/cma

## 2023-05-13 ENCOUNTER — Encounter: Payer: Self-pay | Admitting: Family Medicine

## 2023-05-13 NOTE — Telephone Encounter (Signed)
Letter printed to rtn call to schedule a follow up appointment for medication refills. Dm/cma

## 2023-05-24 ENCOUNTER — Other Ambulatory Visit: Payer: Self-pay

## 2023-05-24 ENCOUNTER — Encounter (HOSPITAL_COMMUNITY): Payer: Self-pay | Admitting: *Deleted

## 2023-05-24 ENCOUNTER — Emergency Department (HOSPITAL_COMMUNITY)
Admission: EM | Admit: 2023-05-24 | Discharge: 2023-05-25 | Disposition: A | Discharge status: Home / Self Care | Payer: 59 | Attending: Emergency Medicine | Admitting: Emergency Medicine

## 2023-05-24 DIAGNOSIS — Z7902 Long term (current) use of antithrombotics/antiplatelets: Secondary | ICD-10-CM | POA: Diagnosis not present

## 2023-05-24 DIAGNOSIS — N189 Chronic kidney disease, unspecified: Secondary | ICD-10-CM | POA: Insufficient documentation

## 2023-05-24 DIAGNOSIS — R310 Gross hematuria: Secondary | ICD-10-CM | POA: Insufficient documentation

## 2023-05-24 DIAGNOSIS — E1122 Type 2 diabetes mellitus with diabetic chronic kidney disease: Secondary | ICD-10-CM | POA: Insufficient documentation

## 2023-05-24 DIAGNOSIS — I2510 Atherosclerotic heart disease of native coronary artery without angina pectoris: Secondary | ICD-10-CM | POA: Insufficient documentation

## 2023-05-24 DIAGNOSIS — E039 Hypothyroidism, unspecified: Secondary | ICD-10-CM | POA: Insufficient documentation

## 2023-05-24 DIAGNOSIS — D72829 Elevated white blood cell count, unspecified: Secondary | ICD-10-CM | POA: Insufficient documentation

## 2023-05-24 DIAGNOSIS — R319 Hematuria, unspecified: Secondary | ICD-10-CM | POA: Diagnosis present

## 2023-05-24 DIAGNOSIS — Z7982 Long term (current) use of aspirin: Secondary | ICD-10-CM | POA: Diagnosis not present

## 2023-05-24 DIAGNOSIS — I129 Hypertensive chronic kidney disease with stage 1 through stage 4 chronic kidney disease, or unspecified chronic kidney disease: Secondary | ICD-10-CM | POA: Insufficient documentation

## 2023-05-24 DIAGNOSIS — R300 Dysuria: Secondary | ICD-10-CM | POA: Diagnosis not present

## 2023-05-24 LAB — COMPREHENSIVE METABOLIC PANEL
ALT: 15 U/L (ref 0–44)
AST: 16 U/L (ref 15–41)
Albumin: 3.6 g/dL (ref 3.5–5.0)
Alkaline Phosphatase: 56 U/L (ref 38–126)
Anion gap: 9 (ref 5–15)
BUN: 49 mg/dL — ABNORMAL HIGH (ref 8–23)
CO2: 21 mmol/L — ABNORMAL LOW (ref 22–32)
Calcium: 8.5 mg/dL — ABNORMAL LOW (ref 8.9–10.3)
Chloride: 104 mmol/L (ref 98–111)
Creatinine, Ser: 2.43 mg/dL — ABNORMAL HIGH (ref 0.61–1.24)
GFR, Estimated: 28 mL/min — ABNORMAL LOW (ref >60)
Glucose, Bld: 149 mg/dL — ABNORMAL HIGH (ref 70–99)
Potassium: 4.1 mmol/L (ref 3.5–5.1)
Sodium: 134 mmol/L — ABNORMAL LOW (ref 135–145)
Total Bilirubin: 0.6 mg/dL (ref 0.3–1.2)
Total Protein: 7.2 g/dL (ref 6.5–8.1)

## 2023-05-24 LAB — CBC
HCT: 43.8 % (ref 39.0–52.0)
Hemoglobin: 13.8 g/dL (ref 13.0–17.0)
MCH: 22.3 pg — ABNORMAL LOW (ref 26.0–34.0)
MCHC: 31.5 g/dL (ref 30.0–36.0)
MCV: 70.6 fL — ABNORMAL LOW (ref 80.0–100.0)
Platelets: 187 10*3/uL (ref 150–400)
RBC: 6.2 MIL/uL — ABNORMAL HIGH (ref 4.22–5.81)
RDW: 15.3 % (ref 11.5–15.5)
WBC: 12.7 10*3/uL — ABNORMAL HIGH (ref 4.0–10.5)
nRBC: 0 % (ref 0.0–0.2)

## 2023-05-24 NOTE — ED Triage Notes (Signed)
The pt is c/o an inablity to urinate after he ate dinner and he had a little blood  no pain anywhere except the burning when he uirnates

## 2023-05-25 DIAGNOSIS — R310 Gross hematuria: Secondary | ICD-10-CM | POA: Diagnosis not present

## 2023-05-25 LAB — URINALYSIS, W/ REFLEX TO CULTURE (INFECTION SUSPECTED)
RBC / HPF: >50 RBC/hpf (ref 0–5)
Squamous Epithelial / HPF: NONE SEEN /HPF (ref 0–5)
WBC, UA: >50 WBC/hpf (ref 0–5)

## 2023-05-25 NOTE — ED Provider Notes (Signed)
  Physical Exam  BP (!) 169/94   Pulse 73   Temp 98.4 F (36.9 C)   Resp 17   Ht 5\' 8"  (1.727 m)   Wt 90.7 kg   SpO2 100%   BMI 30.40 kg/m   Physical Exam Abdominal:     General: Abdomen is flat. There is no distension.     Palpations: Abdomen is soft. There is no mass.     Tenderness: There is no abdominal tenderness.       Procedures  Procedures  ED Course / MDM    Medical Decision Making Amount and/or Complexity of Data Reviewed Labs: ordered.   Patient given shift handoff.  Waiting for urinalysis to come back, pending discharge.   Urinalysis shows gross hematuria, bacteria are rare -nitrates and leukocytes not reported due to gross hematuria.  Urine is being sent for culture.  Spoke with patient and reexamined.  Will return return to emergency room if he continues have difficulty with urination.  Given outpatient referral to urology.  Patient agrees to call today. He agrees to plan and all questions were answered. Stable for discharge.       Nicolas Ray 05/25/23 6440    Nicolas Munch, MD 05/26/23 [phone]

## 2023-05-25 NOTE — ED Provider Notes (Signed)
[place] EMERGENCY DEPARTMENT AT [facility] Provider Note   CSN: [id number] Arrival date & time: 05/24/23  2043     History  Chief Complaint  Patient presents with   painful urination    Nicolas Ray is a 72 y.o. male.  HPI   72 year old male with medical history significant for BPH, HTN, hypothyroidism, depression, diabetes mellitus, CKD, CAD, last saw a urologist in 2017 who presents to the emergency department with dysuria and inability to urinate with associated hematuria.  The patient denies any abdominal pain or flank pain.  He states that he had a spicy dinner tonight and subsequently developed burning while urinating.  He then noticed that he was having difficulty initiating urine stream.  He feels slightly dehydrated.  Denies any fevers or chills.  Home Medications Prior to Admission medications   Medication Sig Start Date End Date Taking? Authorizing Provider  aspirin EC 81 MG tablet Take 81 mg by mouth every other day. In the morning. Swallow whole.    [provider]  Cholecalciferol (VITAMIN D3 PO) Take 1 capsule by mouth in the morning.    [provider]  clopidogrel (PLAVIX) 75 MG tablet TAKE 1 TABLET(75 MG) BY MOUTH DAILY 12/08/22   Tonny Bollman, MD  COENZYME Q10 PO Take 1 capsule by mouth in the morning. Per patient taking 200 mg daily    [provider]  cyclobenzaprine (FLEXERIL) 10 MG tablet Take 1 tablet (10 mg total) by mouth 3 (three) times daily as needed for muscle spasms. 04/01/23   Candelaria Stagers, DPM  gabapentin (NEURONTIN) 300 MG capsule TAKE 1 CAPSULE(300 MG) BY MOUTH AT BEDTIME 04/24/23   Loyola Mast, MD  levothyroxine (SYNTHROID) 150 MCG tablet TAKE 1 TABLET(150 MCG) BY MOUTH DAILY BEFORE BREAKFAST 04/27/23   Loyola Mast, MD  metoprolol succinate (TOPROL-XL) 25 MG 24 hr tablet Take 25 mg by mouth in the morning. 11/21/22   [provider]  nitroGLYCERIN (NITROSTAT) 0.4 MG SL tablet Dissolve 1 tablet  under the tongue every 5 minutes as needed for chest pain. Max of 3 doses, then 911. 03/12/23   Tonny Bollman, MD  rosuvastatin (CRESTOR) 10 MG tablet TAKE 1 TABLET(10 MG) BY MOUTH DAILY 12/16/22   Tonny Bollman, MD      Allergies    Patient has no known allergies.    Review of Systems   Review of Systems  Genitourinary:  Positive for difficulty urinating, dysuria and hematuria.  All other systems reviewed and are negative.   Physical Exam Updated Vital Signs BP (!) 155/87 (BP Location: Right Arm)   Pulse 76   Temp 98.3 F (36.8 C) (Oral)   Resp 14   Ht 5\' 8"  (1.727 m)   Wt 90.7 kg   SpO2 100%   BMI 30.40 kg/m  Physical Exam Vitals and nursing note reviewed.  Constitutional:      General: He is not in acute distress. HENT:     Head: Normocephalic and atraumatic.  Eyes:     Conjunctiva/sclera: Conjunctivae normal.     Pupils: Pupils are equal, round, and reactive to light.  Cardiovascular:     Rate and Rhythm: Normal rate and regular rhythm.  Pulmonary:     Effort: Pulmonary effort is normal. No respiratory distress.  Abdominal:     General: There is no distension.     Tenderness: There is no abdominal tenderness. There is no right CVA tenderness, left CVA tenderness or guarding.  Musculoskeletal:  General: No deformity or signs of injury.     Cervical back: Neck supple.  Skin:    Findings: No lesion or rash.  Neurological:     General: No focal deficit present.     Mental Status: He is alert. Mental status is at baseline.     ED Results / Procedures / Treatments   Labs (all labs ordered are listed, but only abnormal results are displayed) Labs Reviewed  COMPREHENSIVE METABOLIC PANEL - Abnormal; Notable for the following components:      Result Value   Sodium 134 (*)    CO2 21 (*)    Glucose, Bld 149 (*)    BUN 49 (*)    Creatinine, Ser 2.43 (*)    Calcium 8.5 (*)    GFR, Estimated 28 (*)    All other components within normal limits  CBC -  Abnormal; Notable for the following components:   WBC 12.7 (*)    RBC 6.20 (*)    MCV 70.6 (*)    MCH 22.3 (*)    All other components within normal limits  URINALYSIS, W/ REFLEX TO CULTURE (INFECTION SUSPECTED)    EKG None  Radiology No results found.  Procedures Procedures    Medications Ordered in ED Medications - No data to display  ED Course/ Medical Decision Making/ A&P                                 Medical Decision Making   72 year old male with medical history significant for BPH, HTN, hypothyroidism, depression, diabetes mellitus, CKD, CAD, last saw a urologist in 2017 who presents to the emergency department with dysuria and inability to urinate with associated hematuria.  The patient denies any abdominal pain or flank pain.  He states that he had a spicy dinner tonight and subsequently developed burning while urinating.  He then noticed that he was having difficulty initiating urine stream.  He feels slightly dehydrated.  Denies any fevers or chills.  On arrival, the patient was afebrile, not tachycardic or tachypneic, BP 160/94, saturating 97% on room air.  Physical exam without suprapubic tenderness, no CVA tenderness, gross hematuria noted in the urinal.  The patient was having difficulty urinating.  No sharp pain to suggest nephrolithiasis.   A bladder scan was performed bedside which revealed 175 cc in the bladder. After oral hydration, the patient was abl;e to successfully void to provide a urine sample. His post void residual was 31cc. UA was collected and pending. I explained to the patient the importance of following up with urology outpatient for repeat evaluation given his gross hematuria. Labs revealed a mild leukocytosis to 12.7, no anemia, Cr at baseline at 2.43. Return precautions provided in the event the patient is unable to urinate at home. Signout given to [facility] pending UA results.    Final Clinical Impression(s) / ED Diagnoses Final  diagnoses:  Gross hematuria  Dysuria    Rx / DC Orders ED Discharge Orders     None         Ernie Avena, MD 05/25/23 [phone]

## 2023-05-25 NOTE — Discharge Instructions (Addendum)
Please follow-up with urology outpatient for your hematuria. Return to the Emergency Department if you have any difficulty urinating. Drink plenty of fluids over the next few days.

## 2023-05-26 LAB — URINE CULTURE: Culture: NO GROWTH

## 2023-05-27 ENCOUNTER — Ambulatory Visit: Payer: 59 | Admitting: Family Medicine

## 2023-05-27 DIAGNOSIS — N200 Calculus of kidney: Secondary | ICD-10-CM | POA: Diagnosis not present

## 2023-05-27 DIAGNOSIS — R310 Gross hematuria: Secondary | ICD-10-CM | POA: Diagnosis not present

## 2023-06-01 ENCOUNTER — Ambulatory Visit (INDEPENDENT_AMBULATORY_CARE_PROVIDER_SITE_OTHER): Payer: 59 | Admitting: Family Medicine

## 2023-06-01 ENCOUNTER — Encounter: Payer: Self-pay | Admitting: Family Medicine

## 2023-06-01 VITALS — BP 150/86 | HR 72 | Temp 97.9°F | Ht 68.0 in | Wt 199.0 lb

## 2023-06-01 DIAGNOSIS — R310 Gross hematuria: Secondary | ICD-10-CM | POA: Diagnosis not present

## 2023-06-01 DIAGNOSIS — I5022 Chronic systolic (congestive) heart failure: Secondary | ICD-10-CM

## 2023-06-01 DIAGNOSIS — E038 Other specified hypothyroidism: Secondary | ICD-10-CM

## 2023-06-01 DIAGNOSIS — I25110 Atherosclerotic heart disease of native coronary artery with unstable angina pectoris: Secondary | ICD-10-CM | POA: Diagnosis not present

## 2023-06-01 DIAGNOSIS — F5101 Primary insomnia: Secondary | ICD-10-CM

## 2023-06-01 DIAGNOSIS — Z91018 Allergy to other foods: Secondary | ICD-10-CM | POA: Diagnosis not present

## 2023-06-01 DIAGNOSIS — I10 Essential (primary) hypertension: Secondary | ICD-10-CM | POA: Diagnosis not present

## 2023-06-01 DIAGNOSIS — E1165 Type 2 diabetes mellitus with hyperglycemia: Secondary | ICD-10-CM | POA: Diagnosis not present

## 2023-06-01 DIAGNOSIS — G629 Polyneuropathy, unspecified: Secondary | ICD-10-CM | POA: Diagnosis not present

## 2023-06-01 DIAGNOSIS — E1140 Type 2 diabetes mellitus with diabetic neuropathy, unspecified: Secondary | ICD-10-CM | POA: Diagnosis not present

## 2023-06-01 DIAGNOSIS — N184 Chronic kidney disease, stage 4 (severe): Secondary | ICD-10-CM | POA: Diagnosis not present

## 2023-06-01 MED ORDER — GABAPENTIN 300 MG PO CAPS: 300.0000 mg | ORAL_CAPSULE | Freq: Every day | ORAL | 3 refills | Status: DC

## 2023-06-01 MED ORDER — TRAZODONE HCL 50 MG PO TABS
25.0000 mg | ORAL_TABLET | Freq: Every evening | ORAL | 3 refills | Status: DC | PRN
Start: 2023-06-01 — End: 2023-07-18

## 2023-06-01 MED ORDER — VALSARTAN 80 MG PO TABS
80.0000 mg | ORAL_TABLET | Freq: Every day | ORAL | 3 refills | Status: DC
Start: 2023-06-01 — End: 2023-10-09

## 2023-06-01 MED ORDER — LEVOTHYROXINE SODIUM 150 MCG PO TABS: 150.0000 ug | ORAL_TABLET | Freq: Every day | ORAL | 3 refills | Status: DC

## 2023-06-01 NOTE — Assessment & Plan Note (Signed)
I will try prescribing trazodone to see if this helps with sleep initiation.

## 2023-06-01 NOTE — Progress Notes (Signed)
[facility] [address] [place] [address] Dept: [phone] Dept Fax: [phone]  Chronic Care Office Visit  Subjective:    Patient ID: Nicolas Ray, male    DOB: Aug 12, 1951, 72 y.o..   MRN: [id number]  Chief Complaint  Patient presents with   Follow-up    F/u meds.  Has questions but would only talk to provider.    History of Present Illness:  Patient is in today for reassessment of chronic medical issues.He has not been seen by me since May 2023.  Nicolas Ray has a history of Type 2 diabetes. He continues to express disbelief that he has this condition and is not willing to take medication to treat this. I had previously prescribed him  dapagliflozin Marcelline Deist). He notes this caused side effects, so he stopped this.    Nicolas Ray has a history of a peripheral neuropathy. This has involved an occasional pin-like or poking sensations at the right lower leg. He was referred to neurology Nicolas Sickle, MD) who confirmed that this was likely early diabetic neuropathy. Nicolas Ray impression is that I sent him to the wrong physician and that they told him there was nothing that they could do for him. He later saw Nicolas Ray (podiatry). He notes he was started on cyclobenzaprine, which made him feel mental clouding, so he stopped. He deoes remain on gabapentin at bedtime. He feels this has improved his sleep, but wonders what else he could take for his sleep.   Nicolas Ray has a history of coronary artery disease. He suffered an STEMI in 2016. He refused CABG. He ended up having to DES placed in the mid LAD and proximal RCA. Additionally, he has chronic systolic heart failure. He was admitted to the hospital in 10/2022 with acute CHF.  He sees Dr. Excell Seltzer (cardiology). He is managed on aspirin 81 mg daily, clopidogrel 75 mg daily, metoprolol succinate XL 25 mg daily and rosuvastatin 10 mg daily. Nicolas Ray believes the metoprolol is causing a  buzzing sound in his head, which lasts for about 5 min. after he takes the medicine.    Nicolas Ray has hypothyroidism and is managed on levothyroxine 150 mcg daily.    Nicolas Ray notes he takes gabapentin at bedtime to help with his sleep. He finds this gives him 6-7 hours of uninterrupted sleep, which he finds satisfactory. However, he is worried that this medicine may not be good for him.   Nicolas Ray once again notes that he gets some itching when he eats nuts. He finds that taking Benadryl helps.  Past Medical History: Patient Active Problem List   Diagnosis Date Noted   HFrEF (heart failure with reduced ejection fraction) (HCC) 12/09/2022   Peripheral polyneuropathy 12/02/2021   Chronic kidney disease, stage 3b (HCC) 08/08/2021   Hyperlipidemia, unspecified 08/08/2021   Hypertensive heart disease with heart failure (HCC) 08/08/2021   Kidney stone 08/08/2021   Lower urinary tract symptoms 08/08/2021   Smoker 08/08/2021   Stented coronary artery 08/08/2021   History of ST elevation myocardial infarction (STEMI), of lateral wall 08/08/2021   OSA (obstructive sleep apnea) 09/21/2018   BPH with urinary obstruction 04/14/2016   Bilateral hydronephrosis 01/08/2016   Coronary artery disease involving native coronary artery of native heart with unstable angina pectoris (HCC)    Poorly controlled type 2 diabetes mellitus with neuropathy (HCC) 04/19/2015   Chronic systolic heart failure (HCC) 04/19/2015   Other male erectile dysfunction 04/18/2014  Sciatica 03/15/2013   Hypothyroidism 02/08/2013   Essential hypertension 02/08/2013   Depression 01/25/2013   Low back pain 01/25/2013   Past Surgical History:  Procedure Laterality Date   CARDIAC CATHETERIZATION N/A 04/18/2015   Procedure: Left Heart Cath and Coronary Angiography;  Surgeon: Tonny Bollman, MD;  Location: [facility] INVASIVE CV LAB;  Service: Cardiovascular;  Laterality: N/A;   CARDIAC CATHETERIZATION N/A 04/18/2015   Procedure:  Coronary Stent Intervention;  Surgeon: Tonny Bollman, MD;  Location: [facility] INVASIVE CV LAB;  Service: Cardiovascular;  Laterality: N/A;   CARDIAC CATHETERIZATION N/A 04/20/2015   Procedure: Coronary Stent Intervention;  Surgeon: Kathleene Hazel, MD;  Location: [facility] INVASIVE CV LAB;  Service: Cardiovascular;  Laterality: N/A;   CORONARY STENT PLACEMENT     RIGHT/LEFT HEART CATH AND CORONARY ANGIOGRAPHY N/A 12/09/2022   Procedure: RIGHT/LEFT HEART CATH AND CORONARY ANGIOGRAPHY;  Surgeon: Tonny Bollman, MD;  Location: [facility] INVASIVE CV LAB;  Service: Cardiovascular;  Laterality: N/A;   Family History  Problem Relation Age of Onset   Heart disease Mother    Cancer Father        Neck   Thyroid disease Sister    Stroke Brother    Colon cancer Neg Hx    Diabetes Neg Hx    Outpatient Medications Prior to Visit  Medication Sig Dispense Refill   aspirin EC 81 MG tablet Take 81 mg by mouth every other day. In the morning. Swallow whole.     Cholecalciferol (VITAMIN D3 PO) Take 1 capsule by mouth in the morning.     clopidogrel (PLAVIX) 75 MG tablet TAKE 1 TABLET(75 MG) BY MOUTH DAILY 90 tablet 3   COENZYME Q10 PO Take 1 capsule by mouth in the morning. Per patient taking 200 mg daily     gabapentin (NEURONTIN) 300 MG capsule TAKE 1 CAPSULE(300 MG) BY MOUTH AT BEDTIME 14 capsule 0   levothyroxine (SYNTHROID) 150 MCG tablet TAKE 1 TABLET(150 MCG) BY MOUTH DAILY BEFORE BREAKFAST 7 tablet 0   metoprolol succinate (TOPROL-XL) 25 MG 24 hr tablet Take 25 mg by mouth in the morning.     nitroGLYCERIN (NITROSTAT) 0.4 MG SL tablet Dissolve 1 tablet under the tongue every 5 minutes as needed for chest pain. Max of 3 doses, then 911. 25 tablet 5   rosuvastatin (CRESTOR) 10 MG tablet TAKE 1 TABLET(10 MG) BY MOUTH DAILY 90 tablet 3   cyclobenzaprine (FLEXERIL) 10 MG tablet Take 1 tablet (10 mg total) by mouth 3 (three) times daily as needed for muscle spasms. (Patient not taking: Reported on 06/01/2023) 30 tablet  0   No facility-administered medications prior to visit.   No Known Allergies Objective:   Today's Vitals   06/01/23 1328  BP: (!) 150/86  Pulse: 72  Temp: 97.9 F (36.6 C)  TempSrc: Temporal  SpO2: 98%  Weight: 199 lb (90.3 kg)  Height: 5\' 8"  (1.727 m)   Body mass index is 30.26 kg/m.   General: Well developed, well nourished. No acute distress. Psych: Alert and oriented. Normal mood and affect.  Health Maintenance Due  Topic Date Due   OPHTHALMOLOGY EXAM  Never done   Hepatitis C Screening  Never done   DTaP/Tdap/Td (1 - Tdap) Never done   Zoster Vaccines- Shingrix (1 of 2) Never done   Pneumonia Vaccine 51+ Years old (2 of 2 - PCV) 04/20/2016   FOOT EXAM  08/07/2017   Colonoscopy  10/24/2018   Medicare Annual Wellness (AWV)  09/21/2020   HEMOGLOBIN A1C  06/03/2022   Diabetic kidney evaluation - Urine ACR  08/08/2022     Assessment & Plan:   Problem List Items Addressed This Visit       Cardiovascular and Mediastinum   Chronic systolic heart failure (HCC) - Primary    Appears compensated. Continue metoprolol succinate 25 mg daily.      Relevant Medications   valsartan (DIOVAN) 80 MG tablet   Coronary artery disease involving native coronary artery of native heart with unstable angina pectoris (HCC)    Stable. No chest pain. Continue aspirin 81 mg daily and clopidogrel 75 mg daily.      Relevant Medications   valsartan (DIOVAN) 80 MG tablet   Essential hypertension    Blood pressure is high today. In light of his history of heart failure, I will try adding an ARB to his regimen. He is open to being on a blood pressure medicine.      Relevant Medications   valsartan (DIOVAN) 80 MG tablet     Endocrine   Hypothyroidism    I will renew his levothyroxine. I will plan to check his TSH at his next visit.      Relevant Medications   levothyroxine (SYNTHROID) 150 MCG tablet   Poorly controlled type 2 diabetes mellitus with neuropathy [facility])    Mr. Hilton  denies having diabetes and declines any medicine to treat this. I will try and check an A1c at his next visit.      Relevant Medications   valsartan (DIOVAN) 80 MG tablet     Nervous and Auditory   Peripheral polyneuropathy    I will continue the gabapentin for now, if Mr. Tuffy is willing to take this.      Relevant Medications   gabapentin (NEURONTIN) 300 MG capsule   traZODone (DESYREL) 50 MG tablet     Genitourinary   CKD (chronic kidney disease) stage 4, GFR 15-29 ml/min (HCC)    I discussed the most recent renal labs. Mr. Metayer declines to have further evaluation and declines to see a kidney specialist. He notes that he will let me know if her starts having any kidney pain. I explained that renal failure is typically not painful, but he declines to discuss this further.      Gross hematuria    Mr. Edenfield was seen at [facility] on 9/22 with hematuria. He notes this has since cleared up. He states he is not concerned about this and does not plan to follow-up with urology.        Other   Nut allergy    I once again advise that he avoid eating nuts due to his allergy symptoms. I explained that he could be at risk for anaphylaxis if he continues to expose himself to this food.      Primary insomnia    I will try prescribing trazodone to see if this helps with sleep initiation.      Relevant Medications   gabapentin (NEURONTIN) 300 MG capsule   traZODone (DESYREL) 50 MG tablet    Return in about 4 weeks (around 06/29/2023) for Reassessment.   Loyola Mast, MD

## 2023-06-01 NOTE — Assessment & Plan Note (Signed)
Appears compensated. Continue metoprolol succinate 25 mg daily.

## 2023-06-01 NOTE — Assessment & Plan Note (Signed)
I will continue the gabapentin for now, if Nicolas Ray is willing to take this.

## 2023-06-01 NOTE — Assessment & Plan Note (Signed)
I discussed the most recent renal labs. Nicolas Ray declines to have further evaluation and declines to see a kidney specialist. He notes that he will let me know if her starts having any kidney pain. I explained that renal failure is typically not painful, but he declines to discuss this further.

## 2023-06-01 NOTE — Assessment & Plan Note (Signed)
Nicolas Ray denies having diabetes and declines any medicine to treat this. I will try and check an A1c at his next visit.

## 2023-06-01 NOTE — Assessment & Plan Note (Signed)
I once again advise that he avoid eating nuts due to his allergy symptoms. I explained that he could be at risk for anaphylaxis if he continues to expose himself to this food.

## 2023-06-01 NOTE — Assessment & Plan Note (Signed)
Blood pressure is high today. In light of his history of heart failure, I will try adding an ARB to his regimen. He is open to being on a blood pressure medicine.

## 2023-06-01 NOTE — Assessment & Plan Note (Signed)
Nicolas Ray was seen at [facility] on 9/22 with hematuria. He notes this has since cleared up. He states he is not concerned about this and does not plan to follow-up with urology.

## 2023-06-01 NOTE — Assessment & Plan Note (Signed)
Stable. No chest pain. Continue aspirin 81 mg daily and clopidogrel 75 mg daily.

## 2023-06-01 NOTE — Assessment & Plan Note (Signed)
I will renew his levothyroxine. I will plan to check his TSH at his next visit.

## 2023-06-04 ENCOUNTER — Telehealth: Payer: Self-pay | Admitting: Family Medicine

## 2023-06-04 NOTE — Telephone Encounter (Signed)
Pt called concerned he had not heard anything. I was going to try to connect with [facility] but pt hung up before I could.

## 2023-06-04 NOTE — Telephone Encounter (Signed)
Pt called and said he was prescribed valsartan and when he takes it give him a whoosing noise in his head. So he would like to be prescribed the same medication as last year. Please give him a call

## 2023-06-05 NOTE — Telephone Encounter (Signed)
Patient notified VIA phone.  He will try taking half tablet to see how that works for him. Dm/cma

## 2023-06-24 ENCOUNTER — Telehealth: Payer: Self-pay

## 2023-06-24 ENCOUNTER — Other Ambulatory Visit: Payer: Self-pay

## 2023-06-24 DIAGNOSIS — E1121 Type 2 diabetes mellitus with diabetic nephropathy: Secondary | ICD-10-CM

## 2023-06-24 DIAGNOSIS — E1140 Type 2 diabetes mellitus with diabetic neuropathy, unspecified: Secondary | ICD-10-CM

## 2023-06-24 DIAGNOSIS — I5022 Chronic systolic (congestive) heart failure: Secondary | ICD-10-CM

## 2023-06-24 MED ORDER — METOPROLOL SUCCINATE ER 25 MG PO TB24: 25.0000 mg | ORAL_TABLET | Freq: Every morning | ORAL | 3 refills | Status: DC

## 2023-06-24 NOTE — Telephone Encounter (Signed)
Called patient to inform him he is due for micro/albumin and to schedule lab visit, patient declined lab visit and requested Rx refill  metoprolol succinate (TOPROL-XL) 25 MG 24 hr tablet  Patient was advised to call ordering provider but stated give message to PCP.  Please advise

## 2023-06-24 NOTE — Addendum Note (Signed)
Addended by: Loyola Mast on: 06/24/2023 05:00 PM   Modules accepted: Orders

## 2023-06-29 ENCOUNTER — Telehealth: Payer: Self-pay

## 2023-06-29 NOTE — Telephone Encounter (Signed)
Transition Care Management Follow-up Telephone Call Date of discharge and from where: 05/25/2023  How have you been since you were released from the hospital? Patient stated he is feeling better. Any questions or concerns? No  Items Reviewed: Did the pt receive and understand the discharge instructions provided? Yes  Medications obtained and verified?  No medication prescribed. Other? No  Any new allergies since your discharge? No  Dietary orders reviewed? Yes Do you have support at home? Yes   Follow up appointments reviewed:  PCP Hospital f/u appt confirmed? Yes  Scheduled to see Loyola Mast, MD on 06/01/2023 @ [facility]. Specialist Hospital f/u appt confirmed? Yes  Scheduled to see Sigmund Hazel on 05/25/2023 @ [facility]. Are transportation arrangements needed? No  If their condition worsens, is the pt aware to call PCP or go to the Emergency Dept.? Yes Was the patient provided with contact information for the PCP's office or ED? Yes Was to pt encouraged to call back with questions or concerns? Yes   Marcea Rojek [facility]  [facility] Guide Direct Dial: [phone]  Website: [email or web address]

## 2023-07-04 ENCOUNTER — Other Ambulatory Visit: Payer: Self-pay

## 2023-07-04 ENCOUNTER — Encounter (HOSPITAL_COMMUNITY): Payer: Self-pay | Admitting: Emergency Medicine

## 2023-07-04 ENCOUNTER — Emergency Department (HOSPITAL_COMMUNITY)
Admission: EM | Admit: 2023-07-04 | Discharge: 2023-07-05 | Discharge status: Against Medical Advice | Payer: 59 | Attending: Emergency Medicine | Admitting: Emergency Medicine

## 2023-07-04 DIAGNOSIS — K5730 Diverticulosis of large intestine without perforation or abscess without bleeding: Secondary | ICD-10-CM | POA: Diagnosis not present

## 2023-07-04 DIAGNOSIS — Z7982 Long term (current) use of aspirin: Secondary | ICD-10-CM | POA: Diagnosis not present

## 2023-07-04 DIAGNOSIS — N200 Calculus of kidney: Secondary | ICD-10-CM | POA: Diagnosis not present

## 2023-07-04 DIAGNOSIS — Z5321 Procedure and treatment not carried out due to patient leaving prior to being seen by health care provider: Secondary | ICD-10-CM | POA: Diagnosis not present

## 2023-07-04 DIAGNOSIS — N1330 Unspecified hydronephrosis: Secondary | ICD-10-CM | POA: Diagnosis not present

## 2023-07-04 DIAGNOSIS — N401 Enlarged prostate with lower urinary tract symptoms: Secondary | ICD-10-CM | POA: Diagnosis not present

## 2023-07-04 DIAGNOSIS — R319 Hematuria, unspecified: Secondary | ICD-10-CM | POA: Diagnosis not present

## 2023-07-04 DIAGNOSIS — N2889 Other specified disorders of kidney and ureter: Secondary | ICD-10-CM | POA: Diagnosis not present

## 2023-07-04 NOTE — ED Triage Notes (Signed)
Pt arrived POV c/o blood in urine, reports some stinging with urination that began yesterday. He reports he had an appt with urology about the same complaint about month ago but canceled because it resolved on its own. Denies pain at this time.

## 2023-07-05 ENCOUNTER — Encounter (HOSPITAL_BASED_OUTPATIENT_CLINIC_OR_DEPARTMENT_OTHER): Payer: Self-pay | Admitting: Emergency Medicine

## 2023-07-05 ENCOUNTER — Emergency Department (HOSPITAL_BASED_OUTPATIENT_CLINIC_OR_DEPARTMENT_OTHER)
Admission: EM | Admit: 2023-07-05 | Discharge: 2023-07-05 | Disposition: A | Discharge status: Home / Self Care | Payer: 59 | Source: Home / Self Care | Attending: Emergency Medicine | Admitting: Emergency Medicine

## 2023-07-05 ENCOUNTER — Emergency Department (HOSPITAL_BASED_OUTPATIENT_CLINIC_OR_DEPARTMENT_OTHER): Payer: 59

## 2023-07-05 ENCOUNTER — Other Ambulatory Visit: Payer: Self-pay

## 2023-07-05 DIAGNOSIS — N401 Enlarged prostate with lower urinary tract symptoms: Secondary | ICD-10-CM | POA: Insufficient documentation

## 2023-07-05 DIAGNOSIS — Z7982 Long term (current) use of aspirin: Secondary | ICD-10-CM | POA: Insufficient documentation

## 2023-07-05 DIAGNOSIS — N2889 Other specified disorders of kidney and ureter: Secondary | ICD-10-CM | POA: Diagnosis not present

## 2023-07-05 DIAGNOSIS — R319 Hematuria, unspecified: Secondary | ICD-10-CM

## 2023-07-05 DIAGNOSIS — N1330 Unspecified hydronephrosis: Secondary | ICD-10-CM | POA: Insufficient documentation

## 2023-07-05 DIAGNOSIS — N400 Enlarged prostate without lower urinary tract symptoms: Secondary | ICD-10-CM

## 2023-07-05 DIAGNOSIS — K5730 Diverticulosis of large intestine without perforation or abscess without bleeding: Secondary | ICD-10-CM | POA: Diagnosis not present

## 2023-07-05 DIAGNOSIS — N200 Calculus of kidney: Secondary | ICD-10-CM | POA: Diagnosis not present

## 2023-07-05 LAB — URINALYSIS, MICROSCOPIC (REFLEX)

## 2023-07-05 LAB — BASIC METABOLIC PANEL
Anion gap: 10 (ref 5–15)
BUN: 54 mg/dL — ABNORMAL HIGH (ref 8–23)
CO2: 18 mmol/L — ABNORMAL LOW (ref 22–32)
Calcium: 8.7 mg/dL — ABNORMAL LOW (ref 8.9–10.3)
Chloride: 103 mmol/L (ref 98–111)
Creatinine, Ser: 2.77 mg/dL — ABNORMAL HIGH (ref 0.61–1.24)
GFR, Estimated: 24 mL/min — ABNORMAL LOW (ref >60)
Glucose, Bld: 155 mg/dL — ABNORMAL HIGH (ref 70–99)
Potassium: 4.9 mmol/L (ref 3.5–5.1)
Sodium: 131 mmol/L — ABNORMAL LOW (ref 135–145)

## 2023-07-05 LAB — URINALYSIS, ROUTINE W REFLEX MICROSCOPIC

## 2023-07-05 LAB — CBC
HCT: 36.7 % — ABNORMAL LOW (ref 39.0–52.0)
Hemoglobin: 11.7 g/dL — ABNORMAL LOW (ref 13.0–17.0)
MCH: 22.1 pg — ABNORMAL LOW (ref 26.0–34.0)
MCHC: 31.9 g/dL (ref 30.0–36.0)
MCV: 69.2 fL — ABNORMAL LOW (ref 80.0–100.0)
Platelets: 204 10*3/uL (ref 150–400)
RBC: 5.3 MIL/uL (ref 4.22–5.81)
RDW: 14.7 % (ref 11.5–15.5)
WBC: 13.3 10*3/uL — ABNORMAL HIGH (ref 4.0–10.5)
nRBC: 0 % (ref 0.0–0.2)

## 2023-07-05 MED ORDER — CEPHALEXIN 250 MG PO CAPS
500.0000 mg | ORAL_CAPSULE | Freq: Once | ORAL | Status: AC
  Administered 2023-07-05: 500 mg via ORAL
  Filled 2023-07-05: qty 2

## 2023-07-05 MED ORDER — CEPHALEXIN 500 MG PO CAPS
500.0000 mg | ORAL_CAPSULE | Freq: Four times a day (QID) | ORAL | 0 refills | Status: DC
Start: 2023-07-05 — End: 2023-07-14

## 2023-07-05 NOTE — ED Provider Notes (Signed)
[place] [facility] Provider Note   CSN: [id number] Arrival date & time: 07/05/23  1245     History  Chief Complaint  Patient presents with   Hematuria    Nicolas Ray is a 72 y.o. male.  HPI Patient reports he started seeing blood in his urine day before yesterday.  He reports it has been coming out in large amounts.  He has seen clots.  He reports a little bit of burning with urination but not to severe.  He has noted some suprapubic pressure with urination but also not too severe.  Patient reports today the urine color is still not normal but he is not seeing clots today.  Patient had blood in the urine about a month ago and was referred to urology.  He reports he did go to the urology follow-up appointment but symptoms improved and he did not follow through with the recommended imaging or cystoscopy.  He reports symptoms did not return until day before yesterday.  Patient denies being sexually active.  He denies potential exposure to STI.  He denies any associated testicular pain.  No fevers no chills no flank pain    Home Medications Prior to Admission medications   Medication Sig Start Date End Date Taking? Authorizing Provider  cephALEXin (KEFLEX) 500 MG capsule Take 1 capsule (500 mg total) by mouth 4 (four) times daily. 07/05/23  Yes Arby Barrette, MD  aspirin EC 81 MG tablet Take 81 mg by mouth every other day. In the morning. Swallow whole.    [provider]  Cholecalciferol (VITAMIN D3 PO) Take 1 capsule by mouth in the morning.    [provider]  clopidogrel (PLAVIX) 75 MG tablet TAKE 1 TABLET(75 MG) BY MOUTH DAILY 12/08/22   Tonny Bollman, MD  COENZYME Q10 PO Take 1 capsule by mouth in the morning. Per patient taking 200 mg daily    [provider]  gabapentin (NEURONTIN) 300 MG capsule Take 1 capsule (300 mg total) by mouth at bedtime. 06/01/23   Loyola Mast, MD  levothyroxine (SYNTHROID) 150 MCG tablet  Take 1 tablet (150 mcg total) by mouth daily before breakfast. 06/01/23   Loyola Mast, MD  metoprolol succinate (TOPROL-XL) 25 MG 24 hr tablet Take 1 tablet (25 mg total) by mouth in the morning. 06/24/23   Loyola Mast, MD  nitroGLYCERIN (NITROSTAT) 0.4 MG SL tablet Dissolve 1 tablet under the tongue every 5 minutes as needed for chest pain. Max of 3 doses, then 911. 03/12/23   Tonny Bollman, MD  rosuvastatin (CRESTOR) 10 MG tablet TAKE 1 TABLET(10 MG) BY MOUTH DAILY 12/16/22   Tonny Bollman, MD  traZODone (DESYREL) 50 MG tablet Take 0.5-1 tablets (25-50 mg total) by mouth at bedtime as needed for sleep. 06/01/23   Loyola Mast, MD  valsartan (DIOVAN) 80 MG tablet Take 1 tablet (80 mg total) by mouth daily. 06/01/23   Loyola Mast, MD      Allergies    Patient has no known allergies.    Review of Systems   Review of Systems  Physical Exam Updated Vital Signs BP (!) 147/78   Pulse 80   Temp 98.1 F (36.7 C)   Resp 18   SpO2 95%  Physical Exam Constitutional:      Comments: Alert.  Normal mental status.  No respiratory distress.  No acute distress.  HENT:     Mouth/Throat:     Pharynx: Oropharynx  is clear.  Eyes:     Extraocular Movements: Extraocular movements intact.  Cardiovascular:     Rate and Rhythm: Normal rate and regular rhythm.  Pulmonary:     Effort: Pulmonary effort is normal.     Breath sounds: Normal breath sounds.  Abdominal:     General: There is no distension.     Palpations: Abdomen is soft.     Tenderness: There is no abdominal tenderness. There is no guarding.  Musculoskeletal:        General: No swelling or tenderness. Normal range of motion.  Skin:    General: Skin is warm and dry.  Neurological:     General: No focal deficit present.     Mental Status: He is oriented to person, place, and time.     Coordination: Coordination normal.  Psychiatric:        Mood and Affect: Mood normal.     ED Results / Procedures / Treatments    Labs (all labs ordered are listed, but only abnormal results are displayed) Labs Reviewed  URINALYSIS, ROUTINE W REFLEX MICROSCOPIC - Abnormal; Notable for the following components:      Result Value   Color, Urine AMBER (*)    APPearance TURBID (*)    Glucose, UA   (*)    Value: TEST NOT REPORTED DUE TO COLOR INTERFERENCE OF URINE PIGMENT   Hgb urine dipstick   (*)    Value: TEST NOT REPORTED DUE TO COLOR INTERFERENCE OF URINE PIGMENT   Bilirubin Urine   (*)    Value: TEST NOT REPORTED DUE TO COLOR INTERFERENCE OF URINE PIGMENT   Ketones, ur   (*)    Value: TEST NOT REPORTED DUE TO COLOR INTERFERENCE OF URINE PIGMENT   Protein, ur   (*)    Value: TEST NOT REPORTED DUE TO COLOR INTERFERENCE OF URINE PIGMENT   Nitrite   (*)    Value: TEST NOT REPORTED DUE TO COLOR INTERFERENCE OF URINE PIGMENT   Leukocytes,Ua   (*)    Value: TEST NOT REPORTED DUE TO COLOR INTERFERENCE OF URINE PIGMENT   All other components within normal limits  CBC - Abnormal; Notable for the following components:   WBC 13.3 (*)    Hemoglobin 11.7 (*)    HCT 36.7 (*)    MCV 69.2 (*)    MCH 22.1 (*)    All other components within normal limits  BASIC METABOLIC PANEL - Abnormal; Notable for the following components:   Sodium 131 (*)    CO2 18 (*)    Glucose, Bld 155 (*)    BUN 54 (*)    Creatinine, Ser 2.77 (*)    Calcium 8.7 (*)    GFR, Estimated 24 (*)    All other components within normal limits  URINALYSIS, MICROSCOPIC (REFLEX) - Abnormal; Notable for the following components:   Bacteria, UA FEW (*)    All other components within normal limits  URINE CULTURE    EKG None  Radiology CT Renal Stone Study  Result Date: 07/05/2023 CLINICAL DATA:  Hematuria for 2 days.  Flank pain. EXAM: CT ABDOMEN AND PELVIS WITHOUT CONTRAST TECHNIQUE: Multidetector CT imaging of the abdomen and pelvis was performed following the standard protocol without IV contrast. RADIATION DOSE REDUCTION: This exam was performed  according to the departmental dose-optimization program which includes automated exposure control, adjustment of the mA and/or kV according to patient size and/or use of iterative reconstruction technique. COMPARISON:  CT 08/11/2017. FINDINGS: Lower  chest: Bronchiectasis along the lingula. There is some basilar atelectasis. No pleural effusion. Coronary artery calcifications are seen. Hepatobiliary: No focal liver abnormality is seen. No gallstones, gallbladder wall thickening, or biliary dilatation. Pancreas: Moderate atrophy of the pancreas. Spleen: Normal in size without focal abnormality. Adrenals/Urinary Tract: Adrenal glands are preserved. Moderate bilateral renal collecting system dilatation, increased from previous with perinephric stranding. Punctate upper pole left-sided renal stone on coronal imaging. No ureteral stones. The dilatation seen down to the UVJ bilaterally. The urinary bladder has a slightly thickened wall there is an enlarged prostate. There is also a nodular area of thickening along the posterior aspect of the bladder in the midline on series 301, image 76 measuring 2.4 cm. Stomach/Bowel: On this non oral contrast exam, large bowel has a normal course and caliber. Scattered colonic stool. Few colonic diverticula. Normal appendix. Small bowel is nondilated. Vascular/Lymphatic: Extensive vascular calcifications identified. Retroaortic left renal vein. Normal caliber aorta and IVC. No specific abnormal lymph node enlargement identified in the abdomen and pelvis. Reproductive: Enlarged prostate with mass effect along the base of the bladder. Other: No free air or free fluid. Musculoskeletal: Scattered degenerative changes of the spine and pelvis. Old left-sided rib fractures. IMPRESSION: Diffuse dilatation of the renal collecting systems bilaterally, increased from previous. No ureteral stones. Significant perinephric stranding. Etiology is uncertain but this could relate to the enlarged  prostate and distended urinary bladder. Recommend follow up after emptying of the bladder to see if this persists. In addition there is a nodular area along the posterior aspect of the urinary bladder in the midline. Layering debris versus a mass lesion is possible. Recommend further evaluation. Overall postcontrast study with arterial phase and delayed imaging may be of some benefit to further define the etiology of the dilatation. There is 1 punctate nonobstructing upper pole left-sided renal stone. No bowel obstruction. Normal appendix. Extensive vascular calcifications. Electronically Signed   By: Karen Kays M.D.   On: 07/05/2023 17:17    Procedures Procedures    Medications Ordered in ED Medications  cephALEXin (KEFLEX) capsule 500 mg (500 mg Oral Given 07/05/23 1830)    ED Course/ Medical Decision Making/ A&P                                 Medical Decision Making Amount and/or Complexity of Data Reviewed Labs: ordered. Radiology: ordered.  Risk Prescription drug management.   Patient presents with large hematuria and having visualized clots.  He has a limited amount of pain.  Endorses some dysuria.  He had a similar episode about a month ago without follow-up.  At this time urine has significant amount of blood present.  Will proceed with CT scan to rule out mass or kidney stone.  CT scan personally reviewed by myself and radiology review.  Dilation of both kidneys with perinephric stranding.  No stones present.  Per radiology may be urinary bladder distention or possibly prostatic enlargement obstruction.  Patient BUN 54 and creatinine 2.7 with GFR 24.  This has been consistent over the past 8 months.  No significant acute change.  Urinalysis has turbidity and is interpreted as 21-50 RBCs and 21-50 WBC.  Patient had a postvoid bladder scan of about 400 cc.  Patient did not want to have a Foley catheter.  He reports that just after getting the bladder scan that was postvoid he  when he urinated again a large amount.  At this  time he declines catheter although I did explain that the dilation of his kidneys suggests chronic obstruction pattern and in light of intermittent clots, he may become completely obstructed.  Nonetheless, patient reports at this time he is not having pain and feels that he is urinating adequately and wants to follow-up with urology before having any Foley catheter placement.  We extensively reviewed the return precautions and the absolute need to follow-up with urology as soon as possible and complete diagnostic testing as instructed to make sure he does not have any mass or tumors that could be contributing to the obstructive type pattern and sporadic bleeding.   Patient had advised that somebody had told him he should follow-up with Dr. Meridee Score, I erroneously included this in his discharge instructions but then clarified by calling the patient's sister after discharge, who had called in for information and reportedly was the one who suggested Dr. Meridee Score.  I notified her that Dr. Meridee Score is not the correct doctor for follow-up.  We discussed the fact that he needs follow-up with urology and they are to call [facility] tomorrow.  Dr. Wilson Singer is on-call this evening.  I provided them with the contact number.  She is also updated on the plan of treatment with Keflex until seen.  She voiced understanding and intent to assist in managing the patient's care.        Final Clinical Impression(s) / ED Diagnoses Final diagnoses:  Hematuria, unspecified type  Hydronephrosis, unspecified hydronephrosis type  Prostate hypertrophy    Rx / DC Orders ED Discharge Orders          Ordered    cephALEXin (KEFLEX) 500 MG capsule  4 times daily        07/05/23 1832              Arby Barrette, MD 07/10/23 1403

## 2023-07-05 NOTE — ED Notes (Signed)
Pt states he is in a lot of pain. Pt no longer wants to wait since he sees no movement in the ED.

## 2023-07-05 NOTE — ED Triage Notes (Signed)
Hematuria for 2 days.  No recent surgeries.  No pain with urination, some burning sensation.

## 2023-07-05 NOTE — Discharge Instructions (Signed)
1.  You to be seen by the urologist to soon as possible.  Call Dr. Elesa Hacker office tomorrow morning. 2.  You may have a tumor in your bladder that is bleeding.  This can bleed periodically.  Because the blood goes away does not always mean that the problem has gone away.  Failing to do a follow-up and the recommended testing could result in missing a bladder cancer that needs treatment. 3.  You have dilation of the kidneys and ureters.  This means that for some amount of time now there has been pressure expanding the kidneys and the ureters.  This is often due to inability to completely clear her empty urine.  This process has been occurring for some time.  It is often due to enlargement of the prostate.  You must be seen by the urologist to further clarify why this is happening and how to treat it.

## 2023-07-06 DIAGNOSIS — R310 Gross hematuria: Secondary | ICD-10-CM | POA: Diagnosis not present

## 2023-07-06 DIAGNOSIS — R3914 Feeling of incomplete bladder emptying: Secondary | ICD-10-CM | POA: Diagnosis not present

## 2023-07-07 LAB — URINE CULTURE: Culture: NO GROWTH

## 2023-07-14 ENCOUNTER — Ambulatory Visit (INDEPENDENT_AMBULATORY_CARE_PROVIDER_SITE_OTHER): Payer: 59 | Admitting: Family Medicine

## 2023-07-14 ENCOUNTER — Encounter: Payer: Self-pay | Admitting: Family Medicine

## 2023-07-14 VITALS — BP 126/70 | HR 86 | Temp 98.2°F | Ht 68.0 in | Wt 196.4 lb

## 2023-07-14 DIAGNOSIS — J069 Acute upper respiratory infection, unspecified: Secondary | ICD-10-CM | POA: Diagnosis not present

## 2023-07-14 DIAGNOSIS — N1330 Unspecified hydronephrosis: Secondary | ICD-10-CM | POA: Diagnosis not present

## 2023-07-14 DIAGNOSIS — N200 Calculus of kidney: Secondary | ICD-10-CM | POA: Insufficient documentation

## 2023-07-14 DIAGNOSIS — R310 Gross hematuria: Secondary | ICD-10-CM

## 2023-07-14 DIAGNOSIS — N401 Enlarged prostate with lower urinary tract symptoms: Secondary | ICD-10-CM | POA: Diagnosis not present

## 2023-07-14 DIAGNOSIS — N138 Other obstructive and reflux uropathy: Secondary | ICD-10-CM | POA: Diagnosis not present

## 2023-07-14 DIAGNOSIS — N184 Chronic kidney disease, stage 4 (severe): Secondary | ICD-10-CM | POA: Diagnosis not present

## 2023-07-14 NOTE — Progress Notes (Signed)
[facility] [address] [place] [address] Dept: [phone] Dept Fax: [phone]  Office Visit  Subjective:    Patient ID: Nicolas Ray, male    DOB: 1951/02/27, 72 y.o..   MRN: [id number]  Chief Complaint  Patient presents with   Cough    C/o having cough, chills and weakness x 2 days.   No OTC meds taken.    History of Present Illness:  Patient is in today complaining of a 2-day history of cough, nasal congestion and sneezing, and fatigue. He states that he has not had a cold since the 1970s. He is surprised to be having these symptoms and seems perplexed as to where he might have contracted this.  Nicolas Ray presented to [facility] on 11/2, but did not stay to be seen. He then presented at [facility] on 11/3 with gross hematuria. He had an evaluation. Urology was consulted and he was treated with a course of cephalexin, which he has completed. He also was started on tamsulosin 0.4 mg at bedtime. He notes that when he takes this together with his gabapentin, he is unable to sleep.  Nicolas Ray has a complex history of poorly controlled Type 2 diabetes (which he feels he does not have, hypothyroidism, peripheral neuropathy, and CAD with a prior STEMI with PCI and DES placement in the mid LAD and proximal RCA in 2016. He is managed on a daily 81 mg aspirin and clopidogrel 75 mg.  Past Medical History: Patient Active Problem List   Diagnosis Date Noted   Right nephrolithiasis 07/14/2023   Nut allergy 06/01/2023   Gross hematuria 06/01/2023   Primary insomnia 06/01/2023   HFrEF (heart failure with reduced ejection fraction) (HCC) 12/09/2022   Ischemic cardiomyopathy 11/19/2022   Peripheral polyneuropathy 12/02/2021   CKD (chronic kidney disease) stage 4, GFR 15-29 ml/min (HCC) 08/08/2021   Hyperlipidemia, unspecified 08/08/2021   Hypertensive heart disease with heart failure (HCC) 08/08/2021   Lower urinary tract symptoms 08/08/2021    Smoker 08/08/2021   Stented coronary artery 08/08/2021   History of ST elevation myocardial infarction (STEMI), of lateral wall 08/08/2021   OSA (obstructive sleep apnea) 09/21/2018   BPH with urinary obstruction 04/14/2016   Bilateral hydronephrosis 01/08/2016   Urinary retention 01/08/2016   Coronary artery disease involving native coronary artery of native heart with unstable angina pectoris (HCC)    Poorly controlled type 2 diabetes mellitus with neuropathy (HCC) 04/19/2015   Chronic systolic heart failure (HCC) 04/19/2015   Other male erectile dysfunction 04/18/2014   Sciatica 03/15/2013   Hypothyroidism 02/08/2013   Essential hypertension 02/08/2013   Depression 01/25/2013   Low back pain 01/25/2013   Past Surgical History:  Procedure Laterality Date   CARDIAC CATHETERIZATION N/A 04/18/2015   Procedure: Left Heart Cath and Coronary Angiography;  Surgeon: Tonny Bollman, MD;  Location: [facility] INVASIVE CV LAB;  Service: Cardiovascular;  Laterality: N/A;   CARDIAC CATHETERIZATION N/A 04/18/2015   Procedure: Coronary Stent Intervention;  Surgeon: Tonny Bollman, MD;  Location: [facility] INVASIVE CV LAB;  Service: Cardiovascular;  Laterality: N/A;   CARDIAC CATHETERIZATION N/A 04/20/2015   Procedure: Coronary Stent Intervention;  Surgeon: Kathleene Hazel, MD;  Location: [facility] INVASIVE CV LAB;  Service: Cardiovascular;  Laterality: N/A;   CORONARY STENT PLACEMENT     RIGHT/LEFT HEART CATH AND CORONARY ANGIOGRAPHY N/A 12/09/2022   Procedure: RIGHT/LEFT HEART CATH AND CORONARY ANGIOGRAPHY;  Surgeon: Tonny Bollman, MD;  Location: [facility] INVASIVE CV LAB;  Service: Cardiovascular;  Laterality: N/A;   Family History  Problem Relation Age of Onset   Heart disease Mother    Cancer Father        Neck   Thyroid disease Sister    Stroke Brother    Colon cancer Neg Hx    Diabetes Neg Hx    Outpatient Medications Prior to Visit  Medication Sig Dispense Refill   aspirin EC 81 MG tablet Take 81 mg  by mouth every other day. In the morning. Swallow whole.     Cholecalciferol (VITAMIN D3 PO) Take 1 capsule by mouth in the morning.     clopidogrel (PLAVIX) 75 MG tablet TAKE 1 TABLET(75 MG) BY MOUTH DAILY 90 tablet 3   COENZYME Q10 PO Take 1 capsule by mouth in the morning. Per patient taking 200 mg daily     gabapentin (NEURONTIN) 300 MG capsule Take 1 capsule (300 mg total) by mouth at bedtime. 90 capsule 3   levothyroxine (SYNTHROID) 150 MCG tablet Take 1 tablet (150 mcg total) by mouth daily before breakfast. 90 tablet 3   metoprolol succinate (TOPROL-XL) 25 MG 24 hr tablet Take 1 tablet (25 mg total) by mouth in the morning. 90 tablet 3   nitroGLYCERIN (NITROSTAT) 0.4 MG SL tablet Dissolve 1 tablet under the tongue every 5 minutes as needed for chest pain. Max of 3 doses, then 911. 25 tablet 5   rosuvastatin (CRESTOR) 10 MG tablet TAKE 1 TABLET(10 MG) BY MOUTH DAILY 90 tablet 3   tamsulosin (FLOMAX) 0.4 MG CAPS capsule Take 0.4 mg by mouth daily.     traZODone (DESYREL) 50 MG tablet Take 0.5-1 tablets (25-50 mg total) by mouth at bedtime as needed for sleep. 30 tablet 3   valsartan (DIOVAN) 80 MG tablet Take 1 tablet (80 mg total) by mouth daily. 90 tablet 3   cephALEXin (KEFLEX) 500 MG capsule Take 1 capsule (500 mg total) by mouth 4 (four) times daily. 28 capsule 0   No facility-administered medications prior to visit.   No Known Allergies   Objective:   Today's Vitals   07/14/23 1302  BP: 126/70  Pulse: 86  Temp: 98.2 F (36.8 C)  TempSrc: Temporal  SpO2: 98%  Weight: 196 lb 6.4 oz (89.1 kg)  Height: 5\' 8"  (1.727 m)   Body mass index is 29.86 kg/m.   General: Well developed, well nourished. No acute distress. HEENT: Normocephalic, non-traumatic. Conjunctiva clear. External ears normal. EAC and TMs normal bilaterally. Nose    clear without congestion or rhinorrhea. Mucous membranes moist. Oropharynx clear. Good dentition. Neck: Supple. No lymphadenopathy. No  thyromegaly. Lungs: Clear to auscultation bilaterally. No wheezing, rales or rhonchi. CV: RRR with a II/VI systolic murmur. Pulses 2+ bilaterally. Back: Straight. No CVA tenderness bilaterally. Psych: Alert and oriented. Normal mood and affect.  Health Maintenance Due  Topic Date Due   COVID-19 Vaccine (1) Never done   OPHTHALMOLOGY EXAM  Never done   Hepatitis C Screening  Never done   DTaP/Tdap/Td (1 - Tdap) Never done   Zoster Vaccines- Shingrix (1 of 2) Never done   Pneumonia Vaccine 20+ Years old (2 of 2 - PCV) 04/20/2016   FOOT EXAM  08/07/2017   Colonoscopy  10/24/2018   Medicare Annual Wellness (AWV)  09/21/2020   HEMOGLOBIN A1C  06/03/2022   Diabetic kidney evaluation - Urine ACR  08/08/2022   Lab Results    Latest Ref Rng & Units 07/05/2023    2:34 PM 05/24/2023   10:21 PM  03/12/2023   10:44 AM  CBC  WBC 4.0 - 10.5 K/uL 13.3  12.7  9.8   Hemoglobin 13.0 - 17.0 g/dL 82.9  56.2  13.0   Hematocrit 39.0 - 52.0 % 36.7  43.8  45.9   Platelets 150 - 400 K/uL 204  187  184        Latest Ref Rng & Units 07/05/2023    2:34 PM 05/24/2023   10:21 PM 03/12/2023   10:44 AM  CMP  Glucose 70 - 99 mg/dL 865  784  696   BUN 8 - 23 mg/dL 54  49  40   Creatinine 0.61 - 1.24 mg/dL 2.95  2.84  1.32   Sodium 135 - 145 mmol/L 131  134  136   Potassium 3.5 - 5.1 mmol/L 4.9  4.1  4.6   Chloride 98 - 111 mmol/L 103  104  100   CO2 22 - 32 mmol/L 18  21  20    Calcium 8.9 - 10.3 mg/dL 8.7  8.5  9.2   Total Protein 6.5 - 8.1 g/dL  7.2    Total Bilirubin 0.3 - 1.2 mg/dL  0.6    Alkaline Phos 38 - 126 U/L  56    AST 15 - 41 U/L  16    ALT 0 - 44 U/L  15     Imaging: CT Renal Stone Study (07/05/2023) IMPRESSION: Diffuse dilatation of the renal collecting systems bilaterally, increased from previous. No ureteral stones. Significant perinephric stranding. Etiology is uncertain but this could relate to the enlarged prostate and distended urinary bladder. Recommend follow up after emptying of  the bladder to see if this persists.   In addition there is a nodular area along the posterior aspect of the urinary bladder in the midline. Layering debris versus a mass lesion is possible. Recommend further evaluation.   Overall postcontrast study with arterial phase and delayed imaging may be of some benefit to further define the etiology of the dilatation.   There is 1 punctate nonobstructing upper pole left-sided renal stone.   No bowel obstruction. Normal appendix. Extensive vascular calcifications.    Assessment & Plan:   Problem List Items Addressed This Visit       Respiratory   Viral URI with cough - Primary    We discussed that he might have contracted a virus in many settings, particularly during his recent ED visit. Discussed home care for viral illness, including rest, pushing fluids, and OTC medications as needed for symptom relief. Recommend hot tea with honey for sore throat symptoms. Recommend 4-5 days of Afrin for nasal congestion. Follow-up if needed for worsening or persistent symptoms.         Genitourinary   Bilateral hydronephrosis    Mr. Roerig notes he was seen by [facility]. This could be a source of chronic renal failure for him. BPH may be the cause of urinary obstruction leading to his hydronephrosis.      BPH with urinary obstruction    Currently on tamsulosin 0.4 mg at bedtime. Because of his concerns about the interaction of the tamsulosin and his gabapentin related to his sleep, I recommend he consider moving the tamsulosin to around 6:00 pm.       Relevant Medications   tamsulosin (FLOMAX) 0.4 MG CAPS capsule   CKD (chronic kidney disease) stage 4, GFR 15-29 ml/min (HCC)    [name] I had previously recommended referral to nephrology, but Mr. Kochan has  declined.      Gross hematuria    Mr. Thalman notes this has resolved. The recent CT scan noted a nodular area in the bladder. This could be the source of bleeding. He is  engaged with urology.       Return in about 4 weeks (around 08/11/2023) for Reassessment.   Loyola Mast, MD

## 2023-07-14 NOTE — Assessment & Plan Note (Signed)
Currently on tamsulosin 0.4 mg at bedtime. Because of his concerns about the interaction of the tamsulosin and his gabapentin related to his sleep, I recommend he consider moving the tamsulosin to around 6:00 pm.

## 2023-07-14 NOTE — Assessment & Plan Note (Signed)
Mr. Hoaglin notes he was seen by [facility]. This could be a source of chronic renal failure for him. BPH may be the cause of urinary obstruction leading to his hydronephrosis.

## 2023-07-14 NOTE — Assessment & Plan Note (Signed)
[name] I had previously recommended referral to nephrology, but Nicolas Ray has declined.

## 2023-07-14 NOTE — Assessment & Plan Note (Addendum)
We discussed that he might have contracted a virus in many settings, particularly during his recent ED visit. Discussed home care for viral illness, including rest, pushing fluids, and OTC medications as needed for symptom relief. Recommend hot tea with honey for sore throat symptoms. Recommend 4-5 days of Afrin for nasal congestion. Follow-up if needed for worsening or persistent symptoms.

## 2023-07-14 NOTE — Assessment & Plan Note (Signed)
Mr. Merrihew notes this has resolved. The recent CT scan noted a nodular area in the bladder. This could be the source of bleeding. He is engaged with urology.

## 2023-07-17 ENCOUNTER — Telehealth: Payer: Self-pay | Admitting: Family Medicine

## 2023-07-17 ENCOUNTER — Other Ambulatory Visit: Payer: Self-pay

## 2023-07-17 ENCOUNTER — Emergency Department (HOSPITAL_COMMUNITY): Payer: 59

## 2023-07-17 ENCOUNTER — Inpatient Hospital Stay (HOSPITAL_COMMUNITY)
Admission: EM | Admit: 2023-07-17 | Discharge: 2023-07-20 | DRG: 377 | Disposition: A | Discharge status: Home / Self Care | Payer: 59 | Attending: Emergency Medicine | Admitting: Emergency Medicine

## 2023-07-17 ENCOUNTER — Encounter (HOSPITAL_COMMUNITY): Payer: Self-pay

## 2023-07-17 DIAGNOSIS — B9681 Helicobacter pylori [H. pylori] as the cause of diseases classified elsewhere: Secondary | ICD-10-CM | POA: Diagnosis present

## 2023-07-17 DIAGNOSIS — K2990 Gastroduodenitis, unspecified, without bleeding: Secondary | ICD-10-CM | POA: Diagnosis not present

## 2023-07-17 DIAGNOSIS — R338 Other retention of urine: Secondary | ICD-10-CM | POA: Diagnosis not present

## 2023-07-17 DIAGNOSIS — K3189 Other diseases of stomach and duodenum: Secondary | ICD-10-CM | POA: Diagnosis present

## 2023-07-17 DIAGNOSIS — I130 Hypertensive heart and chronic kidney disease with heart failure and stage 1 through stage 4 chronic kidney disease, or unspecified chronic kidney disease: Secondary | ICD-10-CM | POA: Diagnosis not present

## 2023-07-17 DIAGNOSIS — I255 Ischemic cardiomyopathy: Secondary | ICD-10-CM | POA: Diagnosis not present

## 2023-07-17 DIAGNOSIS — Z955 Presence of coronary angioplasty implant and graft: Secondary | ICD-10-CM | POA: Diagnosis not present

## 2023-07-17 DIAGNOSIS — R079 Chest pain, unspecified: Secondary | ICD-10-CM | POA: Diagnosis not present

## 2023-07-17 DIAGNOSIS — E1122 Type 2 diabetes mellitus with diabetic chronic kidney disease: Secondary | ICD-10-CM | POA: Diagnosis not present

## 2023-07-17 DIAGNOSIS — K449 Diaphragmatic hernia without obstruction or gangrene: Secondary | ICD-10-CM | POA: Diagnosis present

## 2023-07-17 DIAGNOSIS — Z8249 Family history of ischemic heart disease and other diseases of the circulatory system: Secondary | ICD-10-CM | POA: Diagnosis not present

## 2023-07-17 DIAGNOSIS — D62 Acute posthemorrhagic anemia: Secondary | ICD-10-CM | POA: Diagnosis not present

## 2023-07-17 DIAGNOSIS — I25810 Atherosclerosis of coronary artery bypass graft(s) without angina pectoris: Secondary | ICD-10-CM | POA: Diagnosis present

## 2023-07-17 DIAGNOSIS — Z79890 Hormone replacement therapy (postmenopausal): Secondary | ICD-10-CM

## 2023-07-17 DIAGNOSIS — K269 Duodenal ulcer, unspecified as acute or chronic, without hemorrhage or perforation: Secondary | ICD-10-CM | POA: Diagnosis present

## 2023-07-17 DIAGNOSIS — E039 Hypothyroidism, unspecified: Secondary | ICD-10-CM | POA: Diagnosis present

## 2023-07-17 DIAGNOSIS — N400 Enlarged prostate without lower urinary tract symptoms: Secondary | ICD-10-CM | POA: Diagnosis present

## 2023-07-17 DIAGNOSIS — K2100 Gastro-esophageal reflux disease with esophagitis, without bleeding: Secondary | ICD-10-CM | POA: Diagnosis present

## 2023-07-17 DIAGNOSIS — Z7902 Long term (current) use of antithrombotics/antiplatelets: Secondary | ICD-10-CM

## 2023-07-17 DIAGNOSIS — I5022 Chronic systolic (congestive) heart failure: Secondary | ICD-10-CM | POA: Diagnosis not present

## 2023-07-17 DIAGNOSIS — K2970 Gastritis, unspecified, without bleeding: Secondary | ICD-10-CM | POA: Diagnosis present

## 2023-07-17 DIAGNOSIS — K2950 Unspecified chronic gastritis without bleeding: Secondary | ICD-10-CM | POA: Diagnosis not present

## 2023-07-17 DIAGNOSIS — Z79899 Other long term (current) drug therapy: Secondary | ICD-10-CM

## 2023-07-17 DIAGNOSIS — K922 Gastrointestinal hemorrhage, unspecified: Principal | ICD-10-CM | POA: Diagnosis present

## 2023-07-17 DIAGNOSIS — Z8349 Family history of other endocrine, nutritional and metabolic diseases: Secondary | ICD-10-CM

## 2023-07-17 DIAGNOSIS — R578 Other shock: Secondary | ICD-10-CM | POA: Diagnosis not present

## 2023-07-17 DIAGNOSIS — N184 Chronic kidney disease, stage 4 (severe): Secondary | ICD-10-CM | POA: Diagnosis not present

## 2023-07-17 DIAGNOSIS — K921 Melena: Secondary | ICD-10-CM

## 2023-07-17 DIAGNOSIS — Z7982 Long term (current) use of aspirin: Secondary | ICD-10-CM | POA: Diagnosis not present

## 2023-07-17 DIAGNOSIS — R0789 Other chest pain: Secondary | ICD-10-CM | POA: Diagnosis not present

## 2023-07-17 DIAGNOSIS — R404 Transient alteration of awareness: Secondary | ICD-10-CM | POA: Diagnosis not present

## 2023-07-17 DIAGNOSIS — Z823 Family history of stroke: Secondary | ICD-10-CM | POA: Diagnosis not present

## 2023-07-17 DIAGNOSIS — F17210 Nicotine dependence, cigarettes, uncomplicated: Secondary | ICD-10-CM | POA: Diagnosis present

## 2023-07-17 DIAGNOSIS — N1832 Chronic kidney disease, stage 3b: Secondary | ICD-10-CM | POA: Diagnosis present

## 2023-07-17 DIAGNOSIS — I499 Cardiac arrhythmia, unspecified: Secondary | ICD-10-CM | POA: Diagnosis not present

## 2023-07-17 DIAGNOSIS — K264 Chronic or unspecified duodenal ulcer with hemorrhage: Secondary | ICD-10-CM | POA: Diagnosis not present

## 2023-07-17 DIAGNOSIS — R6889 Other general symptoms and signs: Secondary | ICD-10-CM | POA: Diagnosis not present

## 2023-07-17 DIAGNOSIS — I2510 Atherosclerotic heart disease of native coronary artery without angina pectoris: Secondary | ICD-10-CM | POA: Diagnosis present

## 2023-07-17 DIAGNOSIS — I252 Old myocardial infarction: Secondary | ICD-10-CM | POA: Diagnosis not present

## 2023-07-17 DIAGNOSIS — I700 Atherosclerosis of aorta: Secondary | ICD-10-CM | POA: Diagnosis not present

## 2023-07-17 DIAGNOSIS — R000 Tachycardia, unspecified: Secondary | ICD-10-CM | POA: Diagnosis not present

## 2023-07-17 DIAGNOSIS — J40 Bronchitis, not specified as acute or chronic: Secondary | ICD-10-CM | POA: Diagnosis not present

## 2023-07-17 LAB — CBC
HCT: 21.4 % — ABNORMAL LOW (ref 39.0–52.0)
Hemoglobin: 6.6 g/dL — CL (ref 13.0–17.0)
MCH: 22 pg — ABNORMAL LOW (ref 26.0–34.0)
MCHC: 30.8 g/dL (ref 30.0–36.0)
MCV: 71.3 fL — ABNORMAL LOW (ref 80.0–100.0)
Platelets: 205 10*3/uL (ref 150–400)
RBC: 3 MIL/uL — ABNORMAL LOW (ref 4.22–5.81)
RDW: 14.6 % (ref 11.5–15.5)
WBC: 11.7 10*3/uL — ABNORMAL HIGH (ref 4.0–10.5)
nRBC: 0 % (ref 0.0–0.2)

## 2023-07-17 LAB — ABO/RH: ABO/RH(D): O POS

## 2023-07-17 LAB — BASIC METABOLIC PANEL
Anion gap: 10 (ref 5–15)
BUN: 107 mg/dL — ABNORMAL HIGH (ref 8–23)
CO2: 17 mmol/L — ABNORMAL LOW (ref 22–32)
Calcium: 8.8 mg/dL — ABNORMAL LOW (ref 8.9–10.3)
Chloride: 107 mmol/L (ref 98–111)
Creatinine, Ser: 2.59 mg/dL — ABNORMAL HIGH (ref 0.61–1.24)
GFR, Estimated: 26 mL/min — ABNORMAL LOW (ref >60)
Glucose, Bld: 166 mg/dL — ABNORMAL HIGH (ref 70–99)
Potassium: 4.9 mmol/L (ref 3.5–5.1)
Sodium: 134 mmol/L — ABNORMAL LOW (ref 135–145)

## 2023-07-17 LAB — POC OCCULT BLOOD, ED: Fecal Occult Bld: POSITIVE — AB

## 2023-07-17 LAB — TROPONIN I (HIGH SENSITIVITY)
Troponin I (High Sensitivity): 15 ng/L (ref <18)
Troponin I (High Sensitivity): 14 ng/L (ref <18)

## 2023-07-17 LAB — PREPARE RBC (CROSSMATCH)

## 2023-07-17 MED ORDER — LACTATED RINGERS IV BOLUS: 500.0000 mL | Freq: Once | INTRAVENOUS | Status: DC

## 2023-07-17 MED ORDER — PANTOPRAZOLE SODIUM 40 MG IV SOLR
40.0000 mg | Freq: Once | INTRAVENOUS | Status: AC
  Administered 2023-07-17: 40 mg via INTRAVENOUS
  Filled 2023-07-17: qty 10

## 2023-07-17 MED ORDER — PANTOPRAZOLE SODIUM 40 MG IV SOLR: 40.0000 mg | Freq: Once | INTRAVENOUS | Status: DC

## 2023-07-17 MED ORDER — POLYETHYLENE GLYCOL 3350 17 G PO PACK
17.0000 g | PACK | Freq: Every day | ORAL | Status: DC | PRN
  Administered 2023-07-19: 17 g via ORAL
  Filled 2023-07-17: qty 1

## 2023-07-17 MED ORDER — DOCUSATE SODIUM 100 MG PO CAPS
100.0000 mg | ORAL_CAPSULE | Freq: Two times a day (BID) | ORAL | Status: DC | PRN
  Administered 2023-07-19: 100 mg via ORAL
  Filled 2023-07-17: qty 1

## 2023-07-17 MED ORDER — NOREPINEPHRINE 4 MG/250ML-% IV SOLN
2.0000 ug/min | INTRAVENOUS | Status: DC
Start: 2023-07-18 — End: 2023-07-19
  Filled 2023-07-17: qty 250

## 2023-07-17 MED ORDER — SODIUM CHLORIDE 0.9% IV SOLUTION
Freq: Once | INTRAVENOUS | Status: AC
Start: 2023-07-17 — End: 2023-07-17

## 2023-07-17 MED ORDER — SODIUM CHLORIDE 0.9 % IV SOLN
250.0000 mL | INTRAVENOUS | Status: AC
  Administered 2023-07-18: 250 mL via INTRAVENOUS

## 2023-07-17 MED ORDER — SODIUM CHLORIDE 0.9% IV SOLUTION: Freq: Once | INTRAVENOUS | Status: AC

## 2023-07-17 NOTE — ED Provider Notes (Signed)
[place] EMERGENCY DEPARTMENT AT [facility] Provider Note   CSN: [id number] Arrival date & time: 07/17/23  2052     History  Chief Complaint  Patient presents with   Chest Pain    Nicolas Ray is a 72 y.o. male.   Chest Pain    Pt presented to the ED with pressure in his chest and nausea.   Pt had not eaten much today.  He still has pressure in his chest.  It increases with walking.  No fever, some cough occasionally.  He also noticed dark colored stool.    No history of gi bleed.    Pt does have history of MI, s/p stent  Home Medications Prior to Admission medications   Medication Sig Start Date End Date Taking? Authorizing Provider  aspirin EC 81 MG tablet Take 81 mg by mouth every other day. In the morning. Swallow whole.    [provider]  Cholecalciferol (VITAMIN D3 PO) Take 1 capsule by mouth in the morning.    [provider]  clopidogrel (PLAVIX) 75 MG tablet TAKE 1 TABLET(75 MG) BY MOUTH DAILY 12/08/22   Tonny Bollman, MD  COENZYME Q10 PO Take 1 capsule by mouth in the morning. Per patient taking 200 mg daily    [provider]  gabapentin (NEURONTIN) 300 MG capsule Take 1 capsule (300 mg total) by mouth at bedtime. 06/01/23   Loyola Mast, MD  levothyroxine (SYNTHROID) 150 MCG tablet Take 1 tablet (150 mcg total) by mouth daily before breakfast. 06/01/23   Loyola Mast, MD  metoprolol succinate (TOPROL-XL) 25 MG 24 hr tablet Take 1 tablet (25 mg total) by mouth in the morning. 06/24/23   Loyola Mast, MD  nitroGLYCERIN (NITROSTAT) 0.4 MG SL tablet Dissolve 1 tablet under the tongue every 5 minutes as needed for chest pain. Max of 3 doses, then 911. 03/12/23   Tonny Bollman, MD  rosuvastatin (CRESTOR) 10 MG tablet TAKE 1 TABLET(10 MG) BY MOUTH DAILY 12/16/22   Tonny Bollman, MD  tamsulosin (FLOMAX) 0.4 MG CAPS capsule Take 0.4 mg by mouth daily. 07/07/23   [provider]  traZODone (DESYREL) 50 MG tablet Take  0.5-1 tablets (25-50 mg total) by mouth at bedtime as needed for sleep. 06/01/23   Loyola Mast, MD  valsartan (DIOVAN) 80 MG tablet Take 1 tablet (80 mg total) by mouth daily. 06/01/23   Loyola Mast, MD      Allergies    Patient has no known allergies.    Review of Systems   Review of Systems  Cardiovascular:  Positive for chest pain.    Physical Exam Updated Vital Signs BP (!) 78/31   Pulse 84   Temp 98.8 F (37.1 C) (Oral)   Resp 16   Ht 1.727 m (5\' 8" )   Wt 89 kg   SpO2 93%   BMI 29.83 kg/m  Physical Exam Vitals and nursing note reviewed.  Constitutional:      Appearance: He is well-developed. He is ill-appearing.  HENT:     Head: Normocephalic and atraumatic.     Right Ear: External ear normal.     Left Ear: External ear normal.  Eyes:     General: No scleral icterus.       Right eye: No discharge.        Left eye: No discharge.     Conjunctiva/sclera: Conjunctivae normal.  Neck:     Trachea: No tracheal deviation.  Cardiovascular:  Rate and Rhythm: Normal rate and regular rhythm.  Pulmonary:     Effort: Pulmonary effort is normal. No respiratory distress.     Breath sounds: Normal breath sounds. No stridor. No wheezing or rales.  Abdominal:     General: Bowel sounds are normal. There is no distension.     Palpations: Abdomen is soft.     Tenderness: There is no abdominal tenderness. There is no guarding or rebound.  Musculoskeletal:        General: No tenderness or deformity.     Cervical back: Neck supple.  Skin:    General: Skin is warm and dry.     Coloration: Skin is pale.     Findings: No rash.  Neurological:     General: No focal deficit present.     Mental Status: He is alert.     Cranial Nerves: No cranial nerve deficit, dysarthria or facial asymmetry.     Sensory: No sensory deficit.     Motor: No abnormal muscle tone or seizure activity.     Coordination: Coordination normal.  Psychiatric:        Mood and Affect: Mood normal.      ED Results / Procedures / Treatments   Labs (all labs ordered are listed, but only abnormal results are displayed) Labs Reviewed  BASIC METABOLIC PANEL - Abnormal; Notable for the following components:      Result Value   Sodium 134 (*)    CO2 17 (*)    Glucose, Bld 166 (*)    BUN 107 (*)    Creatinine, Ser 2.59 (*)    Calcium 8.8 (*)    GFR, Estimated 26 (*)    All other components within normal limits  CBC - Abnormal; Notable for the following components:   WBC 11.7 (*)    RBC 3.00 (*)    Hemoglobin 6.6 (*)    HCT 21.4 (*)    MCV 71.3 (*)    MCH 22.0 (*)    All other components within normal limits  POC OCCULT BLOOD, ED - Abnormal; Notable for the following components:   Fecal Occult Bld POSITIVE (*)    All other components within normal limits  PREPARE RBC (CROSSMATCH)  ABO/RH  TYPE AND SCREEN  PREPARE RBC (CROSSMATCH)  TROPONIN I (HIGH SENSITIVITY)  TROPONIN I (HIGH SENSITIVITY)    EKG EKG Interpretation Date/Time:  Friday July 17 2023 20:44:44 EST Ventricular Rate:  108 PR Interval:  188 QRS Duration:  90 QT Interval:  324 QTC Calculation: 434 R Axis:   270  Text Interpretation: Sinus tachycardia Anterolateral infarct , age undetermined Abnormal ECG When compared with ECG of 01-Nov-2022 15:39, No significant change since last tracing Confirmed by Linwood Dibbles [phone]) on 07/17/2023 10:12:16 PM  Radiology DG Chest 2 View  Result Date: 07/17/2023 CLINICAL DATA:  Chest pain EXAM: CHEST - 2 VIEW COMPARISON:  12/04/2020 FINDINGS: The heart size and mediastinal contours are within normal limits. Aortic atherosclerosis. Both lungs are clear. The visualized skeletal structures are unremarkable. Mild bronchitic changes. IMPRESSION: No active cardiopulmonary disease. Mild bronchitic changes. Electronically Signed   By: Jasmine Pang M.D.   On: 07/17/2023 22:15    Procedures .Critical Care  Performed by: Linwood Dibbles, MD Authorized by: Linwood Dibbles, MD    Critical care provider statement:    Critical care time (minutes):  45   Critical care was time spent personally by me on the following activities:  Development of treatment plan with patient or  surrogate, discussions with consultants, evaluation of patient's response to treatment, examination of patient, ordering and review of laboratory studies, ordering and review of radiographic studies, ordering and performing treatments and interventions, pulse oximetry, re-evaluation of patient's condition and review of old charts     Medications Ordered in ED Medications  0.9 %  sodium chloride infusion (Manually program via Guardrails IV Fluids) (has no administration in time range)  lactated ringers bolus 500 mL (has no administration in time range)  pantoprazole (PROTONIX) injection 40 mg (has no administration in time range)  0.9 %  sodium chloride infusion (Manually program via Guardrails IV Fluids) (0 mLs Intravenous Stopped 07/17/23 2302)    ED Course/ Medical Decision Making/ A&P Clinical Course as of 07/17/23 2331  Fri Jul 17, 2023  2247 Notified the patient's repeat blood pressure is low.  Blood has been ordered [JK]  2248 Notified patient's blood pressures in the 80s.  Will give 1 unit of emergent release blood [JK]  2312 Blood pressure 84/43.  Blood is infusing at this time [JK]  2322 Case discussed with Dr. Vassie Loll regarding admission [JK]    Clinical Course User Index [JK] Linwood Dibbles, MD                                 Medical Decision Making Problems Addressed: Gastrointestinal hemorrhage, unspecified gastrointestinal hemorrhage type: acute illness or injury that poses a threat to life or bodily functions Hemorrhagic shock [facility]): acute illness or injury that poses a threat to life or bodily functions  Amount and/or Complexity of Data Reviewed Labs: ordered. Decision-making details documented in ED Course. Radiology: ordered and independent interpretation  performed.  Risk Prescription drug management. Decision regarding hospitalization.   Patient presented to the ED with complaints of chest pain.  Patient on arrival noted to be pale.  In the ED patient had a loose bowel movement that was dark black and watery consistent with melena.  EKG without ischemic changes.  Initial troponin normal.  No signs of acute cardiac ischemia at this time.  His hemoglobin however is down to 6.6.  There was 11.7 just 12 days ago.  Patient is Hemoccult positive.  Presentation consistent with acute GI bleeding.  While in the ED patient's blood pressure decreased into the 80s.  Blood had been ordered but I ordered a unit of emergent release blood.  IV Protonix has been ordered.  Will consult with the intensivist service for admission.        Final Clinical Impression(s) / ED Diagnoses Final diagnoses:  Gastrointestinal hemorrhage, unspecified gastrointestinal hemorrhage type  Hemorrhagic shock [facility])    Rx / DC Orders ED Discharge Orders     None         Linwood Dibbles, MD 07/17/23 [phone]

## 2023-07-17 NOTE — Telephone Encounter (Signed)
Called patient, he states that he is still feeling nausea.  He hasn't gotten any Afrin nasal spray or done any of the recommendations by provider.  He did state that he spoke to [facility] and they sent in a prescription for him today and he was waiting on pharmacy to let him know when it is ready.  Advised him that when he did go to the pharmacy to get him some nasal spray.  He will do that. Dm/cma

## 2023-07-17 NOTE — H&P (Signed)
NAME:  Nicolas Ray, MRN:  [id number], DOB:  12/18/1950, LOS: 0 ADMISSION DATE:  07/17/2023, CONSULTATION DATE:  07/17/2023  REFERRING MD:  Nira Conn, EDP, CHIEF COMPLAINT:  UGI bleed   History of Present Illness:  72 year old man BIBEMS for substernal chest pressure for 1 day, worse with walking.  He reported dark-colored stool.  Compliant with aspirin and Plavix, denies OTC NSAIDs.  He had an episode of dark black loose stool and became hypotensive.  Hemoglobin of 6.6, was 11.7 on 11/3.  He was transfused 1 unit PRBC with slight improvement in blood pressure, PCCM consulted Of note, he had an ED visit 11/3 for hematuria and mild bilateral hydronephrosis.  Seen by urology in clinic and urinary retention improved  Pertinent  Medical History  Coronary artery disease  S/p Lat STEMI 04/2015 >> s/p DES to D1 Patient refused CABG >> staged PCI: DES to mid LAD and DES to prox RCA Chronic systolic CHF Ischemic CM ACEi DCd in past due to worsening Creatinine  Echocardiogram 12/2020 EF 35-40% Acute pulmonary edema March 2024, LHC 12/2022 patent stents Diabetes mellitus  Chronic kidney disease 3b Hypertension  Tobacco use R hemidiaphragm paralysis Mild OSA  Significant Hospital Events: Including procedures, antibiotic start and stop dates in addition to other pertinent events     Interim History / Subjective:  Complains of chest pressure, nonradiating  Objective   Blood pressure (!) 91/29, pulse 92, temperature 98.8 F (37.1 C), temperature source Oral, resp. rate 16, height 5\' 8"  (1.727 m), weight 89 kg, SpO2 94%.       No intake or output data in the 24 hours ending 07/17/23 2327 Filed Weights   07/17/23 2057  Weight: 89 kg    Examination: General: Elderly man, lying supine, no distress HENT: Mild pallor, no icterus, no JVD Lungs: Clear breath sounds bilateral, no accessory muscle use Cardiovascular: S1-S2 regular Abdomen: Soft, nontender, no hepatosplenomegaly Extremities: No  edema, no deformity Neuro: Alert, interactive, nonfocal  Labs show sodium 134, BUN/creatinine 107/2.6, troponin flat, mild leukocytosis 11.7, hemoglobin 6.6, normal platelets  EKG shows T wave inversions lateral leads and poor R wave progression consistent with old anterolateral MI, unchanged from prior, 1 mm ST depression in inferior leads  Resolved Hospital Problem list     Assessment & Plan:  His substernal chest pressure likely indicates angina equivalent, this is in the setting of upper GI bleed with melena and severe anemia  Hemorrhagic shock Acute blood loss anemia  -Transfuse rapidly, 2 units ordered, post unit given rapidly with improvement in blood pressure, goal hemoglobin 8 and above in the setting of angina -Check hemoglobin every 4 hours -GI consult, will likely need endoscopy when chest pressure resolved -N.p.o. -IV Protonix infusion  CAD Ischemic cardiomyopathy -Hold aspirin, Plavix -Hold metoprolol and valsartan due to hypotension  Can resume Synthroid and gabapentin BPH -resume Flomax  Best Practice (right click and "Reselect all SmartList Selections" daily)   Diet/type: NPO DVT prophylaxis: SCD GI prophylaxis: PPI Lines: N/A Foley:  N/A Code Status:  full code Last date of multidisciplinary goals of care discussion [NA]  Labs   CBC: Recent Labs  Lab 07/17/23 2104  WBC 11.7*  HGB 6.6*  HCT 21.4*  MCV 71.3*  PLT 205    Basic Metabolic Panel: Recent Labs  Lab 07/17/23 2104  NA 134*  K 4.9  CL 107  CO2 17*  GLUCOSE 166*  BUN 107*  CREATININE 2.59*  CALCIUM 8.8*   GFR: Estimated Creatinine Clearance:  27.9 mL/min (A) (by C-G formula based on SCr of 2.59 mg/dL (H)). Recent Labs  Lab 07/17/23 2104  WBC 11.7*    Liver Function Tests: No results for input(s): "AST", "ALT", "ALKPHOS", "BILITOT", "PROT", "ALBUMIN" in the last 168 hours. No results for input(s): "LIPASE", "AMYLASE" in the last 168 hours. No results for input(s):  "AMMONIA" in the last 168 hours.  ABG    Component Value Date/Time   PHART 7.360 12/09/2022 1111   PCO2ART 44.8 12/09/2022 1111   PO2ART 56 (L) 12/09/2022 1111   HCO3 25.3 12/09/2022 1111   TCO2 27 12/09/2022 1111   ACIDBASEDEF 1.0 12/09/2022 1111   O2SAT 87 12/09/2022 1111     Coagulation Profile: No results for input(s): "INR", "PROTIME" in the last 168 hours.  Cardiac Enzymes: No results for input(s): "CKTOTAL", "CKMB", "CKMBINDEX", "TROPONINI" in the last 168 hours.  HbA1C: Hgb A1c MFr Bld  Date/Time Value Ref Range Status  12/02/2021 11:27 AM 8.6 (H) 4.6 - 6.5 % Final    Comment:    Glycemic Control Guidelines for People with Diabetes:Non Diabetic:  <6%Goal of Therapy: <7%Additional Action Suggested:  >8%   08/08/2021 11:41 AM 8.6 (H) 4.6 - 6.5 % Final    Comment:    Glycemic Control Guidelines for People with Diabetes:Non Diabetic:  <6%Goal of Therapy: <7%Additional Action Suggested:  >8%     CBG: No results for input(s): "GLUCAP" in the last 168 hours.  Review of Systems:   Constitutional: negative for anorexia, fevers and sweats  Eyes: negative for irritation, redness and visual disturbance  Ears, nose, mouth, throat, and face: negative for earaches, epistaxis, nasal congestion and sore throat  Respiratory: negative for cough, dyspnea on exertion, sputum and wheezing  Cardiovascular: negative for  dyspnea, lower extremity edema, orthopnea, palpitations and syncope  Gastrointestinal: negative for abdominal pain, constipation, diarrhea,  nausea and vomiting  Genitourinary:negative for dysuria, frequency and hematuria  Hematologic/lymphatic: negative for  easy bruising and lymphadenopathy  Musculoskeletal:negative for arthralgias, muscle weakness and stiff joints  Neurological: negative for coordination problems, gait problems, headaches and weakness  Endocrine: negative for diabetic symptoms including polydipsia, polyuria and weight loss   Past Medical History:   He,  has a past medical history of Acute MI, lateral wall, initial episode of care (HCC) (04/18/2015), CAD (coronary artery disease) (04/18/2015), Depression, DM2 (diabetes mellitus, type 2) (HCC), HTN (hypertension) (02/08/2013), Hypothyroidism, Ischemic cardiomyopathy, OSA (obstructive sleep apnea) (09/21/2018), and ST elevation myocardial infarction (STEMI) of lateral wall (HCC) (04/18/2015).   Surgical History:   Past Surgical History:  Procedure Laterality Date   CARDIAC CATHETERIZATION N/A 04/18/2015   Procedure: Left Heart Cath and Coronary Angiography;  Surgeon: Tonny Bollman, MD;  Location: [facility] INVASIVE CV LAB;  Service: Cardiovascular;  Laterality: N/A;   CARDIAC CATHETERIZATION N/A 04/18/2015   Procedure: Coronary Stent Intervention;  Surgeon: Tonny Bollman, MD;  Location: [facility] INVASIVE CV LAB;  Service: Cardiovascular;  Laterality: N/A;   CARDIAC CATHETERIZATION N/A 04/20/2015   Procedure: Coronary Stent Intervention;  Surgeon: Kathleene Hazel, MD;  Location: [facility] INVASIVE CV LAB;  Service: Cardiovascular;  Laterality: N/A;   CORONARY STENT PLACEMENT     RIGHT/LEFT HEART CATH AND CORONARY ANGIOGRAPHY N/A 12/09/2022   Procedure: RIGHT/LEFT HEART CATH AND CORONARY ANGIOGRAPHY;  Surgeon: Tonny Bollman, MD;  Location: [facility] INVASIVE CV LAB;  Service: Cardiovascular;  Laterality: N/A;     Social History:   reports that he has been smoking cigarettes. He has never used smokeless tobacco. He reports current alcohol  use. He reports that he does not use drugs.   Family History:  His family history includes Cancer in his father; Heart disease in his mother; Stroke in his brother; Thyroid disease in his sister. There is no history of Colon cancer or Diabetes.   Allergies No Known Allergies   Home Medications  Prior to Admission medications   Medication Sig Start Date End Date Taking? Authorizing Provider  aspirin EC 81 MG tablet Take 81 mg by mouth every other day. In the morning. Swallow  whole.    [provider]  Cholecalciferol (VITAMIN D3 PO) Take 1 capsule by mouth in the morning.    [provider]  clopidogrel (PLAVIX) 75 MG tablet TAKE 1 TABLET(75 MG) BY MOUTH DAILY 12/08/22   Tonny Bollman, MD  COENZYME Q10 PO Take 1 capsule by mouth in the morning. Per patient taking 200 mg daily    [provider]  gabapentin (NEURONTIN) 300 MG capsule Take 1 capsule (300 mg total) by mouth at bedtime. 06/01/23   Loyola Mast, MD  levothyroxine (SYNTHROID) 150 MCG tablet Take 1 tablet (150 mcg total) by mouth daily before breakfast. 06/01/23   Loyola Mast, MD  metoprolol succinate (TOPROL-XL) 25 MG 24 hr tablet Take 1 tablet (25 mg total) by mouth in the morning. 06/24/23   Loyola Mast, MD  nitroGLYCERIN (NITROSTAT) 0.4 MG SL tablet Dissolve 1 tablet under the tongue every 5 minutes as needed for chest pain. Max of 3 doses, then 911. 03/12/23   Tonny Bollman, MD  rosuvastatin (CRESTOR) 10 MG tablet TAKE 1 TABLET(10 MG) BY MOUTH DAILY 12/16/22   Tonny Bollman, MD  tamsulosin (FLOMAX) 0.4 MG CAPS capsule Take 0.4 mg by mouth daily. 07/07/23   [provider]  traZODone (DESYREL) 50 MG tablet Take 0.5-1 tablets (25-50 mg total) by mouth at bedtime as needed for sleep. 06/01/23   Loyola Mast, MD  valsartan (DIOVAN) 80 MG tablet Take 1 tablet (80 mg total) by mouth daily. 06/01/23   Loyola Mast, MD     Critical care time: 62 m       Cyril Mourning MD. [facility]. [facility] Pager : [phone]  If no response to pager , please call [phone] until 7 pm After 7:00 pm call Elink  [phone]   07/17/2023

## 2023-07-17 NOTE — ED Triage Notes (Signed)
Pt presents via EMS c/o chest pain since yesterday, reports chest pressure. Reports dizziness, N/V, SOB. Reports chest pain worse with cough. Given 324 ASA by EMS PTA.

## 2023-07-17 NOTE — Telephone Encounter (Signed)
Pt would like for you to call him . Pt said he is not feeling any better since he was here a couple days ago

## 2023-07-17 NOTE — ED Notes (Addendum)
Per Dr Lynelle Doctor, increase blood rate per BP. May need to rapid infuse.

## 2023-07-17 NOTE — ED Notes (Signed)
Primary RN made aware of hgb 6.6. Dr Lynelle Doctor notified.

## 2023-07-17 NOTE — ED Notes (Signed)
Patient transported to X-ray 

## 2023-07-18 ENCOUNTER — Encounter (HOSPITAL_COMMUNITY)
Admission: EM | Disposition: A | Discharge status: Home / Self Care | Payer: Self-pay | Source: Home / Self Care | Attending: Pulmonary Disease

## 2023-07-18 ENCOUNTER — Inpatient Hospital Stay (HOSPITAL_COMMUNITY): Payer: 59 | Admitting: Anesthesiology

## 2023-07-18 ENCOUNTER — Encounter (HOSPITAL_COMMUNITY): Payer: Self-pay | Admitting: Pulmonary Disease

## 2023-07-18 DIAGNOSIS — K3189 Other diseases of stomach and duodenum: Secondary | ICD-10-CM | POA: Diagnosis not present

## 2023-07-18 DIAGNOSIS — R578 Other shock: Secondary | ICD-10-CM | POA: Diagnosis not present

## 2023-07-18 DIAGNOSIS — K2970 Gastritis, unspecified, without bleeding: Secondary | ICD-10-CM

## 2023-07-18 DIAGNOSIS — I2510 Atherosclerotic heart disease of native coronary artery without angina pectoris: Secondary | ICD-10-CM | POA: Diagnosis not present

## 2023-07-18 DIAGNOSIS — K2100 Gastro-esophageal reflux disease with esophagitis, without bleeding: Secondary | ICD-10-CM | POA: Diagnosis not present

## 2023-07-18 DIAGNOSIS — I25810 Atherosclerosis of coronary artery bypass graft(s) without angina pectoris: Secondary | ICD-10-CM | POA: Diagnosis not present

## 2023-07-18 DIAGNOSIS — Z7902 Long term (current) use of antithrombotics/antiplatelets: Secondary | ICD-10-CM

## 2023-07-18 DIAGNOSIS — K922 Gastrointestinal hemorrhage, unspecified: Secondary | ICD-10-CM | POA: Diagnosis not present

## 2023-07-18 DIAGNOSIS — I130 Hypertensive heart and chronic kidney disease with heart failure and stage 1 through stage 4 chronic kidney disease, or unspecified chronic kidney disease: Secondary | ICD-10-CM | POA: Diagnosis not present

## 2023-07-18 DIAGNOSIS — K269 Duodenal ulcer, unspecified as acute or chronic, without hemorrhage or perforation: Secondary | ICD-10-CM

## 2023-07-18 DIAGNOSIS — N184 Chronic kidney disease, stage 4 (severe): Secondary | ICD-10-CM | POA: Diagnosis not present

## 2023-07-18 DIAGNOSIS — I5022 Chronic systolic (congestive) heart failure: Secondary | ICD-10-CM | POA: Diagnosis not present

## 2023-07-18 DIAGNOSIS — K921 Melena: Secondary | ICD-10-CM | POA: Diagnosis not present

## 2023-07-18 DIAGNOSIS — K264 Chronic or unspecified duodenal ulcer with hemorrhage: Secondary | ICD-10-CM | POA: Diagnosis present

## 2023-07-18 HISTORY — PX: BIOPSY: SHX5522

## 2023-07-18 HISTORY — PX: ESOPHAGOGASTRODUODENOSCOPY: SHX5428

## 2023-07-18 LAB — HEMOGLOBIN AND HEMATOCRIT, BLOOD
HCT: 25.5 % — ABNORMAL LOW (ref 39.0–52.0)
HCT: 23.5 % — ABNORMAL LOW (ref 39.0–52.0)
HCT: 25 % — ABNORMAL LOW (ref 39.0–52.0)
Hemoglobin: 8.1 g/dL — ABNORMAL LOW (ref 13.0–17.0)
Hemoglobin: 7.5 g/dL — ABNORMAL LOW (ref 13.0–17.0)
Hemoglobin: 8 g/dL — ABNORMAL LOW (ref 13.0–17.0)

## 2023-07-18 LAB — CBC
HCT: 24.8 % — ABNORMAL LOW (ref 39.0–52.0)
HCT: 22.8 % — ABNORMAL LOW (ref 39.0–52.0)
Hemoglobin: 7.9 g/dL — ABNORMAL LOW (ref 13.0–17.0)
Hemoglobin: 7.3 g/dL — ABNORMAL LOW (ref 13.0–17.0)
MCH: 24.3 pg — ABNORMAL LOW (ref 26.0–34.0)
MCH: 23.9 pg — ABNORMAL LOW (ref 26.0–34.0)
MCHC: 31.9 g/dL (ref 30.0–36.0)
MCHC: 32 g/dL (ref 30.0–36.0)
MCV: 76.3 fL — ABNORMAL LOW (ref 80.0–100.0)
MCV: 74.8 fL — ABNORMAL LOW (ref 80.0–100.0)
Platelets: 159 10*3/uL (ref 150–400)
Platelets: 155 10*3/uL (ref 150–400)
RBC: 3.25 MIL/uL — ABNORMAL LOW (ref 4.22–5.81)
RBC: 3.05 MIL/uL — ABNORMAL LOW (ref 4.22–5.81)
RDW: 16.7 % — ABNORMAL HIGH (ref 11.5–15.5)
RDW: 16.6 % — ABNORMAL HIGH (ref 11.5–15.5)
WBC: 11.8 10*3/uL — ABNORMAL HIGH (ref 4.0–10.5)
WBC: 9.3 10*3/uL (ref 4.0–10.5)
nRBC: 0 % (ref 0.0–0.2)
nRBC: 0 % (ref 0.0–0.2)

## 2023-07-18 LAB — GLUCOSE, CAPILLARY
Glucose-Capillary: 117 mg/dL — ABNORMAL HIGH (ref 70–99)
Glucose-Capillary: 124 mg/dL — ABNORMAL HIGH (ref 70–99)
Glucose-Capillary: 132 mg/dL — ABNORMAL HIGH (ref 70–99)
Glucose-Capillary: 109 mg/dL — ABNORMAL HIGH (ref 70–99)

## 2023-07-18 LAB — BASIC METABOLIC PANEL
Anion gap: 7 (ref 5–15)
BUN: 117 mg/dL — ABNORMAL HIGH (ref 8–23)
CO2: 18 mmol/L — ABNORMAL LOW (ref 22–32)
Calcium: 8.1 mg/dL — ABNORMAL LOW (ref 8.9–10.3)
Chloride: 110 mmol/L (ref 98–111)
Creatinine, Ser: 2.88 mg/dL — ABNORMAL HIGH (ref 0.61–1.24)
GFR, Estimated: 22 mL/min — ABNORMAL LOW (ref >60)
Glucose, Bld: 133 mg/dL — ABNORMAL HIGH (ref 70–99)
Potassium: 4.9 mmol/L (ref 3.5–5.1)
Sodium: 135 mmol/L (ref 135–145)

## 2023-07-18 LAB — MRSA NEXT GEN BY PCR, NASAL: MRSA by PCR Next Gen: NOT DETECTED

## 2023-07-18 LAB — CBG MONITORING, ED
Glucose-Capillary: 156 mg/dL — ABNORMAL HIGH (ref 70–99)
Glucose-Capillary: 130 mg/dL — ABNORMAL HIGH (ref 70–99)

## 2023-07-18 SURGERY — EGD (ESOPHAGOGASTRODUODENOSCOPY)
Anesthesia: Monitor Anesthesia Care

## 2023-07-18 MED ORDER — PHENYLEPHRINE 80 MCG/ML (10ML) SYRINGE FOR IV PUSH (FOR BLOOD PRESSURE SUPPORT)
PREFILLED_SYRINGE | INTRAVENOUS | Status: DC | PRN
  Administered 2023-07-18: 80 ug via INTRAVENOUS
  Administered 2023-07-18: 120 ug via INTRAVENOUS
  Administered 2023-07-18: 80 ug via INTRAVENOUS

## 2023-07-18 MED ORDER — PROPOFOL 500 MG/50ML IV EMUL
INTRAVENOUS | Status: DC | PRN
  Administered 2023-07-18: 125 ug/kg/min via INTRAVENOUS

## 2023-07-18 MED ORDER — METOCLOPRAMIDE HCL 5 MG/ML IJ SOLN
10.0000 mg | Freq: Once | INTRAMUSCULAR | Status: AC
  Administered 2023-07-18: 10 mg via INTRAVENOUS
  Filled 2023-07-18: qty 2

## 2023-07-18 MED ORDER — PANTOPRAZOLE SODIUM 40 MG IV SOLR
40.0000 mg | Freq: Once | INTRAVENOUS | Status: AC
  Administered 2023-07-18: 40 mg via INTRAVENOUS
  Filled 2023-07-18: qty 10

## 2023-07-18 MED ORDER — SODIUM CHLORIDE 0.9 % IV SOLN: INTRAVENOUS | Status: DC

## 2023-07-18 MED ORDER — PANTOPRAZOLE SODIUM 40 MG IV SOLR
40.0000 mg | Freq: Two times a day (BID) | INTRAVENOUS | Status: DC
  Administered 2023-07-18 – 2023-07-20 (×5): 40 mg via INTRAVENOUS
  Filled 2023-07-18 (×5): qty 10

## 2023-07-18 MED ORDER — CHLORHEXIDINE GLUCONATE CLOTH 2 % EX PADS
6.0000 | MEDICATED_PAD | Freq: Every day | CUTANEOUS | Status: DC
  Administered 2023-07-18: 6 via TOPICAL

## 2023-07-18 MED ORDER — LIDOCAINE HCL (CARDIAC) PF 100 MG/5ML IV SOSY
PREFILLED_SYRINGE | INTRAVENOUS | Status: DC | PRN
  Administered 2023-07-18: 100 mg via INTRAVENOUS

## 2023-07-18 MED ORDER — SUCRALFATE 1 GM/10ML PO SUSP
1.0000 g | Freq: Three times a day (TID) | ORAL | Status: DC
  Administered 2023-07-18 – 2023-07-20 (×7): 1 g via ORAL
  Filled 2023-07-18 (×9): qty 10

## 2023-07-18 MED ORDER — PROPOFOL 10 MG/ML IV BOLUS
INTRAVENOUS | Status: DC | PRN
  Administered 2023-07-18: 20 mg via INTRAVENOUS
  Administered 2023-07-18: 50 mg via INTRAVENOUS
  Administered 2023-07-18: 30 mg via INTRAVENOUS

## 2023-07-18 MED ORDER — PANTOPRAZOLE SODIUM 40 MG IV SOLR: 40.0000 mg | Freq: Two times a day (BID) | INTRAVENOUS | Status: DC

## 2023-07-18 MED ORDER — PANTOPRAZOLE SODIUM 40 MG IV SOLR: 40.0000 mg | INTRAVENOUS | Status: DC

## 2023-07-18 NOTE — Progress Notes (Signed)
eLink Physician-Brief Progress Note Patient Name: Nicolas Ray DOB: 03-Aug-1951 MRN: [id number]   Date of Service  07/18/2023  HPI/Events of Note  Patient admitted with GI bleeding, hemorrhagic shock, and acute blood loss anemia. Latest hemoglobin is 8.1 gm / dl (up from 6.6 gm / dl) following transfusion of PRBC.  eICU Interventions  New Patient Evaluation.        [name] [name] 07/18/2023, 6:40 AM

## 2023-07-18 NOTE — Progress Notes (Signed)
Ultrasound guided placed with tip marked for vasopressor use.

## 2023-07-18 NOTE — Op Note (Signed)
[facility] Patient Name: Nicolas Ray Procedure Date : 07/18/2023 MRN: [id number] Attending MD: Dub Amis. Tomasa Rand , MD, [id number] Date of Birth: Jun 27, 1951 CSN: [id number] Age: 72 Admit Type: Inpatient Procedure:                Upper GI endoscopy Indications:              Melena, Patient presented with melena and                            hemorrhagic shock, responded well to resuscitation. Providers:                Dub Amis. Tomasa Rand, MD, Eliberto Ivory, RN, Rozetta Nunnery, Technician Referring MD:              Medicines:                Monitored Anesthesia Care Complications:            No immediate complications. Estimated Blood Loss:     Estimated blood loss was minimal. Procedure:                Pre-Anesthesia Assessment:                           - Prior to the procedure, a History and Physical                            was performed, and patient medications and                            allergies were reviewed. The patient's tolerance of                            previous anesthesia was also reviewed. The risks                            and benefits of the procedure and the sedation                            options and risks were discussed with the patient.                            All questions were answered, and informed consent                            was obtained. Prior Anticoagulants: The patient has                            taken Plavix (clopidogrel), last dose was 1 day                            prior to procedure. ASA Grade Assessment: IV - A  patient with severe systemic disease that is a                            constant threat to life. After reviewing the risks                            and benefits, the patient was deemed in                            satisfactory condition to undergo the procedure.                           After obtaining informed consent, the endoscope was                             passed under direct vision. Throughout the                            procedure, the patient's blood pressure, pulse, and                            oxygen saturations were monitored continuously. The                            [id number]-[id number] ([id number]) Olympus endoscope was introduced                            through the mouth, and advanced to the second part                            of duodenum. The upper GI endoscopy was                            accomplished without difficulty. The patient                            tolerated the procedure well. Scope In: Scope Out: Findings:      LA Grade C (one or more mucosal breaks continuous between tops of 2 or       more mucosal folds, less than 75% circumference) esophagitis with no       bleeding was found at the gastroesophageal junction.      The exam of the esophagus was otherwise normal.      A few dispersed diminutive erosions with no bleeding and no stigmata of       recent bleeding were found in the gastric antrum. Biopsies were taken       with a cold forceps for Helicobacter pylori testing. Estimated blood       loss was minimal.      A 4 cm hiatal hernia was present.      The exam of the stomach was otherwise normal.      Two non-bleeding duodenal ulcers were found in the duodenal bulb. The       largest lesion was superficial and about 8 mm in largest dimension. The  smaller ulcer was cratered and had a small focus of pigmented material.      Diffuse moderately erythematous and friable mucosa without active       bleeding and with no stigmata of bleeding was found in the duodenal bulb       and in the second portion of the duodenum.      The exam of the duodenum was otherwise normal. Impression:               - LA Grade C reflux esophagitis with no bleeding.                           - Erosive gastropathy with no bleeding and no                            stigmata of recent bleeding. Biopsied.                            - 4 cm hiatal hernia.                           - Non-bleeding duodenal ulcers with pigmented                            material. THis is the source of the patient's upper                            GI bleed. Because of the small size of the                            pigmented spot and rapid clinical improvement, the                            decision was made to manipulate the ulcer.                           - Erythematous duodenopathy. Moderate Sedation:      N/A Recommendation:           - Return patient to ICU for ongoing care.                           - Clear liquid diet today.                           - Await pathology results.                           - Use Protonix (pantoprazole) 40 mg IV twice daily.                           - Use sucralfate suspension 1 gram PO QID.                           - Avoid NSAIDs indefinitely                           -  Would hold aspirin and Plavix for another 2-3                            days if reasonable risk from cardiology standpoint.                           - Will follow up with patient tomorrow                           - Would recommend repeat EGD if patient                            demonstrates recurrent evidence of bleeding. Procedure Code(s):        --- Professional ---                           [phone], Esophagogastroduodenoscopy, flexible,                            transoral; with biopsy, single or multiple Diagnosis Code(s):        --- Professional ---                           K21.00, Gastro-esophageal reflux disease with                            esophagitis, without bleeding                           K31.89, Other diseases of stomach and duodenum                           K26.9, Duodenal ulcer, unspecified as acute or                            chronic, without hemorrhage or perforation                           K92.1, Melena (includes Hematochezia) CPT copyright 2022 American Medical Association. All  rights reserved. The codes documented in this report are preliminary and upon coder review may  be revised to meet current compliance requirements. Lymon Kidney E. Tomasa Rand, MD 07/18/2023 12:51:39 PM This report has been signed electronically. Number of Addenda: 0

## 2023-07-18 NOTE — Anesthesia Postprocedure Evaluation (Signed)
Anesthesia Post Note  Patient: Nicolas Ray  Procedure(s) Performed: ESOPHAGOGASTRODUODENOSCOPY (EGD) BIOPSY     Patient location during evaluation: Endoscopy Anesthesia Type: MAC Level of consciousness: awake Pain management: pain level controlled Vital Signs Assessment: post-procedure vital signs reviewed and stable Respiratory status: spontaneous breathing, nonlabored ventilation and respiratory function stable Cardiovascular status: blood pressure returned to baseline and stable Postop Assessment: no apparent nausea or vomiting Anesthetic complications: no   No notable events documented.  Last Vitals:  Vitals:   07/18/23 1300 07/18/23 1400  BP: (!) 111/57 (!) 94/56  Pulse: 82 76  Resp: 16 19  Temp:    SpO2: 98% 97%    Last Pain:  Vitals:   07/18/23 1245  TempSrc: Temporal  PainSc: 0-No pain                 Valleri Hendricksen P Tarrence Enck

## 2023-07-18 NOTE — Transfer of Care (Signed)
Immediate Anesthesia Transfer of Care Note  Patient: Nicolas Ray  Procedure(s) Performed: ESOPHAGOGASTRODUODENOSCOPY (EGD) BIOPSY  Patient Location: PACU  Anesthesia Type:MAC  Level of Consciousness: awake, drowsy, and patient cooperative  Airway & Oxygen Therapy: Patient Spontanous Breathing and Patient connected to nasal cannula oxygen  Post-op Assessment: Report given to RN and Post -op Vital signs reviewed and stable  Post vital signs: Reviewed and stable  Last Vitals:  Vitals Value Taken Time  BP 114/68 07/18/23 1241  Temp    Pulse 96 07/18/23 1243  Resp 33 07/18/23 1243  SpO2 98 % 07/18/23 1243  Vitals shown include unfiled device data.  Last Pain:  Vitals:   07/18/23 1124  TempSrc: Temporal  PainSc: 0-No pain         Complications: No notable events documented.

## 2023-07-18 NOTE — Progress Notes (Signed)
NAME:  Nicolas Ray, MRN:  [id number], DOB:  1951/04/19, LOS: 1 ADMISSION DATE:  07/17/2023, CONSULTATION DATE:  07/18/2023  REFERRING MD:  Nira Conn, EDP, CHIEF COMPLAINT:  UGI bleed   History of Present Illness:   72 year old man BIBEMS for substernal chest pressure for 1 day, worse with walking.  He reported dark-colored stool.  Compliant with aspirin and Plavix, denies OTC NSAIDs.  He had an episode of dark black loose stool and became hypotensive.  Hemoglobin of 6.6, was 11.7 on 11/3.  He was transfused 1 unit PRBC with slight improvement in blood pressure, PCCM consulted Of note, he had an ED visit 11/3 for hematuria and mild bilateral hydronephrosis.  Seen by urology in clinic and urinary retention improved  Pertinent  Medical History  Coronary artery disease  S/p Lat STEMI 04/2015 >> s/p DES to D1 Patient refused CABG >> staged PCI: DES to mid LAD and DES to prox RCA Chronic systolic CHF Ischemic CM ACEi DCd in past due to worsening Creatinine  Echocardiogram 12/2020 EF 35-40% Acute pulmonary edema March 2024, LHC 12/2022 patent stents Diabetes mellitus  Chronic kidney disease 3b Hypertension  Tobacco use R hemidiaphragm paralysis Mild OSA  Significant Hospital Events: Including procedures, antibiotic start and stop dates in addition to other pertinent events   11/15 Admit with GIB  Interim History / Subjective:   No complaints today  Objective   Blood pressure (!) 95/52, pulse 83, temperature 98.4 F (36.9 C), temperature source Oral, resp. rate 19, height 5\' 8"  (1.727 m), weight 87.8 kg, SpO2 97%.        Intake/Output Summary (Last 24 hours) at 07/18/2023 0911 Last data filed at 07/18/2023 0250 Gross per 24 hour  Intake 40 ml  Output --  Net 40 ml   Filed Weights   07/17/23 2057 07/18/23 6237  Weight: 89 kg 87.8 kg    Examination: Gen:      No acute distress HEENT:  EOMI, sclera anicteric Neck:     No masses; no thyromegaly Lungs:    Clear to auscultation  bilaterally; normal respiratory effort CV:         Regular rate and rhythm; no murmurs Abd:      + bowel sounds; soft, non-tender; no palpable masses, no distension Ext:    No edema; adequate peripheral perfusion Skin:      Warm and dry; no rash Neuro: alert and oriented x 3 Psych: normal mood and affect   Lab/imaging reviewed Significant for BUN/creatinine-117/2.88 WBC 11.8, hemoglobin 7.9, platelets 159   Resolved Hospital Problem list     Assessment & Plan:  His substernal chest pressure likely indicates angina equivalent, this is in the setting of upper GI bleed with melena and severe anemia  Hemorrhagic shock Acute blood loss anemia S/p 3 units PRBC Follow CBC Discussed with GI.  Will go for EGD today Keep n.p.o. Continue Protonix.  CAD. Ischemic cardiomyopathy Came in with chest pressure but EKG and troponins unremarkable. -Hold aspirin, Plavix -Hold metoprolol and valsartan due to hypotension -  Can resume Synthroid and gabapentin when able to take p.o. BPH -resume Flomax when able to take p.o.  Best Practice (right click and "Reselect all SmartList Selections" daily)   Diet/type: NPO DVT prophylaxis: SCD GI prophylaxis: PPI Lines: N/A Foley:  N/A Code Status:  full code Last date of multidisciplinary goals of care discussion [NA]  Critical care time:   Chilton Greathouse MD [facility] See Amion for pager  If no response to pager , please call [phone] until 7pm After 7:00 pm call Elink  [phone] 07/18/2023, 9:55 AM

## 2023-07-18 NOTE — Consult Note (Signed)
Consultation Note   Referring Provider:  PCCM PCP: Loyola Mast, MD Primary Gastroenterologist: Yancey Flemings, MD Reason for Consultation: GI bleed  DOA: 07/17/2023         Hospital Day: 2   ASSESSMENT    Brief Narrative:  72 y.o. 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  Presenting hemoglobin 6.6 Hemoglobin minimally improved to 7.5 after 3 units of blood   CAD / STEMI / DES on plavix and asa.  Last dose of Plavix was 11/15  Principal Problem:   GI bleed     PLAN:   --EGD this morning. Schedule for EGD. The risks and benefits of EGD  were discussed with the patient who agrees to proceed. Seems stable enough to be done in Endoscopy unit and PCCM agrees.  --Will give Reglan x 1 to clear stomach --Getting Pantoprazole 40 mg IV BID.  --Further recommendations to follow EGD  HPI   Patient presented to the ED yesterday with chest pain, nausea, and shortness of breath.   Patient is on aspirin and Plavix . He has been undergoing evaluation of hematuria by Urology.  A few days ago he was started on a couple of medications prescribed by Urology. Subsequently developed nausea and had a dark BM. Called Urology and medication was changed to something else.   In ED he had a loose black BM.  He was hypotensive with a hemoglobin of 6.6. No abdominal pain. Other than daily asa he doesn't take NSAIDs. No hx of PUD.     Notable labs / Imaging / Events this admission  :  BUN 117, creatinine 2.8 Hemoglobin 6.6 up to only 7.5 after 3 units of blood   Previous GI Evaluations   Screening colonoscopy  Feb 2015 Three sessile polyps removed ranging from 2- 7 mm   Labs and Imaging: Recent Labs     07/17/23 2104 07/18/23 0222 07/18/23 0436 07/18/23 0813  WBC 11.7*  --  11.8*  --   HGB 6.6* 8.1* 7.9* 7.5*  HCT 21.4* 25.5* 24.8* 23.5*  PLT 205  --  159  --    Recent Labs    07/17/23 2104 07/18/23 0436  NA 134* 135  K 4.9 4.9  CL 107 110  CO2 17* 18*  GLUCOSE 166* 133*  BUN 107* 117*  CREATININE 2.59* 2.88*  CALCIUM 8.8* 8.1*   No results for input(s): "PROT", "ALBUMIN", "AST", "ALT", "ALKPHOS", "BILITOT", "BILIDIR", "IBILI" in the last 72 hours. No results for input(s): "HEPBSAG", "HCVAB", "HEPAIGM", "HEPBIGM" in the last 72 hours. No results for input(s): "LABPROT", "INR" in the last 72 hours.    Past Medical History:  Diagnosis Date   Acute MI, lateral wall, initial episode of care (HCC) 04/18/2015   CAD (coronary artery disease) 04/18/2015   a. Lat STEMI 8/16 - LHC:  pLAD 90, mLAD 50, D1 100, dLCx 90, OM1 80, pRCA 75, EF 35-45% >> DES to D1;  b. staged PCI 04/20/15 - DES to mid LAD and DES to prox RCA  Depression    DM2 (diabetes mellitus, type 2) (HCC)    HTN (hypertension) 02/08/2013   Renal Artery [name] 9/16: Bilateral RAS 1-59%, SMA >70%   Hypothyroidism    Ischemic cardiomyopathy    a. Echo 8/16:  Mild LVH, EF 40-45%, mid-apical anterolateral and apical AK, grade 1 diastolic dysfunction, trivial effusion   OSA (obstructive sleep apnea) 09/21/2018   ST elevation myocardial infarction (STEMI) of lateral wall (HCC) 04/18/2015    Past Surgical History:  Procedure Laterality Date   CARDIAC CATHETERIZATION N/A 04/18/2015   Procedure: Left Heart Cath and Coronary Angiography;  Surgeon: Tonny Bollman, MD;  Location: [facility] INVASIVE CV LAB;  Service: Cardiovascular;  Laterality: N/A;   CARDIAC CATHETERIZATION N/A 04/18/2015   Procedure: Coronary Stent Intervention;  Surgeon: Tonny Bollman, MD;  Location: [facility] INVASIVE CV LAB;  Service: Cardiovascular;  Laterality: N/A;   CARDIAC CATHETERIZATION N/A 04/20/2015   Procedure: Coronary Stent Intervention;  Surgeon:  Kathleene Hazel, MD;  Location: [facility] INVASIVE CV LAB;  Service: Cardiovascular;  Laterality: N/A;   CORONARY STENT PLACEMENT     RIGHT/LEFT HEART CATH AND CORONARY ANGIOGRAPHY N/A 12/09/2022   Procedure: RIGHT/LEFT HEART CATH AND CORONARY ANGIOGRAPHY;  Surgeon: Tonny Bollman, MD;  Location: [facility] INVASIVE CV LAB;  Service: Cardiovascular;  Laterality: N/A;    Family History  Problem Relation Age of Onset   Heart disease Mother    Cancer Father        Neck   Thyroid disease Sister    Stroke Brother    Colon cancer Neg Hx    Diabetes Neg Hx     Prior to Admission medications   Medication Sig Start Date End Date Taking? Authorizing Provider  alfuzosin (UROXATRAL) 10 MG 24 hr tablet Take 10 mg by mouth daily. 07/17/23  Yes [provider]  aspirin EC 81 MG tablet Take 81 mg by mouth every other day. In the morning. Swallow whole.   Yes [provider]  clopidogrel (PLAVIX) 75 MG tablet TAKE 1 TABLET(75 MG) BY MOUTH DAILY 12/08/22  Yes Tonny Bollman, MD  gabapentin (NEURONTIN) 300 MG capsule Take 1 capsule (300 mg total) by mouth at bedtime. 06/01/23  Yes Loyola Mast, MD  levothyroxine (SYNTHROID) 150 MCG tablet Take 1 tablet (150 mcg total) by mouth daily before breakfast. 06/01/23  Yes Loyola Mast, MD  metoprolol succinate (TOPROL-XL) 25 MG 24 hr tablet Take 1 tablet (25 mg total) by mouth in the morning. 06/24/23  Yes Loyola Mast, MD  nitroGLYCERIN (NITROSTAT) 0.4 MG SL tablet Dissolve 1 tablet under the tongue every 5 minutes as needed for chest pain. Max of 3 doses, then 911. 03/12/23  Yes Tonny Bollman, MD  rosuvastatin (CRESTOR) 10 MG tablet TAKE 1 TABLET(10 MG) BY MOUTH DAILY 12/16/22  Yes Tonny Bollman, MD  valsartan (DIOVAN) 80 MG tablet Take 1 tablet (80 mg total) by mouth daily. 06/01/23  Yes Loyola Mast, MD    Current Facility-Administered Medications  Medication Dose Route Frequency Provider Last Rate Last Admin   0.9 %  sodium chloride  infusion  250 mL Intravenous Continuous Lidia Collum, PA-C   Stopped at 07/18/23 0250   Chlorhexidine Gluconate Cloth 2 % PADS 6 each  6 each Topical Daily Oretha Milch, MD   6 each at 07/18/23 0641   docusate sodium (COLACE) capsule 100 mg  100 mg Oral BID PRN Lidia Collum, PA-C       lactated ringers bolus 500 mL  500 mL Intravenous Once Linwood Dibbles, MD       norepinephrine (LEVOPHED) 4mg  in (0.016 mg/mL) premix infusion  2-10 mcg/min Intravenous Titrated Pia Mau D, PA-C       pantoprazole (PROTONIX) injection 40 mg  40 mg Intravenous Q12H Oretha Milch, MD       polyethylene glycol (MIRALAX / GLYCOLAX) packet 17 g  17 g Oral Daily PRN Lidia Collum, PA-C        Allergies as of 07/17/2023   (No Known Allergies)    Social History   Socioeconomic History   Marital status: Single    Spouse name: Not on file   Number of children: 3   Years of education: Not on file   Highest education level: Not on file  Occupational History   Occupation: Retired- Airline pilot  Tobacco Use   Smoking status: Light Smoker    Current packs/day: 0.25    Types: Cigarettes   Smokeless tobacco: Never   Tobacco comments:    1-3 cigarettes per day 12.4.19  Vaping Use   Vaping status: Never Used  Substance and Sexual Activity   Alcohol use: Yes   Drug use: No   Sexual activity: Not Currently  Other Topics Concern   Not on file  Social History Narrative   Right Handed    Lives in a ranch style home with a basement.    Social Determinants of Health   Financial Resource Strain: Not on file  Food Insecurity: Low Risk  (11/19/2022)   [facility]   Hunger Vital Sign    Worried About Running Out of Food in the Last Year: Never true    Ran Out of Food in the Last Year: Never true  Transportation Needs: No Transportation Needs (11/19/2022)   [facility]   Transportation    In the past 12 months, has lack of reliable transportation kept  you from medical appointments, meetings, work or from getting things needed for daily living? : No  Physical Activity: Not on file  Stress: Not on file  Social Connections: Unknown (01/13/2022)   [facility]   Social Network    Social Network: Not on file  Intimate Partner Violence: Unknown (12/05/2021)   [facility]   HITS    Physically Hurt: Not on file    Insult or Talk Down To: Not on file    Threaten Physical Harm: Not on file    Scream or Curse: Not on file     Code Status   Code Status: Full Code  Review of Systems: All systems reviewed and negative except where noted in HPI.  Physical Exam: Vital signs in last 24 hours: Temp:  [98 F (36.7 C)-98.8 F (37.1 C)] 98.4 F (36.9 C) (11/16 0751) Pulse Rate:  [72-106] 83 (11/16 0700) Resp:  [12-31] 19 (11/16 0700) BP: (74-123)/(29-85) 95/52 (11/16 0700) SpO2:  [93 %-100 %] 97 % (11/16 0700) Weight:  [87.8 kg-89 kg] 87.8 kg (11/16 0865) Last BM Date : 07/17/23  General:  Pleasant male in NAD Psych:  Cooperative. Normal mood and affect Eyes: Pupils equal Ears:  Normal auditory acuity Nose: No deformity, discharge or lesions Neck:  Supple, no masses felt Lungs:  Clear to auscultation.  Heart:  Regular rate, regular rhythm.  Abdomen:  Soft, nondistended, nontender, active bowel sounds, no masses felt Rectal :  Deferred Msk: Symmetrical without gross deformities.  Neurologic:  Alert, oriented, grossly normal neurologically Extremities : No edema Skin:  Intact without significant lesions.    Intake/Output from previous day: 11/15 0701 - 11/16 0700 In: 40 [I.V.:40] Out: -  Intake/Output this shift:  No intake/output data recorded.   Willette Cluster, NP-C   07/18/2023, 8:56 AM

## 2023-07-18 NOTE — Anesthesia Preprocedure Evaluation (Signed)
Anesthesia Evaluation  Patient identified by MRN, date of birth, ID band Patient awake    Reviewed: Allergy & Precautions, NPO status , Patient's Chart, lab work & pertinent test results  Airway Mallampati: III  TM Distance: >3 FB Neck ROM: Full    Dental no notable dental hx.    Pulmonary sleep apnea , Current Smoker and Patient abstained from smoking.   Pulmonary exam normal        Cardiovascular hypertension, Pt. on home beta blockers and Pt. on medications + angina  + CAD, + Past MI, + Cardiac Stents and +CHF  Normal cardiovascular exam     Neuro/Psych  PSYCHIATRIC DISORDERS  Depression     Neuromuscular disease    GI/Hepatic negative GI ROS, Neg liver ROS,,,  Endo/Other  diabetesHypothyroidism    Renal/GU Renal disease     Musculoskeletal negative musculoskeletal ROS (+)    Abdominal   Peds  Hematology  (+) Blood dyscrasia (Plavix), anemia   Anesthesia Other Findings Acute blood loss anemia, melena, dark stools  Reproductive/Obstetrics                              Anesthesia Physical Anesthesia Plan  ASA: 4  Anesthesia Plan: MAC   Post-op Pain Management:    Induction:   PONV Risk Score and Plan: 0 and Propofol infusion and Treatment may vary due to age or medical condition  Airway Management Planned: Nasal Cannula  Additional Equipment:   Intra-op Plan:   Post-operative Plan:   Informed Consent: I have reviewed the patients History and Physical, chart, labs and discussed the procedure including the risks, benefits and alternatives for the proposed anesthesia with the patient or authorized representative who has indicated his/her understanding and acceptance.     Dental advisory given  Plan Discussed with: CRNA  Anesthesia Plan Comments:          Anesthesia Quick Evaluation

## 2023-07-19 DIAGNOSIS — K2970 Gastritis, unspecified, without bleeding: Secondary | ICD-10-CM | POA: Diagnosis not present

## 2023-07-19 DIAGNOSIS — K2990 Gastroduodenitis, unspecified, without bleeding: Secondary | ICD-10-CM

## 2023-07-19 DIAGNOSIS — Z7902 Long term (current) use of antithrombotics/antiplatelets: Secondary | ICD-10-CM | POA: Diagnosis not present

## 2023-07-19 DIAGNOSIS — K2100 Gastro-esophageal reflux disease with esophagitis, without bleeding: Secondary | ICD-10-CM | POA: Diagnosis not present

## 2023-07-19 DIAGNOSIS — K264 Chronic or unspecified duodenal ulcer with hemorrhage: Secondary | ICD-10-CM | POA: Diagnosis not present

## 2023-07-19 LAB — BASIC METABOLIC PANEL
Anion gap: 6 (ref 5–15)
BUN: 110 mg/dL — ABNORMAL HIGH (ref 8–23)
CO2: 18 mmol/L — ABNORMAL LOW (ref 22–32)
Calcium: 8.4 mg/dL — ABNORMAL LOW (ref 8.9–10.3)
Chloride: 110 mmol/L (ref 98–111)
Creatinine, Ser: 2.75 mg/dL — ABNORMAL HIGH (ref 0.61–1.24)
GFR, Estimated: 24 mL/min — ABNORMAL LOW (ref >60)
Glucose, Bld: 134 mg/dL — ABNORMAL HIGH (ref 70–99)
Potassium: 4.7 mmol/L (ref 3.5–5.1)
Sodium: 134 mmol/L — ABNORMAL LOW (ref 135–145)

## 2023-07-19 LAB — GLUCOSE, CAPILLARY
Glucose-Capillary: 113 mg/dL — ABNORMAL HIGH (ref 70–99)
Glucose-Capillary: 86 mg/dL (ref 70–99)
Glucose-Capillary: 100 mg/dL — ABNORMAL HIGH (ref 70–99)
Glucose-Capillary: 108 mg/dL — ABNORMAL HIGH (ref 70–99)
Glucose-Capillary: 162 mg/dL — ABNORMAL HIGH (ref 70–99)
Glucose-Capillary: 144 mg/dL — ABNORMAL HIGH (ref 70–99)

## 2023-07-19 LAB — TYPE AND SCREEN
ABO/RH(D): O POS
Antibody Screen: NEGATIVE
Unit division: 0
Unit division: 0
Unit division: 0

## 2023-07-19 LAB — CBC
HCT: 24.4 % — ABNORMAL LOW (ref 39.0–52.0)
Hemoglobin: 7.8 g/dL — ABNORMAL LOW (ref 13.0–17.0)
MCH: 24.1 pg — ABNORMAL LOW (ref 26.0–34.0)
MCHC: 32 g/dL (ref 30.0–36.0)
MCV: 75.5 fL — ABNORMAL LOW (ref 80.0–100.0)
Platelets: 183 10*3/uL (ref 150–400)
RBC: 3.23 MIL/uL — ABNORMAL LOW (ref 4.22–5.81)
RDW: 16.6 % — ABNORMAL HIGH (ref 11.5–15.5)
WBC: 12.8 10*3/uL — ABNORMAL HIGH (ref 4.0–10.5)
nRBC: 0 % (ref 0.0–0.2)

## 2023-07-19 LAB — BPAM RBC
Blood Product Expiration Date: 202412132359
Blood Product Expiration Date: 202412132359
Blood Product Expiration Date: 202412132359
ISSUE DATE / TIME: 202411152249
ISSUE DATE / TIME: 202411152349
ISSUE DATE / TIME: 202411160044
Unit Type and Rh: 5100
Unit Type and Rh: 5100
Unit Type and Rh: 5100

## 2023-07-19 LAB — HEMOGLOBIN AND HEMATOCRIT, BLOOD
HCT: 22.8 % — ABNORMAL LOW (ref 39.0–52.0)
HCT: 23.8 % — ABNORMAL LOW (ref 39.0–52.0)
HCT: 25.5 % — ABNORMAL LOW (ref 39.0–52.0)
HCT: 23.6 % — ABNORMAL LOW (ref 39.0–52.0)
HCT: 22.2 % — ABNORMAL LOW (ref 39.0–52.0)
Hemoglobin: 7.5 g/dL — ABNORMAL LOW (ref 13.0–17.0)
Hemoglobin: 7.7 g/dL — ABNORMAL LOW (ref 13.0–17.0)
Hemoglobin: 8.2 g/dL — ABNORMAL LOW (ref 13.0–17.0)
Hemoglobin: 7.6 g/dL — ABNORMAL LOW (ref 13.0–17.0)
Hemoglobin: 7.2 g/dL — ABNORMAL LOW (ref 13.0–17.0)

## 2023-07-19 MED ORDER — METOPROLOL SUCCINATE ER 25 MG PO TB24
25.0000 mg | ORAL_TABLET | Freq: Every day | ORAL | Status: DC
  Administered 2023-07-19 – 2023-07-20 (×2): 25 mg via ORAL
  Filled 2023-07-19 (×2): qty 1

## 2023-07-19 MED ORDER — LEVOTHYROXINE SODIUM 75 MCG PO TABS
150.0000 ug | ORAL_TABLET | Freq: Every day | ORAL | Status: DC
  Administered 2023-07-20: 150 ug via ORAL
  Filled 2023-07-19: qty 2

## 2023-07-19 MED ORDER — IRBESARTAN 75 MG PO TABS
75.0000 mg | ORAL_TABLET | Freq: Every day | ORAL | Status: DC
  Administered 2023-07-19 – 2023-07-20 (×2): 75 mg via ORAL
  Filled 2023-07-19 (×2): qty 1

## 2023-07-19 NOTE — Progress Notes (Signed)
[facility] ROUNDING NOTE   Subjective: Patient reports feeling well today.  No bowel movements since the procedure.  His hemoglobin has been stable.  His BUN remains significantly elevated.  He is hungry and would like to advance diet.   Objective: Vital signs in last 24 hours: Temp:  [97.8 F (36.6 C)-98.1 F (36.7 C)] 98 F (36.7 C) (11/17 0747) Pulse Rate:  [76-97] 86 (11/17 0747) Resp:  [16-23] 17 (11/17 0747) BP: (94-118)/(49-68) 107/60 (11/17 0747) SpO2:  [94 %-100 %] 96 % (11/17 0747) Weight:  [87.1 kg] 87.1 kg (11/17 0522) Last BM Date : 07/18/23 General: NAD, pleasant male Lungs:  CTA b/l, no w/r/r Heart:  RRR, no m/r/g Abdomen:  Soft, NT, ND, +BS Ext:  No c/c/e    Intake/Output from previous day: 11/16 0701 - 11/17 0700 In: 580 [P.O.:480; I.V.:100] Out: 650 [Urine:650] Intake/Output this shift: Total I/O In: 120 [P.O.:120] Out: -    Lab Results: Recent Labs    07/18/23 0436 07/18/23 0813 07/18/23 1730 07/19/23 0056 07/19/23 0631 07/19/23 0901  WBC 11.8*  --  9.3  --  12.8*  --   HGB 7.9*   < > 7.3* 7.5* 7.8* 7.7*  PLT 159  --  155  --  183  --   MCV 76.3*  --  74.8*  --  75.5*  --    < > = values in this interval not displayed.   BMET Recent Labs    07/17/23 2104 07/18/23 0436 07/19/23 0056  NA 134* 135 134*  K 4.9 4.9 4.7  CL 107 110 110  CO2 17* 18* 18*  GLUCOSE 166* 133* 134*  BUN 107* 117* 110*  CREATININE 2.59* 2.88* 2.75*  CALCIUM 8.8* 8.1* 8.4*   LFT No results for input(s): "PROT", "ALBUMIN", "AST", "ALT", "ALKPHOS", "BILITOT", "BILIDIR", "IBILI" in the last 72 hours. PT/INR No results for input(s): "INR" in the last 72 hours.    Imaging/Other results: DG Chest 2 View  Result Date: 07/17/2023 CLINICAL DATA:  Chest pain EXAM: CHEST - 2 VIEW COMPARISON:  12/04/2020 FINDINGS: The heart size and mediastinal contours are within normal limits. Aortic atherosclerosis. Both lungs are clear. The visualized skeletal  structures are unremarkable. Mild bronchitic changes. IMPRESSION: No active cardiopulmonary disease. Mild bronchitic changes. Electronically Signed   By: Jasmine Pang M.D.   On: 07/17/2023 22:15      Assessment and Plan:  72 year old male with history of coronary artery disease status post stenting on aspirin and Plavix who presented with brisk upper GI bleed with hemorrhagic shock which quickly responded to volume resuscitation and PPI therapy.  He underwent an EGD on November 16 which was notable for multiple duodenal ulcers including 1 with a small pigmented spot.  Hemostatic therapy deferred.  Duodenal ulcer with hemorrhage, unclear etiology (aspirin?,  H. pylori?) -Okay to advance to regular diet today - Continue twice daily IV PPI today - Continue 4 times daily Carafate - Trend CBC, BMP daily - Continue to hold Plavix and aspirin, query whether patient may be candidate for single antiplatelet agent given significant bleed - Would recommend observing patient 1 more day given the significance of his bleed and small bleeding stigmata on ulcer - Discharge home tomorrow if no concern for rebleed.  Restart Plavix Tuesday, would recommend Plavix monotherapy as opposed to DAPT if feasible - Patient should be discharged on twice daily PPI for 8 weeks, followed by once daily PPI indefinitely - Will arrange routine outpatient follow-up to discuss repeat  endoscopy to reassess resolution of peptic ulcer disease  GERD with esophagitis - Asymptomatic - Twice daily PPI as above, followed by indefinite once daily therapy  Coronary artery disease status post stent - Query whether Plavix monotherapy may be adequate given bleeding complication and peptic ulcer disease.  Aspirin may have been responsible for ulcers.  GI will sign off at this time.  Please reconsult if the patient shows any evidence of active bleeding, or if there are other questions.     Jenel Lucks, MD  07/19/2023, 11:37  AM [facility]

## 2023-07-19 NOTE — Discharge Summary (Signed)
Physician Discharge Summary         Patient ID: Nicolas Ray MRN: [id number] DOB/AGE: Nov 13, 1950 72 y.o.  Admit date: 07/17/2023 Discharge date: 07/20/2023  Discharge Diagnoses:    Active Hospital Problems   Diagnosis Date Noted   GI bleed 07/17/2023   Duodenal ulcer hemorrhage 07/18/2023   Platelet inhibition due to Plavix 07/18/2023   Coronary artery disease involving coronary bypass graft of native heart without angina pectoris    Hypothyroidism 02/08/2013    Resolved Hospital Problems   Diagnosis Date Noted Date Resolved   Hemorrhagic shock [facility]) 07/18/2023 07/19/2023      Discharge summary    72 year old man BIBEMS for substernal chest pressure for 1 day, worse with walking.  He reported dark-colored stool.  Compliant with aspirin and Plavix, denies OTC NSAIDs.  He had an episode of dark black loose stool and became hypotensive.  Hemoglobin of 6.6, was 11.7 on 11/3.  He was transfused 1 unit PRBC with slight improvement in blood pressure, PCCM consulted Of note, he had an ED visit 11/3 for hematuria and mild bilateral hydronephrosis.  Seen by urology in clinic and urinary retention improved.  Hospital course  11/15 admitted. Started on PPI infusion. Received 2 units PRBC rapidly. Asa and plavix held. Had some Chest pain so GI consult held off initially 11/16 GI consult. Received 3rd unit blood over night. CP resolved. EGD completed: grade C esophagitis, few dispersed diminutive erosions with no bleeding and no stigmata of recent bleeding were found in the gastric antrum, A 4 cm hiatal hernia was present. Two non-bleeding duodenal ulcers were found in the duodenal bulb. The       largest lesion was superficial and about 8 mm in largest dimension. The   smaller ulcer was cratered and had a small focus of pigmented material. Diffuse moderately erythematous and friable mucosa without active  bleeding and with no stigmata of bleeding was found in the duodenal bulb  and in the  second portion of the duodenum. Was sent to ICU s/p extubation in recovery room. Recommendations for PPI, carafate, DAPT (holding asa) and diet were made. He was observed to ensure stable hgb, and how diet is tolerated   Discharge Plan by Active Problems   Lower GI bleed due to bleeding duodenal ulcer with acute blood loss anemia  Etiology not clear. NSAIDs? H pylori.  Plan Reg diet Ppi bid x 8 weeks then once daily  Carafate qid Hold plavix until 11/19 then ok to resume, he is several years s/p PCI (last cath in 2024 diagnostic only). So should be fine to continue w/plavix alone and avoid NSAIDs recommended by GI (worried ASA could have been contributing   CAD w/ h/o Ischemic cardiomyopathy discussed with Dr. Jacques Navy, Cardiology, also messaged his primary cardiologist  Plan Plavix alone as per GI reccomendations Resume Betablocker, ARB and statin    Hypothyroidism plan Resume synthroid     Consults  [facility]    Discharge Exam: BP 129/64 (BP Location: Left Arm)   Pulse 77   Temp 97.6 F (36.4 C) (Oral)   Resp 18   Ht 5\' 8"  (1.727 m)   Wt 85.7 kg   SpO2 99%   BMI 28.73 kg/m   Gen: Pleasant, well-nourished, in no distress,  normal affect  ENT: No lesions,  mouth clear,  oropharynx clear, no postnasal drip  Neck: No JVD, no stridor  Lungs: No use of accessory muscles, no crackles or wheezing on normal respiration, no wheeze  on forced expiration  Cardiovascular: RRR, heart sounds normal, no murmur or gallops, no peripheral edema  Abdomen: soft and NT, no HSM,  BS normal  Musculoskeletal: No deformities, no cyanosis or clubbing  Neuro: alert, awake, non focal  Skin: Warm, no lesions or rashes   Labs at discharge   Lab Results  Component Value Date   CREATININE 2.75 (H) 07/19/2023   BUN 110 (H) 07/19/2023   NA 134 (L) 07/19/2023   K 4.7 07/19/2023   CL 110 07/19/2023   CO2 18 (L) 07/19/2023   Lab Results  Component Value Date   WBC 12.8 (H)  07/19/2023   HGB 7.2 (L) 07/20/2023   HCT 22.6 (L) 07/20/2023   MCV 75.5 (L) 07/19/2023   PLT 183 07/19/2023   Lab Results  Component Value Date   ALT 15 05/24/2023   AST 16 05/24/2023   ALKPHOS 56 05/24/2023   BILITOT 0.6 05/24/2023   No results found for: "INR", "PROTIME"  Current radiological studies    No results found.  Disposition:    Discharge disposition: 01-Home or Self Care       Discharge Instructions     Discharge patient   Complete by: As directed    Discharge disposition: 01-Home or Self Care   Discharge patient date: 07/20/2023       Allergies as of 07/20/2023   No Known Allergies      Medication List     STOP taking these medications    aspirin EC 81 MG tablet       TAKE these medications    alfuzosin 10 MG 24 hr tablet Commonly known as: UROXATRAL Take 10 mg by mouth daily.   clopidogrel 75 MG tablet Commonly known as: PLAVIX TAKE 1 TABLET(75 MG) BY MOUTH DAILY OK to restart this medication on 07/22/2023 What changed: See the new instructions.   gabapentin 300 MG capsule Commonly known as: NEURONTIN Take 1 capsule (300 mg total) by mouth at bedtime.   levothyroxine 150 MCG tablet Commonly known as: SYNTHROID Take 1 tablet (150 mcg total) by mouth daily before breakfast.   metoprolol succinate 25 MG 24 hr tablet Commonly known as: TOPROL-XL Take 1 tablet (25 mg total) by mouth in the morning.   nitroGLYCERIN 0.4 MG SL tablet Commonly known as: NITROSTAT Dissolve 1 tablet under the tongue every 5 minutes as needed for chest pain. Max of 3 doses, then 911.   pantoprazole 40 MG tablet Commonly known as: PROTONIX Take 1 tablet (40 mg total) by mouth 2 (two) times daily. Take 1 twice daily with meals for 8 weeks.  Then change to once daily with meal and continue   rosuvastatin 10 MG tablet Commonly known as: CRESTOR TAKE 1 TABLET(10 MG) BY MOUTH DAILY   sucralfate 1 g tablet Commonly known as: Carafate Take 1 tablet  (1 g total) by mouth 4 (four) times daily.   valsartan 80 MG tablet Commonly known as: DIOVAN Take 1 tablet (80 mg total) by mouth daily.         Follow-up appointment   Make appointment to see PCP in 2-4 weeks.   Discharge Condition:    good   Signed: Leslye Peer 07/20/2023, 9:48 AM

## 2023-07-19 NOTE — Progress Notes (Addendum)
NAME:  Nicolas Ray, MRN:  [id number], DOB:  [birth year], LOS: 2 ADMISSION DATE:  07/17/2023, CONSULTATION DATE:  07/19/2023  REFERRING MD:  Nira Conn, EDP, CHIEF COMPLAINT:  UGI bleed   History of Present Illness:   72 year old man BIBEMS for substernal chest pressure for 1 day, worse with walking.  He reported dark-colored stool.  Compliant with aspirin and Plavix, denies OTC NSAIDs.  He had an episode of dark black loose stool and became hypotensive.  Hemoglobin of 6.6, was 11.7 on 11/3.  He was transfused 1 unit PRBC with slight improvement in blood pressure, PCCM consulted Of note, he had an ED visit 11/3 for hematuria and mild bilateral hydronephrosis.  Seen by urology in clinic and urinary retention improved  Pertinent  Medical History  Coronary artery disease  S/p Lat STEMI 04/2015 >> s/p DES to D1 Patient refused CABG >> staged PCI: DES to mid LAD and DES to prox RCA Chronic systolic CHF Ischemic CM ACEi DCd in past due to worsening Creatinine  Echocardiogram 12/2020 EF 35-40% Acute pulmonary edema March 2024, LHC 12/2022 patent stents Diabetes mellitus  Chronic kidney disease 3b Hypertension  Tobacco use R hemidiaphragm paralysis Mild OSA  Significant Hospital Events: Including procedures, antibiotic start and stop dates in addition to other pertinent events   11/15 Admit with GIB  Interim History / Subjective:   Transferred out of ICU.  Stable hemodynamics and hemoglobin.  No complaints today  Objective   Blood pressure 107/60, pulse 86, temperature 98 F (36.7 C), temperature source Oral, resp. rate 17, height 5\' 8"  (1.727 m), weight 87.1 kg, SpO2 96%.        Intake/Output Summary (Last 24 hours) at 07/19/2023 1242 Last data filed at 07/19/2023 0750 Gross per 24 hour  Intake 600 ml  Output --  Net 600 ml   Filed Weights   07/18/23 7829 07/18/23 1124 07/19/23 0522  Weight: 87.8 kg 87.8 kg 87.1 kg    Examination: Blood pressure 107/60, pulse 86, temperature 98  F (36.7 C), temperature source Oral, resp. rate 17, height 5\' 8"  (1.727 m), weight 87.1 kg, SpO2 96%. Gen:      No acute distress HEENT:  EOMI, sclera anicteric Neck:     No masses; no thyromegaly Lungs:    Clear to auscultation bilaterally; normal respiratory effort CV:         Regular rate and rhythm; no murmurs Abd:      + bowel sounds; soft, non-tender; no palpable masses, no distension Ext:    No edema; adequate peripheral perfusion Skin:      Warm and dry; no rash Neuro: alert and oriented x 3 Psych: normal mood and affect   Lab/imaging reviewed Sodium 134, creatinine 2.75 Hemoglobin 7.7   Resolved Hospital Problem list     Assessment & Plan:  His substernal chest pressure likely indicates angina equivalent, this is in the setting of upper GI bleed with melena and severe anemia  Hemorrhagic shock secondary to duodenal ulcer bleed Acute blood loss anemia S/p 3 units PRBC Follow CBC Advance diet Continue Protonix.  CAD. Ischemic cardiomyopathy Came in with chest pressure but EKG and troponins unremarkable. Holding aspirin, Plavix. I discussed with Dr. Jacques Navy, Cardiology who is ok with discharge on Plavix alone as per GI reccomendations Resume metoprolol and ARB  Hypothyroidism Resume synthroid  Best Practice (right click and "Reselect all SmartList Selections" daily)   Diet/type: Regular consistency (see orders) DVT prophylaxis: SCD GI prophylaxis: PPI Lines: N/A Foley:  N/A Code Status:  full code Last date of multidisciplinary goals of care discussion [NA]  Critical care time: NA   Chilton Greathouse MD [facility] See Amion for pager  If no response to pager , please call [phone] until 7pm After 7:00 pm call Elink  [phone] 07/19/2023, 12:42 PM

## 2023-07-20 LAB — GLUCOSE, CAPILLARY
Glucose-Capillary: 102 mg/dL — ABNORMAL HIGH (ref 70–99)
Glucose-Capillary: 171 mg/dL — ABNORMAL HIGH (ref 70–99)
Glucose-Capillary: 96 mg/dL (ref 70–99)

## 2023-07-20 LAB — HEMOGLOBIN AND HEMATOCRIT, BLOOD
HCT: 26 % — ABNORMAL LOW (ref 39.0–52.0)
HCT: 22.6 % — ABNORMAL LOW (ref 39.0–52.0)
Hemoglobin: 8.3 g/dL — ABNORMAL LOW (ref 13.0–17.0)
Hemoglobin: 7.2 g/dL — ABNORMAL LOW (ref 13.0–17.0)

## 2023-07-20 MED ORDER — PANTOPRAZOLE SODIUM 40 MG PO TBEC: 40.0000 mg | DELAYED_RELEASE_TABLET | Freq: Two times a day (BID) | ORAL | 6 refills | Status: DC

## 2023-07-20 MED ORDER — CLOPIDOGREL BISULFATE 75 MG PO TABS: ORAL_TABLET | ORAL | 3 refills | Status: DC

## 2023-07-20 MED ORDER — SUCRALFATE 1 G PO TABS: 1.0000 g | ORAL_TABLET | Freq: Four times a day (QID) | ORAL | 1 refills | Status: DC

## 2023-07-20 NOTE — Progress Notes (Signed)
Larena Glassman to be D/C'd  per MD order.  Discussed with the patient and all questions fully answered.  VSS, Skin clean, dry and intact without evidence of skin break down, no evidence of skin tears noted.  IV catheter discontinued intact. Site without signs and symptoms of complications. Dressing and pressure applied.  An After Visit Summary was printed and given to the patient. Patient received prescription.  D/c education completed with patient/family including follow up instructions, medication list, d/c activities limitations if indicated, with other d/c instructions as indicated by MD - patient able to verbalize understanding, all questions fully answered.   Patient instructed to return to ED, call 911, or call MD for any changes in condition.   Patient to be escorted via WC, and D/C home via private auto.

## 2023-07-20 NOTE — Plan of Care (Signed)
  Problem: Education: Goal: Knowledge of General Education information will improve Description: Including pain rating scale, medication(s)/side effects and non-pharmacologic comfort measures 07/20/2023 0503 by Faustino Congress, RN Outcome: Progressing 07/20/2023 0503 by Faustino Congress, RN Outcome: Progressing   Problem: Health Behavior/Discharge Planning: Goal: Ability to manage health-related needs will improve 07/20/2023 0503 by Faustino Congress, RN Outcome: Progressing 07/20/2023 0503 by Faustino Congress, RN Outcome: Progressing   Problem: Clinical Measurements: Goal: Ability to maintain clinical measurements within normal limits will improve 07/20/2023 0503 by Faustino Congress, RN Outcome: Progressing 07/20/2023 0503 by Faustino Congress, RN Outcome: Progressing Goal: Will remain free from infection 07/20/2023 0503 by Faustino Congress, RN Outcome: Progressing 07/20/2023 0503 by Faustino Congress, RN Outcome: Progressing Goal: Diagnostic test results will improve 07/20/2023 0503 by Faustino Congress, RN Outcome: Progressing 07/20/2023 0503 by Faustino Congress, RN Outcome: Progressing Goal: Respiratory complications will improve 07/20/2023 0503 by Faustino Congress, RN Outcome: Progressing 07/20/2023 0503 by Faustino Congress, RN Outcome: Progressing Goal: Cardiovascular complication will be avoided 07/20/2023 0503 by Faustino Congress, RN Outcome: Progressing 07/20/2023 0503 by Faustino Congress, RN Outcome: Progressing   Problem: Activity: Goal: Risk for activity intolerance will decrease 07/20/2023 0503 by Faustino Congress, RN Outcome: Progressing 07/20/2023 0503 by Faustino Congress, RN Outcome: Progressing   Problem: Nutrition: Goal: Adequate nutrition will be maintained 07/20/2023 0503 by Faustino Congress, RN Outcome: Progressing 07/20/2023 0503 by Faustino Congress, RN Outcome: Progressing   Problem:  Coping: Goal: Level of anxiety will decrease Outcome: Progressing   Problem: Elimination: Goal: Will not experience complications related to bowel motility Outcome: Progressing Goal: Will not experience complications related to urinary retention Outcome: Progressing   Problem: Pain Management: Goal: General experience of comfort will improve Outcome: Progressing   Problem: Safety: Goal: Ability to remain free from injury will improve Outcome: Progressing   Problem: Skin Integrity: Goal: Risk for impaired skin integrity will decrease Outcome: Progressing

## 2023-07-20 NOTE — Care Management Important Message (Signed)
Important Message  Patient Details  Name: Nicolas Ray MRN: [id number] Date of Birth: [birth year]   Important Message Given:  Yes - Medicare IM     Sherilyn Banker 07/20/2023, 2:50 PM

## 2023-07-20 NOTE — Plan of Care (Signed)

## 2023-07-21 ENCOUNTER — Encounter (HOSPITAL_COMMUNITY): Payer: Self-pay | Admitting: Gastroenterology

## 2023-07-23 LAB — SURGICAL PATHOLOGY

## 2023-07-26 ENCOUNTER — Encounter: Payer: Self-pay | Admitting: Family Medicine

## 2023-07-26 DIAGNOSIS — K2950 Unspecified chronic gastritis without bleeding: Secondary | ICD-10-CM | POA: Insufficient documentation

## 2023-07-27 ENCOUNTER — Telehealth: Payer: Self-pay | Admitting: Gastroenterology

## 2023-07-27 ENCOUNTER — Telehealth: Payer: Self-pay | Admitting: Emergency Medicine

## 2023-07-27 ENCOUNTER — Other Ambulatory Visit: Payer: Self-pay

## 2023-07-27 MED ORDER — BISMUTH 262 MG PO CHEW: 524.0000 mg | CHEWABLE_TABLET | Freq: Four times a day (QID) | ORAL | 0 refills | Status: DC

## 2023-07-27 MED ORDER — PANTOPRAZOLE SODIUM 40 MG PO TBEC: 40.0000 mg | DELAYED_RELEASE_TABLET | Freq: Two times a day (BID) | ORAL | 0 refills | Status: DC

## 2023-07-27 MED ORDER — METRONIDAZOLE 250 MG PO TABS: 250.0000 mg | ORAL_TABLET | Freq: Four times a day (QID) | ORAL | 0 refills | Status: DC

## 2023-07-27 MED ORDER — DOXYCYCLINE HYCLATE 100 MG PO CAPS: 100.0000 mg | ORAL_CAPSULE | Freq: Two times a day (BID) | ORAL | 0 refills | Status: DC

## 2023-07-27 NOTE — Telephone Encounter (Signed)
PT with sister is calling to discuss why he has to have the 4 medications called in today and what are they for. There is also one that contains aluminum and he's allergic to it. Please advise.

## 2023-07-27 NOTE — Telephone Encounter (Signed)
Please advise on medication questions. Or should they contact PCP/GI? Thanks!

## 2023-07-27 NOTE — Telephone Encounter (Signed)
Spoke with pts sister and she is aware of path results and the medications that were sent in for him to take. She knows we will let him know when it is time to come and do the stool specimen to make sure the bacteria has cleared.

## 2023-07-27 NOTE — Telephone Encounter (Signed)
He can stop the carafate if he is allergic to its components  The protonix is to suppress acid because he had an ulcer seen on his endoscopy. He needs to take it as prescribed so the ulcer can heal.

## 2023-07-27 NOTE — Telephone Encounter (Signed)
Spoke with the pt's sister and notified of response per Dr Delton Coombes  She verbalized understanding  Nothing further needed

## 2023-07-27 NOTE — Telephone Encounter (Signed)
Dr. Delton Coombes saw this PT in the hospital. He prescribed the following:  sucralfate (CARAFATE) 1 g tablet [[id number]]  for his ulcer. This med contains aluminum, she says and he is allergic. Can Dr. Diamantina Monks in an alt med.  Also, they are not taking pantoprazole (PROTONIX) 40 MG tablet because sister states he does not have relfux.   She also took him off two blood thinners because they can call death (Rosuvastin) and he does not need that.   She adds that PT is feeling weak as well.   In short, sister needs to speak to Dr/Triage about meds. Her # is [phone]

## 2023-07-27 NOTE — Progress Notes (Signed)
Mr. Nicolas Ray,  The biopsies from the recent upper GI Endoscopy were notable for H. Pylori gastritis, and will plan on treating with quad therapy as below. Please confirm no medication allergies to the prescribed regimen.   1) Pantoprazole 40 mg 2 times a day x 14 d (already taking) 2) Pepto Bismol 2 tabs (262 mg each) 4 times a day x 14 d 3) Metronidazole 250 mg 4 times a day x 14 d 4) doxycycline 100 mg 2 times a day x 14 d   4 weeks after treatment completed, check H. Pylori stool antigen to confirm eradication (must be off acid suppression therapy for 2 weeks prior to specimen submission)  It is recommended you repeat an EGD sometime after 8 weeks to assess healing of the ulcers and esophagitis.  This can be scheduled at your follow up visit.  Bonita Quin,  Can you please place the orders for the above medications and stool antigen?

## 2023-07-29 DIAGNOSIS — H353211 Exudative age-related macular degeneration, right eye, with active choroidal neovascularization: Secondary | ICD-10-CM | POA: Diagnosis not present

## 2023-08-03 ENCOUNTER — Encounter: Payer: Self-pay | Admitting: Family Medicine

## 2023-08-03 ENCOUNTER — Ambulatory Visit: Payer: 59 | Admitting: Family Medicine

## 2023-08-03 VITALS — BP 120/68 | HR 80 | Temp 98.1°F | Ht 68.0 in | Wt 195.6 lb

## 2023-08-03 DIAGNOSIS — D62 Acute posthemorrhagic anemia: Secondary | ICD-10-CM | POA: Diagnosis not present

## 2023-08-03 DIAGNOSIS — K269 Duodenal ulcer, unspecified as acute or chronic, without hemorrhage or perforation: Secondary | ICD-10-CM

## 2023-08-03 DIAGNOSIS — I25810 Atherosclerosis of coronary artery bypass graft(s) without angina pectoris: Secondary | ICD-10-CM

## 2023-08-03 DIAGNOSIS — E1165 Type 2 diabetes mellitus with hyperglycemia: Secondary | ICD-10-CM

## 2023-08-03 DIAGNOSIS — E782 Mixed hyperlipidemia: Secondary | ICD-10-CM

## 2023-08-03 DIAGNOSIS — I5022 Chronic systolic (congestive) heart failure: Secondary | ICD-10-CM | POA: Diagnosis not present

## 2023-08-03 DIAGNOSIS — E1140 Type 2 diabetes mellitus with diabetic neuropathy, unspecified: Secondary | ICD-10-CM

## 2023-08-03 LAB — BASIC METABOLIC PANEL
BUN: 48 mg/dL — ABNORMAL HIGH (ref 6–23)
CO2: 21 meq/L (ref 19–32)
Calcium: 8.5 mg/dL (ref 8.4–10.5)
Chloride: 108 meq/L (ref 96–112)
Creatinine, Ser: 2.42 mg/dL — ABNORMAL HIGH (ref 0.40–1.50)
GFR: 26.07 mL/min — ABNORMAL LOW (ref >60.00)
Glucose, Bld: 118 mg/dL — ABNORMAL HIGH (ref 70–99)
Potassium: 4.6 meq/L (ref 3.5–5.1)
Sodium: 137 meq/L (ref 135–145)

## 2023-08-03 LAB — CBC
HCT: 31.3 % — ABNORMAL LOW (ref 39.0–52.0)
Hemoglobin: 9.8 g/dL — ABNORMAL LOW (ref 13.0–17.0)
MCHC: 31.4 g/dL (ref 30.0–36.0)
MCV: 76.5 fL — ABNORMAL LOW (ref 78.0–100.0)
Platelets: 238 10*3/uL (ref 150.0–400.0)
RBC: 4.1 Mil/uL — ABNORMAL LOW (ref 4.22–5.81)
RDW: 17.7 % — ABNORMAL HIGH (ref 11.5–15.5)
WBC: 7.4 10*3/uL (ref 4.0–10.5)

## 2023-08-03 LAB — LIPID PANEL
Cholesterol: 153 mg/dL (ref 0–200)
HDL: 32.8 mg/dL — ABNORMAL LOW (ref >39.00)
LDL Cholesterol: 92 mg/dL (ref 0–99)
NonHDL: 120.55
Total CHOL/HDL Ratio: 5
Triglycerides: 144 mg/dL (ref 0.0–149.0)
VLDL: 28.8 mg/dL (ref 0.0–40.0)

## 2023-08-03 LAB — VITAMIN D 25 HYDROXY (VIT D DEFICIENCY, FRACTURES): VITD: 29.24 ng/mL — ABNORMAL LOW (ref 30.00–100.00)

## 2023-08-03 LAB — HEMOGLOBIN A1C: Hgb A1c MFr Bld: 5.8 % (ref 4.6–6.5)

## 2023-08-03 NOTE — Assessment & Plan Note (Signed)
I will reassess his CBC today.

## 2023-08-03 NOTE — Assessment & Plan Note (Addendum)
Compensated. I attempted to explain to Nicolas Ray and his sister that the valsartan and metoprolol are being prescribed to him for management of his chronic heart failure and not necessarily for control of hypertension. Nicolas Ray expresses doubt as to the necessity of these medicines as he feels his heart is doing well. Nicolas Ray has often expressed doubts about the necessity of treatment or even that he has certain health issues. This remains a challenge in providing him with standard care. I recommend that he resume his metoprolol. He should follow up with his cardiologist if he has remaining concerns.

## 2023-08-03 NOTE — Progress Notes (Signed)
[facility]rec[address] [facility] [address] [place] [address] Dept: [phone] Dept Fax: [phone]  Hospital Follow-up Visit  Subjective:    Patient ID: BERNON SURTI, male    DOB: Jan 10, 1951, 72 y.o..   MRN: [id number]  Chief Complaint  Patient presents with   Hospitalization Follow-up    Hospital f/u fro GI bleed d/c 07/17/23   History of Present Illness:  Patient is in today for follow-up from his recent hospitalization. He was admitted at [facility] from 11/15-11/18/2024 due to acute GI bleeding secondary to duodenal ulcers. He was also noted to have esophagitis and gastritis. He tested positive for H. pylori. Mr. Pizzolato did receive a blood transfusion. He is currently on a regimen of doxycycline and metronidazole. Although he was also recommended to take bismuth, he has not picked this up yet.  Mr. Quint is here with his sister, Olm, today. She notes she had been looking up information about his medications on line. Due to concerns about his therapy, she had advised him to stop taking his metoprolol. She states she saw he was on both metoprolol and valsartan which are both used for hypertensin. She notes his blood pressure has been normal, so she felt he did not need duplicate therapy. As well, he stopped taking his pantoprazole as she had concerns for its interaction with his clopidogrel. Lastly, she had concerns about whether he needed his rosuvastatin. Mr. Cape expressed confusion about whether he needed to continue to take tamsulosin.  Mr. Rufenacht also states that after leaving the hospital, he developed a weakness/numbness in his right wrist. He relates this to having been prescribed sucralfate. His sister found that this preparation has aluminum in it. She notes her brother has an allergy to aluminum.   Past Medical History: Patient Active Problem List   Diagnosis Date Noted   Acute blood loss anemia 08/03/2023   Chronic gastritis 07/26/2023    Duodenal ulcer 07/18/2023   Platelet inhibition due to Plavix 07/18/2023   GI bleed 07/17/2023   Right nephrolithiasis 07/14/2023   Viral URI with cough 07/14/2023   Nut allergy 06/01/2023   Gross hematuria 06/01/2023   Primary insomnia 06/01/2023   HFrEF (heart failure with reduced ejection fraction) (HCC) 12/09/2022   Ischemic cardiomyopathy 11/19/2022   Peripheral polyneuropathy 12/02/2021   CKD (chronic kidney disease) stage 4, GFR 15-29 ml/min (HCC) 08/08/2021   Hyperlipidemia, unspecified 08/08/2021   Hypertensive heart disease with heart failure (HCC) 08/08/2021   Lower urinary tract symptoms 08/08/2021   Smoker 08/08/2021   Stented coronary artery 08/08/2021   History of ST elevation myocardial infarction (STEMI), of lateral wall 08/08/2021   OSA (obstructive sleep apnea) 09/21/2018   BPH with urinary obstruction 04/14/2016   Bilateral hydronephrosis 01/08/2016   Urinary retention 01/08/2016   Coronary artery disease involving coronary bypass graft of native heart without angina pectoris    Poorly controlled type 2 diabetes mellitus with neuropathy (HCC) 04/19/2015   Chronic systolic heart failure (HCC) 04/19/2015   Other male erectile dysfunction 04/18/2014   Sciatica 03/15/2013   Hypothyroidism 02/08/2013   Essential hypertension 02/08/2013   Depression 01/25/2013   Low back pain 01/25/2013   Past Surgical History:  Procedure Laterality Date   BIOPSY  07/18/2023   Procedure: BIOPSY;  Surgeon: Jenel Lucks, MD;  Location: [facility] ENDOSCOPY;  Service: Gastroenterology;;   CARDIAC CATHETERIZATION N/A 04/18/2015   Procedure: Left Heart Cath and Coronary Angiography;  Surgeon: Tonny Bollman, MD;  Location: [facility] INVASIVE CV  LAB;  Service: Cardiovascular;  Laterality: N/A;   CARDIAC CATHETERIZATION N/A 04/18/2015   Procedure: Coronary Stent Intervention;  Surgeon: Tonny Bollman, MD;  Location: [facility] INVASIVE CV LAB;  Service: Cardiovascular;  Laterality: N/A;   CARDIAC  CATHETERIZATION N/A 04/20/2015   Procedure: Coronary Stent Intervention;  Surgeon: Kathleene Hazel, MD;  Location: [facility] INVASIVE CV LAB;  Service: Cardiovascular;  Laterality: N/A;   CORONARY STENT PLACEMENT     ESOPHAGOGASTRODUODENOSCOPY N/A 07/18/2023   Procedure: ESOPHAGOGASTRODUODENOSCOPY (EGD);  Surgeon: Jenel Lucks, MD;  Location: [facility] ENDOSCOPY;  Service: Gastroenterology;  Laterality: N/A;   RIGHT/LEFT HEART CATH AND CORONARY ANGIOGRAPHY N/A 12/09/2022   Procedure: RIGHT/LEFT HEART CATH AND CORONARY ANGIOGRAPHY;  Surgeon: Tonny Bollman, MD;  Location: [facility] INVASIVE CV LAB;  Service: Cardiovascular;  Laterality: N/A;   Family History  Problem Relation Age of Onset   Heart disease Mother    Cancer Father        Neck   Thyroid disease Sister    Stroke Brother    Colon cancer Neg Hx    Diabetes Neg Hx    Outpatient Medications Prior to Visit  Medication Sig Dispense Refill   alfuzosin (UROXATRAL) 10 MG 24 hr tablet Take 10 mg by mouth daily.     Bismuth 262 MG CHEW Chew 524 mg by mouth in the morning, at noon, in the evening, and at bedtime. 112 tablet 0   clopidogrel (PLAVIX) 75 MG tablet TAKE 1 TABLET(75 MG) BY MOUTH DAILY OK to restart this medication on 07/22/2023 90 tablet 3   doxycycline (VIBRAMYCIN) 100 MG capsule Take 1 capsule (100 mg total) by mouth 2 (two) times daily. 28 capsule 0   gabapentin (NEURONTIN) 300 MG capsule Take 1 capsule (300 mg total) by mouth at bedtime. 90 capsule 3   levothyroxine (SYNTHROID) 150 MCG tablet Take 1 tablet (150 mcg total) by mouth daily before breakfast. 90 tablet 3   metroNIDAZOLE (FLAGYL) 250 MG tablet Take 1 tablet (250 mg total) by mouth 4 (four) times daily. 56 tablet 0   nitroGLYCERIN (NITROSTAT) 0.4 MG SL tablet Dissolve 1 tablet under the tongue every 5 minutes as needed for chest pain. Max of 3 doses, then 911. 25 tablet 5   valsartan (DIOVAN) 80 MG tablet Take 1 tablet (80 mg total) by mouth daily. 90 tablet 3    metoprolol succinate (TOPROL-XL) 25 MG 24 hr tablet Take 1 tablet (25 mg total) by mouth in the morning. (Patient not taking: Reported on 08/03/2023) 90 tablet 3   pantoprazole (PROTONIX) 40 MG tablet Take 1 tablet (40 mg total) by mouth 2 (two) times daily before a meal. (Patient not taking: Reported on 08/03/2023) 28 tablet 0   rosuvastatin (CRESTOR) 10 MG tablet TAKE 1 TABLET(10 MG) BY MOUTH DAILY (Patient not taking: Reported on 08/03/2023) 90 tablet 3   pantoprazole (PROTONIX) 40 MG tablet Take 1 tablet (40 mg total) by mouth 2 (two) times daily. Take 1 twice daily with meals for 8 weeks.  Then change to once daily with meal and continue 60 tablet 6   sucralfate (CARAFATE) 1 g tablet Take 1 tablet (1 g total) by mouth 4 (four) times daily. 120 tablet 1   No facility-administered medications prior to visit.   No Known Allergies   Objective:   Today's Vitals   08/03/23 1046  BP: 120/68  Pulse: 80  Temp: 98.1 F (36.7 C)  TempSrc: Temporal  SpO2: 100%  Weight: 195 lb 9.6 oz (88.7 kg)  Height: 5\' 8"  (1.727 m)   Body mass index is 29.74 kg/m.   General: Well developed, well nourished. No acute distress. Extremities: The RUE has normal ROM and strength is 5/5. He has normal sensaiton. Psych: Alert and oriented. Normal mood and affect.  Health Maintenance Due  Topic Date Due   OPHTHALMOLOGY EXAM  Never done   Hepatitis C Screening  Never done   DTaP/Tdap/Td (1 - Tdap) Never done   Zoster Vaccines- Shingrix (1 of 2) Never done   Pneumonia Vaccine 56+ Years old (2 of 2 - PCV) 04/20/2016   FOOT EXAM  08/07/2017   Colonoscopy  10/24/2018   Medicare Annual Wellness (AWV)  09/21/2020   HEMOGLOBIN A1C  06/03/2022   Diabetic kidney evaluation - Urine ACR  08/08/2022      Assessment & Plan:   Problem List Items Addressed This Visit       Cardiovascular and Mediastinum   Chronic systolic heart failure (HCC)    Compensated. I attempted to explain to Mr. Forbush and his sister that the  valsartan and metoprolol are being prescribed to him for management of his chronic heart failure and not necessarily for control of hypertension. Mr. Reister expresses doubt as to the necessity of these medicines as he feels his heart is doing well. Mr. Ellena has often expressed doubts about the necessity of treatment or even that he has certain health issues. This remains a challenge in providing him with standard care. I recommend that he resume his metoprolol. He should follow up with his cardiologist if he has remaining concerns.      Coronary artery disease involving coronary bypass graft of native heart without angina pectoris    Stable. No chest pain. Continue clopidogrel 75 mg daily. Mr. Loerch states he has been mismanaged, as he developed GI bleeding while on aspirin. I explained that because of his prior heart attack, the aspirin was appropriately prescribed. However, in light of his bleeding it is appropriate that we now hold this.        Digestive   Duodenal ulcer - Primary    Recent GI bleed with finding of duodenal ulcers on EGD. I recommend that Mr. Hur restart his pantoprazole, as reducing acid would be important for healing of his ulcers. I attempted to reassure him and his sister that although there can be drug interactions between clopidogrel and PPIs, that this is less often seen with pantoprazole and that his ulcer issue and history of CAD make both of these necessary therapies.        Endocrine   Poorly controlled type 2 diabetes mellitus with neuropathy (HCC)    I will check on his blood sugar and A1c today. Mr. Disanti remains skeptical about having diabetes, despite repeated abnormal test results. He states he never eats sugar, so how can he have diabetes. His sister has been checking some fingerstick glucoses at home and notes his are typically < 130.      Relevant Orders   Hemoglobin A1c   Basic metabolic panel     Other   Acute blood loss anemia    I will reassess  his CBC today.      Relevant Orders   CBC   VITAMIN D 25 Hydroxy (Vit-D Deficiency, Fractures)   Hyperlipidemia, unspecified    I will reassess lipids today. I did explain that because of his prior heart attack, he should remain on a statin to lower his risk for having another. Mr. Braford states  his heart attack was years ago and he feels fine. He doesn't see the need for this.      Relevant Orders   Lipid panel    Return in about 4 weeks (around 08/31/2023) for Reassessment.   Loyola Mast, MD

## 2023-08-03 NOTE — Assessment & Plan Note (Signed)
I will check on his blood sugar and A1c today. Nicolas Ray remains skeptical about having diabetes, despite repeated abnormal test results. He states he never eats sugar, so how can he have diabetes. His sister has been checking some fingerstick glucoses at home and notes his are typically < 130.

## 2023-08-03 NOTE — Assessment & Plan Note (Signed)
Recent GI bleed with finding of duodenal ulcers on EGD. I recommend that Nicolas Ray restart his pantoprazole, as reducing acid would be important for healing of his ulcers. I attempted to reassure him and his sister that although there can be drug interactions between clopidogrel and PPIs, that this is less often seen with pantoprazole and that his ulcer issue and history of CAD make both of these necessary therapies.

## 2023-08-03 NOTE — Assessment & Plan Note (Signed)
Stable. No chest pain. Continue clopidogrel 75 mg daily. Nicolas Ray states he has been mismanaged, as he developed GI bleeding while on aspirin. I explained that because of his prior heart attack, the aspirin was appropriately prescribed. However, in light of his bleeding it is appropriate that we now hold this.

## 2023-08-03 NOTE — Assessment & Plan Note (Signed)
I will reassess lipids today. I did explain that because of his prior heart attack, he should remain on a statin to lower his risk for having another. Nicolas Ray states his heart attack was years ago and he feels fine. He doesn't see the need for this.

## 2023-08-12 DIAGNOSIS — E113393 Type 2 diabetes mellitus with moderate nonproliferative diabetic retinopathy without macular edema, bilateral: Secondary | ICD-10-CM | POA: Diagnosis not present

## 2023-08-12 DIAGNOSIS — H35051 Retinal neovascularization, unspecified, right eye: Secondary | ICD-10-CM | POA: Diagnosis not present

## 2023-08-12 DIAGNOSIS — H43813 Vitreous degeneration, bilateral: Secondary | ICD-10-CM | POA: Diagnosis not present

## 2023-08-12 DIAGNOSIS — H353211 Exudative age-related macular degeneration, right eye, with active choroidal neovascularization: Secondary | ICD-10-CM | POA: Diagnosis not present

## 2023-08-13 ENCOUNTER — Telehealth: Payer: Self-pay | Admitting: Family Medicine

## 2023-08-13 NOTE — Telephone Encounter (Signed)
Pt stated he is having some weakness in his right hand and feels it is due to his metroNIDAZOLE (FLAGYL) 250 MG tablet [[id number]] he is asking do you want him to continue taking he has 12 left. It looks like Dr Veto Kemps did not prescribe.  Please call [phone]

## 2023-08-13 NOTE — Telephone Encounter (Signed)
Spoke to patient, he has noticed some wekness in his grip in the RT hand.  He stopped the Metronidazole for a few days and it got better.  He then started taking it again and the same pain is happening again.   Should he s topped taking this completely,  Please  review and advise.  Thanks. Dm/cma

## 2023-08-14 NOTE — Telephone Encounter (Signed)
Patient notified VIA phone and will finish the coarse of antibiotics. Dm/cma

## 2023-09-10 DIAGNOSIS — R310 Gross hematuria: Secondary | ICD-10-CM | POA: Diagnosis not present

## 2023-09-17 DIAGNOSIS — R3914 Feeling of incomplete bladder emptying: Secondary | ICD-10-CM | POA: Diagnosis not present

## 2023-09-17 DIAGNOSIS — N139 Obstructive and reflux uropathy, unspecified: Secondary | ICD-10-CM | POA: Diagnosis not present

## 2023-09-21 ENCOUNTER — Encounter: Payer: Self-pay | Admitting: Family Medicine

## 2023-09-21 DIAGNOSIS — N139 Obstructive and reflux uropathy, unspecified: Secondary | ICD-10-CM | POA: Insufficient documentation

## 2023-09-23 DIAGNOSIS — H35051 Retinal neovascularization, unspecified, right eye: Secondary | ICD-10-CM | POA: Diagnosis not present

## 2023-09-26 ENCOUNTER — Other Ambulatory Visit: Payer: Self-pay

## 2023-09-26 ENCOUNTER — Emergency Department (HOSPITAL_COMMUNITY)
Admission: EM | Admit: 2023-09-26 | Discharge: 2023-09-26 | Discharge status: Against Medical Advice | Payer: 59 | Attending: Emergency Medicine | Admitting: Emergency Medicine

## 2023-09-26 ENCOUNTER — Encounter (HOSPITAL_COMMUNITY): Payer: Self-pay

## 2023-09-26 DIAGNOSIS — Y732 Prosthetic and other implants, materials and accessory gastroenterology and urology devices associated with adverse incidents: Secondary | ICD-10-CM | POA: Insufficient documentation

## 2023-09-26 DIAGNOSIS — Z5321 Procedure and treatment not carried out due to patient leaving prior to being seen by health care provider: Secondary | ICD-10-CM | POA: Insufficient documentation

## 2023-09-26 DIAGNOSIS — T83031A Leakage of indwelling urethral catheter, initial encounter: Secondary | ICD-10-CM | POA: Diagnosis not present

## 2023-09-26 NOTE — ED Triage Notes (Signed)
Pt. Arrives POV for leaking around his urinary catheter. Pt. Does not know where the catheter is leaking from. Believes that it's coming from his bag

## 2023-09-26 NOTE — ED Provider Notes (Signed)
Patient eloped prior to me seeing him.  He was seen in triage was having issues with his Foley catheter per triage note.  They felt like they were able to fix the Foley catheter and patient left without being seen.   Virgina Norfolk, DO 09/26/23 [phone]

## 2023-09-28 ENCOUNTER — Telehealth: Payer: Self-pay | Admitting: Family Medicine

## 2023-09-28 NOTE — Telephone Encounter (Signed)
Pt called in stating both of his ankles have been hurting him for several days. I offered an appt he wanted me to put a message in. Please advise pt at [phone]. He really wants a cb sometime today. He said he has called up here 2x's already.  He is wanting a script for pain.

## 2023-09-28 NOTE — Telephone Encounter (Signed)
Called and spoke to patient, he stated for the last 2-3 nights.  Wants to know if he can get something or what you recommend for this?  Also he is only taking the Lisinopril, Valsartan, and Plavix.  Dm/cma

## 2023-09-29 ENCOUNTER — Ambulatory Visit: Payer: 59 | Admitting: Cardiovascular Disease

## 2023-09-29 NOTE — Telephone Encounter (Signed)
Patient notified VIA phone and has an appointment scheduled for -31-25 @ 11:00 am. Dm/cma

## 2023-10-01 ENCOUNTER — Telehealth: Payer: Self-pay | Admitting: Podiatry

## 2023-10-01 NOTE — Telephone Encounter (Signed)
Pt got transferred over from [facility] and pt states this is the 3rd time and no one has called him back. He says we keep billing pt and he was told he did not owe anything when he came into the office. Please call pt to discuss further.

## 2023-10-02 ENCOUNTER — Telehealth: Payer: Self-pay

## 2023-10-02 ENCOUNTER — Ambulatory Visit (INDEPENDENT_AMBULATORY_CARE_PROVIDER_SITE_OTHER): Payer: 59 | Admitting: Family Medicine

## 2023-10-02 VITALS — BP 130/76 | HR 74 | Temp 97.8°F | Ht 68.0 in | Wt 184.6 lb

## 2023-10-02 DIAGNOSIS — G4762 Sleep related leg cramps: Secondary | ICD-10-CM | POA: Diagnosis not present

## 2023-10-02 DIAGNOSIS — N184 Chronic kidney disease, stage 4 (severe): Secondary | ICD-10-CM | POA: Diagnosis not present

## 2023-10-02 DIAGNOSIS — E1140 Type 2 diabetes mellitus with diabetic neuropathy, unspecified: Secondary | ICD-10-CM

## 2023-10-02 DIAGNOSIS — E039 Hypothyroidism, unspecified: Secondary | ICD-10-CM

## 2023-10-02 LAB — BASIC METABOLIC PANEL
BUN: 51 mg/dL — ABNORMAL HIGH (ref 6–23)
CO2: 20 meq/L (ref 19–32)
Calcium: 9.2 mg/dL (ref 8.4–10.5)
Chloride: 109 meq/L (ref 96–112)
Creatinine, Ser: 2.27 mg/dL — ABNORMAL HIGH (ref 0.40–1.50)
GFR: 28.12 mL/min — ABNORMAL LOW (ref >60.00)
Glucose, Bld: 108 mg/dL — ABNORMAL HIGH (ref 70–99)
Potassium: 4.9 meq/L (ref 3.5–5.1)
Sodium: 137 meq/L (ref 135–145)

## 2023-10-02 LAB — TSH: TSH: 3.45 u[IU]/mL (ref 0.35–5.50)

## 2023-10-02 LAB — VITAMIN D 25 HYDROXY (VIT D DEFICIENCY, FRACTURES): VITD: >120 ng/mL

## 2023-10-02 MED ORDER — CYCLOBENZAPRINE HCL 10 MG PO TABS: 10.0000 mg | ORAL_TABLET | Freq: Three times a day (TID) | ORAL | 0 refills | Status: DC | PRN

## 2023-10-02 NOTE — Assessment & Plan Note (Signed)
Most recent A1c was normal. This may reflect improvements secondary to his kidney disease. The leg cramps may be an aspect of his neuropathy, but Nicolas Ray does not believe he has diabetes.

## 2023-10-02 NOTE — Progress Notes (Signed)
[facility] [address] [place] [address] Dept: [phone] Dept Fax: [phone]  Office Visit  Subjective:    Patient ID: Nicolas Ray, male    DOB: 03-31-1951, 73 y.o..   MRN: [id number]  Chief Complaint  Patient presents with   Leg Pain    C/o having bilateral leg cramps x 3 days.  Has tried dringkin pickle juice with some relief.    History of Present Illness:  Patient is in today complaining of a 3-day history of nocturnal leg cramps. He does admit to knotting up of the muscle of the calf that causes him to need to get up and walk this out. His sister advised him to use pickle juice. He thinks this may have helped some. He also took several doses of his sister's potassium supplement, but isn't sure if this helped. Nicolas Ray has a history of chronic heart failure, CAD, hypertension, hypothyroidism, diabetes (which he disputes), and CKD. 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 He currently has a Foley catheter in place. He notes that he has stopped taking all of his medicines, except for his clopidogrel, levothyroxine, and valsartan (which he notes he only takes about once a week).  Past Medical History: Patient Active Problem List   Diagnosis Date Noted   Obstructive and reflux uropathy, unspecified 09/21/2023   Acute blood loss anemia 08/03/2023   Chronic gastritis 07/26/2023   Duodenal ulcer 07/18/2023   Platelet inhibition due to Plavix 07/18/2023   GI bleed 07/17/2023   Right nephrolithiasis 07/14/2023   Nut allergy 06/01/2023   Gross hematuria 06/01/2023   Primary insomnia 06/01/2023   HFrEF (heart failure with reduced ejection fraction) (HCC) 12/09/2022   Ischemic cardiomyopathy 11/19/2022   Peripheral polyneuropathy 12/02/2021   CKD (chronic kidney disease) stage 4, GFR 15-29 ml/min (HCC) 08/08/2021   Hyperlipidemia, unspecified 08/08/2021   Hypertensive heart disease  with heart failure (HCC) 08/08/2021   Lower urinary tract symptoms 08/08/2021   Smoker 08/08/2021   Stented coronary artery 08/08/2021   History of ST elevation myocardial infarction (STEMI), of lateral wall 08/08/2021   OSA (obstructive sleep apnea) 09/21/2018   BPH with urinary obstruction 04/14/2016   Bilateral hydronephrosis 01/08/2016   Urinary retention 01/08/2016   Coronary artery disease involving coronary bypass graft of native heart without angina pectoris    Type 2 diabetes mellitus with diabetic neuropathy, unspecified (HCC) 04/19/2015   Chronic systolic heart failure (HCC) 04/19/2015   Other male erectile dysfunction 04/18/2014   Sciatica 03/15/2013   Hypothyroidism 02/08/2013   Essential hypertension 02/08/2013   Depression 01/25/2013   Low back pain 01/25/2013   Past Surgical History:  Procedure Laterality Date   BIOPSY  07/18/2023   Procedure: BIOPSY;  Surgeon: Jenel Lucks, MD;  Location: [facility] ENDOSCOPY;  Service: Gastroenterology;;   CARDIAC CATHETERIZATION N/A 04/18/2015   Procedure: Left Heart Cath and Coronary Angiography;  Surgeon: Tonny Bollman, MD;  Location: [facility] INVASIVE CV LAB;  Service: Cardiovascular;  Laterality: N/A;   CARDIAC CATHETERIZATION N/A 04/18/2015   Procedure: Coronary Stent Intervention;  Surgeon: Tonny Bollman, MD;  Location: [facility] INVASIVE CV LAB;  Service: Cardiovascular;  Laterality: N/A;   CARDIAC CATHETERIZATION N/A 04/20/2015   Procedure: Coronary Stent Intervention;  Surgeon: Kathleene Hazel, MD;  Location: [facility] INVASIVE CV LAB;  Service: Cardiovascular;  Laterality: N/A;   CORONARY STENT PLACEMENT     ESOPHAGOGASTRODUODENOSCOPY N/A 07/18/2023   Procedure: ESOPHAGOGASTRODUODENOSCOPY (EGD);  Surgeon: Tomasa Rand,  Dub Amis, MD;  Location: [facility] ENDOSCOPY;  Service: Gastroenterology;  Laterality: N/A;   RIGHT/LEFT HEART CATH AND CORONARY ANGIOGRAPHY N/A 12/09/2022   Procedure: RIGHT/LEFT HEART CATH AND CORONARY ANGIOGRAPHY;  Surgeon:  Tonny Bollman, MD;  Location: [facility] INVASIVE CV LAB;  Service: Cardiovascular;  Laterality: N/A;   Family History  Problem Relation Age of Onset   Heart disease Mother    Cancer Father        Neck   Thyroid disease Sister    Stroke Brother    Colon cancer Neg Hx    Diabetes Neg Hx    Outpatient Medications Prior to Visit  Medication Sig Dispense Refill   clopidogrel (PLAVIX) 75 MG tablet TAKE 1 TABLET(75 MG) BY MOUTH DAILY OK to restart this medication on 07/22/2023 90 tablet 3   levothyroxine (SYNTHROID) 150 MCG tablet Take 1 tablet (150 mcg total) by mouth daily before breakfast. 90 tablet 3   valsartan (DIOVAN) 80 MG tablet Take 1 tablet (80 mg total) by mouth daily. 90 tablet 3   alfuzosin (UROXATRAL) 10 MG 24 hr tablet Take 10 mg by mouth daily. (Patient not taking: Reported on 10/02/2023)     Bismuth 262 MG CHEW Chew 524 mg by mouth in the morning, at noon, in the evening, and at bedtime. (Patient not taking: Reported on 10/02/2023) 112 tablet 0   doxycycline (VIBRAMYCIN) 100 MG capsule Take 1 capsule (100 mg total) by mouth 2 (two) times daily. (Patient not taking: Reported on 10/02/2023) 28 capsule 0   finasteride (PROSCAR) 5 MG tablet Take 5 mg by mouth daily. (Patient not taking: Reported on 10/02/2023)     gabapentin (NEURONTIN) 300 MG capsule Take 1 capsule (300 mg total) by mouth at bedtime. (Patient not taking: Reported on 10/02/2023) 90 capsule 3   metoprolol succinate (TOPROL-XL) 25 MG 24 hr tablet Take 1 tablet (25 mg total) by mouth in the morning. (Patient not taking: Reported on 08/03/2023) 90 tablet 3   metroNIDAZOLE (FLAGYL) 250 MG tablet Take 1 tablet (250 mg total) by mouth 4 (four) times daily. (Patient not taking: Reported on 10/02/2023) 56 tablet 0   nitroGLYCERIN (NITROSTAT) 0.4 MG SL tablet Dissolve 1 tablet under the tongue every 5 minutes as needed for chest pain. Max of 3 doses, then 911. (Patient not taking: Reported on 10/02/2023) 25 tablet 5   pantoprazole  (PROTONIX) 40 MG tablet Take 1 tablet (40 mg total) by mouth 2 (two) times daily before a meal. (Patient not taking: Reported on 10/02/2023) 28 tablet 0   rosuvastatin (CRESTOR) 10 MG tablet TAKE 1 TABLET(10 MG) BY MOUTH DAILY (Patient not taking: No sig reported) 90 tablet 3   No facility-administered medications prior to visit.   No Known Allergies   Objective:   Today's Vitals   10/02/23 1106  BP: 130/76  Pulse: 74  Temp: 97.8 F (36.6 C)  TempSrc: Temporal  SpO2: 99%  Weight: 184 lb 9.6 oz (83.7 kg)  Height: 5\' 8"  (1.727 m)   Body mass index is 28.07 kg/m.   General: Well developed, well nourished. No acute distress. Extremities: Full ROM. No joint swelling or tenderness. No skin changes. No edema noted. Psych: Alert and oriented. Normal mood and affect.  Health Maintenance Due  Topic Date Due   COVID-19 Vaccine (1) Never done   OPHTHALMOLOGY EXAM  Never done   Hepatitis C Screening  Never done   DTaP/Tdap/Td (1 - Tdap) Never done   Zoster Vaccines- Shingrix (1 of 2) Never  done   Pneumonia Vaccine 88+ Years old (2 of 2 - PCV) 04/20/2016   FOOT EXAM  08/07/2017   Colonoscopy  10/24/2018   Medicare Annual Wellness (AWV)  09/21/2020   Diabetic kidney evaluation - Urine ACR  08/08/2022   Lab Results Last hemoglobin A1c Lab Results  Component Value Date   HGBA1C 5.8 08/03/2023     Assessment & Plan:   Problem List Items Addressed This Visit       Endocrine   Hypothyroidism   I will check his TSH today. If he is not at goal, hypothyroidism could be a cause of his cramps.      Relevant Orders   TSH   Type 2 diabetes mellitus with diabetic neuropathy, unspecified (HCC)   Most recent A1c was normal. This may reflect improvements secondary to his kidney disease. The leg cramps may be an aspect of his neuropathy, but Mr. Egnor does not believe he has diabetes.      Relevant Orders   Basic metabolic panel     Genitourinary   CKD (chronic kidney disease) stage  4, GFR 15-29 ml/min (HCC)   I will reassess his renal function now that he has had his urinary obstruction relieved. I  ill also check his potassium level.      Relevant Orders   Basic metabolic panel   VITAMIN D 25 Hydroxy (Vit-D Deficiency, Fractures)   Other Visit Diagnoses       Nocturnal leg cramps    -  Primary   Likely due to his chronic health issues. I will screen for potential causes. Recommend he try tonic water at bedtime. I will also provide cyclbenzaprine PRN.   Relevant Medications   cyclobenzaprine (FLEXERIL) 10 MG tablet   Other Relevant Orders   VITAMIN D 25 Hydroxy (Vit-D Deficiency, Fractures)       Return if symptoms worsen or fail to improve.   Loyola Mast, MD

## 2023-10-02 NOTE — Assessment & Plan Note (Signed)
I will check his TSH today. If he is not at goal, hypothyroidism could be a cause of his cramps.

## 2023-10-02 NOTE — Assessment & Plan Note (Signed)
I will reassess his renal function now that he has had his urinary obstruction relieved. I  ill also check his potassium level.

## 2023-10-02 NOTE — Telephone Encounter (Signed)
CRITICAL VALUE STICKER  CRITICAL VALUE: Vit D >120  RECEIVER (on-site recipient of call): Khristie Sak  DATE & TIME NOTIFIED: 10/02/23 @ 1624  MESSENGER (representative from lab): Hope  MD NOTIFIED: Rudd  TIME OF NOTIFICATION: 1629  RESPONSE:  MD notified for review

## 2023-10-05 ENCOUNTER — Ambulatory Visit: Payer: Self-pay | Admitting: Family Medicine

## 2023-10-05 NOTE — Telephone Encounter (Addendum)
Attempt to call patient back for triage, he states he had requested a call back from Dr. Vickii Penna nurse specifically and was told they would call him back within an hour. Informed patient as a triage nurse I can assist with triaging him for symptoms and book an appointment with his PCP as needed. He states he would like a call back from Dr [employer] nurse. Called [facility] and spoke with staff, informed sent CRM high priority. Reason for Disposition  [1] Caller requests to speak ONLY to PCP AND [2] URGENT question  Answer Assessment - Initial Assessment Questions 1. REASON FOR CALL or QUESTION: "What is your reason for calling today?" or "How can I best help you?" or "What question do you have that I can help answer?"     Patient states he would only like to talk to Dr Rudd's nurse.  2. CALLER: Document the source of call. (e.g., laboratory, patient).     Patient.  Protocols used: PCP Call - No Triage-A-AH

## 2023-10-05 NOTE — Telephone Encounter (Signed)
Patient scheduled for an appointment for 10/07/23@ 11:00 am.  Dm/cma

## 2023-10-07 ENCOUNTER — Encounter: Payer: Self-pay | Admitting: Family Medicine

## 2023-10-07 ENCOUNTER — Ambulatory Visit (INDEPENDENT_AMBULATORY_CARE_PROVIDER_SITE_OTHER): Payer: 59 | Admitting: Family Medicine

## 2023-10-07 VITALS — BP 120/72 | HR 104 | Temp 98.2°F | Ht 68.0 in | Wt 184.0 lb

## 2023-10-07 DIAGNOSIS — J069 Acute upper respiratory infection, unspecified: Secondary | ICD-10-CM

## 2023-10-07 MED ORDER — HYDROCODONE BIT-HOMATROP MBR 5-1.5 MG/5ML PO SOLN: 5.0000 mL | Freq: Three times a day (TID) | ORAL | 0 refills | Status: DC | PRN

## 2023-10-07 NOTE — Progress Notes (Signed)
 [facility] [address] [place] [address] Dept: [phone] Dept Fax: [phone]  Office Visit  Subjective:    Patient ID: Nicolas Ray, male    DOB: [birth year], 73 y.o..   MRN: [id number]  Chief Complaint  Patient presents with   Cough    C/o having a cough and scratchy throat x 2-3 days.  Has taken Dylsem.    History of Present Illness:  Patient is in today with a 2-3 day history of a scratchy throat and cough. He had called the clinic requesting to have a medicine called in for him. He expresses dissatisfaction with being required to come to the clinic for evaluation. He denies any fever. He does admit to mild runny nose. He denies sore throat, noting this is different than a scratchy throat. He has not been coughing anything up. Since I wouldn't call in a better medicine, he has been using Delsym  and found it helped him about 30%. Mr. Wortley does admit to occasional cigarette smoking, though he notes he has note smoked int eh past 2 weeks.  Past Medical History: Patient Active Problem List   Diagnosis Date Noted   Obstructive and reflux uropathy, unspecified 09/21/2023   Acute blood loss anemia 08/03/2023   Chronic gastritis 07/26/2023   Duodenal ulcer 07/18/2023   Platelet inhibition due to Plavix  07/18/2023   GI bleed 07/17/2023   Right nephrolithiasis 07/14/2023   Nut allergy 06/01/2023   Gross hematuria 06/01/2023   Primary insomnia 06/01/2023   HFrEF (heart failure with reduced ejection fraction) (HCC) 12/09/2022   Ischemic cardiomyopathy 11/19/2022   Peripheral polyneuropathy 12/02/2021   CKD (chronic kidney disease) stage 4, GFR 15-29 ml/min (HCC) 08/08/2021   Hyperlipidemia, unspecified 08/08/2021   Hypertensive heart disease with heart failure (HCC) 08/08/2021   Lower urinary tract symptoms 08/08/2021   Smoker 08/08/2021   Stented coronary artery 08/08/2021   History of ST elevation myocardial  infarction (STEMI), of lateral wall 08/08/2021   OSA (obstructive sleep apnea) 09/21/2018   BPH with urinary obstruction 04/14/2016   Bilateral hydronephrosis 01/08/2016   Urinary retention 01/08/2016   Coronary artery disease involving coronary bypass graft of native heart without angina pectoris    Type 2 diabetes mellitus with diabetic neuropathy, unspecified (HCC) 04/19/2015   Chronic systolic heart failure (HCC) 04/19/2015   Other male erectile dysfunction 04/18/2014   Sciatica 03/15/2013   Hypothyroidism 02/08/2013   Essential hypertension 02/08/2013   Depression 01/25/2013   Low back pain 01/25/2013   Past Surgical History:  Procedure Laterality Date   BIOPSY  07/18/2023   Procedure: BIOPSY;  Surgeon: Stacia Glendia BRAVO, MD;  Location: [facility] ENDOSCOPY;  Service: Gastroenterology;;   CARDIAC CATHETERIZATION N/A 04/18/2015   Procedure: Left Heart Cath and Coronary Angiography;  Surgeon: Ozell Fell, MD;  Location: [facility] INVASIVE CV LAB;  Service: Cardiovascular;  Laterality: N/A;   CARDIAC CATHETERIZATION N/A 04/18/2015   Procedure: Coronary Stent Intervention;  Surgeon: Ozell Fell, MD;  Location: [facility] INVASIVE CV LAB;  Service: Cardiovascular;  Laterality: N/A;   CARDIAC CATHETERIZATION N/A 04/20/2015   Procedure: Coronary Stent Intervention;  Surgeon: Lonni JONETTA Cash, MD;  Location: [facility] INVASIVE CV LAB;  Service: Cardiovascular;  Laterality: N/A;   CORONARY STENT PLACEMENT     ESOPHAGOGASTRODUODENOSCOPY N/A 07/18/2023   Procedure: ESOPHAGOGASTRODUODENOSCOPY (EGD);  Surgeon: Stacia Glendia BRAVO, MD;  Location: [facility] ENDOSCOPY;  Service: Gastroenterology;  Laterality: N/A;   RIGHT/LEFT HEART CATH AND CORONARY ANGIOGRAPHY N/A 12/09/2022   Procedure:  RIGHT/LEFT HEART CATH AND CORONARY ANGIOGRAPHY;  Surgeon: Wonda Sharper, MD;  Location: [facility] INVASIVE CV LAB;  Service: Cardiovascular;  Laterality: N/A;   Family History  Problem Relation Age of Onset   Heart disease Mother     Cancer Father        Neck   Thyroid  disease Sister    Stroke Brother    Colon cancer Neg Hx    Diabetes Neg Hx    Outpatient Medications Prior to Visit  Medication Sig Dispense Refill   clopidogrel  (PLAVIX ) 75 MG tablet TAKE 1 TABLET(75 MG) BY MOUTH DAILY OK to restart this medication on 07/22/2023 90 tablet 3   cyclobenzaprine  (FLEXERIL ) 10 MG tablet Take 1 tablet (10 mg total) by mouth 3 (three) times daily as needed for muscle spasms. 30 tablet 0   levothyroxine  (SYNTHROID ) 150 MCG tablet Take 1 tablet (150 mcg total) by mouth daily before breakfast. 90 tablet 3   valsartan  (DIOVAN ) 80 MG tablet Take 1 tablet (80 mg total) by mouth daily. 90 tablet 3   No facility-administered medications prior to visit.   No Known Allergies   Objective:   Today's Vitals   10/07/23 1035  BP: 120/72  Pulse: (!) 104  Temp: 98.2 F (36.8 C)  TempSrc: Temporal  SpO2: 95%  Weight: 184 lb (83.5 kg)  Height: 5' 8 (1.727 m)   Body mass index is 27.98 kg/m.   General: Well developed, well nourished. No acute distress. HEENT: Normocephalic, non-traumatic. Conjunctiva clear. External ears normal. EAC and TMs normal bilaterally. Nose    clear without congestion or rhinorrhea. Mucous membranes moist. Oropharynx clear. Good dentition. Neck: Supple. No lymphadenopathy. No thyromegaly. Lungs: Clear to auscultation bilaterally. No wheezing, rales or rhonchi. Psych: Alert and oriented. Normal mood and affect.  Health Maintenance Due  Topic Date Due   OPHTHALMOLOGY EXAM  Never done   Hepatitis C Screening  Never done   DTaP/Tdap/Td (1 - Tdap) Never done   Zoster Vaccines- Shingrix (1 of 2) Never done   Pneumonia Vaccine 15+ Years old (2 of 2 - PCV) 04/20/2016   FOOT EXAM  08/07/2017   Colonoscopy  10/24/2018   Medicare Annual Wellness (AWV)  09/21/2020   Diabetic kidney evaluation - Urine ACR  08/08/2022     Assessment & Plan:   Problem List Items Addressed This Visit       Respiratory    Viral URI with cough - Primary   Discussed home care for viral illness, including rest, pushing fluids, and OTC medications as needed for symptom relief. Recommend hot tea with honey for sore throat symptoms. I will provide a cough syrup for him to try. Follow-up if needed for worsening or persistent symptoms.       Relevant Medications   HYDROcodone  bit-homatropine (HYCODAN) 5-1.5 MG/5ML syrup    Return if symptoms worsen or fail to improve.   Garnette CHRISTELLA Simpler, MD

## 2023-10-07 NOTE — Assessment & Plan Note (Signed)
 Discussed home care for viral illness, including rest, pushing fluids, and OTC medications as needed for symptom relief. Recommend hot tea with honey for sore throat symptoms. I will provide a cough syrup for him to try. Follow-up if needed for worsening or persistent symptoms.

## 2023-10-09 ENCOUNTER — Other Ambulatory Visit (INDEPENDENT_AMBULATORY_CARE_PROVIDER_SITE_OTHER): Payer: 59

## 2023-10-09 ENCOUNTER — Other Ambulatory Visit: Payer: Self-pay

## 2023-10-09 ENCOUNTER — Ambulatory Visit (INDEPENDENT_AMBULATORY_CARE_PROVIDER_SITE_OTHER): Payer: 59 | Admitting: Gastroenterology

## 2023-10-09 ENCOUNTER — Encounter: Payer: Self-pay | Admitting: Gastroenterology

## 2023-10-09 VITALS — BP 124/82 | HR 92 | Ht 68.0 in | Wt 187.2 lb

## 2023-10-09 DIAGNOSIS — K269 Duodenal ulcer, unspecified as acute or chronic, without hemorrhage or perforation: Secondary | ICD-10-CM

## 2023-10-09 DIAGNOSIS — B9681 Helicobacter pylori [H. pylori] as the cause of diseases classified elsewhere: Secondary | ICD-10-CM | POA: Diagnosis not present

## 2023-10-09 DIAGNOSIS — Z1211 Encounter for screening for malignant neoplasm of colon: Secondary | ICD-10-CM | POA: Insufficient documentation

## 2023-10-09 DIAGNOSIS — K209 Esophagitis, unspecified without bleeding: Secondary | ICD-10-CM | POA: Diagnosis not present

## 2023-10-09 DIAGNOSIS — K2950 Unspecified chronic gastritis without bleeding: Secondary | ICD-10-CM

## 2023-10-09 DIAGNOSIS — Z7902 Long term (current) use of antithrombotics/antiplatelets: Secondary | ICD-10-CM

## 2023-10-09 DIAGNOSIS — K2970 Gastritis, unspecified, without bleeding: Secondary | ICD-10-CM | POA: Insufficient documentation

## 2023-10-09 LAB — CBC WITH DIFFERENTIAL/PLATELET
Basophils Absolute: 0 10*3/uL (ref 0.0–0.1)
Basophils Relative: 0.6 % (ref 0.0–3.0)
Eosinophils Absolute: 0.3 10*3/uL (ref 0.0–0.7)
Eosinophils Relative: 5.4 % — ABNORMAL HIGH (ref 0.0–5.0)
HCT: 38.8 % — ABNORMAL LOW (ref 39.0–52.0)
Hemoglobin: 12 g/dL — ABNORMAL LOW (ref 13.0–17.0)
Lymphocytes Relative: 25 % (ref 12.0–46.0)
Lymphs Abs: 1.2 10*3/uL (ref 0.7–4.0)
MCHC: 30.8 g/dL (ref 30.0–36.0)
MCV: 69.7 fL — ABNORMAL LOW (ref 78.0–100.0)
Monocytes Absolute: 0.5 10*3/uL (ref 0.1–1.0)
Monocytes Relative: 11.6 % (ref 3.0–12.0)
Neutro Abs: 2.7 10*3/uL (ref 1.4–7.7)
Neutrophils Relative %: 57.4 % (ref 43.0–77.0)
Platelets: 144 10*3/uL — ABNORMAL LOW (ref 150.0–400.0)
RBC: 5.56 Mil/uL (ref 4.22–5.81)
RDW: 15.1 % (ref 11.5–15.5)
WBC: 4.7 10*3/uL (ref 4.0–10.5)

## 2023-10-09 MED ORDER — PANTOPRAZOLE SODIUM 40 MG PO TBEC: 40.0000 mg | DELAYED_RELEASE_TABLET | Freq: Every day | ORAL | 3 refills | Status: DC

## 2023-10-09 NOTE — Progress Notes (Signed)
 10/09/2023 Nicolas Ray [id number] 1950-12-13   HISTORY OF PRESENT ILLNESS: This is a 73 year old male is a patient of Dr. Nancyann.  He is here for hospital follow-up from November.  Was admitted for upper GI bleed.  EGD as below showed grade C esophagitis, duodenal ulcers.  Found to have H. pylori on biopsies and was treated with quadruple therapy as below.  He did not know what he was here for today.  Says that he completed all medications given to him, but does not recall diagnosis of H. pylori specifically.  Says that he feels well.  Denies seeing any black or bloody stools.  No longer on a PPI therapy.  Last colonoscopy was exactly 10 years ago, February 2015 with a repeat recommended in 10 years.  He is on Plavix  for history of coronary artery disease.  No follow-up test was ever performed for H. pylori.  EGD 07/2023:  - LA Grade C reflux esophagitis with no bleeding. - Erosive gastropathy with no bleeding and no stigmata of recent bleeding. Biopsied. - 4 cm hiatal hernia. - Non- bleeding duodenal ulcers with pigmented material. THis is the source of the patient' s upper GI bleed. Because of the small size of the pigmented spot and rapid clinical improvement, the decision was made to manipulate the ulcer. - Erythematous duodenopathy.  Biopsy showed gastric antral and oxyntic mucosa with chronic gastritis and stain for H. pylori was positive.  1) Pantoprazole  40 mg 2 times a day x 14 d (already taking) 2) Pepto Bismol 2 tabs (262 mg each) 4 times a day x 14 d 3) Metronidazole  250 mg 4 times a day x 14 d 4) doxycycline  100 mg 2 times a day x 14 d    Past Medical History:  Diagnosis Date   Acute MI, lateral wall, initial episode of care (HCC) 04/18/2015   CAD (coronary artery disease) 04/18/2015   a. Lat STEMI 8/16 - LHC:  pLAD 90, mLAD 50, D1 100, dLCx 90, OM1 80, pRCA 75, EF 35-45% >> DES to D1;  b. staged PCI 04/20/15 - DES to mid LAD and DES to prox RCA   Depression    DM2 (diabetes  mellitus, type 2) (HCC)    HTN (hypertension) 02/08/2013   Renal Artery US  9/16: Bilateral RAS 1-59%, SMA >70%   Hypothyroidism    Ischemic cardiomyopathy    a. Echo 8/16:  Mild LVH, EF 40-45%, mid-apical anterolateral and apical AK, grade 1 diastolic dysfunction, trivial effusion   OSA (obstructive sleep apnea) 09/21/2018   ST elevation myocardial infarction (STEMI) of lateral wall (HCC) 04/18/2015   Past Surgical History:  Procedure Laterality Date   BIOPSY  07/18/2023   Procedure: BIOPSY;  Surgeon: Stacia Glendia BRAVO, MD;  Location: [facility] ENDOSCOPY;  Service: Gastroenterology;;   CARDIAC CATHETERIZATION N/A 04/18/2015   Procedure: Left Heart Cath and Coronary Angiography;  Surgeon: Ozell Fell, MD;  Location: [facility] INVASIVE CV LAB;  Service: Cardiovascular;  Laterality: N/A;   CARDIAC CATHETERIZATION N/A 04/18/2015   Procedure: Coronary Stent Intervention;  Surgeon: Ozell Fell, MD;  Location[facility] INVASIVE CV LAB;  Service: Cardiovascular;  Laterality: N/A;   CARDIAC CATHETERIZATION N/A 04/20/2015   Procedure: Coronary Stent Intervention;  Surgeon: Lonni JONETTA Cash, MD;  Location: [facility] INVASIVE CV LAB;  Service: Cardiovascular;  Laterality: N/A;   CORONARY STENT PLACEMENT     ESOPHAGOGASTRODUODENOSCOPY N/A 07/18/2023   Procedure: ESOPHAGOGASTRODUODENOSCOPY (EGD);  Surgeon: Stacia Glendia BRAVO, MD;  Location: [facility] ENDOSCOPY;  Service:  Gastroenterology;  Laterality: N/A;   RIGHT/LEFT HEART CATH AND CORONARY ANGIOGRAPHY N/A 12/09/2022   Procedure: RIGHT/LEFT HEART CATH AND CORONARY ANGIOGRAPHY;  Surgeon: Wonda Sharper, MD;  Location: [facility] INVASIVE CV LAB;  Service: Cardiovascular;  Laterality: N/A;    reports that he has been smoking cigarettes. He has never used smokeless tobacco. He reports current alcohol  use. He reports that he does not use drugs. family history includes Cancer in his father; Heart disease in his mother; Stroke in his brother; Thyroid  disease in his sister. No Known  Allergies    Outpatient Encounter Medications as of 10/09/2023  Medication Sig   clopidogrel  (PLAVIX ) 75 MG tablet Take 75 mg by mouth daily.   levothyroxine  (SYNTHROID ) 150 MCG tablet Take 1 tablet (150 mcg total) by mouth daily before breakfast.   [DISCONTINUED] clopidogrel  (PLAVIX ) 75 MG tablet TAKE 1 TABLET(75 MG) BY MOUTH DAILY OK to restart this medication on 07/22/2023 (Patient not taking: Reported on 10/09/2023)   [DISCONTINUED] cyclobenzaprine  (FLEXERIL ) 10 MG tablet Take 1 tablet (10 mg total) by mouth 3 (three) times daily as needed for muscle spasms. (Patient not taking: Reported on 10/09/2023)   [DISCONTINUED] HYDROcodone  bit-homatropine (HYCODAN) 5-1.5 MG/5ML syrup Take 5 mLs by mouth every 8 (eight) hours as needed for cough. (Patient not taking: Reported on 10/09/2023)   [DISCONTINUED] valsartan  (DIOVAN ) 80 MG tablet Take 1 tablet (80 mg total) by mouth daily. (Patient not taking: Reported on 10/09/2023)   No facility-administered encounter medications on file as of 10/09/2023.     REVIEW OF SYSTEMS  : All other systems reviewed and negative except where noted in the History of Present Illness.   PHYSICAL EXAM: BP 124/82 (BP Location: Right Arm, Patient Position: Sitting, Cuff Size: Normal)   Pulse 92   Ht 5' 8 (1.727 m)   Wt 187 lb 4 oz (84.9 kg)   BMI 28.47 kg/m  General: Well developed male in no acute distress Head: Normocephalic and atraumatic Eyes:  Sclerae anicteric, conjunctiva pink. Ears: Normal auditory acuity Lungs: Clear throughout to auscultation; no wheezes rales or rhonchi. Heart: Regular rate and rhythm; no murmurs rubs or gallops. Musculoskeletal: Symmetrical with no gross deformities  Skin: No lesions on visible extremities Extremities: No edema  Neurological: Alert oriented x 4, grossly non-focal Psychological:  Alert and cooperative. Normal mood and affect  ASSESSMENT AND PLAN: *Upper GI bleed/grade C esophagitis/duodenal ulcer/H. Pylori: Had an EGD in  November for upper GI bleed found to have esophagitis, duodenal ulcers, H. pylori.  Was treated with quadruple therapy.  Patient does not recall taking medication for H. pylori, but he says he took all the medication that was given to him.  Plan was to repeat EGD in 8 to 12 weeks.  He says that he feels fine.  Declines to schedule for now, but says that he will discuss with his sisters.  Hopefully he will decide to proceed with EGD at which time then we can repeat biopsies for H. pylori since he is not on any maintenance PPI therapy.  If he does not proceed with EGD then should have a stool for H. pylori antigen, could do a [employer]rix.  Will repeat CBC today, which he was agreeable to. *Colon cancer screening: Last colonoscopy was 10 years ago, February 2015.  He is due for 10-year colonoscopy screening.  He declined to schedule for now.  **He is on Plavix  so if he decides to proceed that will need to be held for 5 days.  It appears that  this is prescribed by his cardiologist, Dr. Wonda.   CC:  Thedora Garnette HERO, MD

## 2023-10-09 NOTE — Progress Notes (Signed)
 Reviewed. This patient must be on a daily PPI indefinitely. Thanks

## 2023-10-09 NOTE — Patient Instructions (Addendum)
 Your provider has requested that you go to the basement level for lab work before leaving today. Press B on the elevator. The lab is located at the first door on the left as you exit the elevator.   It has been recommended to you by your physician that you have a(n) endoscopy/colonoscopy completed. Per your request, we did not schedule the procedure(s) today. Please contact our office at [phone] should you decide to have the procedure completed. You will be scheduled for a pre-visit and procedure at that time.  Colonoscopy due for screening colon cancer as last one was over 10 years ago.  Recommend repeat upper endoscopy to follow up on ulcer and esophagitis and GI bleed from hospitalization in November.  _______________________________________________________  If your blood pressure at your visit was 140/90 or greater, please contact your primary care physician to follow up on this.  _______________________________________________________  If you are age 61 or older, your body mass index should be between 23-30. Your Body mass index is 28.47 kg/m. If this is out of the aforementioned range listed, please consider follow up with your Primary Care Provider.  If you are age 61 or younger, your body mass index should be between 19-25. Your Body mass index is 28.47 kg/m. If this is out of the aformentioned range listed, please consider follow up with your Primary Care Provider.   ________________________________________________________  The [facility] providers would like to encourage you to use MYCHART to communicate with providers for non-urgent requests or questions.  Due to long hold times on the telephone, sending your provider a message by [facility] may be a faster and more efficient way to get a response.  Please allow 48 business hours for a response.  Please remember that this is for non-urgent requests.  _______________________________________________________

## 2023-10-12 DIAGNOSIS — R3914 Feeling of incomplete bladder emptying: Secondary | ICD-10-CM | POA: Diagnosis not present

## 2023-10-19 ENCOUNTER — Other Ambulatory Visit: Payer: Self-pay | Admitting: Urology

## 2023-10-19 ENCOUNTER — Telehealth: Payer: Self-pay | Admitting: Cardiovascular Disease

## 2023-10-19 NOTE — Telephone Encounter (Signed)
   Pre-operative Risk Assessment    Patient Name: Nicolas Ray  DOB: 1950/10/17 MRN: [id number]   Date of last office visit:  Date of next office visit:    Request for Surgical Clearance    Procedure:  Aquablation of the Prostate  Date of Surgery:           12-08-23                    Surgeon:  Dr Jerilee Field Surgeon's Group or Practice Name:   Phone number:  [phone] Fax number:  [phone]   Type of Clearance Requested:   Medical and Medicine- hold Plavix for 5 days    Type of Anesthesia:  General    Additional requests/questions:    Rivka Safer   10/19/2023, 11:18 AM

## 2023-10-20 NOTE — Telephone Encounter (Signed)
   Name: Nicolas Ray  DOB: 1951/03/25  MRN: [id number]  Primary Cardiologist: Tonny Bollman, MD   Preoperative team, please contact this patient and set up a phone call appointment for further preoperative risk assessment. Please obtain consent and complete medication review. Thank you for your help.  I confirm that guidance regarding antiplatelet and oral anticoagulation therapy has been completed and, if necessary, noted below.  Per Dr. Excell Seltzer, patient is at a low risk to hold Plavix x 5 days.   I also confirmed the patient resides in the state of [place]. As per [facility] Medical Board telemedicine laws, the patient must reside in the state in which the provider is licensed.   Carlos Levering, NP 10/20/2023, 1:34 PM [facility]

## 2023-10-20 NOTE — Telephone Encounter (Signed)
Dr. Excell Seltzer,  Nicolas Ray has a history of PCI with long DES x 2 (2.75 x 32 to mid LAD and 4.0 x 28 to proximal RCA) in August 2016. Last LHC in April 2024 showed patent stents with mild in stent restenosis and stable but severe OM1 disease and CTO mid LCx with left to right collaterals. Per office protocol, will you please provide recommendations for holding Plavix prior to prostate aquablation?  Please route your response to P CV DIV Preop. I will communicate with requesting office once you have given recommendations.   Thank you!  Nicolas Levering, NP

## 2023-10-20 NOTE — Telephone Encounter (Signed)
Pt at low risk of holding plavix x 5 days for prostate surgery. OK to proceed. Thank you

## 2023-10-20 NOTE — Telephone Encounter (Signed)
I s/w the pt about tele preop appt 11/03/23. Procedure with Dr. Mena Goes at this time is set for 12/08/23. Pt is trying to get this moved up sooner. Which he asked could he come in the office to be seen sooner for preop clearance.   I d/w the preop APP today and yes ok to schedule in office if pt chooses.   I will update all parties involved. Pt is aware that we are expecting inclement weather tomorrow and that the office maybe closed or open late.

## 2023-10-21 ENCOUNTER — Ambulatory Visit: Payer: 59 | Admitting: Cardiovascular Disease

## 2023-10-23 ENCOUNTER — Ambulatory Visit: Payer: 59 | Attending: Cardiovascular Disease | Admitting: Cardiovascular Disease

## 2023-10-23 ENCOUNTER — Encounter: Payer: Self-pay | Admitting: Cardiovascular Disease

## 2023-10-23 VITALS — BP 130/90 | HR 90 | Ht 68.0 in | Wt 179.0 lb

## 2023-10-23 DIAGNOSIS — I25810 Atherosclerosis of coronary artery bypass graft(s) without angina pectoris: Secondary | ICD-10-CM

## 2023-10-23 DIAGNOSIS — Z01818 Encounter for other preprocedural examination: Secondary | ICD-10-CM

## 2023-10-23 DIAGNOSIS — I10 Essential (primary) hypertension: Secondary | ICD-10-CM | POA: Diagnosis not present

## 2023-10-23 NOTE — Progress Notes (Signed)
Cardiology Office Note:    Date:  10/23/2023   ID:  Nicolas Ray, DOB March 22, 1951, MRN [id number]  PCP:  Loyola Mast, MD   [facility] Providers Cardiologist:  Tonny Bollman, MD     Referring MD: Loyola Mast, MD   Chief Complaint  Patient presents with   Pre-op Exam    History of Present Illness:    Nicolas Ray is a 73 y.o. male with a hx of:  Coronary artery disease  S/p Lat STEMI 8/16 >> s/p DES to D1 Patient refused CABG >> staged PCI: DES to mid LAD and DES to prox RCA Chronic systolic CHF Ischemic CM ACEi DCd in past due to worsening Creatinine  Echocardiogram 8/16: EF 40-45 Echocardiogram 5/17: EF 35-40 Acute pulmonary edema March 2024 Diabetes mellitus  Chronic kidney disease 3 Hypertension  Medical non-adherence  Tobacco use R hemidiaphragm paralysis Mild OSA  The patient is here alone today.  He presents for preoperative cardiovascular evaluation for upcoming prostate surgery.  He is scheduled for transurethral water jet ablation of the prostate on December 08, 2023.  The patient reports no cardiac related complaints at this time.  He only remains on 2 medications, clopidogrel and levothyroxine.  He has had side effects to many medications and we have not been able to keep him on many maintenance medicines.  He states that he is "doing fine from head to toe."  He denies chest pain, chest pressure, or shortness of breath.  He has no leg swelling or heart palpitations.  He has no complaints at this time.  Current Medications: Current Meds  Medication Sig   alfuzosin (UROXATRAL) 10 MG 24 hr tablet Take 10 mg by mouth daily.   clopidogrel (PLAVIX) 75 MG tablet Take 75 mg by mouth daily.   levothyroxine (SYNTHROID) 150 MCG tablet Take 1 tablet (150 mcg total) by mouth daily before breakfast.   [DISCONTINUED] pantoprazole (PROTONIX) 40 MG tablet Take 1 tablet (40 mg total) by mouth daily.     Allergies:   Patient has no known allergies.   ROS:    Please see the history of present illness.    All other systems reviewed and are negative.  EKGs/Labs/Other Studies Reviewed:    The following studies were reviewed today: Cardiac Studies & Procedures   ______________________________________________________________________________________________ CARDIAC CATHETERIZATION  CARDIAC CATHETERIZATION 12/09/2022  Narrative 1.  Continued patency of the left main 2.  Continued patency of the LAD and diagonal stents with mild diffuse in-stent restenosis in both vessels, moderate distal edge restenosis in the LAD and the diagonal 3.  Stable but severe stenosis of a small first OM branch and total occlusion of the mid left circumflex which is now collateralized via left to right collaterals filling the distal OM 4.  Continued patency of the RCA stent with mild diffuse in-stent restenosis and mild to moderate distal RCA and proximal PDA stenoses 5.  Normal LVEDP of 12 mmHg, normal wedge pressure of 10 mmHg, then well compensated hemodynamics with normal right-sided diastolic filling pressures and preserved cardiac output of 7.4 L/min with cardiac index of 3.6 L/min/m  Recommendations: Continued medical therapy  Findings Coronary Findings Diagnostic  Dominance: Right  Left Main There is mild diffuse disease throughout the vessel.  Left Anterior Descending Non-stenotic Prox LAD lesion was previously treated. The lesion is segmental. Mid LAD-1 lesion is 40% stenosed. The lesion is segmental. The lesion was previously treated . Diffuse severe LAD stenosis just beyond the origin of  the first diagonal Mid LAD-2 lesion is 60% stenosed.  First Diagonal Branch 1st Diag-1 lesion is 30% stenosed. The lesion is segmental, thrombotic and heavily thrombotic. The lesion was previously treated . 1st Diag-2 lesion is 60% stenosed.  Left Circumflex Collaterals Dist Cx filled by collaterals from 2nd Sept.  Dist Cx lesion is 100% stenosed. The lesion is  discrete.  First Obtuse Marginal Branch 1st Mrg lesion is 80% stenosed. The lesion is segmental.  Third Obtuse Marginal Branch Collaterals 3rd Mrg filled by collaterals from 2nd Sept.  Right Coronary Artery Prox RCA lesion is 30% stenosed. The lesion is eccentric. The lesion was previously treated . Ulcerated-appearing plaque Prox RCA to Mid RCA lesion is 40% stenosed. Dist RCA lesion is 40% stenosed.  Right Posterior Descending Artery RPDA lesion is 50% stenosed.  Intervention  No interventions have been documented.   CARDIAC CATHETERIZATION  CARDIAC CATHETERIZATION 04/20/2015  Narrative  Prox RCA lesion, 75% stenosed. A drug-eluting stent was placed. There is a 0% residual stenosis post intervention.  Prox LAD lesion, 90% stenosed. A drug eluting stent was placed. There is a 0% residual stenosis post intervention.  A drug-eluting stent was placed.  Recommendations: Continue ASA, Brilinta, statin, beta blocker, Ace-inh. Will continue NTG drip post stent placement given mild chest pressure. EKG post PCI with no ST changes.  Findings Coronary Findings Diagnostic  Dominance: Right  Left Main The vessel is angiographically normal.  Left Anterior Descending diffuse . diffuse .  Diffuse severe LAD stenosis just beyond the origin of the first diagonal  First Diagonal Branch diffuse thrombotic with heavy thrombus .  Previously placed 1st Diag drug eluting stent is patent.  Left Circumflex discrete .  First Obtuse Marginal Branch diffuse .  Right Coronary Artery eccentric .  Ulcerated-appearing plaque  Intervention  Prox LAD lesion PCI (Also treats lesions: Mid LAD) The pre-interventional distal flow is normal (TIMI 3). Pre-stent angioplasty was performed. A drug-eluting stent was placed. The strut is apposed. Post-stent angioplasty was performed. The post-interventional distal flow is normal (TIMI 3). The intervention was successful. No complications occurred at  this lesion. There is a 0% residual stenosis post intervention.  Mid LAD lesion PCI (Also treats lesions: Prox LAD) See details in Prox LAD lesion. There is a 0% residual stenosis post intervention.  Prox RCA lesion PCI The pre-interventional distal flow is normal (TIMI 3). Pre-stent angioplasty was performed. A drug-eluting stent was placed. The strut is apposed. Post-stent angioplasty was performed. The post-interventional distal flow is normal (TIMI 3). The intervention was successful. No complications occurred at this lesion. There is a 0% residual stenosis post intervention.   STRESS TESTS  MYOCARDIAL PERFUSION IMAGING 12/17/2020  Narrative  Nuclear stress EF: 22%.  The left ventricular ejection fraction is severely decreased (<30%).  There was no ST segment deviation noted during stress.  Defect 1: There is a large defect of severe severity present in the basal inferolateral, mid anterior, mid anteroseptal, mid inferolateral, apical anterior, apical septal and apex location.  Findings consistent with prior myocardial infarction.  This is a high risk study.  Abnormal, high risk stress nuclear study with distal septal, apical and inferior lateral infarct.  There is no ischemia noted.  Gated ejection fraction 22% with global hypokinesis and akinesis of the distal anteroseptal wall and apex.  Severe left ventricular enlargement.  Study interpreted as high risk due to reduced LV function.   ECHOCARDIOGRAM  ECHOCARDIOGRAM COMPLETE 01/24/2021  Narrative ECHOCARDIOGRAM REPORT    Patient Name:  [name]   Date of Exam: 01/24/2021 Medical Rec #:  [id number]     Height:       68.0 in Accession #:    [id number]    Weight:       208.0 lb Date of Birth:  10/24/1950      BSA:          2.078 m Patient Age:    69 years      BP:           162/80 mmHg Patient Gender: M             HR:           68 bpm. Exam Location:  [address]  Procedure: 2D Echo, Cardiac Doppler and Color  Doppler  Indications:    I25.10 CAD Native Vessel  History:        Patient has prior history of Echocardiogram examinations, most recent 12/31/2015. Previous Myocardial Infarction and CAD; Risk Factors:Hypertension and Diabetes. Low back pain. Hypothyroidism.  Sonographer:    Cathie Beams RCS Referring Phys: [id number] Yeriel Mineo  IMPRESSIONS   1. Left ventricular ejection fraction, by estimation, is 35 to 40%. The left ventricle has moderately decreased function. The left ventricle demonstrates global hypokinesis. Left ventricular diastolic parameters are consistent with Grade I diastolic dysfunction (impaired relaxation). Elevated left ventricular end-diastolic pressure. There is akinesis of the left ventricular, apical segment. There is akinesis of the left ventricular, apical septal wall. 2. Right ventricular systolic function is normal. The right ventricular size is normal. 3. The mitral valve is normal in structure. Trivial mitral valve regurgitation. No evidence of mitral stenosis. 4. The aortic valve has an indeterminant number of cusps. There is moderate calcification of the aortic valve. Aortic valve regurgitation is not visualized. No aortic stenosis is present. 5. The inferior vena cava is normal in size with greater than 50% respiratory variability, suggesting right atrial pressure of 3 mmHg.  FINDINGS Left Ventricle: Left ventricular ejection fraction, by estimation, is 35 to 40%. The left ventricle has moderately decreased function. The left ventricle demonstrates global hypokinesis. The left ventricular internal cavity size was normal in size. There is no left ventricular hypertrophy. Left ventricular diastolic parameters are consistent with Grade I diastolic dysfunction (impaired relaxation). Elevated left ventricular end-diastolic pressure.  Right Ventricle: The right ventricular size is normal. No increase in right ventricular wall thickness. Right ventricular systolic  function is normal.  Left Atrium: Left atrial size was normal in size.  Right Atrium: Right atrial size was normal in size.  Pericardium: There is no evidence of pericardial effusion.  Mitral Valve: The mitral valve is normal in structure. Trivial mitral valve regurgitation. No evidence of mitral valve stenosis.  Tricuspid Valve: The tricuspid valve is normal in structure. Tricuspid valve regurgitation is not demonstrated. No evidence of tricuspid stenosis.  Aortic Valve: The aortic valve has an indeterminant number of cusps. There is moderate calcification of the aortic valve. Aortic valve regurgitation is not visualized. No aortic stenosis is present. Aortic valve mean gradient measures 6.0 mmHg. Aortic valve peak gradient measures 10.9 mmHg. Aortic valve area, by VTI measures 1.79 cm.  Pulmonic Valve: The pulmonic valve was normal in structure. Pulmonic valve regurgitation is not visualized. No evidence of pulmonic stenosis.  Aorta: The aortic root is normal in size and structure.  Venous: The inferior vena cava is normal in size with greater than 50% respiratory variability, suggesting right atrial pressure of 3 mmHg.  IAS/Shunts: No  atrial level shunt detected by color flow Doppler.   LEFT VENTRICLE PLAX 2D LVIDd:         5.00 cm  Diastology LVIDs:         3.70 cm  LV e' medial:    4.80 cm/s LV PW:         1.10 cm  LV E/e' medial:  22.5 LV IVS:        1.20 cm  LV e' lateral:   4.73 cm/s LVOT diam:     2.20 cm  LV E/e' lateral: 22.8 LV SV:         67 LV SV Index:   32 LVOT Area:     3.80 cm   RIGHT VENTRICLE RV Basal diam:  2.60 cm RV S prime:     8.48 cm/s  LEFT ATRIUM             Index       RIGHT ATRIUM           Index LA diam:        3.40 cm 1.64 cm/m  RA Area:     13.30 cm LA Vol (A2C):   47.3 ml 22.76 ml/m RA Volume:   28.40 ml  13.67 ml/m LA Vol (A4C):   50.1 ml 24.11 ml/m LA Biplane Vol: 49.0 ml 23.58 ml/m AORTIC VALVE AV Area (Vmax):    1.57 cm AV  Area (Vmean):   1.42 cm AV Area (VTI):     1.79 cm AV Vmax:           165.00 cm/s AV Vmean:          117.000 cm/s AV VTI:            0.374 m AV Peak Grad:      10.9 mmHg AV Mean Grad:      6.0 mmHg LVOT Vmax:         68.10 cm/s LVOT Vmean:        43.600 cm/s LVOT VTI:          0.176 m LVOT/AV VTI ratio: 0.47  AORTA Ao Root diam: 3.40 cm  MITRAL VALVE MV Area (PHT): 3.31 cm     SHUNTS MV Decel Time: 229 msec     Systemic VTI:  0.18 m MV E velocity: 108.00 cm/s  Systemic Diam: 2.20 cm MV A velocity: 127.00 cm/s MV E/A ratio:  0.85  Armanda Magic MD Electronically signed by Armanda Magic MD Signature Date/Time: 01/24/2021/4:14:50 PM    Final          ______________________________________________________________________________________________      EKG:        Recent Labs: 05/24/2023: ALT 15 10/02/2023: BUN 51; Creatinine, Ser 2.27; Potassium 4.9; Sodium 137; TSH 3.45 10/09/2023: Hemoglobin 12.0; Platelets 144.0  Recent Lipid Panel    Component Value Date/Time   CHOL 153 08/03/2023 1145   CHOL 106 06/29/2019 0953   TRIG 144.0 08/03/2023 1145   HDL 32.80 (L) 08/03/2023 1145   HDL 36 (L) 06/29/2019 0953   CHOLHDL 5 08/03/2023 1145   VLDL 28.8 08/03/2023 1145   LDLCALC 92 08/03/2023 1145   LDLCALC 50 06/29/2019 0953        Physical Exam:    VS:  BP (!) 130/90   Pulse 90   Ht 5\' 8"  (1.727 m)   Wt 179 lb (81.2 kg)   SpO2 97%   BMI 27.22 kg/m     Wt Readings from Last 3 Encounters:  10/23/23  179 lb (81.2 kg)  10/09/23 187 lb 4 oz (84.9 kg)  10/07/23 184 lb (83.5 kg)     GEN:  Well nourished, well developed in no acute distress HEENT: Normal NECK: No JVD; No carotid bruits LYMPHATICS: No lymphadenopathy CARDIAC: RRR, 2/6 systolic murmur at the RUSB RESPIRATORY:  Clear to auscultation without rales, wheezing or rhonchi  ABDOMEN: Soft, non-tender, non-distended MUSCULOSKELETAL:  No edema; No deformity  SKIN: Warm and dry NEUROLOGIC:  Alert and  oriented x 3 PSYCHIATRIC:  Normal affect   Assessment & Plan Pre-operative clearance The patient is clinically stable with an absence of cardiovascular symptoms at present.  He specifically denies symptoms of angina, heart failure, or arrhythmia at present.  His LVEF is 35 to 40%.  He has had cardiac catheterization within the past year which demonstrated patency of the left main, patency of the LAD and diagonal stents with moderate restenosis at the distal edge of the LAD and diagonal, stable but severe stenosis of a small OM branch and total occlusion of the mid circumflex.  The RCA was widely patent with only mild in-stent restenosis.  The patient was well compensated with normal LVEDP.  Ongoing medical therapy was recommended.  He has developed no anginal symptoms in the interim.  I think he can proceed with urologic surgery at low risk of cardiovascular complications.  No further testing is indicated at this time. Essential hypertension Blood pressure under adequate control.  The patient will not be agreeable with taking any antihypertensive medications.  Continue observation. Coronary artery disease involving coronary bypass graft of native heart without angina pectoris Stable on clopidogrel.  At low risk of holding clopidogrel 5 to 7 days before urologic surgery as needed.     Medication Adjustments/Labs and Tests Ordered: Current medicines are reviewed at length with the patient today.  Concerns regarding medicines are outlined above.  No orders of the defined types were placed in this encounter.  No orders of the defined types were placed in this encounter.   Patient Instructions  Testing/Procedures: **Cleared for upcoming procedure**  Follow-Up: At [facility], you and your health needs are our priority.  As part of our continuing mission to provide you with exceptional heart care, we have created designated Provider Care Teams.  These Care Teams include your primary  Cardiologist (physician) and Advanced Practice Providers (APPs -  Physician Assistants and Nurse Practitioners) who all work together to provide you with the care you need, when you need it.  Your next appointment:   1 year(s)  Provider:   Tonny Bollman, MD      1st Floor: - Lobby - Registration  - Pharmacy  - Lab - Cafe  2nd Floor: - PV Lab - Diagnostic Testing (echo, CT, nuclear med)  3rd Floor: - Vacant  4th Floor: - TCTS (cardiothoracic surgery) - AFib Clinic - Structural Heart Clinic - Vascular Surgery  - Vascular Ultrasound  5th Floor: - HeartCare Cardiology (general and EP) - Clinical Pharmacy for coumadin, hypertension, lipid, weight-loss medications, and med management appointments    Valet parking services will be available as well.     Signed, Tonny Bollman, MD  10/23/2023 4:55 PM    [facility]

## 2023-10-23 NOTE — Assessment & Plan Note (Signed)
Blood pressure under adequate control.  The patient will not be agreeable with taking any antihypertensive medications.  Continue observation.

## 2023-10-23 NOTE — Assessment & Plan Note (Signed)
Stable on clopidogrel.  At low risk of holding clopidogrel 5 to 7 days before urologic surgery as needed.

## 2023-10-23 NOTE — Patient Instructions (Signed)
Testing/Procedures: **Cleared for upcoming procedure**  Follow-Up: At [facility], you and your health needs are our priority.  As part of our continuing mission to provide you with exceptional heart care, we have created designated Provider Care Teams.  These Care Teams include your primary Cardiologist (physician) and Advanced Practice Providers (APPs -  Physician Assistants and Nurse Practitioners) who all work together to provide you with the care you need, when you need it.  Your next appointment:   1 year(s)  Provider:   Tonny Bollman, MD      1st Floor: - Lobby - Registration  - Pharmacy  - Lab - Cafe  2nd Floor: - PV Lab - Diagnostic Testing (echo, CT, nuclear med)  3rd Floor: - Vacant  4th Floor: - TCTS (cardiothoracic surgery) - AFib Clinic - Structural Heart Clinic - Vascular Surgery  - Vascular Ultrasound  5th Floor: - HeartCare Cardiology (general and EP) - Clinical Pharmacy for coumadin, hypertension, lipid, weight-loss medications, and med management appointments    Valet parking services will be available as well.

## 2023-11-09 DIAGNOSIS — R3914 Feeling of incomplete bladder emptying: Secondary | ICD-10-CM | POA: Diagnosis not present

## 2023-11-10 DIAGNOSIS — R3914 Feeling of incomplete bladder emptying: Secondary | ICD-10-CM | POA: Diagnosis not present

## 2023-11-23 DIAGNOSIS — E113312 Type 2 diabetes mellitus with moderate nonproliferative diabetic retinopathy with macular edema, left eye: Secondary | ICD-10-CM | POA: Diagnosis not present

## 2023-11-23 DIAGNOSIS — H35363 Drusen (degenerative) of macula, bilateral: Secondary | ICD-10-CM | POA: Diagnosis not present

## 2023-11-23 DIAGNOSIS — E113391 Type 2 diabetes mellitus with moderate nonproliferative diabetic retinopathy without macular edema, right eye: Secondary | ICD-10-CM | POA: Diagnosis not present

## 2023-11-23 DIAGNOSIS — H3581 Retinal edema: Secondary | ICD-10-CM | POA: Diagnosis not present

## 2023-11-23 DIAGNOSIS — H353211 Exudative age-related macular degeneration, right eye, with active choroidal neovascularization: Secondary | ICD-10-CM | POA: Diagnosis not present

## 2023-11-25 NOTE — Progress Notes (Addendum)
 Anesthesia Review:  PCP: Herbie Drape- LOV 10/07/23  Cardiologist : DR Excell Seltzer preop for cleatrance on 10/23/23   PPM/ ICD: Device Orders: Rep Notified:  Chest x-ray : 07/17/23- 2 view  EKG : 07/20/23  Echo : 01/24/21  Stress test: 12/17/20  Cardiac Cath :  12/09/22   Activity level: can do a flight of stairs without difficutly  Sleep Study/ CPAP : none  Fasting Blood Sugar :      / Checks Blood Sugar -- times a day:    DM- type 2- on no meds pt states at preop he made some lifestyle change and diet changes  Checks glucose on occasin per pt  Hgba1c-   11/30/23- 6.5   Blood Thinner/ Instructions Maurice Small Dose: ASA / Instructions/ Last Dose :    Plavix-  stop 5 days prior per pt    Coronary stent- 2016    Light smoker per pt    BMP with Creatinine of 2.27 routed to Dr Mena Goes done 11/30/23.  Glucose at preop on 11/30/23 was 75.  PT had 100pm appt.  Pt has not yet had lunch.  Returning home after preop appt.  PT asymptomatic.  PT was given peanut butter crackers , apple juice and diet cola by preop nurse for travel home.

## 2023-11-25 NOTE — Patient Instructions (Signed)
 SURGICAL WAITING ROOM VISITATION  Patients having surgery or a procedure may have no more than 2 support people in the waiting area - these visitors may rotate.    Children under the age of 48 must have an adult with them who is not the patient.  Due to an increase in RSV and influenza rates and associated hospitalizations, children ages 49 and under may not visit patients in [facility] hospitals.  Visitors with respiratory illnesses are discouraged from visiting and should remain at home.  If the patient needs to stay at the hospital during part of their recovery, the visitor guidelines for inpatient rooms apply. Pre-op nurse will coordinate an appropriate time for 1 support person to accompany patient in pre-op.  This support person may not rotate.    Please refer to the [facility] website for the visitor guidelines for Inpatients (after your surgery is over and you are in a regular room).       Your procedure is scheduled on:  12/08/2023    Report to [facility] Main Entrance    Report to admitting at  0715 AM   Call this number if you have problems the morning of surgery [phone]   Do not eat food  or drink liquids :After Midnight.              If you have questions, please contact your surgeon's office.       Oral Hygiene is also important to reduce your risk of infection.                                    Remember - BRUSH YOUR TEETH THE MORNING OF SURGERY WITH YOUR REGULAR TOOTHPASTE  DENTURES WILL BE REMOVED PRIOR TO SURGERY PLEASE DO NOT APPLY "Poly grip" OR ADHESIVES!!!   Do NOT smoke after Midnight   Stop all vitamins and herbal supplements 7 days before surgery.   Take these medicines the morning of surgery with A SIP OF WATER:  synthroid   DO NOT TAKE ANY ORAL DIABETIC MEDICATIONS DAY OF YOUR SURGERY  Bring CPAP mask and tubing day of surgery.                              You may not have any metal on your body including hair pins, jewelry,  and body piercing             Do not wear make-up, lotions, powders, perfumes/cologne, or deodorant  Do not wear nail polish including gel and S&S, artificial/acrylic nails, or any other type of covering on natural nails including finger and toenails. If you have artificial nails, gel coating, etc. that needs to be removed by a nail salon please have this removed prior to surgery or surgery may need to be canceled/ delayed if the surgeon/ anesthesia feels like they are unable to be safely monitored.   Do not shave  48 hours prior to surgery.               Men may shave face and neck.   Do not bring valuables to the hospital.  IS NOT             RESPONSIBLE   FOR VALUABLES.   Contacts, glasses, dentures or bridgework may not be worn into surgery.   Bring small overnight bag day of surgery.  DO NOT BRING YOUR HOME MEDICATIONS TO THE HOSPITAL. PHARMACY WILL DISPENSE MEDICATIONS LISTED ON YOUR MEDICATION LIST TO YOU DURING YOUR ADMISSION IN THE HOSPITAL!    Patients discharged on the day of surgery will not be allowed to drive home.  Someone NEEDS to stay with you for the first 24 hours after anesthesia.   Special Instructions: Bring a copy of your healthcare power of attorney and living will documents the day of surgery if you haven't scanned them before.              Please read over the following fact sheets you were given: IF YOU HAVE QUESTIONS ABOUT YOUR PRE-OP INSTRUCTIONS PLEASE CALL [phone]   If you received a COVID test during your pre-op visit  it is requested that you wear a mask when out in public, stay away from anyone that may not be feeling well and notify your surgeon if you develop symptoms. If you test positive for Covid or have been in contact with anyone that has tested positive in the last 10 days please notify you surgeon.     - Preparing for Surgery Before surgery, you can play an important role.  Because skin is not sterile, your skin  needs to be as free of germs as possible.  You can reduce the number of germs on your skin by washing with CHG (chlorahexidine gluconate) soap before surgery.  CHG is an antiseptic cleaner which kills germs and bonds with the skin to continue killing germs even after washing. Please DO NOT use if you have an allergy to CHG or antibacterial soaps.  If your skin becomes reddened/irritated stop using the CHG and inform your nurse when you arrive at Short Stay. Do not shave (including legs and underarms) for at least 48 hours prior to the first CHG shower.  You may shave your face/neck. Please follow these instructions carefully:  1.  Shower with CHG Soap the night before surgery and the  morning of Surgery.  2.  If you choose to wash your hair, wash your hair first as usual with your  normal  shampoo.  3.  After you shampoo, rinse your hair and body thoroughly to remove the  shampoo.                           4.  Use CHG as you would any other liquid soap.  You can apply chg directly  to the skin and wash                       Gently with a scrungie or clean washcloth.  5.  Apply the CHG Soap to your body ONLY FROM THE NECK DOWN.   Do not use on face/ open                           Wound or open sores. Avoid contact with eyes, ears mouth and genitals (private parts).                       Wash face,  Genitals (private parts) with your normal soap.             6.  Wash thoroughly, paying special attention to the area where your surgery  will be performed.  7.  Thoroughly rinse your body with warm water from the neck down.  8.  DO NOT shower/wash with your normal soap after using and rinsing off  the CHG Soap.                9.  Pat yourself dry with a clean towel.            10.  Wear clean pajamas.            11.  Place clean sheets on your bed the night of your first shower and do not  sleep with pets. Day of Surgery : Do not apply any lotions/deodorants the morning of surgery.  Please wear clean  clothes to the hospital/surgery center.  FAILURE TO FOLLOW THESE INSTRUCTIONS MAY RESULT IN THE CANCELLATION OF YOUR SURGERY PATIENT SIGNATURE_________________________________  NURSE SIGNATURE__________________________________  ________________________________________________________________________

## 2023-11-26 DIAGNOSIS — H6502 Acute serous otitis media, left ear: Secondary | ICD-10-CM | POA: Diagnosis not present

## 2023-11-30 ENCOUNTER — Encounter (HOSPITAL_COMMUNITY)
Admission: RE | Admit: 2023-11-30 | Discharge: 2023-11-30 | Disposition: A | Discharge status: Home / Self Care | Source: Ambulatory Visit | Attending: Urology | Admitting: Urology

## 2023-11-30 ENCOUNTER — Encounter (HOSPITAL_COMMUNITY): Payer: Self-pay

## 2023-11-30 ENCOUNTER — Other Ambulatory Visit: Payer: Self-pay

## 2023-11-30 VITALS — BP 123/65 | HR 73 | Temp 98.0°F | Resp 16 | Ht 68.0 in | Wt 175.0 lb

## 2023-11-30 DIAGNOSIS — G4733 Obstructive sleep apnea (adult) (pediatric): Secondary | ICD-10-CM | POA: Insufficient documentation

## 2023-11-30 DIAGNOSIS — Z951 Presence of aortocoronary bypass graft: Secondary | ICD-10-CM | POA: Diagnosis not present

## 2023-11-30 DIAGNOSIS — N1330 Unspecified hydronephrosis: Secondary | ICD-10-CM | POA: Insufficient documentation

## 2023-11-30 DIAGNOSIS — I2510 Atherosclerotic heart disease of native coronary artery without angina pectoris: Secondary | ICD-10-CM | POA: Insufficient documentation

## 2023-11-30 DIAGNOSIS — Z01812 Encounter for preprocedural laboratory examination: Secondary | ICD-10-CM | POA: Diagnosis not present

## 2023-11-30 DIAGNOSIS — E039 Hypothyroidism, unspecified: Secondary | ICD-10-CM | POA: Insufficient documentation

## 2023-11-30 DIAGNOSIS — R338 Other retention of urine: Secondary | ICD-10-CM | POA: Insufficient documentation

## 2023-11-30 DIAGNOSIS — I255 Ischemic cardiomyopathy: Secondary | ICD-10-CM | POA: Diagnosis not present

## 2023-11-30 DIAGNOSIS — Z01818 Encounter for other preprocedural examination: Secondary | ICD-10-CM

## 2023-11-30 DIAGNOSIS — E119 Type 2 diabetes mellitus without complications: Secondary | ICD-10-CM | POA: Diagnosis not present

## 2023-11-30 DIAGNOSIS — N401 Enlarged prostate with lower urinary tract symptoms: Secondary | ICD-10-CM | POA: Diagnosis not present

## 2023-11-30 LAB — GLUCOSE, CAPILLARY: Glucose-Capillary: 75 mg/dL (ref 70–99)

## 2023-11-30 LAB — BASIC METABOLIC PANEL WITH GFR
Anion gap: 10 (ref 5–15)
BUN: 41 mg/dL — ABNORMAL HIGH (ref 8–23)
CO2: 20 mmol/L — ABNORMAL LOW (ref 22–32)
Calcium: 9.1 mg/dL (ref 8.9–10.3)
Chloride: 106 mmol/L (ref 98–111)
Creatinine, Ser: 2.27 mg/dL — ABNORMAL HIGH (ref 0.61–1.24)
GFR, Estimated: 30 mL/min — ABNORMAL LOW (ref >60)
Glucose, Bld: 92 mg/dL (ref 70–99)
Potassium: 4.5 mmol/L (ref 3.5–5.1)
Sodium: 136 mmol/L (ref 135–145)

## 2023-11-30 LAB — CBC
HCT: 44.1 % (ref 39.0–52.0)
Hemoglobin: 13.5 g/dL (ref 13.0–17.0)
MCH: 20.7 pg — ABNORMAL LOW (ref 26.0–34.0)
MCHC: 30.6 g/dL (ref 30.0–36.0)
MCV: 67.6 fL — ABNORMAL LOW (ref 80.0–100.0)
Platelets: 202 10*3/uL (ref 150–400)
RBC: 6.52 MIL/uL — ABNORMAL HIGH (ref 4.22–5.81)
RDW: 19 % — ABNORMAL HIGH (ref 11.5–15.5)
WBC: 7.5 10*3/uL (ref 4.0–10.5)
nRBC: 0 % (ref 0.0–0.2)

## 2023-11-30 LAB — HEMOGLOBIN A1C
Hgb A1c MFr Bld: 6.5 % — ABNORMAL HIGH (ref 4.8–5.6)
Mean Plasma Glucose: 139.85 mg/dL

## 2023-12-01 NOTE — Progress Notes (Signed)
 Anesthesia Chart Review   Case: [id number] Date/Time: 12/08/23 0900   Procedure: ABLATION, PROSTATE, TRANSURETHRAL, USING WATERJET   Anesthesia type: General   Pre-op diagnosis: URINARY RETENTION, BENIGN PROSTATIC HYPERPLASIA, BILATERAL HYDRONEPHROSIS   Location: [facility] ROOM 03 / [facility]   Surgeons: Jerilee Field, MD       DISCUSSION:73 y.o. smoker with h/o OSA, hypothyroidism, CAD DES 2016 (per notes pt refused CABG, had staged PCI: DES to mid LAD and DES to prox RCA), ischemic cardiomyopathy, DM II, urinary retention, BPH scheduled for above procedure 12/08/2023 with Dr. Jerilee Field.   Pt seen by cardiology 10/23/23 for preoperative evaluation. Per OV note, "The patient is clinically stable with an absence of cardiovascular symptoms at present. He specifically denies symptoms of angina, heart failure, or arrhythmia at present. His LVEF is 35 to 40%. He has had cardiac catheterization within the past year which demonstrated patency of the left main, patency of the LAD and diagonal stents with moderate restenosis at the distal edge of the LAD and diagonal, stable but severe stenosis of a small OM branch and total occlusion of the mid circumflex. The RCA was widely patent with only mild in-stent restenosis. The patient was well compensated with normal LVEDP. Ongoing medical therapy was recommended. He has developed no anginal symptoms in the interim. I think he can proceed with urologic surgery at low risk of cardiovascular complications. No further testing is indicated at this time."  Pt will hold Plavix 5 days prior to procedure.  VS: BP 123/65   Pulse 73   Temp 36.7 C (Oral)   Resp 16   Ht 5\' 8"  (1.727 m)   Wt 79.4 kg   SpO2 100%   BMI 26.61 kg/m   PROVIDERS: Loyola Mast, MD is PCP   Tonny Bollman, MD is Cardiologist   LABS: Labs reviewed: Acceptable for surgery. (all labs ordered are listed, but only abnormal results are displayed)  Labs Reviewed  CBC - Abnormal; Notable  for the following components:      Result Value   RBC 6.52 (*)    MCV 67.6 (*)    MCH 20.7 (*)    RDW 19.0 (*)    All other components within normal limits  BASIC METABOLIC PANEL WITH GFR - Abnormal; Notable for the following components:   CO2 20 (*)    BUN 41 (*)    Creatinine, Ser 2.27 (*)    GFR, Estimated 30 (*)    All other components within normal limits  HEMOGLOBIN A1C - Abnormal; Notable for the following components:   Hgb A1c MFr Bld 6.5 (*)    All other components within normal limits  GLUCOSE, CAPILLARY     IMAGES:   EKG:   CV: Cardiac Cath 12/09/2022 1.  Continued patency of the left main 2.  Continued patency of the LAD and diagonal stents with mild diffuse in-stent restenosis in both vessels, moderate distal edge restenosis in the LAD and the diagonal 3.  Stable but severe stenosis of a small first OM branch and total occlusion of the mid left circumflex which is now collateralized via left to right collaterals filling the distal OM 4.  Continued patency of the RCA stent with mild diffuse in-stent restenosis and mild to moderate distal RCA and proximal PDA stenoses 5.  Normal LVEDP of 12 mmHg, normal wedge pressure of 10 mmHg, then well compensated hemodynamics with normal right-sided diastolic filling pressures and preserved cardiac output of 7.4 L/min with cardiac index of  3.6 L/min/m   Recommendations: Continued medical therapy    Echo 01/24/2021 1. Left ventricular ejection fraction, by estimation, is 35 to 40%. The  left ventricle has moderately decreased function. The left ventricle  demonstrates global hypokinesis. Left ventricular diastolic parameters are  consistent with Grade I diastolic  dysfunction (impaired relaxation). Elevated left ventricular end-diastolic  pressure. There is akinesis of the left ventricular, apical segment. There  is akinesis of the left ventricular, apical septal wall.   2. Right ventricular systolic function is normal. The  right ventricular  size is normal.   3. The mitral valve is normal in structure. Trivial mitral valve  regurgitation. No evidence of mitral stenosis.   4. The aortic valve has an indeterminant number of cusps. There is  moderate calcification of the aortic valve. Aortic valve regurgitation is  not visualized. No aortic stenosis is present.   5. The inferior vena cava is normal in size with greater than 50%  respiratory variability, suggesting right atrial pressure of 3 mmHg.  Past Medical History:  Diagnosis Date   Acute MI, lateral wall, initial episode of care (HCC) 04/18/2015   CAD (coronary artery disease) 04/18/2015   a. Lat STEMI 8/16 - LHC:  pLAD 90, mLAD 50, D1 100, dLCx 90, OM1 80, pRCA 75, EF 35-45% >> DES to D1;  b. staged PCI 04/20/15 - DES to mid LAD and DES to prox RCA   Depression    DM2 (diabetes mellitus, type 2) (HCC)    Hypothyroidism    Ischemic cardiomyopathy    a. Echo 8/16:  Mild LVH, EF 40-45%, mid-apical anterolateral and apical AK, grade 1 diastolic dysfunction, trivial effusion   OSA (obstructive sleep apnea) 09/21/2018   ST elevation myocardial infarction (STEMI) of lateral wall (HCC) 04/18/2015    Past Surgical History:  Procedure Laterality Date   BIOPSY  07/18/2023   Procedure: BIOPSY;  Surgeon: Jenel Lucks, MD;  Location: [facility] ENDOSCOPY;  Service: Gastroenterology;;   CARDIAC CATHETERIZATION N/A 04/18/2015   Procedure: Left Heart Cath and Coronary Angiography;  Surgeon: Tonny Bollman, MD;  Location: [facility] INVASIVE CV LAB;  Service: Cardiovascular;  Laterality: N/A;   CARDIAC CATHETERIZATION N/A 04/18/2015   Procedure: Coronary Stent Intervention;  Surgeon: Tonny Bollman, MD;  Location: [facility] INVASIVE CV LAB;  Service: Cardiovascular;  Laterality: N/A;   CARDIAC CATHETERIZATION N/A 04/20/2015   Procedure: Coronary Stent Intervention;  Surgeon: Kathleene Hazel, MD;  Location: [facility] INVASIVE CV LAB;  Service: Cardiovascular;  Laterality: N/A;    CORONARY STENT PLACEMENT     ESOPHAGOGASTRODUODENOSCOPY N/A 07/18/2023   Procedure: ESOPHAGOGASTRODUODENOSCOPY (EGD);  Surgeon: Jenel Lucks, MD;  Location[facility] ENDOSCOPY;  Service: Gastroenterology;  Laterality: N/A;   RIGHT/LEFT HEART CATH AND CORONARY ANGIOGRAPHY N/A 12/09/2022   Procedure: RIGHT/LEFT HEART CATH AND CORONARY ANGIOGRAPHY;  Surgeon: Tonny Bollman, MD;  Location: [facility] INVASIVE CV LAB;  Service: Cardiovascular;  Laterality: N/A;    MEDICATIONS:  clopidogrel (PLAVIX) 75 MG tablet   cyanocobalamin (VITAMIN B12) 1000 MCG tablet   levothyroxine (SYNTHROID) 150 MCG tablet   Misc Natural Products (PROSTATE HEALTH PO)   pyridoxine (B-6) 100 MG tablet   No current facility-administered medications for this encounter.     Jodell Cipro Ward, PA-C [facility] [phone]

## 2023-12-01 NOTE — Anesthesia Preprocedure Evaluation (Addendum)
 Anesthesia Evaluation  Patient identified by MRN, date of birth, ID band Patient awake    Reviewed: Allergy & Precautions, NPO status , Patient's Chart, lab work & pertinent test results  Airway Mallampati: IV  TM Distance: >3 FB Neck ROM: Full    Dental  (+) Teeth Intact, Dental Advisory Given   Pulmonary sleep apnea (pt denies ever having sleep study, denies snoring) , Current Smoker and Patient abstained from smoking. 3-5cigg/d   Pulmonary exam normal breath sounds clear to auscultation       Cardiovascular hypertension (168/69 preop, per pt normally 130 SBP- no home meds per pt), + CAD, + Past MI (2016: per notes pt refused CABG, had staged PCI: DES to mid LAD and DES to prox RCA), + Cardiac Stents (2016, LD plavix 6d ago) and +CHF (LVEF 35-40%)  Normal cardiovascular exam Rhythm:Regular Rate:Normal  Cardiac Cath 12/09/2022 1.  Continued patency of the left main 2.  Continued patency of the LAD and diagonal stents with mild diffuse in-stent restenosis in both vessels, moderate distal edge restenosis in the LAD and the diagonal 3.  Stable but severe stenosis of a small first OM branch and total occlusion of the mid left circumflex which is now collateralized via left to right collaterals filling the distal OM 4.  Continued patency of the RCA stent with mild diffuse in-stent restenosis and mild to moderate distal RCA and proximal PDA stenoses 5.  Normal LVEDP of 12 mmHg, normal wedge pressure of 10 mmHg, then well compensated hemodynamics with normal right-sided diastolic filling pressures and preserved cardiac output of 7.4 L/min with cardiac index of 3.6 L/min/m   Recommendations: Continued medical therapy   Echo 01/24/2021 1. Left ventricular ejection fraction, by estimation, is 35 to 40%. The  left ventricle has moderately decreased function. The left ventricle  demonstrates global hypokinesis. Left ventricular diastolic  parameters are  consistent with Grade I diastolic  dysfunction (impaired relaxation). Elevated left ventricular end-diastolic  pressure. There is akinesis of the left ventricular, apical segment. There  is akinesis of the left ventricular, apical septal wall.   2. Right ventricular systolic function is normal. The right ventricular  size is normal.   3. The mitral valve is normal in structure. Trivial mitral valve  regurgitation. No evidence of mitral stenosis.   4. The aortic valve has an indeterminant number of cusps. There is  moderate calcification of the aortic valve. Aortic valve regurgitation is  not visualized. No aortic stenosis is present.   5. The inferior vena cava is normal in size with greater than 50%  respiratory variability, suggesting right atrial pressure of 3 mmHg.      Neuro/Psych  PSYCHIATRIC DISORDERS  Depression    negative neurological ROS     GI/Hepatic Neg liver ROS, PUD,,,  Endo/Other  diabetesHypothyroidism    Renal/GU Renal Insufficiency and CRFRenal diseaseCr 2.27  negative genitourinary   Musculoskeletal negative musculoskeletal ROS (+)    Abdominal   Peds  Hematology negative hematology ROS (+)   Anesthesia Other Findings   Reproductive/Obstetrics negative OB ROS                              Anesthesia Physical Anesthesia Plan  ASA: 3  Anesthesia Plan: General   Post-op Pain Management: Tylenol PO (pre-op)*   Induction: Intravenous  PONV Risk Score and Plan: 1 and Ondansetron, Dexamethasone and Treatment may vary due to age or medical condition  Airway  Management Planned: Oral ETT  Additional Equipment: ClearSight  Intra-op Plan:   Post-operative Plan: Extubation in OR  Informed Consent: I have reviewed the patients History and Physical, chart, labs and discussed the procedure including the risks, benefits and alternatives for the proposed anesthesia with the patient or authorized representative  who has indicated his/her understanding and acceptance.     Dental advisory given  Plan Discussed with: CRNA  Anesthesia Plan Comments: (Clearsight for closer BP monitoring)       Anesthesia Quick Evaluation

## 2023-12-02 ENCOUNTER — Encounter: Payer: Self-pay | Admitting: Internal Medicine

## 2023-12-06 ENCOUNTER — Other Ambulatory Visit: Payer: Self-pay | Admitting: Cardiovascular Disease

## 2023-12-08 ENCOUNTER — Ambulatory Visit (HOSPITAL_COMMUNITY)
Admission: RE | Admit: 2023-12-08 | Discharge: 2023-12-08 | Disposition: A | Discharge status: Home / Self Care | Payer: 59 | Source: Ambulatory Visit | Attending: Urology | Admitting: Urology

## 2023-12-08 ENCOUNTER — Ambulatory Visit (HOSPITAL_COMMUNITY): Payer: Self-pay | Admitting: Physician Assistant

## 2023-12-08 ENCOUNTER — Ambulatory Visit (HOSPITAL_BASED_OUTPATIENT_CLINIC_OR_DEPARTMENT_OTHER): Admitting: Anesthesiology

## 2023-12-08 ENCOUNTER — Encounter (HOSPITAL_COMMUNITY)
Admission: RE | Disposition: A | Discharge status: Home / Self Care | Payer: Self-pay | Source: Ambulatory Visit | Attending: Urology

## 2023-12-08 ENCOUNTER — Encounter (HOSPITAL_COMMUNITY): Payer: Self-pay | Admitting: Urology

## 2023-12-08 DIAGNOSIS — N401 Enlarged prostate with lower urinary tract symptoms: Secondary | ICD-10-CM

## 2023-12-08 DIAGNOSIS — E1122 Type 2 diabetes mellitus with diabetic chronic kidney disease: Secondary | ICD-10-CM | POA: Insufficient documentation

## 2023-12-08 DIAGNOSIS — N1330 Unspecified hydronephrosis: Secondary | ICD-10-CM

## 2023-12-08 DIAGNOSIS — R3914 Feeling of incomplete bladder emptying: Secondary | ICD-10-CM | POA: Diagnosis not present

## 2023-12-08 DIAGNOSIS — Z7902 Long term (current) use of antithrombotics/antiplatelets: Secondary | ICD-10-CM | POA: Insufficient documentation

## 2023-12-08 DIAGNOSIS — I252 Old myocardial infarction: Secondary | ICD-10-CM | POA: Diagnosis not present

## 2023-12-08 DIAGNOSIS — Z955 Presence of coronary angioplasty implant and graft: Secondary | ICD-10-CM | POA: Insufficient documentation

## 2023-12-08 DIAGNOSIS — N138 Other obstructive and reflux uropathy: Secondary | ICD-10-CM

## 2023-12-08 DIAGNOSIS — F17210 Nicotine dependence, cigarettes, uncomplicated: Secondary | ICD-10-CM | POA: Insufficient documentation

## 2023-12-08 DIAGNOSIS — I255 Ischemic cardiomyopathy: Secondary | ICD-10-CM | POA: Diagnosis not present

## 2023-12-08 DIAGNOSIS — I2510 Atherosclerotic heart disease of native coronary artery without angina pectoris: Secondary | ICD-10-CM | POA: Diagnosis not present

## 2023-12-08 DIAGNOSIS — G4733 Obstructive sleep apnea (adult) (pediatric): Secondary | ICD-10-CM | POA: Insufficient documentation

## 2023-12-08 DIAGNOSIS — R350 Frequency of micturition: Secondary | ICD-10-CM

## 2023-12-08 DIAGNOSIS — I130 Hypertensive heart and chronic kidney disease with heart failure and stage 1 through stage 4 chronic kidney disease, or unspecified chronic kidney disease: Secondary | ICD-10-CM | POA: Diagnosis not present

## 2023-12-08 DIAGNOSIS — N189 Chronic kidney disease, unspecified: Secondary | ICD-10-CM | POA: Diagnosis not present

## 2023-12-08 DIAGNOSIS — Z01818 Encounter for other preprocedural examination: Secondary | ICD-10-CM

## 2023-12-08 DIAGNOSIS — R3912 Poor urinary stream: Secondary | ICD-10-CM | POA: Diagnosis not present

## 2023-12-08 DIAGNOSIS — I129 Hypertensive chronic kidney disease with stage 1 through stage 4 chronic kidney disease, or unspecified chronic kidney disease: Secondary | ICD-10-CM | POA: Insufficient documentation

## 2023-12-08 DIAGNOSIS — N184 Chronic kidney disease, stage 4 (severe): Secondary | ICD-10-CM | POA: Diagnosis not present

## 2023-12-08 DIAGNOSIS — I5022 Chronic systolic (congestive) heart failure: Secondary | ICD-10-CM | POA: Diagnosis not present

## 2023-12-08 LAB — GLUCOSE, CAPILLARY
Glucose-Capillary: 91 mg/dL (ref 70–99)
Glucose-Capillary: 106 mg/dL — ABNORMAL HIGH (ref 70–99)

## 2023-12-08 SURGERY — ABLATION, PROSTATE, TRANSURETHRAL, USING WATERJET
Anesthesia: General

## 2023-12-08 MED ORDER — NITROFURANTOIN MONOHYD MACRO 100 MG PO CAPS
100.0000 mg | ORAL_CAPSULE | Freq: Every day | ORAL | 0 refills | Status: AC
Start: 2023-12-08 — End: 2023-12-23

## 2023-12-08 MED ORDER — LACTATED RINGERS IV SOLN: INTRAVENOUS | Status: DC

## 2023-12-08 MED ORDER — MIDAZOLAM HCL 5 MG/5ML IJ SOLN
INTRAMUSCULAR | Status: DC | PRN
  Administered 2023-12-08: 2 mg via INTRAVENOUS

## 2023-12-08 MED ORDER — 0.9 % SODIUM CHLORIDE (POUR BTL) OPTIME
TOPICAL | Status: DC | PRN
  Administered 2023-12-08: 1000 mL

## 2023-12-08 MED ORDER — CLOPIDOGREL BISULFATE 75 MG PO TABS: ORAL_TABLET | ORAL | Status: AC

## 2023-12-08 MED ORDER — ESMOLOL HCL 100 MG/10ML IV SOLN
INTRAVENOUS | Status: AC
  Filled 2023-12-08: qty 10

## 2023-12-08 MED ORDER — PHENYLEPHRINE 80 MCG/ML (10ML) SYRINGE FOR IV PUSH (FOR BLOOD PRESSURE SUPPORT)
PREFILLED_SYRINGE | INTRAVENOUS | Status: DC | PRN
  Administered 2023-12-08: 160 ug via INTRAVENOUS

## 2023-12-08 MED ORDER — HYDROMORPHONE HCL 1 MG/ML IJ SOLN
INTRAMUSCULAR | Status: AC
  Filled 2023-12-08: qty 1

## 2023-12-08 MED ORDER — OXYCODONE HCL 5 MG/5ML PO SOLN: 5.0000 mg | Freq: Once | ORAL | Status: DC | PRN

## 2023-12-08 MED ORDER — PHENYLEPHRINE HCL (PRESSORS) 10 MG/ML IV SOLN
INTRAVENOUS | Status: AC
  Filled 2023-12-08: qty 1

## 2023-12-08 MED ORDER — ONDANSETRON HCL 4 MG/2ML IJ SOLN
4.0000 mg | Freq: Once | INTRAMUSCULAR | Status: AC | PRN
  Administered 2023-12-08: 4 mg via INTRAVENOUS

## 2023-12-08 MED ORDER — LIDOCAINE 20MG/ML (2%) 15 ML SYRINGE OPTIME
INTRAMUSCULAR | Status: DC | PRN
Start: 2023-12-08 — End: 2023-12-08
  Administered 2023-12-08: 60 mg via INTRAVENOUS

## 2023-12-08 MED ORDER — DEXAMETHASONE SODIUM PHOSPHATE 10 MG/ML IJ SOLN
INTRAMUSCULAR | Status: DC | PRN
  Administered 2023-12-08: 10 mg via INTRAVENOUS

## 2023-12-08 MED ORDER — ROCURONIUM BROMIDE 100 MG/10ML IV SOLN
INTRAVENOUS | Status: DC | PRN
  Administered 2023-12-08: 60 mg via INTRAVENOUS

## 2023-12-08 MED ORDER — FENTANYL CITRATE (PF) 100 MCG/2ML IJ SOLN
INTRAMUSCULAR | Status: AC
  Filled 2023-12-08: qty 2

## 2023-12-08 MED ORDER — ACETAMINOPHEN 500 MG PO TABS
1000.0000 mg | ORAL_TABLET | Freq: Once | ORAL | Status: AC
  Administered 2023-12-08: 1000 mg via ORAL
  Filled 2023-12-08: qty 2

## 2023-12-08 MED ORDER — SODIUM CHLORIDE 0.9 % IV SOLN
2.0000 g | INTRAVENOUS | Status: AC
Start: 2023-12-08 — End: 2023-12-08
  Administered 2023-12-08: 2 g via INTRAVENOUS
  Filled 2023-12-08: qty 20

## 2023-12-08 MED ORDER — PHENYLEPHRINE 80 MCG/ML (10ML) SYRINGE FOR IV PUSH (FOR BLOOD PRESSURE SUPPORT)
PREFILLED_SYRINGE | INTRAVENOUS | Status: AC
  Filled 2023-12-08: qty 10

## 2023-12-08 MED ORDER — SODIUM CHLORIDE 0.9 % IR SOLN
Status: DC | PRN
  Administered 2023-12-08: 9000 mL

## 2023-12-08 MED ORDER — FENTANYL CITRATE (PF) 100 MCG/2ML IJ SOLN
INTRAMUSCULAR | Status: DC | PRN
  Administered 2023-12-08: 100 ug via INTRAVENOUS

## 2023-12-08 MED ORDER — SODIUM CHLORIDE 0.9 % IR SOLN
3000.0000 mL | Status: DC
  Administered 2023-12-08 (×2): 3000 mL

## 2023-12-08 MED ORDER — TRANEXAMIC ACID-NACL 1000-0.7 MG/100ML-% IV SOLN
1000.0000 mg | INTRAVENOUS | Status: AC
  Administered 2023-12-08: 1000 mg via INTRAVENOUS
  Filled 2023-12-08: qty 100

## 2023-12-08 MED ORDER — OXYCODONE HCL 5 MG PO TABS: 5.0000 mg | ORAL_TABLET | Freq: Once | ORAL | Status: DC | PRN

## 2023-12-08 MED ORDER — STERILE WATER FOR IRRIGATION IR SOLN
Status: DC | PRN
  Administered 2023-12-08: 500 mL

## 2023-12-08 MED ORDER — HYDROMORPHONE HCL 1 MG/ML IJ SOLN
0.2500 mg | INTRAMUSCULAR | Status: DC | PRN
  Administered 2023-12-08 (×2): 0.5 mg via INTRAVENOUS

## 2023-12-08 MED ORDER — CHLORHEXIDINE GLUCONATE 0.12 % MT SOLN
15.0000 mL | Freq: Once | OROMUCOSAL | Status: AC
  Administered 2023-12-08: 15 mL via OROMUCOSAL

## 2023-12-08 MED ORDER — PHENYLEPHRINE HCL-NACL 20-0.9 MG/250ML-% IV SOLN
INTRAVENOUS | Status: DC | PRN
  Administered 2023-12-08: 25 ug/min via INTRAVENOUS

## 2023-12-08 MED ORDER — ORAL CARE MOUTH RINSE: 15.0000 mL | Freq: Once | OROMUCOSAL | Status: AC

## 2023-12-08 MED ORDER — MIDAZOLAM HCL 2 MG/2ML IJ SOLN
INTRAMUSCULAR | Status: AC
  Filled 2023-12-08: qty 2

## 2023-12-08 MED ORDER — INSULIN ASPART 100 UNIT/ML IJ SOLN: 0.0000 [IU] | INTRAMUSCULAR | Status: DC | PRN

## 2023-12-08 MED ORDER — SUGAMMADEX SODIUM 200 MG/2ML IV SOLN
INTRAVENOUS | Status: DC | PRN
  Administered 2023-12-08: 160 mg via INTRAVENOUS

## 2023-12-08 MED ORDER — PROPOFOL 10 MG/ML IV BOLUS
INTRAVENOUS | Status: DC | PRN
  Administered 2023-12-08: 150 mg via INTRAVENOUS

## 2023-12-08 SURGICAL SUPPLY — 27 items
BAG URINE DRAIN 2000ML AR STRL (UROLOGICAL SUPPLIES) ×1 IMPLANT
BAND RUBBER #18 3X1/16 STRL (MISCELLANEOUS) IMPLANT
CATH HEMA 3WAY 30CC 22FR COUDE (CATHETERS) IMPLANT
CATH HEMA 3WAY 30CC 24FR COUDE (CATHETERS) IMPLANT
COVER MAYO STAND STRL (DRAPES) ×1 IMPLANT
DRAPE FOOT SWITCH (DRAPES) ×1 IMPLANT
DRAPE SURG IRRIG POUCH 19X23 (DRAPES) IMPLANT
GEL ULTRASOUND 8.5O AQUASONIC (MISCELLANEOUS) ×1 IMPLANT
GLOVE SURG LX STRL 7.5 STRW (GLOVE) ×1 IMPLANT
GOWN STRL REUS W/ TWL XL LVL3 (GOWN DISPOSABLE) ×1 IMPLANT
HANDPIECE AQUABEAM (MISCELLANEOUS) ×1 IMPLANT
HOLDER FOLEY CATH W/STRAP (MISCELLANEOUS) IMPLANT
KIT TURNOVER KIT A (KITS) IMPLANT
LOOP CUT BIPOLAR 24F LRG (ELECTROSURGICAL) IMPLANT
MANIFOLD NEPTUNE II (INSTRUMENTS) ×1 IMPLANT
MAT ABSORB FLUID 56X50 GRAY (MISCELLANEOUS) ×1 IMPLANT
PACK CYSTO (CUSTOM PROCEDURE TRAY) ×1 IMPLANT
PACK DRAPE AQUABEAM (MISCELLANEOUS) ×1 IMPLANT
PAD PREP 24X48 CUFFED NSTRL (MISCELLANEOUS) ×1 IMPLANT
PIN SAFETY STERILE (MISCELLANEOUS) IMPLANT
SYR 30ML LL (SYRINGE) ×1 IMPLANT
SYR TOOMEY IRRIG 70ML (MISCELLANEOUS) ×2 IMPLANT
SYRINGE TOOMEY IRRIG 70ML (MISCELLANEOUS) ×2 IMPLANT
TOWEL OR 17X26 10 PK STRL BLUE (TOWEL DISPOSABLE) ×1 IMPLANT
TUBING CONNECTING 10 (TUBING) ×2 IMPLANT
TUBING UROLOGY SET (TUBING) ×1 IMPLANT
UNDERPAD 30X36 HEAVY ABSORB (UNDERPADS AND DIAPERS) ×1 IMPLANT

## 2023-12-08 NOTE — H&P (Signed)
 H&P  Chief Complaint: BPH, urinary retention  History of Present Illness: Nicolas Ray is a 73 year old male with a history of BPH.  He had incomplete bladder emptying.  He had a TURP done in 2018 with Dr. Vicki Mallet.  Prostate was about 80 to 125 g on imaging.  His PVR was 400 and then increased to 650+.  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  He was prescribed tamsulosin and finasteride but could not tolerate either.  He said it caused him to feel lightheaded and his hands ached.  He said he read the side effects online and would not take medication.  Ultimately developed urinary retention and failed a voiding trial.  Urodynamics did show a 40 to 60 cm water bladder contraction.  He has been well.  He held his Plavix for 6 days.  His urine is clear.  He said no bladder pain or dysuria.  No cough cold or congestion.  No shortness of breath or chest pain.  Past Medical History:  Diagnosis Date   Acute MI, lateral wall, initial episode of care (HCC) 04/18/2015   CAD (coronary artery disease) 04/18/2015   a. Lat STEMI 8/16 - LHC:  pLAD 90, mLAD 50, D1 100, dLCx 90, OM1 80, pRCA 75, EF 35-45% >> DES to D1;  b. staged PCI 04/20/15 - DES to mid LAD and DES to prox RCA   Depression    DM2 (diabetes mellitus, type 2) (HCC)    Hypothyroidism    Ischemic cardiomyopathy    a. Echo 8/16:  Mild LVH, EF 40-45%, mid-apical anterolateral and apical AK, grade 1 diastolic dysfunction, trivial effusion   OSA (obstructive sleep apnea) 09/21/2018   ST elevation myocardial infarction (STEMI) of lateral wall (HCC) 04/18/2015   Past Surgical History:  Procedure Laterality Date   BIOPSY  07/18/2023   Procedure: BIOPSY;  Surgeon: Jenel Lucks, MD;  Location: [facility] ENDOSCOPY;  Service: Gastroenterology;;   CARDIAC CATHETERIZATION N/A 04/18/2015   Procedure: Left Heart Cath and Coronary Angiography;  Surgeon: Tonny Bollman, MD;  Location: [facility]  INVASIVE CV LAB;  Service: Cardiovascular;  Laterality: N/A;   CARDIAC CATHETERIZATION N/A 04/18/2015   Procedure: Coronary Stent Intervention;  Surgeon: Tonny Bollman, MD;  Location: [facility] INVASIVE CV LAB;  Service: Cardiovascular;  Laterality: N/A;   CARDIAC CATHETERIZATION N/A 04/20/2015   Procedure: Coronary Stent Intervention;  Surgeon: Kathleene Hazel, MD;  Location: [facility] INVASIVE CV LAB;  Service: Cardiovascular;  Laterality: N/A;   CORONARY STENT PLACEMENT     ESOPHAGOGASTRODUODENOSCOPY N/A 07/18/2023   Procedure: ESOPHAGOGASTRODUODENOSCOPY (EGD);  Surgeon: Jenel Lucks, MD;  Location: [facility] ENDOSCOPY;  Service: Gastroenterology;  Laterality: N/A;   RIGHT/LEFT HEART CATH AND CORONARY ANGIOGRAPHY N/A 12/09/2022   Procedure: RIGHT/LEFT HEART CATH AND CORONARY ANGIOGRAPHY;  Surgeon: Tonny Bollman, MD;  Location: [facility] INVASIVE CV LAB;  Service: Cardiovascular;  Laterality: N/A;    Home Medications:  Medications Prior to Admission  Medication Sig Dispense Refill Last Dose/Taking   clopidogrel (PLAVIX) 75 MG tablet TAKE 1 TABLET(75 MG) BY MOUTH DAILY 90 tablet 3 Past Week   cyanocobalamin (VITAMIN B12) 1000 MCG tablet Take 1,000 mcg by mouth daily.   Past Week   levothyroxine (SYNTHROID) 150 MCG tablet Take 1 tablet (150 mcg total) by mouth daily before breakfast. 90 tablet 3 12/07/2023   Misc Natural Products (PROSTATE HEALTH PO) Take 1 capsule by mouth daily.   Past Week   pyridoxine (B-6) 100  MG tablet Take 100 mg by mouth daily.   Past Week   Allergies:  Allergies  Allergen Reactions   Aluminum-Containing Compounds Other (See Comments)    Makes hands feel weak     Family History  Problem Relation Age of Onset   Heart disease Mother    Cancer Father        Neck   Thyroid disease Sister    Stroke Brother    Colon cancer Neg Hx    Diabetes Neg Hx    Liver cancer Neg Hx    Esophageal cancer Neg Hx    Social History:  reports that he has been smoking cigarettes. He has  never used smokeless tobacco. He reports that he does not drink alcohol and does not use drugs.  ROS: A complete review of systems was performed.  All systems are negative except for pertinent findings as noted. Review of Systems  All other systems reviewed and are negative.    Physical Exam:  Vital signs in last 24 hours: Temp:  [98.1 F (36.7 C)] 98.1 F (36.7 C) (04/08 0731) Pulse Rate:  [77] 77 (04/08 0731) Resp:  [16] 16 (04/08 0731) BP: (168)/(69) 168/69 (04/08 0731) SpO2:  [96 %] 96 % (04/08 0731) General:  Alert and oriented, No acute distress HEENT: Normocephalic, atraumatic Cardiovascular: Regular rate and rhythm Lungs: Regular rate and effort Abdomen: Soft, nontender, nondistended, no abdominal masses Back: No CVA tenderness Extremities: No edema Neurologic: Grossly intact  Laboratory Data:  Results for orders placed or performed during the hospital encounter of 12/08/23 (from the past 24 hours)  Glucose, capillary     Status: None   Collection Time: 12/08/23  8:02 AM  Result Value Ref Range   Glucose-Capillary 91 70 - 99 mg/dL   No results found for this or any previous visit (from the past 240 hours). Creatinine: No results for input(s): "CREATININE" in the last 168 hours.  Impression/Assessment:  BPH, urinary retention -   Plan:  I discussed with the patient the nature, potential benefits, risks and alternatives to robotic water jet ablation of the prostate, including side effects of the proposed treatment, the likelihood of the patient achieving the goals of the procedure, and any potential problems that might occur during the procedure or recuperation.  We also discussed preserving some tissue around the verumontanum versus maximizing success with voiding and he would like to preserve some tissue for ejaculation.  Also discussed he may fail and continue to need a catheter because of decreased bladder function among other risks.  Also discussed expectations  for retreatment rates.  All questions answered. Patient elects to proceed.   Jerilee Field 12/08/2023, 9:26 AM

## 2023-12-08 NOTE — Anesthesia Postprocedure Evaluation (Signed)
 Anesthesia Post Note  Patient: Nicolas Ray  Procedure(s) Performed: TRANSURETHRAL ABLATION OF THE PROSTATE USING WATERJET, TRANSURETHERAL RESECTION OF THE PROSTATE     Patient location during evaluation: PACU Anesthesia Type: General Level of consciousness: awake and alert, oriented and patient cooperative Pain management: pain level controlled Vital Signs Assessment: post-procedure vital signs reviewed and stable Respiratory status: spontaneous breathing, nonlabored ventilation and respiratory function stable Cardiovascular status: blood pressure returned to baseline and stable Postop Assessment: no apparent nausea or vomiting Anesthetic complications: no   No notable events documented.  Last Vitals:  Vitals:   12/08/23 1138 12/08/23 1145  BP:  (!) 148/83  Pulse:  66  Resp:  (!) 9  Temp:    SpO2: 90% 95%    Last Pain:  Vitals:   12/08/23 1145  TempSrc:   PainSc: 5                  Lannie Fields

## 2023-12-08 NOTE — Op Note (Signed)
 Preoperative diagnosis: BPH with lower urinary tract symptoms, weak stream, frequency  Postoperative diagnosis: Same   Procedure: Robotic water jet ablation of the prostate   Surgeon: Mena Goes   Anesthesia: General   Indication for procedure:   Findings:  On exam the penis was circumcised without mass or lesion.  The glans and meatus appeared normal.  The scrotum was normal.  On rectal exam the prostate was about 50 g with the right lobe slightly larger than the left or open no specific hard area or nodule.  Cystoscopy revealed lateral lobe obstruction, bladder was normal without lesion. No median lobe. Trigone ureteral orifices were visualized post ablation and resection and noted to be normal.  Description of procedure:  He was brought to the operating room and placed supine on the operating table.  After adequate anesthesia he was placed lithotomy position. Timeout was performed to confirm the patient and procedure. The TRUS Stepper was mounted to the Articulating Arm and secured to OR bed. The ultrasound probe was attached to the stepper. Exam under anesthesia was performed and the TRUS was inserted per rectum.  There was no resistance. The ultrasound probe was aligned, and confirmation made that the prostate is centered and aligned using both transverse and sagittal views. The bladder neck, verumontanum and the central/transition zones were identified.  Genitalia were prepped and draped in the usual sterile fashion. The 60F AQUABEAM Handpiece is inserted into the prostatic urethra and a complete cystoscopic evaluation was performed by inspecting the prostate, bladder, and identifying the location of the verumontanum/external sphincter. The AQUABEAM Handpiece was secured to the Handpiece Articulating Arm. Confirmed alignment of AQUABEAM Handpiece and TRUS Probe to be parallel and colinear. Confirmation that AQUABEAM nozzle is centered and anterior of the bladder neck. The cystoscope was then  retracted to visualize the verumontanum and external sphincter and the cystoscope tip was positioned just proximal to the external sphincter. Reconfirmed alignment of the TRUS probe with the AQUABEAM Handpiece and compression applied with TRUS probe. Horizontal alignment of the Handpiece waterjet nozzle was performed. The Aquablation treatment zones were planned utilizing real-time TRUS to visualize the contour of the prostate and the depth and radial angles of resection were defined in the transverse view. In the sagittal view, the AQUABEAM nozzle is identified and position registered with software. The treatment contours were then adjusted to conform to the intended resection margins. The bladder neck and verumontanum were marked and confirmed in the treatment contour. The Aquablation Treatment was then started following the resection contour confirmed under ultrasound guidance. TOTAL AQUABLATION RESECTION TIME:  7:45  Once Aquablation resection was complete the 24 French aqua beam handpiece was carefully removed.  The continuous-flow sheath with the visual obturator was passed and then the loop and handle.  The trigone and the ureteral orifices were identified.  Resection of some of the residual bladder neck tissue was done except for 6 o'clock to 3 o'clock up the pt's left side. The bladder neck was clear here from prior TURP and not scathed by even the Aquajet. The bladder neck was identified at 6:00 and this was taken up to 12:00 on the right with fulguration of the bladder neck and prostate for hemostasis.  Slight amount of lateral to anterior residual tissue was resected.  Similarly from 6:00 up to 12:00 on the left side of the bladder neck was identified by resecting some of the ablated tissue to identify the bladder neck and cauterize any bleeding.  Some anterior tissue on the left  was resected as well.  This created excellent hemostasis.  All the chips were evacuated.  Ureteral orifices again identified  and noted to be normal without injury.  The scope was backed out and a 22 [place] hematuria catheter was placed with 30 cc in the balloon.  The balloon was seated at the bladder neck and it was irrigated on light traction and noted to be clear to pink.  He was hooked up to CBI.  He was cleaned up and placed supine.  Catheter was placed on traction.  He was awakened and taken to the cover room in stable condition.  Complications: None  Blood loss: 100 mL  Specimens: None  Drains: 22 French three-way hematuria catheter with 30 cc in the balloon  Disposition: Patient stable to PACU

## 2023-12-08 NOTE — Anesthesia Procedure Notes (Signed)
 Procedure Name: Intubation Date/Time: 12/08/2023 9:38 AM  Performed by: Donna Bernard, CRNAPre-anesthesia Checklist: Patient identified, Emergency Drugs available, Suction available, Patient being monitored and Timeout performed Patient Re-evaluated:Patient Re-evaluated prior to induction Oxygen Delivery Method: Circle system utilized Preoxygenation: Pre-oxygenation with 100% oxygen Induction Type: IV induction Ventilation: Mask ventilation without difficulty Laryngoscope Size: 3, Glidescope and Mac Grade View: Grade I Tube type: Oral Tube size: 7.5 mm Number of attempts: 1 Airway Equipment and Method: Stylet Placement Confirmation: positive ETCO2, ETT inserted through vocal cords under direct vision, CO2 detector and breath sounds checked- equal and bilateral Secured at: 22 cm Tube secured with: Tape Dental Injury: Teeth and Oropharynx as per pre-operative assessment

## 2023-12-08 NOTE — Discharge Instructions (Signed)
Robotic water jet ablation of the Prostate, Care After The following information offers guidance on how to care for yourself after your procedure. Your health care provider may also give you more specific instructions. If you have problems or questions, contact your health care provider. What can I expect after the procedure? After the procedure, it is common to have: Mild pain in your lower abdomen. Soreness or mild discomfort in your penis or when you urinate. This is from having the catheter inserted during the procedure. A sudden urge to urinate (urgency). A need to urinate often. A small amount of blood in your urine. You may notice some small blood clots in your urine. These are normal. Follow these instructions at home: Medicines Take over-the-counter and prescription medicines only as told by your health care provider. If you were prescribed an antibiotic medicine, take it as told by your health care provider. Do not stop taking the antibiotic even if you start to feel better. Activity  Rest as told by your health care provider. Avoid sitting for a long time without moving. Get up to take short walks every 1-2 hours. This is important to improve blood flow and breathing. Ask for help if you feel weak or unsteady. You may increase your physical activity gradually as you start to feel better. Do not drive or operate machinery until your health care provider says that it is safe. Do not ride in a car for long periods of time, or as told by your health care provider. Avoid intense physical activity for as long as told by your health care provider. Do not lift anything that is heavier than 10 lb (4.5 kg), or the limit that you are told, until your health care provider says that it is safe. Do not have sex until your health care provider approves. Return to your normal activities as told by your health care provider. Ask your health care provider what activities are safe for you. Preventing  constipation  You may need to take these actions to prevent or treat constipation: Drink enough fluid to keep your urine pale yellow. Take over-the-counter or prescription medicines. Eat foods that are high in fiber, such as beans, whole grains, and fresh fruits and vegetables. Limit foods that are high in fat and processed sugars, such as fried or sweet foods.   General instructions Do not strain when you have a bowel movement. Straining may lead to bleeding from the prostate. This may cause blood clots and trouble urinating. Do not use any products that contain nicotine or tobacco. These products include cigarettes, chewing tobacco, and vaping devices, such as e-cigarettes. If you need help quitting, ask your health care provider. If you go home with a tube draining your urine (urinary catheter), care for the catheter as told by your health care provider. Wear compression stockings as told by your health care provider. These stockings help to prevent blood clots and reduce swelling in your legs. Keep all follow-up visits. This is important. Contact a health care provider if: You have signs of infection, such as: Fever or chills. Urine that smells very bad. Swelling around your urethra that is getting worse. Swelling in your penis or testicles. You have difficulty urinating. You have pain that gets worse or does not improve with medicine. You have blood in your urine that does not go away after 1 week of resting and drinking more fluids. You have trouble having a bowel movement. You have trouble having or keeping an erection. No  semen comes out during orgasm (dry ejaculation). You have a urinary catheter in place, and you have: Spasms or pain. Problems with your catheter or your catheter is blocked. Get help right away if: You are unable to urinate. You are having more blood clots in your urine instead of fewer. You have: Large blood clots. A lot of blood in your urine. Pain in  your back or lower abdomen. You have difficulty breathing or shortness of breath. You develop swelling or pain in your leg. These symptoms may be an emergency. Get help right away. Call 911. Do not wait to see if the symptoms will go away. Do not drive yourself to the hospital. Summary After the procedure, it is common to have a small amount of blood in your urine. Follow restrictions about lifting and sexual activity as told by your health care provider. Ask what activities are safe for you. Keep all follow-up visits. This is important. This information is not intended to replace advice given to you by your health care provider. Make sure you discuss any questions you have with your health care provider. Document Revised: 05/14/2021 Document Reviewed: 05/14/2021 Elsevier Patient Education  2024 [employer].

## 2023-12-08 NOTE — Transfer of Care (Addendum)
 Immediate Anesthesia Transfer of Care Note  Patient: Nicolas Ray  Procedure(s) Performed: TRANSURETHRAL ABLATION OF THE PROSTATE USING WATERJET, TRANSURETHERAL RESECTION OF THE PROSTATE  Patient Location: PACU  Anesthesia Type:General  Level of Consciousness: drowsy and patient cooperative  Airway & Oxygen Therapy: Patient Spontanous Breathing and Patient connected to face mask oxygen  Post-op Assessment: Report given to RN and Post -op Vital signs reviewed and stable  Post vital signs: Reviewed and stable  Last Vitals:  Vitals Value Taken Time  BP 185/104 12/08/23 1100  Temp    Pulse 80 12/08/23 1101  Resp 14 12/08/23 1101  SpO2 98 % 12/08/23 1101  Vitals shown include unfiled device data.  Last Pain:  Vitals:   12/08/23 0731  TempSrc: Oral  PainSc: 0-No pain         Complications: No notable events documented.

## 2023-12-09 DIAGNOSIS — R338 Other retention of urine: Secondary | ICD-10-CM | POA: Diagnosis not present

## 2023-12-09 DIAGNOSIS — R3914 Feeling of incomplete bladder emptying: Secondary | ICD-10-CM | POA: Diagnosis not present

## 2023-12-09 LAB — HEMOGLOBIN A1C
Hgb A1c MFr Bld: 6.4 % — ABNORMAL HIGH (ref 4.8–5.6)
Mean Plasma Glucose: 137 mg/dL

## 2023-12-11 DIAGNOSIS — R300 Dysuria: Secondary | ICD-10-CM | POA: Diagnosis not present

## 2023-12-11 DIAGNOSIS — R338 Other retention of urine: Secondary | ICD-10-CM | POA: Diagnosis not present

## 2023-12-11 LAB — SURGICAL PATHOLOGY

## 2023-12-21 DIAGNOSIS — R310 Gross hematuria: Secondary | ICD-10-CM | POA: Diagnosis not present

## 2023-12-21 DIAGNOSIS — R3914 Feeling of incomplete bladder emptying: Secondary | ICD-10-CM | POA: Diagnosis not present

## 2024-01-29 DIAGNOSIS — E1165 Type 2 diabetes mellitus with hyperglycemia: Secondary | ICD-10-CM | POA: Diagnosis not present

## 2024-01-29 DIAGNOSIS — I2510 Atherosclerotic heart disease of native coronary artery without angina pectoris: Secondary | ICD-10-CM | POA: Diagnosis not present

## 2024-01-29 DIAGNOSIS — E559 Vitamin D deficiency, unspecified: Secondary | ICD-10-CM | POA: Diagnosis not present

## 2024-01-29 DIAGNOSIS — I10 Essential (primary) hypertension: Secondary | ICD-10-CM | POA: Diagnosis not present

## 2024-01-29 DIAGNOSIS — E785 Hyperlipidemia, unspecified: Secondary | ICD-10-CM | POA: Diagnosis not present

## 2024-01-29 DIAGNOSIS — E039 Hypothyroidism, unspecified: Secondary | ICD-10-CM | POA: Diagnosis not present

## 2024-02-03 DIAGNOSIS — I10 Essential (primary) hypertension: Secondary | ICD-10-CM | POA: Diagnosis not present

## 2024-02-03 DIAGNOSIS — N1832 Chronic kidney disease, stage 3b: Secondary | ICD-10-CM | POA: Diagnosis not present

## 2024-02-03 DIAGNOSIS — E1165 Type 2 diabetes mellitus with hyperglycemia: Secondary | ICD-10-CM | POA: Diagnosis not present

## 2024-02-10 DIAGNOSIS — N139 Obstructive and reflux uropathy, unspecified: Secondary | ICD-10-CM | POA: Diagnosis not present

## 2024-02-10 DIAGNOSIS — R3914 Feeling of incomplete bladder emptying: Secondary | ICD-10-CM | POA: Diagnosis not present

## 2024-04-15 DIAGNOSIS — N1832 Chronic kidney disease, stage 3b: Secondary | ICD-10-CM | POA: Diagnosis not present

## 2024-04-15 DIAGNOSIS — R600 Localized edema: Secondary | ICD-10-CM | POA: Diagnosis not present

## 2024-05-03 ENCOUNTER — Other Ambulatory Visit: Payer: Self-pay | Admitting: Family Medicine

## 2024-05-03 DIAGNOSIS — E038 Other specified hypothyroidism: Secondary | ICD-10-CM

## 2024-05-16 DIAGNOSIS — Z87898 Personal history of other specified conditions: Secondary | ICD-10-CM | POA: Diagnosis not present

## 2024-06-20 DIAGNOSIS — I10 Essential (primary) hypertension: Secondary | ICD-10-CM | POA: Diagnosis not present

## 2024-06-20 DIAGNOSIS — I5020 Unspecified systolic (congestive) heart failure: Secondary | ICD-10-CM | POA: Diagnosis not present

## 2024-06-20 DIAGNOSIS — Z0000 Encounter for general adult medical examination without abnormal findings: Secondary | ICD-10-CM | POA: Diagnosis not present

## 2024-06-21 NOTE — Progress Notes (Signed)
 Nicolas Ray                                          MRN: [id number]   06/21/2024   The [facility] Quality Team Specialist reviewed this patient medical record for the purposes of chart review for care gap closure. The following were reviewed: chart review for care gap closure-colorectal cancer screening and kidney health evaluation for diabetes:eGFR  and uACR.    [facility] Quality Team

## 2024-07-08 ENCOUNTER — Inpatient Hospital Stay (HOSPITAL_COMMUNITY)
Admission: EM | Admit: 2024-07-08 | Discharge: 2024-07-15 | DRG: 308 | Disposition: A | Discharge status: Hospice | Attending: Internal Medicine | Admitting: Internal Medicine

## 2024-07-08 ENCOUNTER — Encounter (HOSPITAL_COMMUNITY): Payer: Self-pay

## 2024-07-08 ENCOUNTER — Inpatient Hospital Stay (HOSPITAL_COMMUNITY)

## 2024-07-08 ENCOUNTER — Emergency Department (HOSPITAL_COMMUNITY)

## 2024-07-08 ENCOUNTER — Other Ambulatory Visit: Payer: Self-pay

## 2024-07-08 DIAGNOSIS — I469 Cardiac arrest, cause unspecified: Secondary | ICD-10-CM | POA: Diagnosis not present

## 2024-07-08 DIAGNOSIS — I2510 Atherosclerotic heart disease of native coronary artery without angina pectoris: Secondary | ICD-10-CM | POA: Diagnosis present

## 2024-07-08 DIAGNOSIS — I10 Essential (primary) hypertension: Secondary | ICD-10-CM

## 2024-07-08 DIAGNOSIS — G4733 Obstructive sleep apnea (adult) (pediatric): Secondary | ICD-10-CM | POA: Diagnosis present

## 2024-07-08 DIAGNOSIS — I5023 Acute on chronic systolic (congestive) heart failure: Secondary | ICD-10-CM | POA: Diagnosis present

## 2024-07-08 DIAGNOSIS — I4510 Unspecified right bundle-branch block: Secondary | ICD-10-CM | POA: Diagnosis present

## 2024-07-08 DIAGNOSIS — R570 Cardiogenic shock: Secondary | ICD-10-CM | POA: Diagnosis present

## 2024-07-08 DIAGNOSIS — I5041 Acute combined systolic (congestive) and diastolic (congestive) heart failure: Secondary | ICD-10-CM | POA: Diagnosis not present

## 2024-07-08 DIAGNOSIS — J9601 Acute respiratory failure with hypoxia: Secondary | ICD-10-CM | POA: Diagnosis present

## 2024-07-08 DIAGNOSIS — Z808 Family history of malignant neoplasm of other organs or systems: Secondary | ICD-10-CM

## 2024-07-08 DIAGNOSIS — R54 Age-related physical debility: Secondary | ICD-10-CM | POA: Diagnosis present

## 2024-07-08 DIAGNOSIS — Z9911 Dependence on respirator [ventilator] status: Secondary | ICD-10-CM

## 2024-07-08 DIAGNOSIS — Z781 Physical restraint status: Secondary | ICD-10-CM | POA: Diagnosis not present

## 2024-07-08 DIAGNOSIS — I130 Hypertensive heart and chronic kidney disease with heart failure and stage 1 through stage 4 chronic kidney disease, or unspecified chronic kidney disease: Secondary | ICD-10-CM | POA: Diagnosis present

## 2024-07-08 DIAGNOSIS — Z7902 Long term (current) use of antithrombotics/antiplatelets: Secondary | ICD-10-CM | POA: Diagnosis not present

## 2024-07-08 DIAGNOSIS — N189 Chronic kidney disease, unspecified: Secondary | ICD-10-CM | POA: Diagnosis not present

## 2024-07-08 DIAGNOSIS — Z711 Person with feared health complaint in whom no diagnosis is made: Secondary | ICD-10-CM | POA: Diagnosis not present

## 2024-07-08 DIAGNOSIS — J810 Acute pulmonary edema: Secondary | ICD-10-CM | POA: Diagnosis not present

## 2024-07-08 DIAGNOSIS — Z91148 Patient's other noncompliance with medication regimen for other reason: Secondary | ICD-10-CM

## 2024-07-08 DIAGNOSIS — I2489 Other forms of acute ischemic heart disease: Secondary | ICD-10-CM | POA: Diagnosis not present

## 2024-07-08 DIAGNOSIS — I5021 Acute systolic (congestive) heart failure: Secondary | ICD-10-CM | POA: Diagnosis not present

## 2024-07-08 DIAGNOSIS — E1122 Type 2 diabetes mellitus with diabetic chronic kidney disease: Secondary | ICD-10-CM | POA: Diagnosis present

## 2024-07-08 DIAGNOSIS — N170 Acute kidney failure with tubular necrosis: Secondary | ICD-10-CM | POA: Diagnosis not present

## 2024-07-08 DIAGNOSIS — Z8349 Family history of other endocrine, nutritional and metabolic diseases: Secondary | ICD-10-CM

## 2024-07-08 DIAGNOSIS — H5702 Anisocoria: Secondary | ICD-10-CM | POA: Diagnosis present

## 2024-07-08 DIAGNOSIS — E1165 Type 2 diabetes mellitus with hyperglycemia: Secondary | ICD-10-CM | POA: Diagnosis not present

## 2024-07-08 DIAGNOSIS — I255 Ischemic cardiomyopathy: Secondary | ICD-10-CM | POA: Diagnosis not present

## 2024-07-08 DIAGNOSIS — Z7189 Other specified counseling: Secondary | ICD-10-CM | POA: Diagnosis not present

## 2024-07-08 DIAGNOSIS — Z515 Encounter for palliative care: Secondary | ICD-10-CM

## 2024-07-08 DIAGNOSIS — I4721 Torsades de pointes: Secondary | ICD-10-CM | POA: Diagnosis present

## 2024-07-08 DIAGNOSIS — Z66 Do not resuscitate: Secondary | ICD-10-CM | POA: Diagnosis present

## 2024-07-08 DIAGNOSIS — I462 Cardiac arrest due to underlying cardiac condition: Secondary | ICD-10-CM | POA: Diagnosis present

## 2024-07-08 DIAGNOSIS — Z8241 Family history of sudden cardiac death: Secondary | ICD-10-CM

## 2024-07-08 DIAGNOSIS — Z91048 Other nonmedicinal substance allergy status: Secondary | ICD-10-CM

## 2024-07-08 DIAGNOSIS — N179 Acute kidney failure, unspecified: Secondary | ICD-10-CM

## 2024-07-08 DIAGNOSIS — E872 Acidosis, unspecified: Secondary | ICD-10-CM | POA: Diagnosis present

## 2024-07-08 DIAGNOSIS — G931 Anoxic brain damage, not elsewhere classified: Secondary | ICD-10-CM | POA: Diagnosis not present

## 2024-07-08 DIAGNOSIS — I350 Nonrheumatic aortic (valve) stenosis: Secondary | ICD-10-CM | POA: Diagnosis present

## 2024-07-08 DIAGNOSIS — E785 Hyperlipidemia, unspecified: Secondary | ICD-10-CM | POA: Diagnosis present

## 2024-07-08 DIAGNOSIS — F17210 Nicotine dependence, cigarettes, uncomplicated: Secondary | ICD-10-CM | POA: Diagnosis present

## 2024-07-08 DIAGNOSIS — N184 Chronic kidney disease, stage 4 (severe): Secondary | ICD-10-CM | POA: Diagnosis present

## 2024-07-08 DIAGNOSIS — E039 Hypothyroidism, unspecified: Secondary | ICD-10-CM | POA: Diagnosis present

## 2024-07-08 DIAGNOSIS — I509 Heart failure, unspecified: Principal | ICD-10-CM

## 2024-07-08 DIAGNOSIS — I4901 Ventricular fibrillation: Secondary | ICD-10-CM | POA: Diagnosis present

## 2024-07-08 DIAGNOSIS — Z79890 Hormone replacement therapy (postmenopausal): Secondary | ICD-10-CM

## 2024-07-08 DIAGNOSIS — E861 Hypovolemia: Secondary | ICD-10-CM | POA: Diagnosis not present

## 2024-07-08 DIAGNOSIS — Z823 Family history of stroke: Secondary | ICD-10-CM

## 2024-07-08 DIAGNOSIS — I161 Hypertensive emergency: Secondary | ICD-10-CM | POA: Diagnosis present

## 2024-07-08 DIAGNOSIS — G9340 Encephalopathy, unspecified: Secondary | ICD-10-CM | POA: Diagnosis not present

## 2024-07-08 DIAGNOSIS — Z716 Tobacco abuse counseling: Secondary | ICD-10-CM

## 2024-07-08 DIAGNOSIS — I5020 Unspecified systolic (congestive) heart failure: Secondary | ICD-10-CM | POA: Diagnosis not present

## 2024-07-08 DIAGNOSIS — I252 Old myocardial infarction: Secondary | ICD-10-CM

## 2024-07-08 DIAGNOSIS — Z8249 Family history of ischemic heart disease and other diseases of the circulatory system: Secondary | ICD-10-CM

## 2024-07-08 LAB — CBC WITH DIFFERENTIAL/PLATELET
Abs Immature Granulocytes: 0.05 K/uL (ref 0.00–0.07)
Basophils Absolute: 0.1 K/uL (ref 0.0–0.1)
Basophils Relative: 1 %
Eosinophils Absolute: 0.3 K/uL (ref 0.0–0.5)
Eosinophils Relative: 3 %
HCT: 43.6 % (ref 39.0–52.0)
Hemoglobin: 13.3 g/dL (ref 13.0–17.0)
Immature Granulocytes: 1 %
Lymphocytes Relative: 31 %
Lymphs Abs: 2.8 K/uL (ref 0.7–4.0)
MCH: 21.6 pg — ABNORMAL LOW (ref 26.0–34.0)
MCHC: 30.5 g/dL (ref 30.0–36.0)
MCV: 70.9 fL — ABNORMAL LOW (ref 80.0–100.0)
Monocytes Absolute: 0.8 K/uL (ref 0.1–1.0)
Monocytes Relative: 8 %
Neutro Abs: 5.1 K/uL (ref 1.7–7.7)
Neutrophils Relative %: 56 %
Platelets: 218 K/uL (ref 150–400)
RBC: 6.15 MIL/uL — ABNORMAL HIGH (ref 4.22–5.81)
RDW: 15.4 % (ref 11.5–15.5)
WBC: 9 K/uL (ref 4.0–10.5)
nRBC: 0 % (ref 0.0–0.2)

## 2024-07-08 LAB — I-STAT VENOUS BLOOD GAS, ED
Acid-base deficit: 5 mmol/L — ABNORMAL HIGH (ref 0.0–2.0)
Acid-base deficit: 6 mmol/L — ABNORMAL HIGH (ref 0.0–2.0)
Bicarbonate: 23.2 mmol/L (ref 20.0–28.0)
Bicarbonate: 22.1 mmol/L (ref 20.0–28.0)
Calcium, Ion: 1.16 mmol/L (ref 1.15–1.40)
Calcium, Ion: 1.14 mmol/L — ABNORMAL LOW (ref 1.15–1.40)
HCT: 47 % (ref 39.0–52.0)
HCT: 41 % (ref 39.0–52.0)
Hemoglobin: 16 g/dL (ref 13.0–17.0)
Hemoglobin: 13.9 g/dL (ref 13.0–17.0)
O2 Saturation: 36 %
O2 Saturation: 71 %
Patient temperature: 37
Potassium: 4.4 mmol/L (ref 3.5–5.1)
Potassium: 4.2 mmol/L (ref 3.5–5.1)
Sodium: 138 mmol/L (ref 135–145)
Sodium: 139 mmol/L (ref 135–145)
TCO2: 25 mmol/L (ref 22–32)
TCO2: 24 mmol/L (ref 22–32)
pCO2, Ven: 55.7 mmHg (ref 44–60)
pCO2, Ven: 51.4 mmHg (ref 44–60)
pH, Ven: 7.228 — ABNORMAL LOW (ref 7.25–7.43)
pH, Ven: 7.24 — ABNORMAL LOW (ref 7.25–7.43)
pO2, Ven: 26 mmHg — CL (ref 32–45)
pO2, Ven: 44 mmHg (ref 32–45)

## 2024-07-08 LAB — I-STAT ARTERIAL BLOOD GAS, ED
Acid-base deficit: 7 mmol/L — ABNORMAL HIGH (ref 0.0–2.0)
Bicarbonate: 21.5 mmol/L (ref 20.0–28.0)
Calcium, Ion: 1.23 mmol/L (ref 1.15–1.40)
HCT: 45 % (ref 39.0–52.0)
Hemoglobin: 15.3 g/dL (ref 13.0–17.0)
O2 Saturation: 99 %
Patient temperature: 36.7
Potassium: 3.7 mmol/L (ref 3.5–5.1)
Sodium: 136 mmol/L (ref 135–145)
TCO2: 23 mmol/L (ref 22–32)
pCO2 arterial: 52.1 mmHg — ABNORMAL HIGH (ref 32–48)
pH, Arterial: 7.222 — ABNORMAL LOW (ref 7.35–7.45)
pO2, Arterial: 186 mmHg — ABNORMAL HIGH (ref 83–108)

## 2024-07-08 LAB — BRAIN NATRIURETIC PEPTIDE: B Natriuretic Peptide: 648 pg/mL — ABNORMAL HIGH (ref 0.0–100.0)

## 2024-07-08 LAB — CBC
HCT: 40.2 % (ref 39.0–52.0)
Hemoglobin: 12.6 g/dL — ABNORMAL LOW (ref 13.0–17.0)
MCH: 21.9 pg — ABNORMAL LOW (ref 26.0–34.0)
MCHC: 31.3 g/dL (ref 30.0–36.0)
MCV: 69.9 fL — ABNORMAL LOW (ref 80.0–100.0)
Platelets: 179 K/uL (ref 150–400)
RBC: 5.75 MIL/uL (ref 4.22–5.81)
RDW: 14.8 % (ref 11.5–15.5)
WBC: 9.1 K/uL (ref 4.0–10.5)
nRBC: 0 % (ref 0.0–0.2)

## 2024-07-08 LAB — BASIC METABOLIC PANEL WITH GFR
Anion gap: 13 (ref 5–15)
BUN: 47 mg/dL — ABNORMAL HIGH (ref 8–23)
CO2: 19 mmol/L — ABNORMAL LOW (ref 22–32)
Calcium: 8.4 mg/dL — ABNORMAL LOW (ref 8.9–10.3)
Chloride: 104 mmol/L (ref 98–111)
Creatinine, Ser: 2.71 mg/dL — ABNORMAL HIGH (ref 0.61–1.24)
GFR, Estimated: 24 mL/min — ABNORMAL LOW (ref >60)
Glucose, Bld: 154 mg/dL — ABNORMAL HIGH (ref 70–99)
Potassium: 4.9 mmol/L (ref 3.5–5.1)
Sodium: 136 mmol/L (ref 135–145)

## 2024-07-08 LAB — RESP PANEL BY RT-PCR (RSV, FLU A&B, COVID)  RVPGX2
Influenza A by PCR: NEGATIVE
Influenza B by PCR: NEGATIVE
Resp Syncytial Virus by PCR: NEGATIVE
SARS Coronavirus 2 by RT PCR: NEGATIVE

## 2024-07-08 LAB — HEMOGLOBIN A1C
Hgb A1c MFr Bld: 5.9 % — ABNORMAL HIGH (ref 4.8–5.6)
Mean Plasma Glucose: 122.63 mg/dL

## 2024-07-08 LAB — CREATININE, SERUM
Creatinine, Ser: 2.83 mg/dL — ABNORMAL HIGH (ref 0.61–1.24)
GFR, Estimated: 23 mL/min — ABNORMAL LOW (ref >60)

## 2024-07-08 LAB — CBG MONITORING, ED
Glucose-Capillary: 180 mg/dL — ABNORMAL HIGH (ref 70–99)
Glucose-Capillary: 159 mg/dL — ABNORMAL HIGH (ref 70–99)

## 2024-07-08 LAB — TSH: TSH: 2.112 u[IU]/mL (ref 0.350–4.500)

## 2024-07-08 LAB — TROPONIN I (HIGH SENSITIVITY)
Troponin I (High Sensitivity): 15 ng/L (ref <18)
Troponin I (High Sensitivity): 18 ng/L — ABNORMAL HIGH (ref <18)

## 2024-07-08 MED ORDER — INSULIN ASPART 100 UNIT/ML IJ SOLN
0.0000 [IU] | Freq: Three times a day (TID) | INTRAMUSCULAR | Status: DC
  Administered 2024-07-09: 3 [IU] via SUBCUTANEOUS
  Filled 2024-07-08: qty 2

## 2024-07-08 MED ORDER — DOCUSATE SODIUM 100 MG PO CAPS
100.0000 mg | ORAL_CAPSULE | Freq: Two times a day (BID) | ORAL | Status: DC | PRN
  Administered 2024-07-12: 100 mg via ORAL
  Filled 2024-07-08: qty 1

## 2024-07-08 MED ORDER — NICOTINE 14 MG/24HR TD PT24
14.0000 mg | MEDICATED_PATCH | Freq: Every day | TRANSDERMAL | Status: DC
  Administered 2024-07-09 – 2024-07-15 (×5): 14 mg via TRANSDERMAL
  Filled 2024-07-08 (×6): qty 1

## 2024-07-08 MED ORDER — HEPARIN (PORCINE) 25000 UT/250ML-% IV SOLN
1600.0000 [IU]/h | INTRAVENOUS | Status: DC
  Administered 2024-07-09: 1100 [IU]/h via INTRAVENOUS
  Administered 2024-07-09: 1300 [IU]/h via INTRAVENOUS
  Administered 2024-07-10 – 2024-07-11 (×2): 1600 [IU]/h via INTRAVENOUS
  Filled 2024-07-08 (×4): qty 250

## 2024-07-08 MED ORDER — AMIODARONE HCL IN DEXTROSE 360-4.14 MG/200ML-% IV SOLN
60.0000 mg/h | INTRAVENOUS | Status: AC
  Administered 2024-07-08 – 2024-07-09 (×2): 60 mg/h via INTRAVENOUS
  Filled 2024-07-08: qty 200

## 2024-07-08 MED ORDER — PROPOFOL 1000 MG/100ML IV EMUL
0.0000 ug/kg/min | INTRAVENOUS | Status: DC
  Administered 2024-07-09: 10 ug/kg/min via INTRAVENOUS
  Administered 2024-07-09: 20 ug/kg/min via INTRAVENOUS
  Filled 2024-07-08: qty 100

## 2024-07-08 MED ORDER — NOREPINEPHRINE 4 MG/250ML-% IV SOLN
0.0000 ug/min | INTRAVENOUS | Status: DC
  Administered 2024-07-08: 10 ug/min via INTRAVENOUS
  Administered 2024-07-09: 8 ug/min via INTRAVENOUS
  Filled 2024-07-08: qty 250

## 2024-07-08 MED ORDER — CLOPIDOGREL BISULFATE 75 MG PO TABS
75.0000 mg | ORAL_TABLET | Freq: Every day | ORAL | Status: DC
  Administered 2024-07-08 – 2024-07-09 (×2): 75 mg via ORAL
  Filled 2024-07-08 (×2): qty 1

## 2024-07-08 MED ORDER — AMIODARONE HCL IN DEXTROSE 360-4.14 MG/200ML-% IV SOLN
30.0000 mg/h | INTRAVENOUS | Status: DC
  Administered 2024-07-09 – 2024-07-11 (×5): 30 mg/h via INTRAVENOUS
  Filled 2024-07-08 (×4): qty 200

## 2024-07-08 MED ORDER — POLYETHYLENE GLYCOL 3350 17 G PO PACK
17.0000 g | PACK | Freq: Every day | ORAL | Status: DC | PRN
  Administered 2024-07-11 – 2024-07-12 (×2): 17 g via ORAL

## 2024-07-08 MED ORDER — FUROSEMIDE 10 MG/ML IJ SOLN
80.0000 mg | Freq: Once | INTRAMUSCULAR | Status: AC
  Administered 2024-07-08: 80 mg via INTRAVENOUS
  Filled 2024-07-08: qty 8

## 2024-07-08 MED ORDER — FUROSEMIDE 10 MG/ML IJ SOLN
40.0000 mg | Freq: Two times a day (BID) | INTRAMUSCULAR | Status: DC
  Administered 2024-07-09: 40 mg via INTRAVENOUS
  Filled 2024-07-08: qty 4

## 2024-07-08 MED ORDER — AMIODARONE HCL 150 MG/3ML IV SOLN
INTRAVENOUS | Status: AC | PRN
Start: 2024-07-08 — End: 2024-07-08
  Administered 2024-07-08: 300 mg via INTRAVENOUS

## 2024-07-08 MED ORDER — IPRATROPIUM-ALBUTEROL 0.5-2.5 (3) MG/3ML IN SOLN
3.0000 mL | RESPIRATORY_TRACT | Status: DC | PRN
  Administered 2024-07-08 – 2024-07-12 (×2): 3 mL via RESPIRATORY_TRACT
  Filled 2024-07-08: qty 3
  Filled 2024-07-08: qty 9

## 2024-07-08 MED ORDER — HEPARIN BOLUS VIA INFUSION
4000.0000 [IU] | Freq: Once | INTRAVENOUS | Status: AC
  Administered 2024-07-09: 4000 [IU] via INTRAVENOUS
  Filled 2024-07-08: qty 4000

## 2024-07-08 MED ORDER — CHLORHEXIDINE GLUCONATE CLOTH 2 % EX PADS
6.0000 | MEDICATED_PAD | Freq: Every day | CUTANEOUS | Status: DC
Start: 2024-07-09 — End: 2024-07-13
  Administered 2024-07-09 – 2024-07-13 (×5): 6 via TOPICAL

## 2024-07-08 MED ORDER — EPINEPHRINE 1 MG/10ML IV SOSY
PREFILLED_SYRINGE | INTRAVENOUS | Status: AC | PRN
Start: 2024-07-08 — End: 2024-07-08
  Administered 2024-07-08: 1 mg via INTRAVENOUS

## 2024-07-08 MED ORDER — POTASSIUM CHLORIDE 10 MEQ/100ML IV SOLN
10.0000 meq | INTRAVENOUS | Status: AC
  Administered 2024-07-09 (×2): 10 meq via INTRAVENOUS
  Filled 2024-07-08 (×2): qty 100

## 2024-07-08 MED ORDER — MAGNESIUM SULFATE 2 GM/50ML IV SOLN
2.0000 g | Freq: Once | INTRAVENOUS | Status: AC
  Administered 2024-07-09: 2 g via INTRAVENOUS
  Filled 2024-07-08: qty 50

## 2024-07-08 MED ORDER — PROPOFOL 1000 MG/100ML IV EMUL
INTRAVENOUS | Status: AC
  Filled 2024-07-08: qty 100

## 2024-07-08 MED ORDER — LEVOTHYROXINE SODIUM 75 MCG PO TABS: 150.0000 ug | ORAL_TABLET | Freq: Every day | ORAL | Status: DC

## 2024-07-08 MED ORDER — ETOMIDATE 2 MG/ML IV SOLN
INTRAVENOUS | Status: AC | PRN
Start: 2024-07-08 — End: 2024-07-08
  Administered 2024-07-08: 20 mg via INTRAVENOUS

## 2024-07-08 MED ORDER — FUROSEMIDE 10 MG/ML IJ SOLN: 40.0000 mg | Freq: Two times a day (BID) | INTRAMUSCULAR | Status: DC

## 2024-07-08 MED ORDER — ROCURONIUM BROMIDE 10 MG/ML (PF) SYRINGE
PREFILLED_SYRINGE | INTRAVENOUS | Status: AC | PRN
Start: 2024-07-08 — End: 2024-07-08
  Administered 2024-07-08: 100 mg via INTRAVENOUS

## 2024-07-08 MED ORDER — ENOXAPARIN SODIUM 40 MG/0.4ML IJ SOSY
40.0000 mg | PREFILLED_SYRINGE | INTRAMUSCULAR | Status: DC
  Filled 2024-07-08: qty 0.4

## 2024-07-08 MED ORDER — FUROSEMIDE 10 MG/ML IJ SOLN: 80.0000 mg | Freq: Two times a day (BID) | INTRAMUSCULAR | Status: DC

## 2024-07-08 MED ORDER — MAGNESIUM SULFATE 2 GM/50ML IV SOLN
2.0000 g | Freq: Once | INTRAVENOUS | Status: AC
  Administered 2024-07-08: 2 g via INTRAVENOUS
  Filled 2024-07-08: qty 50

## 2024-07-08 NOTE — ED Notes (Signed)
 Attending at [facility]

## 2024-07-08 NOTE — ED Notes (Signed)
 Pg admitting doc for stat call at 2149 after I pg  cc to [facility]  then I pg cards to [facility]

## 2024-07-08 NOTE — Progress Notes (Incomplete Revision)
 PHARMACY - ANTICOAGULATION CONSULT NOTE  Pharmacy Consult for heparin  Indication: chest pain/ACS  Allergies  Allergen Reactions   Aluminum-Containing Compounds Other (See Comments)    Makes hands feel weak     Patient Measurements: Height: 5' 8 (172.7 cm) Weight: 86.2 kg (190 lb) IBW/kg (Calculated) : 68.4 HEPARIN  DW (KG): 85.7  Vital Signs: Temp: 98.1 F (36.7 C) (11/07 2028) Temp Source: Oral (11/07 2028) BP: 138/89 (11/07 2210) Pulse Rate: 106 (11/07 2210)  Labs: Recent Labs    07/08/24 0628 07/08/24 0634 07/08/24 0825 07/08/24 0835 07/08/24 1100 07/08/24 2253  HGB 13.3   < >  --  13.9 12.6* 15.3  HCT 43.6   < >  --  41.0 40.2 45.0  PLT 218  --   --   --  179  --   CREATININE 2.71*  --   --   --  2.83*  --   TROPONINIHS 15  --  18*  --   --   --    < > = values in this interval not displayed.    Estimated Creatinine Clearance: 24.8 mL/min (A) (by C-G formula based on SCr of 2.83 mg/dL (H)).   Medical History: Past Medical History:  Diagnosis Date   Acute MI, lateral wall, initial episode of care (HCC) 04/18/2015   CAD (coronary artery disease) 04/18/2015   a. Lat STEMI 8/16 - LHC:  pLAD 90, mLAD 50, D1 100, dLCx 90, OM1 80, pRCA 75, EF 35-45% >> DES to D1;  b. staged PCI 04/20/15 - DES to mid LAD and DES to prox RCA   Depression    DM2 (diabetes mellitus, type 2) (HCC)    Hypothyroidism    Ischemic cardiomyopathy    a. Echo 8/16:  Mild LVH, EF 40-45%, mid-apical anterolateral and apical AK, grade 1 diastolic dysfunction, trivial effusion   OSA (obstructive sleep apnea) 09/21/2018   ST elevation myocardial infarction (STEMI) of lateral wall (HCC) 04/18/2015   Assessment: 36 yoM presented with SOB/HF exacerbation. PMH includes: HFrEF (lVEF 15-20% in 2024), CAD (staged PCI in 2016), HTN, HLD, T2DM, CKD stage IV, OSA, tobacco use. Pharmacy consulted to dose heparin  for ACS.  -PM: patient found unresponsive/ diaphoretic with no pulse >> Vfib > defibrillated  > ROSC -No oral anticoagulation PTA -Hgb 12.6, plts 179 -Trops 18 -lovenox ordered for DVT ppx, not given  Goal of Therapy:  Heparin  level 0.3-0.7 units/ml Monitor platelets by anticoagulation protocol: Yes   Plan:  Give 4000 units bolus x 1 Start heparin  infusion at 1100 units/hr Check anti-Xa level in 8 hours and daily while on heparin  Continue to monitor H&H and platelets  Lynwood Poplar, PharmD, BCPS Clinical Pharmacist 07/08/2024 11:36 PM

## 2024-07-08 NOTE — H&P (Incomplete)
 Nicolas Ray, MRN:  [id number], DOB:  1951-05-03, LOS: 0 ADMISSION DATE:  07/08/2024, CONSULTATION DATE:  11/7 REFERRING MD:  Simon, EDP, CHIEF COMPLAINT:  vfib arrest   History of Present Illness:  73 year old male with past medical history of hypothyroidism, diabetes, STEMI, ischemic cardiomyopathy, OSA who initially presented to the emergency department early this morning on 11/7  Pertinent  Medical History  ***  Significant Hospital Events: Including procedures, antibiotic start and stop dates in addition to other pertinent events     Interim History / Subjective:  ***  Objective   Blood pressure 138/89, pulse (!) 106, temperature 98.1 F (36.7 C), temperature source Oral, resp. rate 20, height 5' 8 (1.727 m), weight 86.2 kg, SpO2 97%.    Vent Mode: PRVC FiO2 (%):  [50 %-100 %] 70 % Set Rate:  [12 bmp-24 bmp] 24 bmp Vt Set:  [550 mL] 550 mL PEEP:  [5 cmH20-6 cmH20] 5 cmH20 Plateau Pressure:  [17 cmH20-18 cmH20] 18 cmH20  No intake or output data in the 24 hours ending 07/08/24 2344 Filed Weights   07/08/24 1952  Weight: 86.2 kg    Examination: General: *** HENT: *** Lungs: *** Cardiovascular: *** Abdomen: *** Extremities: *** Neuro: *** GU: ***  {Icu pulm iv lines:31576::A-line, Day# ***,PIC line, Day# ***,Central line, Day# ***,Norva Purl, Day# ***,IV, Day# ***} Resolved Hospital Problem list   ***  Assessment & Plan:  ***   Labs   CBC: Recent Labs  Lab 07/08/24 0628 07/08/24 0634 07/08/24 0835 07/08/24 1100 07/08/24 2253  WBC 9.0  --   --  9.1  --   NEUTROABS 5.1  --   --   --   --   HGB 13.3 16.0 13.9 12.6* 15.3  HCT 43.6 47.0 41.0 40.2 45.0  MCV 70.9*  --   --  69.9*  --   PLT 218  --   --  179  --     Basic Metabolic Panel: Recent Labs  Lab 07/08/24 0628 07/08/24 0634 07/08/24 0835 07/08/24 1100 07/08/24 2253  NA 136 138 139  --  136  K 4.9 4.4 4.2  --  3.7  CL 104  --   --   --   --   CO2 19*  --   --   --   --    GLUCOSE 154*  --   --   --   --   BUN 47*  --   --   --   --   CREATININE 2.71*  --   --  2.83*  --   CALCIUM  8.4*  --   --   --   --    GFR: Estimated Creatinine Clearance: 24.8 mL/min (A) (by C-G formula based on SCr of 2.83 mg/dL (H)). Recent Labs  Lab 07/08/24 0628 07/08/24 1100  WBC 9.0 9.1    Liver Function Tests: No results for input(s): AST, ALT, ALKPHOS, BILITOT, PROT, ALBUMIN in the last 168 hours. No results for input(s): LIPASE, AMYLASE in the last 168 hours. No results for input(s): AMMONIA in the last 168 hours.  ABG    Component Value Date/Time   PHART 7.222 (L) 07/08/2024 2253   PCO2ART 52.1 (H) 07/08/2024 2253   PO2ART 186 (H) 07/08/2024 2253   HCO3 21.5 07/08/2024 2253   TCO2 23 07/08/2024 2253   ACIDBASEDEF 7.0 (H) 07/08/2024 2253   O2SAT 99 07/08/2024 2253     Coagulation Profile: No results for input(s): INR,  PROTIME in the last 168 hours.  Cardiac Enzymes: No results for input(s): CKTOTAL, CKMB, CKMBINDEX, TROPONINI in the last 168 hours.  HbA1C: Hgb A1c MFr Bld  Date/Time Value Ref Range Status  07/08/2024 08:14 PM 5.9 (H) 4.8 - 5.6 % Final    Comment:    (NOTE) Diagnosis of Diabetes The following HbA1c ranges recommended by the American Diabetes Association (ADA) may be used as an aid in the diagnosis of diabetes mellitus.  Hemoglobin             Suggested A1C NGSP%              Diagnosis  <5.7                   Non Diabetic  5.7-6.4                Pre-Diabetic  >6.4                   Diabetic  <7.0                   Glycemic control for                       adults with diabetes.    12/08/2023 12:25 PM 6.4 (H) 4.8 - 5.6 % Final    Comment:    (NOTE)         Prediabetes: 5.7 - 6.4         Diabetes: >6.4         Glycemic control for adults with diabetes: <7.0     CBG: Recent Labs  Lab 07/08/24 1917 07/08/24 2004  GLUCAP 180* 159*    Review of Systems:   ***  Past Medical History:   He,  has a past medical history of Acute MI, lateral wall, initial episode of care (HCC) (04/18/2015), CAD (coronary artery disease) (04/18/2015), Depression, DM2 (diabetes mellitus, type 2) (HCC), Hypothyroidism, Ischemic cardiomyopathy, OSA (obstructive sleep apnea) (09/21/2018), and ST elevation myocardial infarction (STEMI) of lateral wall (HCC) (04/18/2015).   Surgical History:   Past Surgical History:  Procedure Laterality Date   BIOPSY  07/18/2023   Procedure: BIOPSY;  Surgeon: Stacia Glendia BRAVO, MD;  Location: [facility] ENDOSCOPY;  Service: Gastroenterology;;   CARDIAC CATHETERIZATION N/A 04/18/2015   Procedure: Left Heart Cath and Coronary Angiography;  Surgeon: Ozell Fell, MD;  Location: [facility] INVASIVE CV LAB;  Service: Cardiovascular;  Laterality: N/A;   CARDIAC CATHETERIZATION N/A 04/18/2015   Procedure: Coronary Stent Intervention;  Surgeon: Ozell Fell, MD;  Location: [facility] INVASIVE CV LAB;  Service: Cardiovascular;  Laterality: N/A;   CARDIAC CATHETERIZATION N/A 04/20/2015   Procedure: Coronary Stent Intervention;  Surgeon: Lonni JONETTA Cash, MD;  Location: [facility] INVASIVE CV LAB;  Service: Cardiovascular;  Laterality: N/A;   CORONARY STENT PLACEMENT     ESOPHAGOGASTRODUODENOSCOPY N/A 07/18/2023   Procedure: ESOPHAGOGASTRODUODENOSCOPY (EGD);  Surgeon: Stacia Glendia BRAVO, MD;  Location: [facility] ENDOSCOPY;  Service: Gastroenterology;  Laterality: N/A;   RIGHT/LEFT HEART CATH AND CORONARY ANGIOGRAPHY N/A 12/09/2022   Procedure: RIGHT/LEFT HEART CATH AND CORONARY ANGIOGRAPHY;  Surgeon: Fell Ozell, MD;  Location: [facility] INVASIVE CV LAB;  Service: Cardiovascular;  Laterality: N/A;     Social History:   reports that he has been smoking cigarettes. He has never used smokeless tobacco. He reports that he does not drink alcohol  and does not use drugs.   Family History:  His family history includes Cancer in his father;  Heart disease in his mother; Stroke in his brother; Thyroid  disease in his  sister. There is no history of Colon cancer, Diabetes, Liver cancer, or Esophageal cancer.   Allergies Allergies  Allergen Reactions   Aluminum-Containing Compounds Other (See Comments)    Makes hands feel weak      Home Medications  Prior to Admission medications   Medication Sig Start Date End Date Taking? Authorizing Provider  clopidogrel  (PLAVIX ) 75 MG tablet TAKE 1 TABLET(75 MG) BY MOUTH DAILY 12/11/23  Yes Nieves Cough, MD  levothyroxine  (SYNTHROID ) 150 MCG tablet TAKE 1 TABLET(150 MCG) BY MOUTH DAILY BEFORE BREAKFAST 05/03/24  Yes Thedora Garnette HERO, MD  sildenafil (REVATIO) 20 MG tablet SMARTSIG:1-5 Tablet(s) By Mouth 02/11/24   [provider]     Critical care time: ***

## 2024-07-08 NOTE — ED Notes (Addendum)
 CCMD called charge nurse to report abnormal rhythm on monitor. ER Rn's entered room to find patient in recliner unresponsive, diaphoretic with no pulse. Placed patient onto stretcher and high quality compressions started. Patient placed on defib pads, noted to be in Vfib. Per responding MD, patient defibrillated at 200J. ROSC obtained, patient intubated by responding MD.

## 2024-07-08 NOTE — H&P (Addendum)
 History and Physical    PatientBETHA Ray Ray FMW:[id number] DOB: Feb 16, 1951 DOA: 07/08/2024 DOS: the patient was seen and examined on 07/08/2024 PCP: Thedora Garnette HERO, MD  Patient coming from: Home  Chief Complaint:  Chief Complaint  Patient presents with   Respiratory Distress   HPI: Nicolas Ray is a 73 y.o. male with medical history significant of tobacco abuse (15 pack-years; active smoker of 1/3 pack/day), STEMI in 04/2015, CAD s/p PCI x3 in 2016 (D1, mid LAD and prox RCA), ICM, HFrEF, DM2, hypothyroidism, GIB in 07/2203 (discharged on Plavix  alone), and OSA who p/w DOE, elevated BNP, and BLE edema c/w HFrEF exacerbation.  The patient experienced difficulty breathing approximately two hours after consuming an apple early this morning at around 5:00 AM. The patient reported that the difficulty was specifically with breathing and did not include trouble swallowing. The patient did not take any medication at the time of the incident. Instead, the patient's sister called emergency services, and the patient was brought to the hospital. Prior to this event, the patient did not feel ill and was not around anyone who was sick.  In the ED, pt tachypneic w/ hypoxia (required BiPAP and transition to RA after IV lasix  80mg  x1 in ED). Labs notable for Cr 2.71-->2.83, and BNP 648. XR w/ pulmonary vascular congestion. EDP gave IV lasix  80mg  x1 and requested medicine admission.   Review of Systems: As mentioned in the history of present illness. All other systems reviewed and are negative. Past Medical History:  Diagnosis Date   Acute MI, lateral wall, initial episode of care (HCC) 04/18/2015   CAD (coronary artery disease) 04/18/2015   a. Lat STEMI 8/16 - LHC:  pLAD 90, mLAD 50, D1 100, dLCx 90, OM1 80, pRCA 75, EF 35-45% >> DES to D1;  b. staged PCI 04/20/15 - DES to mid LAD and DES to prox RCA   Depression    DM2 (diabetes mellitus, type 2) (HCC)    Hypothyroidism    Ischemic cardiomyopathy    a.  Echo 8/16:  Mild LVH, EF 40-45%, mid-apical anterolateral and apical AK, grade 1 diastolic dysfunction, trivial effusion   OSA (obstructive sleep apnea) 09/21/2018   ST elevation myocardial infarction (STEMI) of lateral wall (HCC) 04/18/2015   Past Surgical History:  Procedure Laterality Date   BIOPSY  07/18/2023   Procedure: BIOPSY;  Surgeon: Stacia Glendia BRAVO, MD;  Location: [facility] ENDOSCOPY;  Service: Gastroenterology;;   CARDIAC CATHETERIZATION N/A 04/18/2015   Procedure: Left Heart Cath and Coronary Angiography;  Surgeon: Ozell Fell, MD;  Location: [facility] INVASIVE CV LAB;  Service: Cardiovascular;  Laterality: N/A;   CARDIAC CATHETERIZATION N/A 04/18/2015   Procedure: Coronary Stent Intervention;  Surgeon: Ozell Fell, MD;  Location: [facility] INVASIVE CV LAB;  Service: Cardiovascular;  Laterality: N/A;   CARDIAC CATHETERIZATION N/A 04/20/2015   Procedure: Coronary Stent Intervention;  Surgeon: Lonni JONETTA Cash, MD;  Location: [facility] INVASIVE CV LAB;  Service: Cardiovascular;  Laterality: N/A;   CORONARY STENT PLACEMENT     ESOPHAGOGASTRODUODENOSCOPY N/A 07/18/2023   Procedure: ESOPHAGOGASTRODUODENOSCOPY (EGD);  Surgeon: Stacia Glendia BRAVO, MD;  Location: [facility] ENDOSCOPY;  Service: Gastroenterology;  Laterality: N/A;   RIGHT/LEFT HEART CATH AND CORONARY ANGIOGRAPHY N/A 12/09/2022   Procedure: RIGHT/LEFT HEART CATH AND CORONARY ANGIOGRAPHY;  Surgeon: Fell Ozell, MD;  Location: [facility] INVASIVE CV LAB;  Service: Cardiovascular;  Laterality: N/A;   Social History:  reports that he has been smoking cigarettes. He has never used smokeless tobacco. He reports that  he does not drink alcohol  and does not use drugs.  Allergies  Allergen Reactions   Aluminum-Containing Compounds Other (See Comments)    Makes hands feel weak     Family History  Problem Relation Age of Onset   Heart disease Mother    Cancer Father        Neck   Thyroid  disease Sister    Stroke Brother    Colon cancer Neg Hx     Diabetes Neg Hx    Liver cancer Neg Hx    Esophageal cancer Neg Hx     Prior to Admission medications   Medication Sig Start Date End Date Taking? Authorizing Provider  clopidogrel  (PLAVIX ) 75 MG tablet TAKE 1 TABLET(75 MG) BY MOUTH DAILY 12/11/23  Yes Nieves Cough, MD  levothyroxine  (SYNTHROID ) 150 MCG tablet TAKE 1 TABLET(150 MCG) BY MOUTH DAILY BEFORE BREAKFAST 05/03/24  Yes Thedora Garnette HERO, MD  sildenafil (REVATIO) 20 MG tablet SMARTSIG:1-5 Tablet(s) By Mouth 02/11/24   [provider]    Physical Exam: Vitals:   07/08/24 1131 07/08/24 1145 07/08/24 1300 07/08/24 1315  BP:  123/74 137/77 (!) 141/81  Pulse: 78 77 83 86  Resp: (!) 22 20 (!) 27 (!) 21  Temp:      TempSrc:      SpO2: 97% 95% 99% 99%  Height:       General: Alert, oriented x3, resting comfortably in no acute distress Respiratory: bibasliar rales; no wheezing Cardiovascular: Regular rate and rhythm w/o m/r/g MSK: BLE 2+ pitting edema to calves   Data Reviewed:  Lab Results  Component Value Date   WBC 9.1 07/08/2024   HGB 12.6 (L) 07/08/2024   HCT 40.2 07/08/2024   MCV 69.9 (L) 07/08/2024   PLT 179 07/08/2024   Lab Results  Component Value Date   GLUCOSE 154 (H) 07/08/2024   CALCIUM  8.4 (L) 07/08/2024   NA 139 07/08/2024   K 4.2 07/08/2024   CO2 19 (L) 07/08/2024   CL 104 07/08/2024   BUN 47 (H) 07/08/2024   CREATININE 2.83 (H) 07/08/2024   Lab Results  Component Value Date   ALT 15 05/24/2023   AST 16 05/24/2023   ALKPHOS 56 05/24/2023   BILITOT 0.6 05/24/2023   No results found for: INR, PROTIME Radiology: [facility] Chest Port 1 View Result Date: 07/08/2024 EXAM: 1 VIEW(S) XRAY OF THE CHEST 07/08/2024 06:37:00 AM COMPARISON: 07/17/2023 CLINICAL HISTORY: Dyspnea FINDINGS: LUNGS AND PLEURA: Increased pulmonary vascularity with mild interstitial edema. No focal pulmonary opacity. No pleural effusion. No consolidative change. No pneumothorax. HEART AND MEDIASTINUM: Aortic atherosclerotic  calcifications. BONES AND SOFT TISSUES: No acute osseous abnormality. IMPRESSION: 1. Pulmonary interstitial edema with increased pulmonary vascularity, mild in severity. Electronically signed by: Waddell Calk MD 07/08/2024 06:45 AM EST RP Workstation: [id number]    Assessment and Plan: 4M h/o tobacco abuse (15 pack-years; active smoker of 1/3 pack/day), STEMI in 04/2015, CAD s/p PCI x3 in 2016 (D1, mid LAD and prox RCA), ICM, HFrEF, DM2, hypothyroidism, GIB in 07/2203 (discharged on Plavix  alone), and OSA who p/w DOE, elevated BNP, and BLE edema c/w HFrEF exacerbation.  HFrEF exacerbation -Heart Failure Navigator consulted; apprec eval/recs -Cards consulted; apprec eval/recs -IV lasix  40mg  BID for now; goal net neg 1-2L/d; strict I/Os; daily standing weights; K>4/Mg>2 -Defer GDMT to Cards  CAD s/p PCI x3 in 2016 (D1, mid LAD and prox RCA) CADSTEMI in 04/2015 ICM -PTA Plavix  75mg  daily only in light of recent GIB in 07/2023  DM2 -  SSI TID AC prn -F/u A1c; if A1c >10 will need diabetes coordinator consult -Carb controlled diet  Hypothyroid -PTA Synthroid  150mcg daily -F/u TSH  OSA -CPAP at bedtime  Tobacco abuse -Nicotine patch 14mg  daily -Smoking cessation counseling provided   Advance Care Planning:   Code Status: Full Code   Consults: Cards  Family Communication: Sister  Severity of Illness: The appropriate patient status for this patient is INPATIENT. Inpatient status is judged to be reasonable and necessary in order to provide the required intensity of service to ensure the patient's safety. The patient's presenting symptoms, physical exam findings, and initial radiographic and laboratory data in the context of their chronic comorbidities is felt to place them at high risk for further clinical deterioration. Furthermore, it is not anticipated that the patient will be medically stable for discharge from the hospital within 2 midnights of admission.   * I certify that at  the point of admission it is my clinical judgment that the patient will require inpatient hospital care spanning beyond 2 midnights from the point of admission due to high intensity of service, high risk for further deterioration and high frequency of surveillance required.*   ------- I spent 57 minutes reviewing previous notes, at the bedside counseling/discussing the treatment plan, and performing clinical documentation.  Author: Marsha Ada, MD 07/08/2024 1:30 PM  For on call review [email or web address].

## 2024-07-08 NOTE — ED Notes (Signed)
 RT at [facility].

## 2024-07-08 NOTE — ED Provider Notes (Signed)
 [place] EMERGENCY DEPARTMENT AT [facility] Provider Note   CSN: [id number] Arrival date & time: 07/08/24  9379     Patient presents with: Respiratory Distress   Nicolas Ray is a 73 y.o. male with past medical history of HFrEF (EF 35-40%), hypertension, and BPH presenting to the emergency department for shortness of breath.  Patient endorses increased shortness of breath over the next 3 hours. Patient was initiated on CPAP en route with EMS. Per EMS, patient is not taking any diuretics at this time. On arrival, patient is tachypneic with increased work of breathing, he was transitioned to BiPAP.   HPI     Prior to Admission medications   Medication Sig Start Date End Date Taking? Authorizing Provider  clopidogrel  (PLAVIX ) 75 MG tablet TAKE 1 TABLET(75 MG) BY MOUTH DAILY 12/11/23  Yes Nieves Cough, MD  levothyroxine  (SYNTHROID ) 150 MCG tablet TAKE 1 TABLET(150 MCG) BY MOUTH DAILY BEFORE BREAKFAST 05/03/24  Yes Thedora Garnette HERO, MD  sildenafil (REVATIO) 20 MG tablet SMARTSIG:1-5 Tablet(s) By Mouth 02/11/24   [provider]    Allergies: Aluminum-containing compounds    Review of Systems  Updated Vital Signs BP (!) 141/81   Pulse 86   Temp 97.8 F (36.6 C) (Temporal)   Resp (!) 21   Ht 5' 8 (1.727 m)   SpO2 99%   BMI 26.61 kg/m   Physical Exam Vitals and nursing note reviewed.  Constitutional:      Appearance: He is well-developed. He is ill-appearing.  HENT:     Head: Normocephalic and atraumatic.  Eyes:     Conjunctiva/sclera: Conjunctivae normal.  Cardiovascular:     Rate and Rhythm: Normal rate and regular rhythm.     Heart sounds: No murmur heard. Pulmonary:     Effort: Respiratory distress present.     Breath sounds: Wheezing and rhonchi present.  Abdominal:     Palpations: Abdomen is soft.     Tenderness: There is no abdominal tenderness.  Musculoskeletal:     Right lower leg: Edema present.     Left lower leg: Edema present.   Skin:    General: Skin is warm and dry.     Capillary Refill: Capillary refill takes less than 2 seconds.  Neurological:     Mental Status: He is alert.     (all labs ordered are listed, but only abnormal results are displayed) Labs Reviewed  BASIC METABOLIC PANEL WITH GFR - Abnormal; Notable for the following components:      Result Value   CO2 19 (*)    Glucose, Bld 154 (*)    BUN 47 (*)    Creatinine, Ser 2.71 (*)    Calcium  8.4 (*)    GFR, Estimated 24 (*)    All other components within normal limits  CBC WITH DIFFERENTIAL/PLATELET - Abnormal; Notable for the following components:   RBC 6.15 (*)    MCV 70.9 (*)    MCH 21.6 (*)    All other components within normal limits  BRAIN NATRIURETIC PEPTIDE - Abnormal; Notable for the following components:   B Natriuretic Peptide 648.0 (*)    All other components within normal limits  CBC - Abnormal; Notable for the following components:   Hemoglobin 12.6 (*)    MCV 69.9 (*)    MCH 21.9 (*)    All other components within normal limits  CREATININE, SERUM - Abnormal; Notable for the following components:   Creatinine, Ser 2.83 (*)  GFR, Estimated 23 (*)    All other components within normal limits  I-STAT VENOUS BLOOD GAS, ED - Abnormal; Notable for the following components:   pH, Ven 7.228 (*)    pO2, Ven 26 (*)    Acid-base deficit 5.0 (*)    All other components within normal limits  I-STAT VENOUS BLOOD GAS, ED - Abnormal; Notable for the following components:   pH, Ven 7.240 (*)    Acid-base deficit 6.0 (*)    Calcium , Ion 1.14 (*)    All other components within normal limits  TROPONIN I (HIGH SENSITIVITY) - Abnormal; Notable for the following components:   Troponin I (High Sensitivity) 18 (*)    All other components within normal limits  RESP PANEL BY RT-PCR (RSV, FLU A&B, COVID)  RVPGX2  HEMOGLOBIN A1C  TSH  TROPONIN I (HIGH SENSITIVITY)    EKG: None  Radiology: DG Chest Port 1 View Result Date:  07/08/2024 EXAM: 1 VIEW(S) XRAY OF THE CHEST 07/08/2024 06:37:00 AM COMPARISON: 07/17/2023 CLINICAL HISTORY: Dyspnea FINDINGS: LUNGS AND PLEURA: Increased pulmonary vascularity with mild interstitial edema. No focal pulmonary opacity. No pleural effusion. No consolidative change. No pneumothorax. HEART AND MEDIASTINUM: Aortic atherosclerotic calcifications. BONES AND SOFT TISSUES: No acute osseous abnormality. IMPRESSION: 1. Pulmonary interstitial edema with increased pulmonary vascularity, mild in severity. Electronically signed by: Waddell Calk MD 07/08/2024 06:45 AM EST RP Workstation: [id number]     Procedures   Medications Ordered in the ED  ipratropium-albuterol (DUONEB) 0.5-2.5 (3) MG/3ML nebulizer solution 3 mL (3 mLs Nebulization Given 07/08/24 0648)  enoxaparin (LOVENOX) injection 40 mg (has no administration in time range)  clopidogrel  (PLAVIX ) tablet 75 mg (75 mg Oral Given 07/08/24 1416)  levothyroxine  (SYNTHROID ) tablet 150 mcg (has no administration in time range)  insulin  aspart (novoLOG ) injection 0-15 Units (has no administration in time range)  nicotine (NICODERM CQ - dosed in mg/24 hours) patch 14 mg (14 mg Transdermal Not Given 07/08/24 1418)  furosemide  (LASIX ) injection 40 mg (has no administration in time range)  furosemide  (LASIX ) injection 80 mg (80 mg Intravenous Given 07/08/24 0645)    Clinical Course as of 07/08/24 1531  Fri Jul 08, 2024  0653 I-Stat venous blood gas, ED(!!) VBG with mild acidosis pH 7.228, no significant hypercarbia [LB]  0723 CBC with Differential(!) No leukocytosis or anemia [LB]  0723 DG Chest Port 1 View Pulmonary edema [LB]    Clinical Course User Index [LB] Sharlet Dowdy, MD                                 Medical Decision Making Patient is a 73 y/o M with history of HFrEF presenting for SOB x 3 hours now on BiPAP. On initial evaluation, patient was hypertensive, tachypneic with increased WOB and course lung sounds.   Differential  diagnosis includes but not limited to HF exacerbation, viral illness, PNA, SCAPE, PE  Patient was continued on BiPAP with improvement in his work of breathing and blood pressure. Duonebs were bled through patient's BiPAP.  Point-of-care ultrasound revealed a moderately reduced EF and diffuse B-lines in all lung fields.  This was reported by chest x-ray with evidence of pulmonary congestion and edema.  Patient given IV Lasix  for diuresis.  EKG with normal sinus rhythm, nonspecific ST changes which are unchanged from his previous EKG.  Lab without significant leukocytosis, anemia, or electrolyte abnormality.  Creatinine is elevated but consistent with patient's baseline.  Initial troponin 15, BNP elevated at 648.  On reevaluation, patient reports significant proved meant in his shortness of breath.  Repeat VBG with persistent acidosis but improvement in his pH to 7.24.  Patient's presentation was consistent with acute CHF exacerbation.  He requires admission for continued IV diuretic and treatment with BiPAP.  Hospitalist was consulted who agreed to admit patient to the stepdown for continued care.  Please see admitting for the remainder patient's clinical course.      Amount and/or Complexity of Data Reviewed Independent Historian: EMS External Data Reviewed: ECG and notes. Labs: ordered. Decision-making details documented in ED Course. Radiology: ordered and independent interpretation performed. Decision-making details documented in ED Course. ECG/medicine tests: ordered and independent interpretation performed.  Risk Prescription drug management. Decision regarding hospitalization.        Final diagnoses:  Acute congestive heart failure, unspecified heart failure type (HCC)  Acute respiratory failure with hypoxia [facility])    ED Discharge Orders     None          Sharlet Dowdy, MD 07/08/24 1532    Palumbo, April, MD 07/08/24 2333

## 2024-07-08 NOTE — Consult Note (Addendum)
 Cardiology Consultation   Patient ID: Nicolas Ray MRN: [id number]; DOB: Feb 01, 1951  Admit date: 07/08/2024 Date of Consult: 07/08/2024  PCP:  Nicolas Garnette HERO, MD    Ray Providers Cardiologist:  Nicolas Fell, MD        Patient Profile: Nicolas Ray is a 73 y.o. male with a history of CAD with STEMI in 04/2015 s/p DES to D1 and staged PCI with DES to LAD and RCA, ischemic cardiomyopathy/ chronic HFrEF with EF of 15-20% in 10/2022, hypertension, hyperlipidemia, type 2 diabetes mellitus, CKD stage IV,  right hemidiaphragm paralysis, hypothyroidism, obstructive sleep apnea, tobacco use, and non-compliance who is being seen 07/08/2024 for the evaluation of CHF at the request of Dr. Georgina.  History of Present Illness: Nicolas Ray is a 73 year old male with the above history who is followed by Dr. Fell.  He has a history of a lateral STEMI in 04/2015.  Emergent LHC showed total occlusion of the 1st Diagn (culprit lesion), 90% stenosis of proximal LAD followed by 50% stenosis of mid LAD, 80% stenosis of OM1, 90% stenosis of distal LCx, and 75% stenosis of proximal RCA.  He underwent successful PCI with DES to the 1st Diag lesion. Echo showed LVEF of 40-45% with akinesis of the mid apical anterolateral wall and apical myocardium.  He was then seen by CT surgery who recommended CABG given his severe multivessel disease.  However, patient declined.  Therefore, he underwent staged PCI with DES to mid LAD and proximal RCA. Since that time, he has had progressively worsening LV function. Unfortunately, he has a long history of medication non-compliance and multiple medication intolerances. He has stopped medications on his own several times in the past.  This has made it difficult to get him on guideline therapy. Echo in 12/2020 showed LVEF of 35 to 40% with akinesis of the apical septal wall and apical segment as well as grade 1 diastolic dysfunction.  He was admitted at [facility]  in [place] in 10/2022 for acute on chronic  CHF and hypertensive emergency. Echo showed LVEF of 15-20% with severe global hypokinesis and grade 3 diastolic dysfunction as well as diffuse thickening of the aortic valve  with restricted cusp opening and hemodynamically significant valvular aortic stenosis could not be excluded. Repeat limited Echo for further assessment of the aortic valve showed mild to moderate aortic stenosis with mean gradient of 9.5 mmHg. He was subsequently seen by Dr. Fell and in 12/2022 and R/ LHC was arranged. This showed continued patency of the LAD and 1st Diag stents with mild diffuse in-stent restenosis in both vessels and moderate distal edge restenosis in the LAD and the Diag, continued patency of the RCA stent with mild diffuse in-stent restenosis and mild to moderate distal RCA and proximal PDA stenoses, and stable but severe stenosis of a small OM1 and CTO of the mid LCx but there were left to right collaterals filling the distal OM. Also showed normal filling pressure and preserved cardiac output. Continued medical therapy was recommended.   He was last seen by Dr. Fell in 10/2023 for pre-op evaluation for upcoming prostate surgery. At that time he reported doing well with no cardiac complaints. He was only taking 2 medications at that time (Plavix  and Synthroid ).   Patient presented to the ED via EMS for further evaluation of shortness of breath. He was started on CPAP en route to the ED.  EKG showed normal sinus rhythm with non-specific T wave  changes. High-sensitivity troponin 15 >> 18.  BNP elevated at 648. Chest x-ray showed pulmonary interstitial edema with increase pulmonary vascularity. WBC 9.0, Hgb 13.3, Plts 218. Na 136, K 4.9, Glucose 154, BUN 47, Cr 2.71. I-stat VBG showed 7.228, pO2 26, pCO2 55.7, and Bicarb 23.2. He was switched to BiPAP in the ED was given a dose of IV Lasix .   Patient reports he was in his usual state of health until last night. He states  he ate an apple last night and then about 1 hour later had sudden onset of shortness of breath. He describes orthopnea last night and was unable to sleep. He eventually called 911 this morning. He reports some occasional dyspnea on exertion with more strenuous activities like raking the leaves for 30-40 minutes but denies any significant shortness of breath at baseline until last night. He denies any shortness of breath at rest, orthopnea, or PND. He denies any lower extremity edema or abdominal distension. He does not weigh himself at home. No chest pain, palpitations, lightheadedness/ dizziness, or syncope. No recent fevers or illnesses. No GI symptoms. No abnormal bleeding in urine or stools.   Per review of EMS run sheet, initial systolic BP was 220 so suspect he had flash pulmonary.  Past Medical History:  Diagnosis Date   Acute MI, lateral wall, initial episode of care (HCC) 04/18/2015   CAD (coronary artery disease) 04/18/2015   a. Lat STEMI 8/16 - LHC:  pLAD 90, mLAD 50, D1 100, dLCx 90, OM1 80, pRCA 75, EF 35-45% >> DES to D1;  b. staged PCI 04/20/15 - DES to mid LAD and DES to prox RCA   Depression    DM2 (diabetes mellitus, type 2) (HCC)    Hypothyroidism    Ischemic cardiomyopathy    a. Echo 8/16:  Mild LVH, EF 40-45%, mid-apical anterolateral and apical AK, grade 1 diastolic dysfunction, trivial effusion   OSA (obstructive sleep apnea) 09/21/2018   ST elevation myocardial infarction (STEMI) of lateral wall (HCC) 04/18/2015    Past Surgical History:  Procedure Laterality Date   BIOPSY  07/18/2023   Procedure: BIOPSY;  Surgeon: Stacia Glendia BRAVO, MD;  Location: [facility] ENDOSCOPY;  Service: Gastroenterology;;   CARDIAC CATHETERIZATION N/A 04/18/2015   Procedure: Left Heart Cath and Coronary Angiography;  Surgeon: Nicolas Fell, MD;  Location: [facility] INVASIVE CV LAB;  Service: Cardiovascular;  Laterality: N/A;   CARDIAC CATHETERIZATION N/A 04/18/2015   Procedure: Coronary Stent  Intervention;  Surgeon: Nicolas Fell, MD;  Location: [facility] INVASIVE CV LAB;  Service: Cardiovascular;  Laterality: N/A;   CARDIAC CATHETERIZATION N/A 04/20/2015   Procedure: Coronary Stent Intervention;  Surgeon: Lonni JONETTA Cash, MD;  Location: [facility] INVASIVE CV LAB;  Service: Cardiovascular;  Laterality: N/A;   CORONARY STENT PLACEMENT     ESOPHAGOGASTRODUODENOSCOPY N/A 07/18/2023   Procedure: ESOPHAGOGASTRODUODENOSCOPY (EGD);  Surgeon: Stacia Glendia BRAVO, MD;  Location: [facility] ENDOSCOPY;  Service: Gastroenterology;  Laterality: N/A;   RIGHT/LEFT HEART CATH AND CORONARY ANGIOGRAPHY N/A 12/09/2022   Procedure: RIGHT/LEFT HEART CATH AND CORONARY ANGIOGRAPHY;  Surgeon: Ray Ozell, MD;  Location: [facility] INVASIVE CV LAB;  Service: Cardiovascular;  Laterality: N/A;     Home Medications:  Prior to Admission medications   Medication Sig Start Date End Date Taking? Authorizing Provider  clopidogrel  (PLAVIX ) 75 MG tablet TAKE 1 TABLET(75 MG) BY MOUTH DAILY 12/11/23  Yes Nieves Cough, MD  levothyroxine  (SYNTHROID ) 150 MCG tablet TAKE 1 TABLET(150 MCG) BY MOUTH DAILY BEFORE BREAKFAST 05/03/24  Yes Nicolas Garnette HERO,  MD  sildenafil (REVATIO) 20 MG tablet SMARTSIG:1-5 Tablet(s) By Mouth 02/11/24   [provider]    Scheduled Meds:  clopidogrel   75 mg Oral Daily   enoxaparin (LOVENOX) injection  40 mg Subcutaneous Q24H   furosemide   40 mg Intravenous BID   insulin  aspart  0-15 Units Subcutaneous TID WC   [START ON 07/09/2024] levothyroxine   150 mcg Oral Q0600   nicotine  14 mg Transdermal Daily   Continuous Infusions:  PRN Meds: ipratropium-albuterol  Allergies:    Allergies  Allergen Reactions   Aluminum-Containing Compounds Other (See Comments)    Makes hands feel weak     Social History:   Social History   Socioeconomic History   Marital status: Single    Spouse name: Not on file   Number of children: 3   Years of education: Not on file   Highest education level: Not on file   Occupational History   Occupation: Retired- Airline Pilot  Tobacco Use   Smoking status: Light Smoker    Current packs/day: 0.25    Types: Cigarettes   Smokeless tobacco: Never   Tobacco comments:    1-3 cigarettes per day 12.4.19  Vaping Use   Vaping status: Never Used  Substance and Sexual Activity   Alcohol  use: Never   Drug use: No   Sexual activity: Not Currently  Other Topics Concern   Not on file  Social History Narrative   Right Handed    Lives in a ranch style home with a basement.    Social Drivers of Corporate Investment Banker Strain: Not on file  Food Insecurity: No Food Insecurity (07/18/2023)   Hunger Vital Sign    Worried About Running Out of Food in the Last Year: Never true    Ran Out of Food in the Last Year: Never true  Transportation Needs: No Transportation Needs (07/18/2023)   PRAPARE - Administrator, Civil Service (Medical): No    Lack of Transportation (Non-Medical): No  Physical Activity: Not on file  Stress: Not on file  Social Connections: Unknown (01/13/2022)   Received from [facility]   Social Network    Social Network: Not on file  Intimate Partner Violence: Not At Risk (07/18/2023)   Humiliation, Afraid, Rape, and Kick questionnaire    Fear of Current or Ex-Partner: No    Emotionally Abused: No    Physically Abused: No    Sexually Abused: No    Family History:   Family History  Problem Relation Age of Onset   Heart disease Mother    Cancer Father        Neck   Thyroid  disease Sister    Stroke Brother    Colon cancer Neg Hx    Diabetes Neg Hx    Liver cancer Neg Hx    Esophageal cancer Neg Hx      ROS:  Please see the history of present illness.     Physical Exam/Data: Vitals:   07/08/24 1131 07/08/24 1145 07/08/24 1300 07/08/24 1315  BP:  123/74 137/77 (!) 141/81  Pulse: 78 77 83 86  Resp: (!) 22 20 (!) 27 (!) 21  Temp:      TempSrc:      SpO2: 97% 95% 99% 99%  Height:       No intake or output data in  the 24 hours ending 07/08/24 1604    11/30/2023    1:32 PM 10/23/2023    3:36 PM 10/09/2023  10:44 AM  Last 3 Weights  Weight (lbs) 175 lb 179 lb 187 lb 4 oz  Weight (kg) 79.379 kg 81.194 kg 84.936 kg     Body mass index is 26.61 kg/m.  General: 73 y.o. male resting comfortably in no acute distress. HEENT: Normocephalic and atraumatic. Sclera clear. EOMs intact. Neck: Supple. No JVD. Heart: RRR. Distinct S1 and S2. No murmurs, gallops, or rubs. Lungs: No increased work of breathing. Clear to ausculation bilaterally. No wheezes, rhonchi, or rales.  Abdomen: Soft, non-distended, and non-tender to palpation.  Extremities: Trace lower extremity edema bilaterally. Skin: Warm and dry. Neuro:  No focal deficits. Psych: Normal affect. Responds appropriately.   EKG:  The EKG was personally reviewed and demonstrates:  Normal sinus rhythm with non-specific T wave changes.  Telemetry:  Telemetry was personally reviewed and demonstrates:  Normal sinus rhythm with rates in the 70s to 80s. Occasional PVCs and short runs of NSVT also noted (longest run 6 beats).   Relevant CV Studies:  Complete Echocardiogram 11/19/2022 ([facility]): - Left ventricular systolic function is severely reducedat 15%.  There is severe [Grade III] diastolic dysfunction [restrictive filling pattern], with elevated left  atrial pressure.  - The right ventricle is normal in size and function.  - Normal atrial sizes.  - Diffuse thickening of the aortic valve with restricted cusp opening. Hemodynamically significant  valvular aortic stenosis cannot be excluded due to elevated heart rate. Would repeat limited TTE  when heart rate is better controlled for assessment of aortic valve function.  - There is trace mitral regurgitation.  - There is trace tricuspid regurgitation.  - The pulmonic valve is normal in structure and function.  - IVC size was normal.  - There is no pericardial effusion.  _______________  Limited  Echocardiogram 11/20/2022 ([facility]): Diffuse thickening of the aortic valve with restricted cusp opening. There is no aortic  regurgitation. There is mild to moderate aortic stenosis. Aortic valve mean pressure gradient is 9.5  mmHg. The calculated aortic valve area using the continuity equation is 1.7 cm2. AS dimensionless  index  VTI  is 0.44.  _______________  Right/ Left Catheterization 12/09/2022: 1.  Continued patency of the left main 2.  Continued patency of the LAD and diagonal stents with mild diffuse in-stent restenosis in both vessels, moderate distal edge restenosis in the LAD and the diagonal 3.  Stable but severe stenosis of a small first OM branch and total occlusion of the mid left circumflex which is now collateralized via left to right collaterals filling the distal OM 4.  Continued patency of the RCA stent with mild diffuse in-stent restenosis and mild to moderate distal RCA and proximal PDA stenoses 5.  Normal LVEDP of 12 mmHg, normal wedge pressure of 10 mmHg, then well compensated hemodynamics with normal right-sided diastolic filling pressures and preserved cardiac output of 7.4 L/min with cardiac index of 3.6 L/min/m   Recommendations: Continued medical therapy  Diagnostic Dominance: Right     Laboratory Data: High Sensitivity Troponin:   Recent Labs  Lab 07/08/24 0628 07/08/24 0825  TROPONINIHS 15 18*     Chemistry Recent Labs  Lab 07/08/24 0628 07/08/24 0634 07/08/24 0835 07/08/24 1100  NA 136 138 139  --   K 4.9 4.4 4.2  --   CL 104  --   --   --   CO2 19*  --   --   --   GLUCOSE 154*  --   --   --   BUN  47*  --   --   --   CREATININE 2.71*  --   --  2.83*  CALCIUM  8.4*  --   --   --   GFRNONAA 24*  --   --  23*  ANIONGAP 13  --   --   --     No results for input(s): PROT, ALBUMIN, AST, ALT, ALKPHOS, BILITOT in the last 168 hours. Lipids No results for input(s): CHOL, TRIG, HDL, LABVLDL, LDLCALC, CHOLHDL in the last 168  hours.  Hematology Recent Labs  Lab 07/08/24 0628 07/08/24 0634 07/08/24 0835 07/08/24 1100  WBC 9.0  --   --  9.1  RBC 6.15*  --   --  5.75  HGB 13.3 16.0 13.9 12.6*  HCT 43.6 47.0 41.0 40.2  MCV 70.9*  --   --  69.9*  MCH 21.6*  --   --  21.9*  MCHC 30.5  --   --  31.3  RDW 15.4  --   --  14.8  PLT 218  --   --  179   Thyroid  No results for input(s): TSH, FREET4 in the last 168 hours.  BNP Recent Labs  Lab 07/08/24 0628  BNP 648.0*    DDimer No results for input(s): DDIMER in the last 168 hours.  Radiology/Studies:  DG Chest Port 1 View Result Date: 07/08/2024 EXAM: 1 VIEW(S) XRAY OF THE CHEST 07/08/2024 06:37:00 AM COMPARISON: 07/17/2023 CLINICAL HISTORY: Dyspnea FINDINGS: LUNGS AND PLEURA: Increased pulmonary vascularity with mild interstitial edema. No focal pulmonary opacity. No pleural effusion. No consolidative change. No pneumothorax. HEART AND MEDIASTINUM: Aortic atherosclerotic calcifications. BONES AND SOFT TISSUES: No acute osseous abnormality. IMPRESSION: 1. Pulmonary interstitial edema with increased pulmonary vascularity, mild in severity. Electronically signed by: Waddell Calk MD 07/08/2024 06:45 AM EST RP Workstation: [id number]     Assessment and Plan:  Acute on Chronic HFrEF Ischemic Cardiomyopathy Patient has a history of chronic HFrEF due to ischemic cardiomyopathy. Last Echo in 10/2022 showed LVEF of 15-20% with grade 3 diastolic dysfunction. Management has been difficult due to multiple medication intolerance and long history of medication non-compliance. He now presents with acute onset of shortness of breath initially requiring BiPAP.  BNP elevated at 648. Chest x-ray showed pulmonary interstitial edema with increase pulmonary vascularity. He was given a dose of IV Lasix  80mg  and has 1.2L of urine output so far.  - He has been weaned off BiPAP and O2 sat in the mid to high 90s on room air.  - He does not look significantly volume overloaded on  exam. Per EMS review sheet, initial systolic BP 220 so suspect he had flash pulmonary edema. - Will repeat Echo. - He currently has IV Lasix  40mg  twice daily ordered. However, may just be able to give another dose of IV Lasix  80mg  tonight (given baseline renal function he may need more than 40mg ) and then reassess tomorrow. Will discuss with MD.  - He has been intolerant to ACE/ ARB as well as Coreg  in the past. He was previously on Toprol -XL but stopped taking this as well. He has previously declined other GDMT.   CAD History of STEMI in 2016 s/p DES to 1st Diag and staged PCI with  DES to LAD and RCA (CABG was recommended but he declined this). Last LHC in 12/2022 showed patent stents with only mild in-stent restenosis and stable severe stenosis of a small OM1 and CTO of distal LCX with collaterals. Continued medical therapy was recommended at that  time. EKG on admission showed no acute ischemic changes. High sensitivity troponin 15 >> 18.   - No chest pain. - Continue Plavix  75mg  daily.  - Previously on Crestor  but patient stopped taking this on his own.   Hypertension Systolic BP initially 220 in the field. He received 2 doses of sublingual Nitro in the field. - BP mostly well controlled in the ED now. - Continue to monitor with diuresis.   Hyperlipidemia LDL 92 in 08/2023. LDL goal <70 given CAD.  - Previously on Crestor  10mg  daily but he stopped taking this.   Type 2 Diabetes Mellitus Hemoglobin A1c 6.4% in 12/2023. - Management per primary team.   CKD Stage IV Creatinine 2.71 on admission. Recent baseline around 2.2 to 2.4.  - Continue to monitor closely with diuresis.  - Recommend referral to Nephrology at discharge.  Medication Non-Compliance Patient has a long history of medication non-compliance and multiple medication intolerances. He has stopped medications on his own several times in the past. He has previously declined usual guideline therapy and has only been taking Plavix   and Synthroid . This complicates treatment of above conditions.   Risk Assessment/Risk Scores:   New [place]  Heart Association (NYHA) Functional Class NYHA Class IV on presentation in setting of suspected flash pulmonary edema. NYHA Class II at baseline.   For questions or updates, please contact Nicolas Ray Please consult [email or web address] for contact info under   Signed, Nicolas E Goodrich, PA-C  07/08/2024 4:04 PM  Patient seen, examined. Available data reviewed. Agree with findings, assessment, and plan as outlined by Nicolas Door, PA-C.  Patient is independently interviewed and examined.  HEENT normal, JVP normal, lungs clear to auscultation bilaterally, heart regular rate and rhythm no murmur or gallop, abdomen soft and nontender no organomegaly, extremities with chronic stasis changes in the lower legs and trace bilateral ankle edema, skin is warm and dry.  Patient is very well-known to me.  He has advanced heart failure with LVEF less than 20% with restrictive filling pattern.  He has longstanding medication noncompliance and has attributed all kinds of symptoms to medication side effects.  I have never been able to get him on any GDMT as he has consistently stopped taking his medications for a variety of reasons.  He now presents with recurrent acute pulmonary edema which occurred last about 18 months ago.  He otherwise reports that he has been doing fine with no complaints.  He has responded quickly to IV diuretic therapy.  Treatment is limited by his noncompliance and the presence of stage IV chronic kidney disease, creatinine today 2.83 which is not far off of his baseline.  Agree with continuation of IV diuresis overnight, then transition him to an oral loop diuretic tomorrow.  He continues on clopidogrel  which I think is the only medication he has been taking consistently.  Would consider trying furosemide  80 mg daily to prevent recurrent pulmonary edema.  Agree with updated 2D echo  in our system.  I reviewed his previous right and left heart catheterization study from last year when he was shown to have well compensated hemodynamics and stable coronary artery disease with recommendations for ongoing medical therapy.  The patient has no signs of ACS on this admission.  I told the patient I would be happy to see him back in outpatient follow-up within 2 to 3 weeks of hospital discharge.  I suspect he will be medically stable for discharge tomorrow as he seems markedly improved from his presentation here.  Otherwise, as outlined above.  Nicolas Ray, M.D. 07/08/2024 7:07 PM

## 2024-07-08 NOTE — Progress Notes (Addendum)
 PHARMACY - ANTICOAGULATION CONSULT NOTE  Pharmacy Consult for heparin  Indication: chest pain/ACS  Allergies  Allergen Reactions   Aluminum-Containing Compounds Other (See Comments)    Makes hands feel weak     Patient Measurements: Height: 5' 8 (172.7 cm) Weight: 86.2 kg (190 lb) IBW/kg (Calculated) : 68.4 HEPARIN  DW (KG): 85.7  Vital Signs: Temp: 98.1 F (36.7 C) (11/07 2028) Temp Source: Oral (11/07 2028) BP: 138/89 (11/07 2210) Pulse Rate: 106 (11/07 2210)  Labs: Recent Labs    07/08/24 0628 07/08/24 0634 07/08/24 0825 07/08/24 0835 07/08/24 1100 07/08/24 2253  HGB 13.3   < >  --  13.9 12.6* 15.3  HCT 43.6   < >  --  41.0 40.2 45.0  PLT 218  --   --   --  179  --   CREATININE 2.71*  --   --   --  2.83*  --   TROPONINIHS 15  --  18*  --   --   --    < > = values in this interval not displayed.    Estimated Creatinine Clearance: 24.8 mL/min (A) (by C-G formula based on SCr of 2.83 mg/dL (H)).   Medical History: Past Medical History:  Diagnosis Date   Acute MI, lateral wall, initial episode of care (HCC) 04/18/2015   CAD (coronary artery disease) 04/18/2015   a. Lat STEMI 8/16 - LHC:  pLAD 90, mLAD 50, D1 100, dLCx 90, OM1 80, pRCA 75, EF 35-45% >> DES to D1;  b. staged PCI 04/20/15 - DES to mid LAD and DES to prox RCA   Depression    DM2 (diabetes mellitus, type 2) (HCC)    Hypothyroidism    Ischemic cardiomyopathy    a. Echo 8/16:  Mild LVH, EF 40-45%, mid-apical anterolateral and apical AK, grade 1 diastolic dysfunction, trivial effusion   OSA (obstructive sleep apnea) 09/21/2018   ST elevation myocardial infarction (STEMI) of lateral wall (HCC) 04/18/2015   Assessment: 56 yoM presented with SOB/HF exacerbation. PMH includes: HFrEF (LVEF 15-20% in 2024), CAD (staged PCI in 2016), HTN, HLD, T2DM, CKD stage IV, OSA, tobacco use. Pharmacy consulted to dose heparin  for ACS.  -PM: patient found unresponsive/ diaphoretic with no pulse >> Vfib > defibrillated  > ROSC -No oral anticoagulation PTA -Hgb 12.6, plts 179 -Trops 18 -lovenox ordered for DVT ppx, not given  Goal of Therapy:  Heparin  level 0.3-0.7 units/ml Monitor platelets by anticoagulation protocol: Yes   Plan:  Give 4000 units bolus x 1 Start heparin  infusion at 1100 units/hr Check anti-Xa level in 8 hours and daily while on heparin  Continue to monitor H&H and platelets  Lynwood Poplar, PharmD, BCPS Clinical Pharmacist 07/08/2024 11:36 PM

## 2024-07-08 NOTE — ED Notes (Addendum)
 Assuming pt care, pt bib ems for sob x few hrs prior to arrival, pt

## 2024-07-08 NOTE — ED Notes (Signed)
 Pt. Sats were in low 90s; placed 2L [place] on

## 2024-07-08 NOTE — ED Notes (Signed)
 RT called to reassess need for bipap. RR elevated, spo2 WDL.

## 2024-07-08 NOTE — Progress Notes (Signed)
   07/08/24 0628  BiPAP/CPAP/SIPAP  BiPAP/CPAP/SIPAP Pt Type Adult  BiPAP/CPAP/SIPAP SERVO  Mask Type Full face mask  Mask Size Medium  Set Rate 12 breaths/min  Respiratory Rate 30 breaths/min  IPAP 15 cmH20  EPAP 6 cmH2O  PEEP 6 cmH20  FiO2 (%) 60 %  Minute Ventilation 22.3  Leak 75  Peak Inspiratory Pressure (PIP) 15  Tidal Volume (Vt) 531  Patient Home Machine No  Patient Home Mask No  Patient Home Tubing No  Device Plugged into RED Power Outlet Yes  BiPAP/CPAP /SiPAP Vitals  Bilateral Breath Sounds Diminished

## 2024-07-08 NOTE — Progress Notes (Signed)
 Pt moved from ED room 43 to RESUS on the ventilator w/o any complications

## 2024-07-08 NOTE — Code Documentation (Signed)
 Patient intubated-25 at the lip

## 2024-07-08 NOTE — Code Documentation (Signed)
 CPR started.

## 2024-07-08 NOTE — ED Triage Notes (Signed)
 Patient BIB [facility] from home due to SOB. Patient reports increased SOB x 3hours. Patient has hx of CHF per EMS but does not take diuretics. Patient arrived on CPAP. No complaints of pain.

## 2024-07-08 NOTE — Code Documentation (Signed)
Pulse check pulseless vfib

## 2024-07-08 NOTE — ED Notes (Signed)
 Called CCMD

## 2024-07-08 NOTE — Code Documentation (Addendum)
 200J-shock delivered  ROSC

## 2024-07-08 NOTE — Progress Notes (Signed)
   07/08/24 1938  BiPAP/CPAP/SIPAP  BiPAP/CPAP/SIPAP Pt Type Adult  Reason BIPAP/CPAP not in use Non-compliant (pt refused)  BiPAP/CPAP /SiPAP Vitals  Pulse Rate 93  Resp 18  SpO2 100 %  Bilateral Breath Sounds Diminished

## 2024-07-08 NOTE — ED Notes (Signed)
 Called and updated sister Wieber)

## 2024-07-08 NOTE — ED Provider Notes (Signed)
 I saw and evaluated the patient, reviewed the resident's note and I agree with the findings and plan.      73 y/o male hx HFrEF here w/ resp distress CPAP pta, now on BIPAP > stable Pulm edema on imaging, BNP elev, overloaded on exam > diuresing Cr similar to prior pH slightly improved, mental status is very stable, pt subjectively feeling much better > continue BIPAP for now, recheck gas Admit hospitalist   CRITICAL CARE Performed by: Jayson DELENA Pereyra   Total critical care time: 30 minutes  Critical care time was exclusive of separately billable procedures and treating other patients.  Critical care was necessary to treat or prevent imminent or life-threatening deterioration.  Critical care was time spent personally by me on the following activities: development of treatment plan with patient and/or surrogate as well as nursing, discussions with consultants, evaluation of patient's response to treatment, examination of patient, obtaining history from patient or surrogate, ordering and performing treatments and interventions, ordering and review of laboratory studies, ordering and review of radiographic studies, pulse oximetry and re-evaluation of patient's condition. Acute chf, resp fail    Kinser, Fellman, DO 07/08/24 9077

## 2024-07-08 NOTE — ED Notes (Signed)
 Trialing off bipap per provider. Pt WOB normal, O2 sats 100% on room air.

## 2024-07-08 NOTE — ED Provider Notes (Signed)
   I was called to bedside of this patient that was already admitted.  Responded to code.  When I walked in the room, patient had active CPR.  Had just been given 1 round of epinephrine .  Was pulseless.   Subsequently continued CPR while zoll was attached.  Patient was being bagged at this time.  First pulse check when I was in the room showed patient to be pulseless.  V-fib was noted on monitor.  Pulseless V-fib.  Patient was shocked unsynchronized x 1 with 200 J.  Subsequently converted back to sinus rhythm with positive pulse and blood pressure.  Started patient on Amio bolus followed by amnio drip.  Subsequently intubated the patient with etomidate and rocuronium .  It had x 2 because of pretty significant secretions in the airway that had to suction.  Tolerated procedure well.  No drops in maps.  Repeated chest x-ray which showed ET tube in place.  Reviewed cardiac telemetry.  Looks like he probably went to torsades before going into arrest.  Lavanda Lesches, PA was in the room as well.  She spoke to both ICU as well as cardiology regarding the patient.  Cardiology will see the patient.  ICU will come see the patient as well.  Performed bedside echo.  Looks like EF of about 30 to 35%.   Reviewed laboratory workup.  No magnesium  in the system.  Will give 2 g of magnesium  IV and obtain magnesium  level.    I personally spoke to ICU and cards team.   Procedure Name: Intubation Date/Time: 07/08/2024 10:32 PM  Performed by: Simon Lavonia SAILOR, MDPre-anesthesia Checklist: Patient identified and Suction available Oxygen Delivery Method: Ambu bag Preoxygenation: Pre-oxygenation with 100% oxygen Induction Type: Rapid sequence Ventilation: Mask ventilation without difficulty Laryngoscope Size: Glidescope Grade View: Grade I Tube size: 7.5 mm Number of attempts: 2 Placement Confirmation: ETT inserted through vocal cords under direct vision, Positive ETCO2, CO2 detector and Breath sounds checked-  equal and bilateral Secured at: 24 cm Tube secured with: ETT holder      CRITICAL CARE Performed by: Lavonia SAILOR Simon   Total critical care time: 55 minutes  Critical care time was exclusive of separately billable procedures and treating other patients.  Critical care was necessary to treat or prevent imminent or life-threatening deterioration.  Critical care was time spent personally by me on the following activities: development of treatment plan with patient and/or surrogate as well as nursing, discussions with consultants, evaluation of patient's response to treatment, examination of patient, obtaining history from patient or surrogate, ordering and performing treatments and interventions, ordering and review of laboratory studies, ordering and review of radiographic studies, pulse oximetry and re-evaluation of patient's condition.     Simon Lavonia SAILOR, MD 07/08/24 2233

## 2024-07-08 NOTE — Progress Notes (Signed)
 Cardiology follow-up note for critical event Critical care note  VF arrest status post defibrillator and intubation and sedation  Patient has been admitted for acute on chronic CHF, HFrEF EF 15 to 20%, ischemic cardiomyopathy, has been CAD multivessel status post PCI to LAD, RCA, with residual moderate disease and CTO of mid LCx with left-to-right collaterals, noncompliant with medication, tobacco use disorder, multiple other comorbidities came in with acute heart failure and pulmonary edema today  He was apparently doing fine, diuresing, was on telemetry and today around 8:00 past 8:00 he went into what appears to be torsades, he does have R-on-T phenomenon on telemetry and noted to be in V-fib status post 2 rounds of CPR, epinephrine , then shock, IV amiodarone, IV magnesium  was given. Was in the ER and was resuscitated immediately without any significant downtime,  Cardiology was consulted due to acute EKG changes and VF arrest  Patient did not complain of any chest pain on admission. His vitals were stable, went into torsades and then V-fib status post defibrillation. Postarrest EKG shows Q waves in inferior and lateral leads with ST-T wave changes which are concerning.  First EKG looks like Square sign in inferior and lateral leads with J-point elevation and subsequent EKG showed slightly different from before but still had some J-point elevation and right bundle branch block which was not there before. Repeat EKG showed resolution of this J-point elevation but persistent Q waves in inferior and lateral leads.   We looked at his coronary anatomy from last cath 2024, he has patent left main, LAD and diagonal stents with diffuse in-stent renal stenosis in both vessel and moderate distal edge of the stent has restenosis and diagonal disease,, OM1 small and severe disease, CTO of mid LCx with left-to-right collaterals, patent RCA stent with moderate distal RCA and proximal PDA disease, 2024 his EF  was 1520% but cardiac index was preserved and compensated filling pressures. Patient has longstanding history of noncompliant and intolerant to medication  Patient was evaluated by me at bedside, also Dr. Anner was present and evaluated the patient and reviewed his prior EKG, cath films and also current EKG and his hospitalization.  Please note patient came in with heart failure exacerbation did not have chest pain initial troponin was very minimally elevated at 18, EKG was normal sinus rhythm with prior anteroseptal infarct  Assessment and plan 1.  Torsades/VF arrest status post defibrillation.  Postarrest EKG shows Q waves in inferior and lateral leads with ST-T wave changes which has improved 2.  Intubation and sedation for cardiac arrest 3.  Acute on chronic ischemic cardiomyopathy EF 15 to 20%   Plan: -> I will start him on heparin  GTT, continue to monitor his troponin, continue Plavix  and will give him IV Lasix  tomorrow, he did get Lasix  before. Keep potassium close to 4, magnesium  more than 2, he did get IV magnesium  after arrest. He will need cardiac cath at some point but not acutely given his ST-T wave changes improved, we will continue to monitor him closely. He is currently admitted under critical care service intubated and sedated and management of ventilation per them.   CRITICAL CARE TIME Performed by: Dr Anner, MD   Total critical care time: 35 minutes  Critical care time was exclusive of separately billable procedures and treating other patients.  Critical care was necessary to treat or prevent imminent or life-threatening deterioration.  Critical care was time spent personally by me on the following activities: development of treatment plan with  patient and/or surrogate as well as nursing, discussions with consultants, evaluation of patient's response to treatment, examination of patient, obtaining history from patient or surrogate, ordering and performing  treatments and interventions, ordering and review of laboratory studies, ordering and review of radiographic studies, pulse oximetry and re-evaluation of patient's condition.  Grayce Bold

## 2024-07-08 NOTE — Progress Notes (Signed)
 Pt transported from RESUS to 2H25 on the ventilator w/o any complications.

## 2024-07-08 NOTE — ED Notes (Signed)
 CN received call from CCMD, requesting the pt be checked b/c it appears they are in vtac.  CN went to the room immediately and found the pt sitting in the recliner unresponsive w/ no pulse.  RN initiated code blue and w/ help of two other staff members moved the pt to the bed, b/c of no pulses CPR was started.

## 2024-07-09 ENCOUNTER — Inpatient Hospital Stay (HOSPITAL_COMMUNITY)

## 2024-07-09 ENCOUNTER — Encounter (HOSPITAL_COMMUNITY): Payer: Self-pay | Admitting: Pulmonary Disease

## 2024-07-09 DIAGNOSIS — I4901 Ventricular fibrillation: Secondary | ICD-10-CM | POA: Diagnosis not present

## 2024-07-09 DIAGNOSIS — N189 Chronic kidney disease, unspecified: Secondary | ICD-10-CM | POA: Diagnosis not present

## 2024-07-09 DIAGNOSIS — I255 Ischemic cardiomyopathy: Secondary | ICD-10-CM | POA: Diagnosis not present

## 2024-07-09 DIAGNOSIS — I5021 Acute systolic (congestive) heart failure: Secondary | ICD-10-CM | POA: Diagnosis not present

## 2024-07-09 DIAGNOSIS — Z9911 Dependence on respirator [ventilator] status: Secondary | ICD-10-CM

## 2024-07-09 DIAGNOSIS — I2510 Atherosclerotic heart disease of native coronary artery without angina pectoris: Secondary | ICD-10-CM | POA: Diagnosis not present

## 2024-07-09 DIAGNOSIS — I5020 Unspecified systolic (congestive) heart failure: Secondary | ICD-10-CM | POA: Diagnosis not present

## 2024-07-09 DIAGNOSIS — R570 Cardiogenic shock: Secondary | ICD-10-CM | POA: Diagnosis not present

## 2024-07-09 DIAGNOSIS — J9601 Acute respiratory failure with hypoxia: Secondary | ICD-10-CM

## 2024-07-09 DIAGNOSIS — I5023 Acute on chronic systolic (congestive) heart failure: Secondary | ICD-10-CM

## 2024-07-09 DIAGNOSIS — I469 Cardiac arrest, cause unspecified: Secondary | ICD-10-CM | POA: Diagnosis not present

## 2024-07-09 DIAGNOSIS — N179 Acute kidney failure, unspecified: Secondary | ICD-10-CM | POA: Diagnosis not present

## 2024-07-09 LAB — BASIC METABOLIC PANEL WITH GFR
Anion gap: 16 — ABNORMAL HIGH (ref 5–15)
BUN: 51 mg/dL — ABNORMAL HIGH (ref 8–23)
CO2: 19 mmol/L — ABNORMAL LOW (ref 22–32)
Calcium: 8.8 mg/dL — ABNORMAL LOW (ref 8.9–10.3)
Chloride: 100 mmol/L (ref 98–111)
Creatinine, Ser: 3.06 mg/dL — ABNORMAL HIGH (ref 0.61–1.24)
GFR, Estimated: 21 mL/min — ABNORMAL LOW (ref >60)
Glucose, Bld: 223 mg/dL — ABNORMAL HIGH (ref 70–99)
Potassium: 4.4 mmol/L (ref 3.5–5.1)
Sodium: 135 mmol/L (ref 135–145)

## 2024-07-09 LAB — POCT I-STAT 7, (LYTES, BLD GAS, ICA,H+H)
Acid-base deficit: 5 mmol/L — ABNORMAL HIGH (ref 0.0–2.0)
Bicarbonate: 20.1 mmol/L (ref 20.0–28.0)
Calcium, Ion: 1.18 mmol/L (ref 1.15–1.40)
HCT: 41 % (ref 39.0–52.0)
Hemoglobin: 13.9 g/dL (ref 13.0–17.0)
O2 Saturation: 98 %
Potassium: 4.7 mmol/L (ref 3.5–5.1)
Sodium: 134 mmol/L — ABNORMAL LOW (ref 135–145)
TCO2: 21 mmol/L — ABNORMAL LOW (ref 22–32)
pCO2 arterial: 38 mmHg (ref 32–48)
pH, Arterial: 7.331 — ABNORMAL LOW (ref 7.35–7.45)
pO2, Arterial: 113 mmHg — ABNORMAL HIGH (ref 83–108)

## 2024-07-09 LAB — TROPONIN I (HIGH SENSITIVITY)
Troponin I (High Sensitivity): 355 ng/L (ref <18)
Troponin I (High Sensitivity): 1888 ng/L (ref <18)
Troponin I (High Sensitivity): 2227 ng/L (ref <18)

## 2024-07-09 LAB — ECHOCARDIOGRAM COMPLETE
AR max vel: 1.38 cm2
AV Area VTI: 1.34 cm2
AV Area mean vel: 1.39 cm2
AV Mean grad: 6 mmHg
AV Peak grad: 12.7 mmHg
Ao pk vel: 1.78 m/s
Area-P 1/2: 3.6 cm2
Calc EF: 20.8 %
Height: 68 in
S' Lateral: 5.6 cm
Single Plane A2C EF: 19.5 %
Single Plane A4C EF: 19.2 %
Weight: 2998.26 [oz_av]

## 2024-07-09 LAB — MRSA NEXT GEN BY PCR, NASAL: MRSA by PCR Next Gen: NOT DETECTED

## 2024-07-09 LAB — HEPARIN LEVEL (UNFRACTIONATED)
Heparin Unfractionated: 0.1 [IU]/mL — ABNORMAL LOW (ref 0.30–0.70)
Heparin Unfractionated: 0.18 [IU]/mL — ABNORMAL LOW (ref 0.30–0.70)

## 2024-07-09 LAB — GLUCOSE, CAPILLARY
Glucose-Capillary: 129 mg/dL — ABNORMAL HIGH (ref 70–99)
Glucose-Capillary: 140 mg/dL — ABNORMAL HIGH (ref 70–99)
Glucose-Capillary: 148 mg/dL — ABNORMAL HIGH (ref 70–99)
Glucose-Capillary: 165 mg/dL — ABNORMAL HIGH (ref 70–99)
Glucose-Capillary: 170 mg/dL — ABNORMAL HIGH (ref 70–99)
Glucose-Capillary: 214 mg/dL — ABNORMAL HIGH (ref 70–99)

## 2024-07-09 LAB — MAGNESIUM: Magnesium: 3 mg/dL — ABNORMAL HIGH (ref 1.7–2.4)

## 2024-07-09 LAB — TRIGLYCERIDES: Triglycerides: 60 mg/dL (ref <150)

## 2024-07-09 MED ORDER — ARFORMOTEROL TARTRATE 15 MCG/2ML IN NEBU
15.0000 ug | INHALATION_SOLUTION | Freq: Two times a day (BID) | RESPIRATORY_TRACT | Status: DC
  Administered 2024-07-09 – 2024-07-10 (×3): 15 ug via RESPIRATORY_TRACT
  Filled 2024-07-09 (×4): qty 2

## 2024-07-09 MED ORDER — REVEFENACIN 175 MCG/3ML IN SOLN
175.0000 ug | Freq: Every day | RESPIRATORY_TRACT | Status: DC
  Administered 2024-07-09 – 2024-07-10 (×2): 175 ug via RESPIRATORY_TRACT
  Filled 2024-07-09 (×3): qty 3

## 2024-07-09 MED ORDER — PERFLUTREN LIPID MICROSPHERE
1.0000 mL | INTRAVENOUS | Status: AC | PRN
  Administered 2024-07-09: 3 mL via INTRAVENOUS

## 2024-07-09 MED ORDER — CLEVIDIPINE BUTYRATE 0.5 MG/ML IV EMUL: 0.0000 mg/h | INTRAVENOUS | Status: DC

## 2024-07-09 MED ORDER — INSULIN ASPART 100 UNIT/ML IJ SOLN
0.0000 [IU] | INTRAMUSCULAR | Status: DC
  Administered 2024-07-09 – 2024-07-10 (×5): 2 [IU] via SUBCUTANEOUS
  Administered 2024-07-10 – 2024-07-11 (×4): 3 [IU] via SUBCUTANEOUS
  Filled 2024-07-09 (×4): qty 3
  Filled 2024-07-09 (×5): qty 2

## 2024-07-09 MED ORDER — FUROSEMIDE 10 MG/ML IJ SOLN
80.0000 mg | INTRAMUSCULAR | Status: AC
  Administered 2024-07-09 (×2): 80 mg via INTRAVENOUS
  Filled 2024-07-09 (×2): qty 8

## 2024-07-09 MED ORDER — DOCUSATE SODIUM 50 MG/5ML PO LIQD
100.0000 mg | Freq: Two times a day (BID) | ORAL | Status: DC
  Administered 2024-07-09 (×2): 100 mg
  Filled 2024-07-09 (×3): qty 10

## 2024-07-09 MED ORDER — POLYETHYLENE GLYCOL 3350 17 G PO PACK
17.0000 g | PACK | Freq: Every day | ORAL | Status: DC
  Administered 2024-07-09: 17 g
  Filled 2024-07-09 (×2): qty 1

## 2024-07-09 MED ORDER — FENTANYL BOLUS VIA INFUSION
25.0000 ug | INTRAVENOUS | Status: DC | PRN
  Administered 2024-07-09 (×2): 25 ug via INTRAVENOUS
  Administered 2024-07-09 (×3): 50 ug via INTRAVENOUS
  Administered 2024-07-09: 25 ug via INTRAVENOUS
  Administered 2024-07-09: 75 ug via INTRAVENOUS
  Administered 2024-07-09: 25 ug via INTRAVENOUS
  Administered 2024-07-10: 100 ug via INTRAVENOUS
  Administered 2024-07-10: 75 ug via INTRAVENOUS

## 2024-07-09 MED ORDER — ORAL CARE MOUTH RINSE
15.0000 mL | OROMUCOSAL | Status: DC
  Administered 2024-07-09 – 2024-07-10 (×11): 15 mL via OROMUCOSAL

## 2024-07-09 MED ORDER — FENTANYL CITRATE (PF) 50 MCG/ML IJ SOSY
25.0000 ug | PREFILLED_SYRINGE | Freq: Once | INTRAMUSCULAR | Status: AC
  Administered 2024-07-09: 50 ug via INTRAVENOUS
  Filled 2024-07-09: qty 1

## 2024-07-09 MED ORDER — FENTANYL 2500MCG IN NS 250ML (10MCG/ML) PREMIX INFUSION
0.0000 ug/h | INTRAVENOUS | Status: DC
  Administered 2024-07-09: 50 ug/h via INTRAVENOUS
  Filled 2024-07-09: qty 250

## 2024-07-09 MED ORDER — ORAL CARE MOUTH RINSE: 15.0000 mL | OROMUCOSAL | Status: DC | PRN

## 2024-07-09 MED ORDER — LEVOTHYROXINE SODIUM 75 MCG PO TABS
150.0000 ug | ORAL_TABLET | Freq: Every day | ORAL | Status: DC
  Administered 2024-07-09 – 2024-07-11 (×3): 150 ug
  Filled 2024-07-09 (×3): qty 2

## 2024-07-09 NOTE — Progress Notes (Signed)
 PHARMACY - ANTICOAGULATION CONSULT NOTE  Pharmacy Consult for heparin  Indication: chest pain/ACS  Allergies  Allergen Reactions   Aluminum-Containing Compounds Other (See Comments)    Makes hands feel weak     Patient Measurements: Height: 5' 8 (172.7 cm) Weight: 85 kg (187 lb 6.3 oz) IBW/kg (Calculated) : 68.4 HEPARIN  DW (KG): 85.7  Vital Signs: Temp: 99.7 F (37.6 C) (11/08 1226) Temp Source: Oral (11/08 1226) BP: 99/51 (11/08 1430) Pulse Rate: 64 (11/08 1430)  Labs: Recent Labs    07/08/24 0628 07/08/24 0634 07/08/24 1100 07/08/24 2253 07/09/24 0159 07/09/24 0200 07/09/24 0509 07/09/24 0653 07/09/24 0844 07/09/24 0937  HGB 13.3   < > 12.6* 15.3  --   --  13.9  --   --   --   HCT 43.6   < > 40.2 45.0  --   --  41.0  --   --   --   PLT 218  --  179  --   --   --   --   --   --   --   HEPARINUNFRC  --   --   --   --   --   --   --   --   --  0.10*  CREATININE 2.71*  --  2.83*  --   --  3.06*  --   --   --   --   TROPONINIHS 15   < >  --   --  355*  --   --  1,888* 2,227*  --    < > = values in this interval not displayed.    Estimated Creatinine Clearance: 22.8 mL/min (A) (by C-G formula based on SCr of 3.06 mg/dL (H)).   Medical History: Past Medical History:  Diagnosis Date   Acute MI, lateral wall, initial episode of care (HCC) 04/18/2015   CAD (coronary artery disease) 04/18/2015   a. Lat STEMI 8/16 - LHC:  pLAD 90, mLAD 50, D1 100, dLCx 90, OM1 80, pRCA 75, EF 35-45% >> DES to D1;  b. staged PCI 04/20/15 - DES to mid LAD and DES to prox RCA   Depression    DM2 (diabetes mellitus, type 2) (HCC)    Hypothyroidism    Ischemic cardiomyopathy    a. Echo 8/16:  Mild LVH, EF 40-45%, mid-apical anterolateral and apical AK, grade 1 diastolic dysfunction, trivial effusion   OSA (obstructive sleep apnea) 09/21/2018   ST elevation myocardial infarction (STEMI) of lateral wall (HCC) 04/18/2015   Assessment: 11 yoM presented with SOB/HF exacerbation. PMH  includes: HFrEF (LVEF 15-20% in 2024), CAD (staged PCI in 2016), HTN, HLD, T2DM, CKD stage IV, OSA, tobacco use. Pharmacy consulted to dose heparin  for ACS.  -PM: patient found unresponsive/ diaphoretic with no pulse >> Vfib > defibrillated > ROSC -No oral anticoagulation PTA -Hgb 12.6, plts 179 -Trops 18 -lovenox ordered for DVT ppx, not given  Heparin  drip 1100 uts/hr with heparin  level 0.1 < goal  Cbc stable no bleeding   Goal of Therapy:  Heparin  level 0.3-0.7 units/ml Monitor platelets by anticoagulation protocol: Yes   Plan:  Increase Heparin  drip 1300 uts/hr  Heparin  level in 6hr after rate increase  Daily heparin  level and CBC Monitor s/s bleeding    Olam Chalk Pharm.D. CPP, BCPS Clinical Pharmacist [phone] 07/09/2024 2:41 PM

## 2024-07-09 NOTE — Progress Notes (Signed)
 Rounding Note   Patient Name: Nicolas Ray Date of Encounter: 07/09/2024  [facility] Cardiologist: Ozell Fell, MD   Subjective Pt intubated   Sedated   Scheduled Meds:  Chlorhexidine  Gluconate Cloth  6 each Topical Daily   clopidogrel   75 mg Oral Daily   docusate  100 mg Per Tube BID   furosemide   40 mg Intravenous BID   insulin  aspart  0-15 Units Subcutaneous Q4H   levothyroxine   150 mcg Per Tube Q0600   nicotine  14 mg Transdermal Daily   mouth rinse  15 mL Mouth Rinse Q2H   polyethylene glycol  17 g Per Tube Daily   Continuous Infusions:  amiodarone 30 mg/hr (07/09/24 1000)   fentaNYL  infusion INTRAVENOUS 50 mcg/hr (07/09/24 1000)   heparin  1,100 Units/hr (07/09/24 1000)   norepinephrine  (LEVOPHED ) Adult infusion 8 mcg/min (07/09/24 1000)   propofol  (DIPRIVAN ) infusion 20 mcg/kg/min (07/09/24 1000)   PRN Meds: docusate sodium , fentaNYL , ipratropium-albuterol, mouth rinse, polyethylene glycol   Vital Signs  Vitals:   07/09/24 1015 07/09/24 1030 07/09/24 1045 07/09/24 1055  BP: 107/65 100/62 125/73   Pulse: 65 65 79   Resp: 19 (!) 24 (!) 25   Temp:    99.7 F (37.6 C)  TempSrc:    Oral  SpO2: 99% 98% 100%   Weight:      Height:        Intake/Output Summary (Last 24 hours) at 07/09/2024 1128 Last data filed at 07/09/2024 1000 Gross per 24 hour  Intake 1021.62 ml  Output 475 ml  Net 546.62 ml      07/09/2024    4:36 AM 07/08/2024   11:36 PM 07/08/2024    7:52 PM  Last 3 Weights  Weight (lbs) 187 lb 6.3 oz 187 lb 6.3 oz 190 lb  Weight (kg) 85 kg 85 kg 86.183 kg      Telemetry VT earlier  now SR   - Personally Reviewed  ECG   NSR 64  First degree AV block  PR 226  Anterior MI   T wae inversion  V2 to V4, II, III, AVF, I  Biphasic in V5 - Personally Reviewed Changes new from before yesterday  Physical Exam  GEN:Pt intubated, sedated  Neck: Unable to assess JVP   Cardiac: RRR, no murmurs, Respiratory: Clear to auscultation  bilaterally. GI: Soft, nontender, non-distended  MS: Triv LE  edema;   Labs High Sensitivity Troponin:   Recent Labs  Lab 07/08/24 0628 07/08/24 0825 07/09/24 0159 07/09/24 0653 07/09/24 0844  TROPONINIHS 15 18* 355* 1,888* 2,227*     Chemistry Recent Labs  Lab 07/08/24 0628 07/08/24 0634 07/08/24 0835 07/08/24 1100 07/08/24 2253 07/09/24 0159 07/09/24 0200  NA 136   < > 139  --  136  --  135  K 4.9   < > 4.2  --  3.7  --  4.4  CL 104  --   --   --   --   --  100  CO2 19*  --   --   --   --   --  19*  GLUCOSE 154*  --   --   --   --   --  223*  BUN 47*  --   --   --   --   --  51*  CREATININE 2.71*  --   --  2.83*  --   --  3.06*  CALCIUM  8.4*  --   --   --   --   --  8.8*  MG  --   --   --   --   --  3.0*  --   GFRNONAA 24*  --   --  23*  --   --  21*  ANIONGAP 13  --   --   --   --   --  16*   < > = values in this interval not displayed.    Lipids  Recent Labs  Lab 07/09/24 0159  TRIG 60    Hematology Recent Labs  Lab 07/08/24 0628 07/08/24 0634 07/08/24 0835 07/08/24 1100 07/08/24 2253  WBC 9.0  --   --  9.1  --   RBC 6.15*  --   --  5.75  --   HGB 13.3   < > 13.9 12.6* 15.3  HCT 43.6   < > 41.0 40.2 45.0  MCV 70.9*  --   --  69.9*  --   MCH 21.6*  --   --  21.9*  --   MCHC 30.5  --   --  31.3  --   RDW 15.4  --   --  14.8  --   PLT 218  --   --  179  --    < > = values in this interval not displayed.   Thyroid   Recent Labs  Lab 07/08/24 2014  TSH 2.112    BNP Recent Labs  Lab 07/08/24 0628  BNP 648.0*    DDimer No results for input(s): DDIMER in the last 168 hours.   Radiology  DG Abd 1 View Result Date: 07/09/2024 EXAM: 1 VIEW XRAY OF THE ABDOMEN 07/09/2024 02:44:00 AM COMPARISON: None available. CLINICAL HISTORY: Encounter for imaging study to confirm orogastric (OG) tube placement. FINDINGS: LINES, TUBES AND DEVICES: Enteric tube in place with distal tip terminating within the expected location of the gastric antrum. BOWEL:  Nonobstructive bowel gas pattern. BONES: No acute osseous abnormality. IMPRESSION: 1. Orogastric tube appropriately positioned with distal tip in the gastric antrum. Electronically signed by: Oneil Devonshire MD 07/09/2024 03:05 AM EST RP Workstation: [id number]   DG Chest Portable 1 View Result Date: 07/08/2024 EXAM: 1 VIEW(S) XRAY OF THE CHEST 07/08/2024 10:12:00 PM COMPARISON: 07/08/2024 CLINICAL HISTORY: post intubation FINDINGS: LINES, TUBES AND DEVICES: Endotracheal tube in place with tip 3.3 cm above the carina. LUNGS AND PLEURA: Mild increased interstitial markings. No focal pulmonary opacity. No pneumothorax. HEART AND MEDIASTINUM: Aortic calcification. BONES AND SOFT TISSUES: No acute osseous abnormality. IMPRESSION: 1. Endotracheal tube appropriately positioned with tip 3.3 cm above the carina. 2. Mild increased interstitial markings. Electronically signed by: Oneil Devonshire MD 07/08/2024 10:25 PM EST RP Workstation: [id number]   DG Chest Port 1 View Result Date: 07/08/2024 EXAM: 1 VIEW(S) XRAY OF THE CHEST 07/08/2024 06:37:00 AM COMPARISON: 07/17/2023 CLINICAL HISTORY: Dyspnea FINDINGS: LUNGS AND PLEURA: Increased pulmonary vascularity with mild interstitial edema. No focal pulmonary opacity. No pleural effusion. No consolidative change. No pneumothorax. HEART AND MEDIASTINUM: Aortic atherosclerotic calcifications. BONES AND SOFT TISSUES: No acute osseous abnormality. IMPRESSION: 1. Pulmonary interstitial edema with increased pulmonary vascularity, mild in severity. Electronically signed by: Waddell Calk MD 07/08/2024 06:45 AM EST RP Workstation: [id number]    Cardiac Studies Complete Echocardiogram 11/19/2022 ([facility]): - Left ventricular systolic function is severely reducedat 15%.  There is severe [Grade III] diastolic dysfunction [restrictive filling pattern], with elevated left  atrial pressure.  - The right ventricle is normal in size and function.  - Normal atrial sizes.  - Diffuse  thickening of  the aortic valve with restricted cusp opening. Hemodynamically significant  valvular aortic stenosis cannot be excluded due to elevated heart rate. Would repeat limited TTE  when heart rate is better controlled for assessment of aortic valve function.  - There is trace mitral regurgitation.  - There is trace tricuspid regurgitation.  - The pulmonic valve is normal in structure and function.  - IVC size was normal.  - There is no pericardial effusion.  _______________   Limited Echocardiogram 11/20/2022 ([facility]): Diffuse thickening of the aortic valve with restricted cusp opening. There is no aortic  regurgitation. There is mild to moderate aortic stenosis. Aortic valve mean pressure gradient is 9.5  mmHg. The calculated aortic valve area using the continuity equation is 1.7 cm2. AS dimensionless  index  VTI  is 0.44.  _______________   Right/ Left Catheterization 12/09/2022: 1.  Continued patency of the left main 2.  Continued patency of the LAD and diagonal stents with mild diffuse in-stent restenosis in both vessels, moderate distal edge restenosis in the LAD and the diagonal 3.  Stable but severe stenosis of a small first OM branch and total occlusion of the mid left circumflex which is now collateralized via left to right collaterals filling the distal OM 4.  Continued patency of the RCA stent with mild diffuse in-stent restenosis and mild to moderate distal RCA and proximal PDA stenoses 5.  Normal LVEDP of 12 mmHg, normal wedge pressure of 10 mmHg, then well compensated hemodynamics with normal right-sided diastolic filling pressures and preserved cardiac output of 7.4 L/min with cardiac index of 3.6 L/min/m   Recommendations: Continued medical therapy  Patient Profile   KEITHON MCCOIN is a 73 y.o. male with a history of CAD with STEMI in 04/2015 s/p DES to D1 and staged PCI with DES to LAD and RCA, ischemic cardiomyopathy/ chronic HFrEF with EF of 15-20% in 10/2022,  hypertension, hyperlipidemia, type 2 diabetes mellitus, CKD stage IV,  right hemidiaphragm paralysis, hypothyroidism, obstructive sleep apnea, tobacco use, and non-compliance who is being seen 07/08/2024 for the evaluation of CHF at the request of Dr. Georgina.   Assessment & Plan   1  VF arrest    Pt  with VF arrest early this AM  Seen by D harding   In ER at the tim   Torsade VT / VF  Unable to find strips     Started CPR, epi, shock  IV amiodarone      More VT this am     Now in SR on iv amiodarone   Continue amiodarone gtt  Will need LHC when stabilzed   Note that K ws normal today and yesterday   Will notify EP but for now manage medically     2  HFrEF   LVEF 15 to 20%   Pt admitted for acute on chronic HFrEF Echo ordered   Volume status not too bad  3  CAD   STEMI in 2016  Pt refused CABG  Underwent stage PCT/DES to mid LAD and DES to prox RCA    Last cath in 2024  See above   Continued patency of LM, LAD,   Severe stenosis of small OM1, 100% LCX  Collateralized    Patency of RCA       Optimally have LHC given Vt/VF  Rx medically for now    4  CKD   Cr this am 3.1   Cr ws 2.7 yesteday   5  Hx of Noncompliance  Signed, Vina Gull, MD  07/09/2024, 11:28 AM

## 2024-07-09 NOTE — Progress Notes (Signed)
 Nicolas Ray, MRN:  [id number], DOB:  [birth year], LOS: 1 ADMISSION DATE:  07/08/2024 History of Present Illness:  This is a 73 year old male patient with a past medical history of STEMI in August 2016 status post DES 21 and staged PCI with DES to LAD and RCA complicated by ischemic cardiomyopathy with EF 15 to 20% in March 2024, hypertension, hyperlipidemia, type 2 diabetes mellitus and CKD stage IV presenting to [facility], ED on 11/7 for evaluation of shortness of breath.  Course complicated by V-fib arrest in the ED requiring 1 round CPR 1 shock for ROSC subsequently intubation mechanical ventilation.  Admitted to the ICU for further management.  Patient initially presented to the emergency department with worsening shortness of breath for few days.  Suddenly became worse the night prior to admission.  Also described orthopnea.  He called EMS and was noted to be hypoxic.  Brought into the emergency department on CPAP.  He was found to be in CHF exacerbation with an elevated BNP at 648, chest x-ray with pulmonary vascular congestion.  He was placed on BiPAP and given IV Lasix .  Unfortunately around 10 PM patient noted to be in V-fib arrest.  Requiring 1 round to ROSC.  1 epi, 1 shock.  Subsequently bolused with amnio and started on amnio drip.  Given 2 g of mag IV.  No return of consciousness post ROSC.  Intubated in the ED.  Post ROSC EKG without any convincing ST elevation.  Cardiology at bedside and recommends supportive care.  No intervention at this time given mental status.   Significant Hospital Events: Including procedures, antibiotic start and stop dates in addition to other pertinent events   07/09/2024 - Admitted to the ICU post In hospital v.fib arrest.   Objective    Blood pressure 107/65, pulse 65, temperature 98.9 F (37.2 C), temperature source Oral, resp. rate 19, height 5' 8 (1.727 m), weight 85 kg, SpO2 99%.    Vent Mode: PRVC FiO2 (%):  [50 %-100 %] 50 % Set Rate:   [20 bmp-24 bmp] 24 bmp Vt Set:  [550 mL] 550 mL PEEP:  [5 cmH20] 5 cmH20 Plateau Pressure:  [17 cmH20-18 cmH20] 17 cmH20   Intake/Output Summary (Last 24 hours) at 07/09/2024 1021 Last data filed at 07/09/2024 1000 Gross per 24 hour  Intake 1021.62 ml  Output 475 ml  Net 546.62 ml   Filed Weights   07/08/24 1952 07/08/24 2336 07/09/24 0436  Weight: 86.2 kg 85 kg 85 kg    Examination: General: Acute on chronic ill-appearing elderly male lying in bed mechanical ventilation in no acute distress HEENT: ETT, MM pink/moist, PERRL,  Neuro: Withdraws to pain in all extremities except right upper arm, opens eyes to sternal rub CV: s1s2 regular rate and rhythm, no murmur, rubs, or gallops,  PULM: Clear to auscultation bilaterally, no increased work of breathing, breath sounds, tolerating ventilator GI: soft, bowel sounds active in all 4 quadrants, non-tender, non-distended Extremities: warm/dry, 1+ lower extremity pitting edema  Skin: no rashes or lesions   Assessment and Plan   In-hospital torsades VT/V-fib arrest - Bedside nurse notified via central telemetry of change in rhythm to V. Tach with transition to V-fib on assessment patient was found pulseless.  Acute on chronic HFrEF - Patient presented with sudden onset shortness of breath with signs of pulmonary edema on admission chest x-ray and observed copious secretions seen upon intubation Ischemic cardiomyopathy -With severely reduced EF 15 to 20% and CHF exacerbation [  EKG post ROSC without ST elevations, troponin 355] History of CAD  -status post PCI to LAD, RCA in 2016.  Residual moderate disease and CTO of mid left circumflex with left-to-right collaterals. P: Cardiology following, appreciate assistance Continuous telemetry Optimize electrolytes Will need ischemic workup once stabilized Continue vasopressors for MAP goal greater than 65 Diuresis as able Pending goals of care consider placement of central access to monitor  coox  Acute Hypoxic Respiratory Failure  - Initially presented to ED with complaints of shortness of breath that began suddenly.  Initiated on BiPAP in ED. P: Continue ventilator support with lung protective strategies  Wean PEEP and FiO2 for sats greater than 90%. Head of bed elevated 30 degrees. Plateau pressures less than 30 cm H20.  Follow intermittent chest x-ray and ABG.   SAT/SBT as tolerated, mentation preclude extubation  Ensure adequate pulmonary hygiene  Follow cultures  VAP bundle in place  PAD protocol  Concern for anoxic injury - Arrest was brief however patient shows signs of potential neurological injury unable to fully determine given presence of continuous sedation P: Obtain head CT Neuroprotective measures Frequent neurochecks Minimize sedation as able  AKI superimposed on CKD stage IV - Creatinine on admission 2.71 with GFR 24 compared to creatinine 2.72 with GFR of 29 November 2023 P: Follow renal function  Monitor urine output Trend Bmet Avoid nephrotoxins Ensure adequate renal perfusion   Essential hypertension Hyperlipidemia P: Hold home medications given shock state Continuous telemetry  Type 2 diabetes P: SSI CBG checks every 4 CBG goal 140-180  Medication noncompliance P: Per cardiology patient has a long history of medication noncompliance and multiple medication intolerances  GOC Overall poor prognosis, enduring a V-fib arrest with an already weak heart at 15 to 20% is detrimental.  No recovery of mental status.   Prognosis is very poor.  Awaiting family arrival for discussion of goals of care  Critical care time:   CRITICAL CARE Performed by: Nicolas Ray   Total critical care time: 42 minutes  Critical care time was exclusive of separately billable procedures and treating other patients.  Critical care was necessary to treat or prevent imminent or life-threatening deterioration.  Critical care was time spent personally by  me on the following activities: development of treatment plan with patient and/or surrogate as well as nursing, discussions with consultants, evaluation of patient's response to treatment, examination of patient, obtaining history from patient or surrogate, ordering and performing treatments and interventions, ordering and review of laboratory studies, ordering and review of radiographic studies, pulse oximetry and re-evaluation of patient's condition.  Nysir Fergusson D. Harris, NP-C [facility] Personal contact information can be found on Amion  If no contact or response made please call [phone] 07/09/2024, 10:55 AM

## 2024-07-09 NOTE — Plan of Care (Signed)
  Problem: Education: Goal: Ability to describe self-care measures that may prevent or decrease complications (Diabetes Survival Skills Education) will improve Outcome: Progressing Goal: Individualized Educational Video(s) Outcome: Progressing   Problem: Coping: Goal: Ability to adjust to condition or change in health will improve Outcome: Progressing   Problem: Fluid Volume: Goal: Ability to maintain a balanced intake and output will improve Outcome: Progressing   Problem: Health Behavior/Discharge Planning: Goal: Ability to identify and utilize available resources and services will improve Outcome: Progressing Goal: Ability to manage health-related needs will improve Outcome: Progressing   Problem: Metabolic: Goal: Ability to maintain appropriate glucose levels will improve Outcome: Progressing   Problem: Nutritional: Goal: Maintenance of adequate nutrition will improve Outcome: Progressing Goal: Progress toward achieving an optimal weight will improve Outcome: Progressing   Problem: Skin Integrity: Goal: Risk for impaired skin integrity will decrease Outcome: Progressing   Problem: Tissue Perfusion: Goal: Adequacy of tissue perfusion will improve Outcome: Progressing   Problem: Education: Goal: Knowledge of General Education information will improve Description: Including pain rating scale, medication(s)/side effects and non-pharmacologic comfort measures Outcome: Progressing   Problem: Health Behavior/Discharge Planning: Goal: Ability to manage health-related needs will improve Outcome: Progressing   Problem: Clinical Measurements: Goal: Ability to maintain clinical measurements within normal limits will improve Outcome: Progressing Goal: Will remain free from infection Outcome: Progressing Goal: Diagnostic test results will improve Outcome: Progressing Goal: Respiratory complications will improve Outcome: Progressing Goal: Cardiovascular complication will  be avoided Outcome: Progressing   Problem: Activity: Goal: Risk for activity intolerance will decrease Outcome: Progressing   Problem: Nutrition: Goal: Adequate nutrition will be maintained Outcome: Progressing   Problem: Coping: Goal: Level of anxiety will decrease Outcome: Progressing   Problem: Elimination: Goal: Will not experience complications related to bowel motility Outcome: Progressing Goal: Will not experience complications related to urinary retention Outcome: Progressing   Problem: Pain Managment: Goal: General experience of comfort will improve and/or be controlled Outcome: Progressing

## 2024-07-09 NOTE — Progress Notes (Signed)
 PHARMACY - ANTICOAGULATION CONSULT NOTE  Pharmacy Consult for heparin  Indication: ACS  Labs: Recent Labs    07/08/24 0628 07/08/24 0634 07/08/24 1100 07/08/24 2253 07/09/24 0159 07/09/24 0200 07/09/24 0509 07/09/24 0653 07/09/24 0844 07/09/24 0937 07/09/24 2245  HGB 13.3   < > 12.6* 15.3  --   --  13.9  --   --   --   --   HCT 43.6   < > 40.2 45.0  --   --  41.0  --   --   --   --   PLT 218  --  179  --   --   --   --   --   --   --   --   HEPARINUNFRC  --   --   --   --   --   --   --   --   --  0.10* 0.18*  CREATININE 2.71*  --  2.83*  --   --  3.06*  --   --   --   --   --   TROPONINIHS 15   < >  --   --  355*  --   --  1,888* 2,227*  --   --    < > = values in this interval not displayed.   Assessment: 73yo male subtherapeutic on heparin  after rate change; no infusion issues or signs of bleeding per RN.  Goal of Therapy:  Heparin  level 0.3-0.7 units/ml   Plan:  Increase heparin  infusion by 3-4 units/kg/hr to 1600 units/hr. Check level in 8 hours.   Marvetta Dauphin, PharmD, BCPS 07/09/2024 11:54 PM

## 2024-07-09 NOTE — Progress Notes (Addendum)
 eLink Physician-Brief Progress Note Patient Name: Nicolas Ray DOB: 01-09-1951 MRN: [id number]   Date of Service  07/09/2024  HPI/Events of Note  Received request for foley cath placement as well as sedation.  The patient was intubated in the ED.  He is currently on levophed  and amiodarone.    BP 129/77, HR 86, RR 24, O2 sats 100%  RN also reporting anisocoria.  L is dilated and non-reactive, right is fixed and also non-reactive.  eICU Interventions  Start on propofol . Fentanyl  gtt ordered. Foley cath ordered.  Get CT head.      Intervention Category Intermediate Interventions: Other:  [name] 07/09/2024, 12:06 AM  3:27 AM 73/M with CHFrEF with EF 15 to 20%, ischemic cardiomyopathy, CAD, was being diuresed when he developed torsades then went into vfib arrest with ROSC after 2 rounds of epinphrine, defibrillation.  IV amiodarone and magnesium  given.  Pt is now intubated and sedated.   ABG 7.22/52.1/186 Data briefly reviewed.   Plan> Continue heparin  gtt.  Repeat ABG now.  Pt sedated with propofol  and fentanyl .   4:06 AM Notified of troponin at 355 <-- 18.  Pt is on heparin  gtt.   Plan> Continue to trend troponin.

## 2024-07-09 NOTE — Progress Notes (Signed)
 RT note. Patient flipped back to [facility] at this time due to ^ RR. Patient on the following settings.  RT will continue to monitor.    07/09/24 1529  Adult Ventilator Settings  Vent Mode (S)  PRVC  Vt Set 550 mL  Set Rate 24 bmp  FiO2 (%) 40 %  I Time 0.9 Sec(s)  PEEP 5 cmH20  Adult Ventilator Measurements  Peak Airway Pressure 25 L/min  Mean Airway Pressure 13 cmH20  Resp Rate Spontaneous 4 br/min  Resp Rate Total 28 br/min  Exhaled Vt 581 mL  Measured Ve 14.9 L  Auto PEEP 0 cmH20  Total PEEP 5 cmH20  SpO2 99 %  Daily Weaning Assessment  Daily Assessment of Readiness to Wean Wean protocol criteria met (SBT performed)  SBT Method CPAP 5 cm H20 and PS 5 cm H20  Weaning Start Time 1512  Patient response Failed SBT terminated  Reason SBT Terminated (S)  RR > 35 breaths/min or Frequency/TV ratio >105

## 2024-07-09 NOTE — Progress Notes (Signed)
Patient was transported to CT and back without incident  

## 2024-07-09 NOTE — Progress Notes (Signed)
 Echocardiogram 2D Echocardiogram has been performed with echo Definity .  Prisma Decarlo N Naeem Quillin,RDCS 07/09/2024, 12:38 PM

## 2024-07-09 NOTE — Progress Notes (Signed)
 RT note, Patient placed on SBT at this timer per MD,   07/09/24 1512  Vent Select  Invasive or Noninvasive Invasive  Adult Vent Y  Airway 7.5 mm  Placement Date/Time: 07/08/24 2153   Grade View: Grade 1  Placed By: ED Physician  Airway Device: Endotracheal Tube  ETT Types: Oral  Size (mm): 7.5 mm  Cuffed: Cuffed  Insertion attempts: 1  Airway Equipment: Video Laryngoscope  Placement Confirmatio...  Secured at (cm) 27 cm  Measured From Lips  Secured Location Center  Secured By English As A Second Language Teacher No  Prone position No  Cuff Pressure (cm H2O) Clear OR 27-39 CmH2O  Site Condition Dry  Adult Ventilator Settings  Vent Type Servo i  Humidity HME  Vent Mode (S)  PSV;CPAP (changed at 1506)  FiO2 (%) 40 %  Pressure Support 10 cmH20  PEEP 5 cmH20  Adult Ventilator Measurements  Peak Airway Pressure 16 L/min  Mean Airway Pressure 8 cmH20  Resp Rate Spontaneous 28 br/min  Resp Rate Total 28 br/min  Spont TV 460 mL  Measured Ve 13.3 L  Auto PEEP 0 cmH20  Total PEEP 5 cmH20  SpO2 98 %

## 2024-07-09 NOTE — H&P (Signed)
 NAMEWEI Ray, MRN:  [id number], DOB:  [birth year], LOS: 1 ADMISSION DATE:  07/08/2024 History of Present Illness:  This is a case of a 73 year old male patient with a past medical history of STEMI in August 2016 status post DES 21 and staged PCI with DES to LAD and RCA complicated by ischemic cardiomyopathy with EF 15 to 20% in March 2024, hypertension, hyperlipidemia, type 2 diabetes mellitus and CKD stage IV presenting to [facility], ED on 11/7 for evaluation of shortness of breath.  Course complicated by V-fib arrest in the ED requiring 1 round CPR 1 shock for ROSC subsequently intubation mechanical ventilation.  Admitted to the ICU for further management.  Patient initially presented to the emergency department with worsening shortness of breath for few days.  Suddenly became worse the night prior to admission.  Also described orthopnea.  He called EMS and was noted to be hypoxic.  Brought into the emergency department on CPAP.  He was found to be in CHF exacerbation with an elevated BNP at 648, chest x-ray with pulmonary vascular congestion.  He was placed on BiPAP and given IV Lasix .  Unfortunately around 10 PM patient noted to be in V-fib arrest.  Requiring 1 round to ROSC.  1 epi, 1 shock.  Subsequently bolused with amnio and started on amnio drip.  Given 2 g of mag IV.  No return of consciousness post ROSC.  Intubated in the ED.  Post ROSC EKG without any convincing ST elevation.  Cardiology at bedside and recommends supportive care.  No intervention at this time given mental status.   Significant Hospital Events: Including procedures, antibiotic start and stop dates in addition to other pertinent events   07/09/2024 - Admitted to the ICU post In hospital v.fib arrest.    Objective    Blood pressure (!) 63/49, pulse 74, temperature 98 F (36.7 C), temperature source Oral, resp. rate (!) 24, height 5' 8 (1.727 m), weight 85 kg, SpO2 97%.    Vent Mode: PRVC FiO2 (%):  [50 %-100  %] 60 % Set Rate:  [12 bmp-24 bmp] 24 bmp Vt Set:  [550 mL] 550 mL PEEP:  [5 cmH20-6 cmH20] 5 cmH20 Plateau Pressure:  [17 cmH20-18 cmH20] 18 cmH20   Intake/Output Summary (Last 24 hours) at 07/09/2024 0358 Last data filed at 07/09/2024 0243 Gross per 24 hour  Intake 589.8 ml  Output 350 ml  Net 239.8 ml   Filed Weights   07/08/24 1952 07/08/24 2336  Weight: 86.2 kg 85 kg    Examination: General: Intubated, off sedation HENT: Supple neck, dilated and fixed pupils Lungs: Coarse breath sounds bilaterally Cardiovascular: Normal S1, normal S2, regular rate and rhythm Abdomen: Soft nontender nondistended Extremities: Cold and clammy  Labs and imaging were reviewed   Assessment and Plan  This is a case of a 73 year old male patient with a past medical history of STEMI in August 2016 status post DES 21 and staged PCI with DES to LAD and RCA complicated by ischemic cardiomyopathy with EF 15 to 20% in March 2024, hypertension, hyperlipidemia, type 2 diabetes mellitus and CKD stage IV presenting to [facility], ED on 11/7 for evaluation of shortness of breath.  Course complicated by V-fib arrest in the ED requiring 1 round CPR 1 shock for ROSC subsequently intubation mechanical ventilation.  Admitted to the ICU for further management.  # In-hospital V-fib arrest in the setting of # Ischemic cardiomyopathy with severely reduced EF 15 to 20% and CHF  exacerbation [EKG post ROSC without ST elevations, troponin 355] # CAD status post PCI to LAD, RCA in 2016.  Residual moderate disease and CTO of mid left circumflex with left-to-right collaterals. # Cardiogenic shock # Medication noncompliance # AKI with creatinine 3.06 mg/dL from a baseline of 2.2 mg/dL.  Neuro: Propofol  and fentanyl  for analgosedation.  Normothermic protocol.  CT head without contrast.  Daily SAT's to assess mental status. CVS: Nor epi for MAP greater than 65.  Amiodarone 60 mg/h.  Heparin  drip per ACS protocol.  Appreciate  cardiology's input. Plavix  75mg  daily. Echocardiogram.  GI: NPO.  Can start trickle feeds in the AM. Renal: Avoid nephrotoxic agents.  IV Lasix  40 mg twice daily. Replete electrolytes as needed. Heme: Heparin  drip as above Endo: POC 140-180. C/w home levothyroxine .   GOC: Overall poor prognosis, enduring a V-fib arrest with an already weak heart at 15 to 20% is detrimental.  No recovery of mental status.  Would recommend goals of care discussion with the family in the morning.  Prognosis is very poor.  Critical care time: 60 minutes    Nicolas Barn, MD [facility] 07/09/2024 4:07 AM

## 2024-07-09 NOTE — IPAL (Signed)
  Interdisciplinary Goals of Care Family Meeting   Date carried out: 07/09/2024  Location of the meeting: Bedside  Member's involved: Physician, Bedside Registered Nurse, and Family Member or next of kin  Durable Power of Attorney or acting medical decision maker: Shared among sisters    Discussion: Sisters, education officer, environmental, and family friend at bedside for update.  All family members/friends updated regarding current multisystem organ failure in the setting of recent cardiac arrest.  At this time we will continue current aggressive interventions with hope of recovery however given patient's deconditioned state at baseline there is concern for poor quality of life.  Family understands this and at this time would like to continue full code but will discuss potential changing CODE STATUS to DNR depending on how things progress.  Code status:   Code Status: Full Code   Disposition: Continue current acute care  Time spent for the meeting: 35 min    Doreen Garretson D. Harris, NP  07/09/2024, 2:24 PM

## 2024-07-10 ENCOUNTER — Other Ambulatory Visit: Payer: Self-pay

## 2024-07-10 DIAGNOSIS — E039 Hypothyroidism, unspecified: Secondary | ICD-10-CM

## 2024-07-10 DIAGNOSIS — I5023 Acute on chronic systolic (congestive) heart failure: Secondary | ICD-10-CM | POA: Diagnosis not present

## 2024-07-10 DIAGNOSIS — J9601 Acute respiratory failure with hypoxia: Secondary | ICD-10-CM | POA: Diagnosis not present

## 2024-07-10 DIAGNOSIS — Z711 Person with feared health complaint in whom no diagnosis is made: Secondary | ICD-10-CM

## 2024-07-10 DIAGNOSIS — Z515 Encounter for palliative care: Secondary | ICD-10-CM

## 2024-07-10 DIAGNOSIS — E1165 Type 2 diabetes mellitus with hyperglycemia: Secondary | ICD-10-CM

## 2024-07-10 DIAGNOSIS — Z66 Do not resuscitate: Secondary | ICD-10-CM

## 2024-07-10 DIAGNOSIS — I4901 Ventricular fibrillation: Secondary | ICD-10-CM | POA: Diagnosis not present

## 2024-07-10 DIAGNOSIS — Z7189 Other specified counseling: Secondary | ICD-10-CM

## 2024-07-10 DIAGNOSIS — I469 Cardiac arrest, cause unspecified: Secondary | ICD-10-CM | POA: Diagnosis not present

## 2024-07-10 DIAGNOSIS — J810 Acute pulmonary edema: Secondary | ICD-10-CM

## 2024-07-10 LAB — GLUCOSE, CAPILLARY
Glucose-Capillary: 115 mg/dL — ABNORMAL HIGH (ref 70–99)
Glucose-Capillary: 122 mg/dL — ABNORMAL HIGH (ref 70–99)
Glucose-Capillary: 144 mg/dL — ABNORMAL HIGH (ref 70–99)
Glucose-Capillary: 155 mg/dL — ABNORMAL HIGH (ref 70–99)
Glucose-Capillary: 166 mg/dL — ABNORMAL HIGH (ref 70–99)
Glucose-Capillary: 178 mg/dL — ABNORMAL HIGH (ref 70–99)

## 2024-07-10 LAB — BASIC METABOLIC PANEL WITH GFR
Anion gap: 16 — ABNORMAL HIGH (ref 5–15)
BUN: 59 mg/dL — ABNORMAL HIGH (ref 8–23)
CO2: 20 mmol/L — ABNORMAL LOW (ref 22–32)
Calcium: 8.6 mg/dL — ABNORMAL LOW (ref 8.9–10.3)
Chloride: 98 mmol/L (ref 98–111)
Creatinine, Ser: 4.06 mg/dL — ABNORMAL HIGH (ref 0.61–1.24)
GFR, Estimated: 15 mL/min — ABNORMAL LOW (ref >60)
Glucose, Bld: 154 mg/dL — ABNORMAL HIGH (ref 70–99)
Potassium: 4.3 mmol/L (ref 3.5–5.1)
Sodium: 134 mmol/L — ABNORMAL LOW (ref 135–145)

## 2024-07-10 LAB — CBC
HCT: 41.3 % (ref 39.0–52.0)
Hemoglobin: 13.1 g/dL (ref 13.0–17.0)
MCH: 21.6 pg — ABNORMAL LOW (ref 26.0–34.0)
MCHC: 31.7 g/dL (ref 30.0–36.0)
MCV: 68.2 fL — ABNORMAL LOW (ref 80.0–100.0)
Platelets: 163 K/uL (ref 150–400)
RBC: 6.06 MIL/uL — ABNORMAL HIGH (ref 4.22–5.81)
RDW: 14.6 % (ref 11.5–15.5)
WBC: 15.6 K/uL — ABNORMAL HIGH (ref 4.0–10.5)
nRBC: 0 % (ref 0.0–0.2)

## 2024-07-10 LAB — HEPATIC FUNCTION PANEL
ALT: 23 U/L (ref 0–44)
AST: 23 U/L (ref 15–41)
Albumin: 3.4 g/dL — ABNORMAL LOW (ref 3.5–5.0)
Alkaline Phosphatase: 66 U/L (ref 38–126)
Bilirubin, Direct: 0.2 mg/dL (ref 0.0–0.2)
Indirect Bilirubin: 0.9 mg/dL (ref 0.3–0.9)
Total Bilirubin: 1.1 mg/dL (ref 0.0–1.2)
Total Protein: 7.1 g/dL (ref 6.5–8.1)

## 2024-07-10 LAB — LACTIC ACID, PLASMA: Lactic Acid, Venous: 1.1 mmol/L (ref 0.5–1.9)

## 2024-07-10 LAB — HEPARIN LEVEL (UNFRACTIONATED): Heparin Unfractionated: 0.32 [IU]/mL (ref 0.30–0.70)

## 2024-07-10 LAB — MAGNESIUM: Magnesium: 2.4 mg/dL (ref 1.7–2.4)

## 2024-07-10 MED ORDER — MIDAZOLAM HCL (PF) 2 MG/2ML IJ SOLN
2.0000 mg | Freq: Once | INTRAMUSCULAR | Status: AC
  Administered 2024-07-10: 2 mg via INTRAVENOUS

## 2024-07-10 MED ORDER — MIDAZOLAM HCL 2 MG/2ML IJ SOLN
INTRAMUSCULAR | Status: AC
  Filled 2024-07-10: qty 2

## 2024-07-10 MED ORDER — FUROSEMIDE 10 MG/ML IJ SOLN
80.0000 mg | INTRAMUSCULAR | Status: AC
  Administered 2024-07-10 (×2): 80 mg via INTRAVENOUS
  Filled 2024-07-10 (×2): qty 8

## 2024-07-10 MED ORDER — SODIUM CHLORIDE 0.9% FLUSH: 10.0000 mL | INTRAVENOUS | Status: DC | PRN

## 2024-07-10 MED ORDER — ONDANSETRON HCL 4 MG/2ML IJ SOLN: 4.0000 mg | Freq: Four times a day (QID) | INTRAMUSCULAR | Status: DC | PRN

## 2024-07-10 MED ORDER — CLOPIDOGREL BISULFATE 75 MG PO TABS
75.0000 mg | ORAL_TABLET | Freq: Every day | ORAL | Status: DC
  Administered 2024-07-10: 75 mg
  Filled 2024-07-10: qty 1

## 2024-07-10 MED ORDER — DEXMEDETOMIDINE HCL IN NACL 400 MCG/100ML IV SOLN
0.0000 ug/kg/h | INTRAVENOUS | Status: DC
  Administered 2024-07-10: 0.4 ug/kg/h via INTRAVENOUS
  Filled 2024-07-10: qty 100

## 2024-07-10 MED ORDER — SODIUM CHLORIDE 0.9% FLUSH
10.0000 mL | Freq: Two times a day (BID) | INTRAVENOUS | Status: DC
  Administered 2024-07-10 – 2024-07-11 (×4): 10 mL
  Administered 2024-07-12: 30 mL
  Administered 2024-07-13 (×2): 10 mL

## 2024-07-10 NOTE — Progress Notes (Addendum)
 Nicolas Ray, MRN:  [id number], DOB:  01/16/1951, LOS: 2 ADMISSION DATE:  07/08/2024 History of Present Illness:  This is a 73 year old male patient with a past medical history of STEMI in August 2016 status post DES 21 and staged PCI with DES to LAD and RCA complicated by ischemic cardiomyopathy with EF 15 to 20% in March 2024, hypertension, hyperlipidemia, type 2 diabetes mellitus and CKD stage IV presenting to [facility] ED on 11/7 for evaluation of shortness of breath.  Course complicated by V-fib arrest in the ED requiring 1 round CPR 1 shock for ROSC subsequently intubation mechanical ventilation.  Admitted to the ICU for further management.  Patient initially presented to the emergency department with worsening shortness of breath for few days.  Suddenly became worse the night prior to admission.  Also described orthopnea.  He called EMS and was noted to be hypoxic.  Brought into the emergency department on CPAP.  He was found to be in CHF exacerbation with an elevated BNP at 648, chest x-ray with pulmonary vascular congestion.  He was placed on BiPAP and given IV Lasix .  Unfortunately around 10 PM patient noted to be in V-fib arrest.  Requiring 1 round to ROSC.  1 epi, 1 shock.  Subsequently bolused with amnio and started on amnio drip.  Given 2 g of mag IV.  No return of consciousness post ROSC.  Intubated in the ED.  Post ROSC EKG without any convincing ST elevation.  Cardiology at bedside and recommends supportive care.  No intervention at this time given mental status.   Significant Hospital Events: Including procedures, antibiotic start and stop dates in addition to other pertinent events   07/09/2024 - Admitted to the ICU post In hospital v.fib arrest.   Subjective:   Overnight tried to self extubate. He denies complains this morning other than wanting to be out of restraints.  Objective    Blood pressure 113/66, pulse (!) 57, temperature (!) 97 F (36.1 C), temperature  source Axillary, resp. rate (!) 24, height 5' 8 (1.727 m), weight 82.6 kg, SpO2 100%.    Vent Mode: PRVC FiO2 (%):  [40 %-60 %] 40 % Set Rate:  [24 bmp] 24 bmp Vt Set:  [526 mL-550 mL] 526 mL PEEP:  [5 cmH20] 5 cmH20 Pressure Support:  [10 cmH20] 10 cmH20 Plateau Pressure:  [16 cmH20-19 cmH20] 19 cmH20   Intake/Output Summary (Last 24 hours) at 07/10/2024 0729 Last data filed at 07/10/2024 0600 Gross per 24 hour  Intake 1396.37 ml  Output 1875 ml  Net -478.63 ml   Filed Weights   07/08/24 2336 07/09/24 0436 07/10/24 0629  Weight: 85 kg 85 kg 82.6 kg    Examination: General: critically ill appearing man lying in bed in NAD HEENT: [place]/AT, eyes anicteric Neuro: RASS 0, following commands and nodding.  CV: S1S2, RRR PULM: breathing comfortably on 8/5, CTAB GI: soft, NT Extremities: no cyanosis  Na+ 134 BUN 59 Cr 4.06 WBC 15.6 H/H 13.1/41.3 Platelets 163 CT head with contrast>  no acute abnormalities   Assessment and Plan   In-hospital torsades VT/V-fib arrest Acute on chronic HFrEF due to ischemic cardiomyopathy, baseline EF 15 - 20% History of CAD -status post PCI to LAD, RCA in 2016.  Residual moderate disease and CTO of mid left circumflex with left-to-right collaterals. -PICC for CVP & coox monitoring -lasix  80mg  x 2 doses again this morning -GDMT limited with renal failure; long term has not been compliant with this -  has refused AICD in the past -monitor electrolytes and replete as needed  -DAPT, statin. Giving plavix  before extubation today -heparin  -con't amidoarone  Acute hypoxic respiratory failure due to acute pulmonary edema -LTVV -VAP prevention protocol -PAD protocol for sedation -daily SAT & SBT; hopeful for extubation to bipap today  Acute encephalopathy post-arrest> improved today. Head CT w/o acute findings -neuroprotective measures  AKI superimposed on CKD stage IV -strict I/O -renally dose meds, avoid nephrotoxic meds  Essential  hypertension Hyperlipidemia -statin -cleviprex PRN to maintain SBP <140   Type 2 diabetes w/ hyperglycemia; prediabetes A1c 5.9 -SSI PRN -goal BG 140-180  Medication noncompliance -long term this will be the biggest predictor of success for him; needs to be addressed as an overall goals of care   Hypothyroidism -synthroid   Critical care time:    This patient is critically ill with multiple organ system failure which requires frequent high complexity decision making, assessment, support, evaluation, and titration of therapies. This was completed through the application of advanced monitoring technologies and extensive interpretation of multiple databases. During this encounter critical care time was devoted to patient care services described in this note for 36 minutes.  Leita SHAUNNA Gaskins, DO 07/10/24 7:51 AM [facility]  For contact information, see Amion. If no response to pager, please call PCCM consult pager. After hours, 7PM- 7AM, please call Elink.

## 2024-07-10 NOTE — Progress Notes (Signed)
 Rounding Note   Patient Name: ARISTEO HANKERSON Date of Encounter: 07/10/2024  [facility] Cardiologist: Ozell Fell, MD   Subjective Pt extubated  Breathing is OK  Chest is sore   Scheduled Meds:  arformoterol  15 mcg Nebulization BID   Chlorhexidine  Gluconate Cloth  6 each Topical Daily   clopidogrel   75 mg Per Tube Daily   docusate  100 mg Per Tube BID   insulin  aspart  0-15 Units Subcutaneous Q4H   levothyroxine   150 mcg Per Tube Q0600   nicotine  14 mg Transdermal Daily   polyethylene glycol  17 g Per Tube Daily   revefenacin  175 mcg Nebulization Daily   sodium chloride  flush  10-40 mL Intracatheter Q12H   Continuous Infusions:  amiodarone 30 mg/hr (07/10/24 1000)   clevidipine Stopped (07/09/24 1629)   heparin  1,600 Units/hr (07/10/24 1000)   norepinephrine  (LEVOPHED ) Adult infusion 2 mcg/min (07/10/24 1000)   PRN Meds: docusate sodium , ipratropium-albuterol, ondansetron  (ZOFRAN ) IV, mouth rinse, polyethylene glycol, sodium chloride  flush   Vital Signs  Vitals:   07/10/24 1045 07/10/24 1100 07/10/24 1115 07/10/24 1130  BP: 129/89 118/63 106/63 123/75  Pulse: 77 79 80 84  Resp: (!) 28 (!) 29 (!) 26 (!) 24  Temp:      TempSrc:      SpO2: 95% 96% 93% 95%  Weight:      Height:        Intake/Output Summary (Last 24 hours) at 07/10/2024 1154 Last data filed at 07/10/2024 1000 Gross per 24 hour  Intake 1366.98 ml  Output 1905 ml  Net -538.02 ml      07/10/2024    6:29 AM 07/09/2024    4:36 AM 07/08/2024   11:36 PM  Last 3 Weights  Weight (lbs) 182 lb 1.6 oz 187 lb 6.3 oz 187 lb 6.3 oz  Weight (kg) 82.6 kg 85 kg 85 kg      Telemetry SR  5 beat NSVT   - Personally Reviewed  ECG  No new  Physical Exam  GEN:Pt in NAD  Neck: JVP is not elevated  Cardiac: RRR, no murmurs, Respiratory: Rhonchi bilaterally  GI: Soft, nontender, non-distended  MS: Triv LE  edema;   Labs High Sensitivity Troponin:   Recent Labs  Lab 07/08/24 0628  07/08/24 0825 07/09/24 0159 07/09/24 0653 07/09/24 0844  TROPONINIHS 15 18* 355* 1,888* 2,227*     Chemistry Recent Labs  Lab 07/08/24 0628 07/08/24 0634 07/08/24 1100 07/08/24 2253 07/09/24 0159 07/09/24 0200 07/09/24 0509 07/10/24 0219  NA 136   < >  --    < >  --  135 134* 134*  K 4.9   < >  --    < >  --  4.4 4.7 4.3  CL 104  --   --   --   --  100  --  98  CO2 19*  --   --   --   --  19*  --  20*  GLUCOSE 154*  --   --   --   --  223*  --  154*  BUN 47*  --   --   --   --  51*  --  59*  CREATININE 2.71*  --  2.83*  --   --  3.06*  --  4.06*  CALCIUM  8.4*  --   --   --   --  8.8*  --  8.6*  MG  --   --   --   --  3.0*  --   --  2.4  PROT  --   --   --   --   --   --   --  7.1  ALBUMIN  --   --   --   --   --   --   --  3.4*  AST  --   --   --   --   --   --   --  23  ALT  --   --   --   --   --   --   --  23  ALKPHOS  --   --   --   --   --   --   --  66  BILITOT  --   --   --   --   --   --   --  1.1  GFRNONAA 24*  --  23*  --   --  21*  --  15*  ANIONGAP 13  --   --   --   --  16*  --  16*   < > = values in this interval not displayed.    Lipids  Recent Labs  Lab 07/09/24 0159  TRIG 60    Hematology Recent Labs  Lab 07/08/24 0628 07/08/24 0634 07/08/24 1100 07/08/24 2253 07/09/24 0509 07/10/24 0219  WBC 9.0  --  9.1  --   --  15.6*  RBC 6.15*  --  5.75  --   --  6.06*  HGB 13.3   < > 12.6* 15.3 13.9 13.1  HCT 43.6   < > 40.2 45.0 41.0 41.3  MCV 70.9*  --  69.9*  --   --  68.2*  MCH 21.6*  --  21.9*  --   --  21.6*  MCHC 30.5  --  31.3  --   --  31.7  RDW 15.4  --  14.8  --   --  14.6  PLT 218  --  179  --   --  163   < > = values in this interval not displayed.   Thyroid   Recent Labs  Lab 07/08/24 2014  TSH 2.112    BNP Recent Labs  Lab 07/08/24 0628  BNP 648.0*    DDimer No results for input(s): DDIMER in the last 168 hours.   Radiology  US  EKG SITE RITE Result Date: 07/10/2024 If Site Rite image not attached, placement could not  be confirmed due to current cardiac rhythm.  CT HEAD WO CONTRAST ( ) Result Date: 07/10/2024 EXAM: CT HEAD WITHOUT CONTRAST 07/09/2024 10:22:40 PM TECHNIQUE: CT of the head was performed without the administration of intravenous contrast. Automated exposure control, iterative reconstruction, and/or weight based adjustment of the mA/kV was utilized to reduce the radiation dose to as low as reasonably achievable. COMPARISON: None available. CLINICAL HISTORY: Neuro deficit, acute, stroke suspected FINDINGS: BRAIN AND VENTRICLES: No acute hemorrhage. No evidence of acute large vascular territory infarct. No hydrocephalus. No extra-axial collection. No mass effect or midline shift. Moderate patchy white matter hypodensities are compatible with chronic microvascular ischemic change. ORBITS: No acute abnormality. SINUSES: Partially opacified left maxillary and right sphenoid sinuses. SOFT TISSUES AND SKULL: No acute soft tissue abnormality. No skull fracture. IMPRESSION: 1. No acute intracranial abnormality. 2. Moderate chronic microvascular ischemic change. Electronically signed by: Gilmore Molt MD 07/10/2024 12:13 AM EST RP Workstation: [id number]   ECHOCARDIOGRAM COMPLETE Result Date: 07/09/2024    ECHOCARDIOGRAM REPORT  Patient Name:   CASS VANDERMEULEN Date of Exam: 07/09/2024 Medical Rec #:  [id number]   Height:       68.0 in Accession #:    [id number]  Weight:       187.4 lb Date of Birth:  Aug 16, 1951    BSA:          1.988 m Patient Age:    73 years    BP:           83/57 mmHg Patient Gender: M           HR:           63 bpm. Exam Location:  Inpatient Procedure: 2D Echo, Color Doppler and Cardiac Doppler (Both Spectral and Color            Flow Doppler were utilized during procedure). Indications:    CHF-Acute Systolic  History:        Patient has prior history of Echocardiogram examinations, most                 recent 01/24/2021. Ischemic Cardiomyopathy, Acute MI, CAD and                 SETMI; Risk  Factors:Obstructive Sleep Apnea and Current Smoker.                 Hypothyroidism.  Sonographer:    Logan Shove RDCS Referring Phys: [id number] CALLIE E GOODRICH IMPRESSIONS  1. Left ventricular ejection fraction, by estimation, is 20 to 25%. The left ventricle has severely decreased function. The left ventricle demonstrates global hypokinesis. The left ventricular internal cavity size was mildly dilated. There is mild left ventricular hypertrophy. Left ventricular diastolic parameters are indeterminate. Elevated left atrial pressure.  2. Right ventricular systolic function is normal. The right ventricular size is normal.  3. Left atrial size was mildly dilated.  4. Right atrial size was mildly dilated.  5. The mitral valve is normal in structure. No evidence of mitral valve regurgitation. No evidence of mitral stenosis.  6. Low flow low gradient moderate aortic stenosis. Mean grad 6, AVA VTI 1.34, DI 0.39, SVI 23. . The aortic valve was not well visualized. There is moderate calcification of the aortic valve. There is moderate thickening of the aortic valve. Aortic valve regurgitation is not visualized. Mild aortic valve stenosis. Aortic valve area, by VTI measures 1.34 cm. Aortic valve mean gradient measures 6.0 mmHg. FINDINGS  Left Ventricle: Left ventricular ejection fraction, by estimation, is 20 to 25%. The left ventricle has severely decreased function. The left ventricle demonstrates global hypokinesis. The left ventricular internal cavity size was mildly dilated. There is mild left ventricular hypertrophy. Left ventricular diastolic parameters are indeterminate. Elevated left atrial pressure. Right Ventricle: The right ventricular size is normal. Right vetricular wall thickness was not well visualized. Right ventricular systolic function is normal. Left [facility]: Left atrial size was mildly dilated. Right [facility]: Right atrial size was mildly dilated. Pericardium: There is no evidence of pericardial effusion.  Mitral Valve: The mitral valve is normal in structure. There is mild thickening of the mitral valve leaflet(s). There is mild calcification of the mitral valve leaflet(s). Mild mitral annular calcification. No evidence of mitral valve regurgitation. No evidence of mitral valve stenosis. Tricuspid Valve: The tricuspid valve is normal in structure. Tricuspid valve regurgitation is not demonstrated. No evidence of tricuspid stenosis. Aortic Valve: Low flow low gradient moderate aortic stenosis. Mean grad 6, AVA VTI 1.34, DI 0.39, SVI 23. The aortic  valve was not well visualized. There is moderate calcification of the aortic valve. There is moderate thickening of the aortic valve. There is moderate aortic valve annular calcification. Aortic valve regurgitation is not visualized. Mild aortic stenosis is present. Aortic valve mean gradient measures 6.0 mmHg. Aortic valve peak gradient measures 12.7 mmHg. Aortic valve area, by VTI measures 1.34 cm. Pulmonic Valve: The pulmonic valve was not well visualized. Pulmonic valve regurgitation is not visualized. No evidence of pulmonic stenosis. Aorta: The aortic root and ascending aorta are structurally normal, with no evidence of dilitation. IAS/Shunts: No atrial level shunt detected by color flow Doppler.  LEFT VENTRICLE PLAX 2D LVIDd:         6.10 cm      Diastology LVIDs:         5.60 cm      LV e' medial:    5.00 cm/s LV PW:         1.10 cm      LV E/e' medial:  20.4 LV IVS:        1.20 cm      LV e' lateral:   8.70 cm/s LVOT diam:     2.10 cm      LV E/e' lateral: 11.7 LV SV:         45 LV SV Index:   23 LVOT Area:     3.46 cm LV IVRT:       224 msec  LV Volumes (MOD) LV vol d, MOD A2C: 205.0 ml LV vol d, MOD A4C: 167.0 ml LV vol s, MOD A2C: 165.0 ml LV vol s, MOD A4C: 135.0 ml LV SV MOD A2C:     40.0 ml LV SV MOD A4C:     167.0 ml LV SV MOD BP:      39.3 ml RIGHT VENTRICLE            IVC RV Basal diam:  3.10 cm    IVC diam: 1.90 cm RV S prime:     9.79 cm/s TAPSE  (M-mode): 2.1 cm     PULMONARY VEINS                            Diastolic Velocity: 36.10 cm/s                            S/D Velocity:       0.70                            Systolic Velocity:  24.20 cm/s LEFT ATRIUM              Index        RIGHT ATRIUM           Index LA diam:        3.40 cm  1.71 cm/m   RA Area:     17.40 cm LA Vol (A2C):   52.0 ml  26.16 ml/m  RA Volume:   43.70 ml  21.98 ml/m LA Vol (A4C):   109.0 ml 54.83 ml/m LA Biplane Vol: 83.0 ml  41.75 ml/m  AORTIC VALVE AV Area (Vmax):    1.38 cm AV Area (Vmean):   1.39 cm AV Area (VTI):     1.34 cm AV Vmax:           178.00 cm/s AV Vmean:  115.000 cm/s AV VTI:            0.336 m AV Peak Grad:      12.7 mmHg AV Mean Grad:      6.0 mmHg LVOT Vmax:         70.90 cm/s LVOT Vmean:        46.300 cm/s LVOT VTI:          0.130 m LVOT/AV VTI ratio: 0.39  AORTA Ao Root diam: 3.10 cm Ao Asc diam:  3.10 cm MITRAL VALVE MV Area (PHT): 3.60 cm     SHUNTS MV Decel Time: 211 msec     Systemic VTI:  0.13 m MV E velocity: 102.00 cm/s  Systemic Diam: 2.10 cm MV A velocity: 93.80 cm/s MV E/A ratio:  1.09 Dorn Nahjae Hoeg MD Electronically signed by Dorn Ewen Varnell MD Signature Date/Time: 07/09/2024/1:23:21 PM    Final    DG Abd 1 View Result Date: 07/09/2024 EXAM: 1 VIEW XRAY OF THE ABDOMEN 07/09/2024 02:44:00 AM COMPARISON: None available. CLINICAL HISTORY: Encounter for imaging study to confirm orogastric (OG) tube placement. FINDINGS: LINES, TUBES AND DEVICES: Enteric tube in place with distal tip terminating within the expected location of the gastric antrum. BOWEL: Nonobstructive bowel gas pattern. BONES: No acute osseous abnormality. IMPRESSION: 1. Orogastric tube appropriately positioned with distal tip in the gastric antrum. Electronically signed by: Oneil Devonshire MD 07/09/2024 03:05 AM EST RP Workstation: [id number]   DG Chest Portable 1 View Result Date: 07/08/2024 EXAM: 1 VIEW(S) XRAY OF THE CHEST 07/08/2024 10:12:00 PM COMPARISON:  07/08/2024 CLINICAL HISTORY: post intubation FINDINGS: LINES, TUBES AND DEVICES: Endotracheal tube in place with tip 3.3 cm above the carina. LUNGS AND PLEURA: Mild increased interstitial markings. No focal pulmonary opacity. No pneumothorax. HEART AND MEDIASTINUM: Aortic calcification. BONES AND SOFT TISSUES: No acute osseous abnormality. IMPRESSION: 1. Endotracheal tube appropriately positioned with tip 3.3 cm above the carina. 2. Mild increased interstitial markings. Electronically signed by: Oneil Devonshire MD 07/08/2024 10:25 PM EST RP Workstation: [id number]    Cardiac Studies  Echo   07/09/24   1. Left ventricular ejection fraction, by estimation, is 20 to 25%. The  left ventricle has severely decreased function. The left ventricle  demonstrates global hypokinesis. The left ventricular internal cavity size  was mildly dilated. There is mild left  ventricular hypertrophy. Left ventricular diastolic parameters are  indeterminate. Elevated left atrial pressure.   2. Right ventricular systolic function is normal. The right ventricular  size is normal.   3. Left atrial size was mildly dilated.   4. Right atrial size was mildly dilated.   5. The mitral valve is normal in structure. No evidence of mitral valve  regurgitation. No evidence of mitral stenosis.   6. Low flow low gradient moderate aortic stenosis. Mean grad 6, AVA VTI  1.34, DI 0.39, SVI 23. . The aortic valve was not well visualized. There  is moderate calcification of the aortic valve. There is moderate  thickening of the aortic valve. Aortic  valve regurgitation is not visualized. Mild aortic valve stenosis. Aortic  valve area, by VTI measures 1.34 cm. Aortic valve mean gradient measures  6.0 mmHg.   Complete Echocardiogram 11/19/2022 ([facility]): - Left ventricular systolic function is severely reducedat 15%.  There is severe [Grade III] diastolic dysfunction [restrictive filling pattern], with elevated left  atrial pressure.   - The right ventricle is normal in size and function.  - Normal atrial sizes.  - Diffuse thickening of the aortic valve with  restricted cusp opening. Hemodynamically significant  valvular aortic stenosis cannot be excluded due to elevated heart rate. Would repeat limited TTE  when heart rate is better controlled for assessment of aortic valve function.  - There is trace mitral regurgitation.  - There is trace tricuspid regurgitation.  - The pulmonic valve is normal in structure and function.  - IVC size was normal.  - There is no pericardial effusion.  _______________   Limited Echocardiogram 11/20/2022 ([facility]): Diffuse thickening of the aortic valve with restricted cusp opening. There is no aortic  regurgitation. There is mild to moderate aortic stenosis. Aortic valve mean pressure gradient is 9.5  mmHg. The calculated aortic valve area using the continuity equation is 1.7 cm2. AS dimensionless  index  VTI  is 0.44.  _______________   Right/ Left Catheterization 12/09/2022: 1.  Continued patency of the left main 2.  Continued patency of the LAD and diagonal stents with mild diffuse in-stent restenosis in both vessels, moderate distal edge restenosis in the LAD and the diagonal 3.  Stable but severe stenosis of a small first OM branch and total occlusion of the mid left circumflex which is now collateralized via left to right collaterals filling the distal OM 4.  Continued patency of the RCA stent with mild diffuse in-stent restenosis and mild to moderate distal RCA and proximal PDA stenoses 5.  Normal LVEDP of 12 mmHg, normal wedge pressure of 10 mmHg, then well compensated hemodynamics with normal right-sided diastolic filling pressures and preserved cardiac output of 7.4 L/min with cardiac index of 3.6 L/min/m   Recommendations: Continued medical therapy  Patient Profile   RAYMONDO GARCIALOPEZ is a 73 y.o. male with a history of CAD with STEMI in 04/2015 s/p DES to D1 and staged PCI with DES  to LAD and RCA, ischemic cardiomyopathy/ chronic HFrEF with EF of 15-20% in 10/2022, hypertension, hyperlipidemia, type 2 diabetes mellitus, CKD stage IV,  right hemidiaphragm paralysis, hypothyroidism, obstructive sleep apnea, tobacco use, and non-compliance who is being seen 07/08/2024 for the evaluation of CHF at the request of Dr. Georgina.   Assessment & Plan   1  VF arrest    Pt  with VF arrest early Sat AM   In ERat time  Reported  [place] VT / VF  Unable to find strips     Started CPR, epi, shock  IV amiodarone      Had VT yesterday  Now in SR  1 5 beat VT  Keep on amiodarone Cr is 4 this AM     Was 2.8 in Nov 2025 , 2.27 in March 2025 Will review with EP     For now medical Rx as he recovers from event    2  HFrEF    Pt admitted for acute on chronic HFrEF Echo yesterday  LVEF 20 to 25%  RVEF normal Pt given 160 lasix  this AM   3  CAD   STEMI in 2016  Pt refused CABG  Underwent stage PCT/DES to mid LAD and DES to prox RCA    Last cath in 2024  See above   Continued patency of LM, LAD,   Severe stenosis of small OM1, 100% LCX  Collateralized    Patency of RCA       Ptdenies CP   Trop on admit was 18, 15, 14   1888 this am  Probably related to events / demand ischemia      4  CKD   Cr this am  4   was 2.8 earlier this month, 2.27 in March 2025  5  Hx of Noncompliance     Signed, Vina Gull, MD  07/10/2024, 11:54 AM

## 2024-07-10 NOTE — Plan of Care (Signed)
  Problem: Fluid Volume: Goal: Ability to maintain a balanced intake and output will improve Outcome: Progressing   Problem: Skin Integrity: Goal: Risk for impaired skin integrity will decrease Outcome: Progressing   Problem: Tissue Perfusion: Goal: Adequacy of tissue perfusion will improve Outcome: Progressing   Problem: Coping: Goal: Ability to adjust to condition or change in health will improve Outcome: Not Progressing   Problem: Health Behavior/Discharge Planning: Goal: Ability to identify and utilize available resources and services will improve Outcome: Not Progressing

## 2024-07-10 NOTE — Progress Notes (Signed)
 eLink Physician-Brief Progress Note Patient Name: Nicolas Ray DOB: 1950/12/10 MRN: [id number]   Date of Service  07/10/2024  HPI/Events of Note  73 year old male who initially presented with heart failure exacerbation.  Subsequently suffered a V-fib arrest and ended up in the ICU.  Intubated.  He was on propofol  which was discontinued yesterday.  Currently only on fentanyl  and is not extremely agitated.  RN asking for sedation and bilateral wrist restraints.  eICU Interventions  Video assessment of patient done.  Extremely agitated and trying to get out of bed.  Being held down by nursing staff and spouse. 2 mg IV midazolam  x 1 given.  Will continue fentanyl  and dexmedetomidine infusion to keep him synchronous with the vent for possible extubation later today. Discussed with bedside RN who was present for entire video interaction.     Intervention Category Major Interventions: Delirium, psychosis, severe agitation - evaluation and management  Jerilynn Berg 07/10/2024, 4:35 AM

## 2024-07-10 NOTE — Progress Notes (Signed)
 PHARMACY - ANTICOAGULATION CONSULT NOTE  Pharmacy Consult for heparin  Indication: chest pain/ACS  Allergies  Allergen Reactions   Aluminum-Containing Compounds Other (See Comments)    Makes hands feel weak     Patient Measurements: Height: 5' 8 (172.7 cm) Weight: 82.6 kg (182 lb 1.6 oz) IBW/kg (Calculated) : 68.4 HEPARIN  DW (KG): 85.7  Vital Signs: Temp: 98.6 F (37 C) (11/09 0809) Temp Source: Oral (11/09 0809) BP: 123/75 (11/09 1130) Pulse Rate: 84 (11/09 1130)  Labs: Recent Labs    07/08/24 0628 07/08/24 0634 07/08/24 1100 07/08/24 2253 07/09/24 0159 07/09/24 0200 07/09/24 0509 07/09/24 0653 07/09/24 0844 07/09/24 0937 07/09/24 2245 07/10/24 0219 07/10/24 0806  HGB 13.3   < > 12.6* 15.3  --   --  13.9  --   --   --   --  13.1  --   HCT 43.6   < > 40.2 45.0  --   --  41.0  --   --   --   --  41.3  --   PLT 218  --  179  --   --   --   --   --   --   --   --  163  --   HEPARINUNFRC  --   --   --   --   --   --   --   --   --  0.10* 0.18*  --  0.32  CREATININE 2.71*  --  2.83*  --   --  3.06*  --   --   --   --   --  4.06*  --   TROPONINIHS 15   < >  --   --  355*  --   --  1,888* 2,227*  --   --   --   --    < > = values in this interval not displayed.    Estimated Creatinine Clearance: 17 mL/min (A) (by C-G formula based on SCr of 4.06 mg/dL (H)).   Medical History: Past Medical History:  Diagnosis Date   Acute MI, lateral wall, initial episode of care (HCC) 04/18/2015   CAD (coronary artery disease) 04/18/2015   a. Lat STEMI 8/16 - LHC:  pLAD 90, mLAD 50, D1 100, dLCx 90, OM1 80, pRCA 75, EF 35-45% >> DES to D1;  b. staged PCI 04/20/15 - DES to mid LAD and DES to prox RCA   Depression    DM2 (diabetes mellitus, type 2) (HCC)    Hypothyroidism    Ischemic cardiomyopathy    a. Echo 8/16:  Mild LVH, EF 40-45%, mid-apical anterolateral and apical AK, grade 1 diastolic dysfunction, trivial effusion   OSA (obstructive sleep apnea) 09/21/2018   ST elevation  myocardial infarction (STEMI) of lateral wall (HCC) 04/18/2015   Assessment: 82 yoM presented with SOB/HF exacerbation. PMH includes: HFrEF (LVEF 15-20% in 2024), CAD (staged PCI in 2016), HTN, HLD, T2DM, CKD stage IV, OSA, tobacco use. Pharmacy consulted to dose heparin  for ACS.  -PM: patient found unresponsive/ diaphoretic with no pulse >> Vfib > defibrillated > ROSC -No oral anticoagulation PTA -Hgb 12.6, plts 179 -Trops 18 -lovenox ordered for DVT ppx, not given  Heparin  drip 1600 uts/hr with heparin  level 0.32 at goal Cbc stable no bleeding   Goal of Therapy:  Heparin  level 0.3-0.7 units/ml Monitor platelets by anticoagulation protocol: Yes   Plan:  Continue Heparin  drip 1600 uts/hr  Daily heparin  level and CBC Monitor s/s bleeding  Olam Chalk Pharm.D. CPP, BCPS Clinical Pharmacist [phone] 07/10/2024 11:36 AM

## 2024-07-10 NOTE — Progress Notes (Addendum)
 Peripherally Inserted Central Catheter Placement  The IV Nurse has discussed with the patient and/or persons authorized to consent for the patient, the purpose of this procedure and the potential benefits and risks involved with this procedure.  The benefits include less needle sticks, lab draws from the catheter, and the patient may be discharged home with the catheter. Risks include, but not limited to, infection, bleeding, blood clot (thrombus formation), and puncture of an artery; nerve damage and irregular heartbeat and possibility to perform a PICC exchange if needed/ordered by physician.  Alternatives to this procedure were also discussed.  Bard Power PICC patient education guide, fact sheet on infection prevention and patient information card has been provided to patient /or left at bedside. Obtained the consent from sister Mare at the bedside.   PICC Placement Documentation  PICC Triple Lumen 07/10/24 Right Brachial 37 cm 0 cm (Active)  Indication for Insertion or Continuance of Line Vasoactive infusions 07/10/24 1032  Exposed Catheter (cm) 0 cm 07/10/24 1032  Site Assessment Clean, Dry, Intact 07/10/24 1032  Lumen #1 Status Flushed;Saline locked;Blood return noted 07/10/24 1032  Lumen #2 Status Flushed;Saline locked;Blood return noted 07/10/24 1032  Lumen #3 Status Flushed;Saline locked;Blood return noted 07/10/24 1032  Dressing Type Transparent;Securing device 07/10/24 1032  Dressing Status Antimicrobial disc/dressing in place;Clean, Dry, Intact 07/10/24 1032  Line Care Connections checked and tightened 07/10/24 1032  Line Adjustment (NICU/IV Team Only) No 07/10/24 1032  Dressing Intervention New dressing 07/10/24 1032  Dressing Change Due 07/17/24 07/10/24 1032       Kolston Lacount Sheral Ruth 07/10/2024, 10:34 AM

## 2024-07-10 NOTE — Progress Notes (Signed)
 Extubation Procedure Note  Patient Details:   Name: Nicolas Ray DOB: 12-Dec-1950 MRN: [id number]   Airway Documentation:    Vent end date: 07/10/24 Vent end time: 0817   Evaluation  O2 sats: stable throughout Complications: No apparent complications Patient did tolerate procedure well. Bilateral Breath Sounds: (P) Clear, Rhonchi, Diminished   Yes\ Patient extubated to 4 L [place] per order. Patient able to say name, patient with cuff leak, no stridor noted.  RT will continue to monitor.   Maryelizabeth DELENA Sprang 07/10/2024, 8:20 AM

## 2024-07-10 NOTE — Consult Note (Signed)
 Palliative Medicine Inpatient Consult Note  Consulting Provider: Dr. Gretta  Reason for consult:   Palliative Care Consult Services Palliative Medicine Consult  Reason for Consult? GOC, getting family on the same page   07/10/2024  HPI:  Per intake H&P --> 73 year old male patient with a past medical history of STEMI in August 2016 status post DES 21 and staged PCI with DES to LAD and RCA complicated by ischemic cardiomyopathy with EF 15 to 20% in March 2024, hypertension, hyperlipidemia, type 2 diabetes mellitus and CKD stage IV.   Presenting to [facility], ED on 11/7 for evaluation of shortness of breath. Course complicated by V-fib arrest in the ED requiring 1 round CPR 1 shock for ROSC subsequently intubation mechanical ventilation.   Palliative care has been asked to support additional goals of care conversations.   Clinical Assessment/Goals of Care:  *Please note that this is a verbal dictation therefore any spelling or grammatical errors are due to the Dragon Medical One system interpretation.  I have reviewed medical records including EPIC notes, labs and imaging, received report from bedside RN, assessed the patient who is lying in bed on an amiodarone, heparin , and norepinephrine  gtts.    I met with Nicolas Ray and his sister, Nicolas Ray to further discuss diagnosis prognosis, GOC, EOL wishes, disposition and options.   I introduced Palliative Medicine as specialized medical care for people living with serious illness. It focuses on providing relief from the symptoms and stress of a serious illness. The goal is to improve quality of life for both the patient and the family.  Medical History Review and Understanding:  A review of Nicolas Ray's past medical history significant for myocardial infarction, coronary artery disease, type 2 diabetes mellitus, ischemic cardiomyopathy, depression, and obstructive sleep apnea was completed.  Social History:  Nicolas Ray is originally from [place]. He moved the  [place]  roughly 62 years ago.  He had lived there for 39 years and moved down to [place] about a decade ago to be closer to his sisters.  He is 1 of 10 children-8 boys and 2 girls.  He was married.  He has 3 daughters who live in [place] .  He is awaiting his first grandchild who will be born in January.  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  He is a man of Christian faith.  Functional and Nutritional State:  Prior to hospitalization [name] was living with his sister, Nicolas Ray in a single family home. He was able to perform bADLs on his own. He had a good appetite.   Advance Directives:  A detailed discussion was had today regarding advanced directives.  Nicolas Ray has not made advanced directives. He is interested in doing so while hospitalized. He shares if unable to make decisions for himself that he would desire his sister, Nicolas Ray be his runner, broadcasting/film/video.  I provided Nicolas Ray a copy of advanced directives for him to review and complete.   Code Status:  Concepts specific to code status, artifical feeding and hydration, continued IV antibiotics and rehospitalization was had.  The difference between a aggressive medical intervention path  and a palliative comfort care path for this patient at this time was had.   Encouraged patient/family to consider DNR/DNI status understanding evidenced based poor outcomes in similar hospitalized patient, as the cause of arrest is likely associated with advanced chronic/terminal illness rather than an easily reversible acute cardio-pulmonary event. I explained that DNR/DNI does not change the medical plan and it only comes into effect  after a person has arrested (died).  It is a protective measure to keep us  from harming the patient in their last moments of life. Nicolas Ray was agreeable to DNR/DNI with understanding that patient would not receive CPR, defibrillation, ACLS medications, or intubation.   Nicolas Ray does not believe that he would want  dialysis. He does not desire living on artificial means of support.  Discussion:  A review of the events leading to hospitalization inclusive of exacerbation of Nicolas Ray's heart failure were held.  We discussed the abnormal heart rhythm he went into causing cardiac arrest.  We reviewed the aggressive modalities of care including CPR, shocks, and intubation.   We discussed [name]'s extremely poor cardiac function at this juncture in time and how his ejection fraction is less than 20%.  We discussed the shock that his body has endured as a result of his arrest.  We discussed the organ systems that are affected inclusive of his kidneys.  I shared the importance of understanding what is important to Nicolas Ray.  He emphasizes that he does not want to live on artificial measures.  He would like to continue pursuing the measures in the ICU that are presently being provided but if he were going to cardiac arrest he would not want CPR or shocks intubation or mechanical ventilatory support.  We reviewed the tenuous spot that [name] is in right now given the fragility of his organ systems.  We discussed she is walking a fine line and at any point his body may tip in one direction or the other.  I shared often if there is an inability for the body to compensate outcomes can he very poor. We reviewed should this be a reality the alternative(s) to aggressive measures inclusive of comfort measures.   We talked about transition to comfort measures in house and what that would entail inclusive of medications to control pain, dyspnea, agitation, nausea, itching, and hiccups.  We discussed stopping all uneccessary measures such as cardiac monitoring, blood draws, needle sticks, and frequent vital signs.   At this time, [name] is hoping to improve to be present for his first grandchild's birth in January.   Utilized reflective listening throughout our time together.   Discussed the importance of continued conversation with family and  their  medical providers regarding overall plan of care and treatment options, ensuring decisions are within the context of the patients values and GOCs.  Decision Maker: Nicolas Ray (sister): [phone]  SUMMARY OF RECOMMENDATIONS   DNAR/DNI, patient does not feel he would want dialysis  Patient and his sister made aware of his tenuous state and the potential for rapid decline(s) --> We discussed comfort measures as an option if Bascom should neglect to improve  Plan to continue present measures to see if improvements could be made  Patients goal is to live to see his first grandchild who is due in January 2026  Code Status/Advance Care Planning:  Palliative Prophylaxis:  Aspiration, Bowel Regimen, Delirium Protocol, Frequent Pain Assessment, Oral Care, Palliative Wound Care, and Turn Reposition  Additional Recommendations (Limitations, Scope, Preferences): Continue present measures  Psycho-social/Spiritual:  Desire for further Chaplaincy support: Yes Additional Recommendations: Education on expectations post arrest   Prognosis: Worrisome given patients organ dysfunction, V-Fib arrest, and general frailty.   Discharge Planning: To be determined.   Vitals:   07/10/24 1015 07/10/24 1030  BP: 132/80 (!) 113/58  Pulse: 77 76  Resp: (!) 28 18  Temp:    SpO2: 96% 100%  Intake/Output Summary (Last 24 hours) at 07/10/2024 1114 Last data filed at 07/10/2024 1000 Gross per 24 hour  Intake 1366.98 ml  Output 1905 ml  Net -538.02 ml   Last Weight  Most recent update: 07/10/2024  6:29 AM    Weight  82.6 kg (182 lb 1.6 oz)             LABS: CBC:    Component Value Date/Time   WBC 15.6 (H) 07/10/2024 0219   HGB 13.1 07/10/2024 0219   HGB 14.7 03/12/2023 1044   HCT 41.3 07/10/2024 0219   HCT 45.9 03/12/2023 1044   PLT 163 07/10/2024 0219   PLT 184 03/12/2023 1044   MCV 68.2 (L) 07/10/2024 0219   MCV 68 (L) 03/12/2023 1044   NEUTROABS 5.1 07/08/2024 0628    LYMPHSABS 2.8 07/08/2024 0628   MONOABS 0.8 07/08/2024 0628   EOSABS 0.3 07/08/2024 0628   BASOSABS 0.1 07/08/2024 0628   Comprehensive Metabolic Panel:    Component Value Date/Time   NA 134 (L) 07/10/2024 0219   NA 136 03/12/2023 1044   K 4.3 07/10/2024 0219   CL 98 07/10/2024 0219   CO2 20 (L) 07/10/2024 0219   BUN 59 (H) 07/10/2024 0219   BUN 40 (H) 03/12/2023 1044   CREATININE 4.06 (H) 07/10/2024 0219   CREATININE 1.47 (H) 07/18/2016 1050   GLUCOSE 154 (H) 07/10/2024 0219   CALCIUM  8.6 (L) 07/10/2024 0219   AST 23 07/10/2024 0219   ALT 23 07/10/2024 0219   ALKPHOS 66 07/10/2024 0219   BILITOT 1.1 07/10/2024 0219   BILITOT 1.1 06/29/2019 0953   PROT 7.1 07/10/2024 0219   PROT 7.8 06/29/2019 0953   ALBUMIN 3.4 (L) 07/10/2024 0219   ALBUMIN 4.4 06/29/2019 0953   Gen:  Frail elderly AA M chronically ill appearing HEENT: Dry mucous membranes CV: Regular rate and rhythm  PULM: 5L Dane, breathing slightly labored ABD: soft/nontender  EXT: BLE edema  Neuro: Alert and oriented x2  PPS:   20%   This conversation/these recommendations were discussed with patient primary care team, Dr. Gretta ______________________________________________________ Rosaline Becton [facility] Palliative Medicine Team Team Cell Phone: [phone] Please utilize secure chat with additional questions, if there is no response within 30 minutes please call the above phone number  Total Time: 75 Billing based on MDM: High  Palliative Medicine Team providers are available by phone from 7am to 7pm daily and can be reached through the team cell phone.  Should this patient require assistance outside of these hours, please call the patient's attending physician.

## 2024-07-11 DIAGNOSIS — E1122 Type 2 diabetes mellitus with diabetic chronic kidney disease: Secondary | ICD-10-CM

## 2024-07-11 DIAGNOSIS — I462 Cardiac arrest due to underlying cardiac condition: Secondary | ICD-10-CM

## 2024-07-11 DIAGNOSIS — I4901 Ventricular fibrillation: Secondary | ICD-10-CM | POA: Diagnosis not present

## 2024-07-11 DIAGNOSIS — N189 Chronic kidney disease, unspecified: Secondary | ICD-10-CM | POA: Insufficient documentation

## 2024-07-11 DIAGNOSIS — G9340 Encephalopathy, unspecified: Secondary | ICD-10-CM

## 2024-07-11 DIAGNOSIS — I130 Hypertensive heart and chronic kidney disease with heart failure and stage 1 through stage 4 chronic kidney disease, or unspecified chronic kidney disease: Secondary | ICD-10-CM

## 2024-07-11 DIAGNOSIS — N179 Acute kidney failure, unspecified: Secondary | ICD-10-CM

## 2024-07-11 LAB — CBC
HCT: 38.8 % — ABNORMAL LOW (ref 39.0–52.0)
Hemoglobin: 12.4 g/dL — ABNORMAL LOW (ref 13.0–17.0)
MCH: 21.8 pg — ABNORMAL LOW (ref 26.0–34.0)
MCHC: 32 g/dL (ref 30.0–36.0)
MCV: 68.2 fL — ABNORMAL LOW (ref 80.0–100.0)
Platelets: 145 K/uL — ABNORMAL LOW (ref 150–400)
RBC: 5.69 MIL/uL (ref 4.22–5.81)
RDW: 14.7 % (ref 11.5–15.5)
WBC: 15.9 K/uL — ABNORMAL HIGH (ref 4.0–10.5)
nRBC: 0 % (ref 0.0–0.2)

## 2024-07-11 LAB — GLUCOSE, CAPILLARY
Glucose-Capillary: 137 mg/dL — ABNORMAL HIGH (ref 70–99)
Glucose-Capillary: 147 mg/dL — ABNORMAL HIGH (ref 70–99)
Glucose-Capillary: 159 mg/dL — ABNORMAL HIGH (ref 70–99)
Glucose-Capillary: 161 mg/dL — ABNORMAL HIGH (ref 70–99)
Glucose-Capillary: 181 mg/dL — ABNORMAL HIGH (ref 70–99)
Glucose-Capillary: 98 mg/dL (ref 70–99)

## 2024-07-11 LAB — BASIC METABOLIC PANEL WITH GFR
Anion gap: 19 — ABNORMAL HIGH (ref 5–15)
BUN: 69 mg/dL — ABNORMAL HIGH (ref 8–23)
CO2: 21 mmol/L — ABNORMAL LOW (ref 22–32)
Calcium: 8.5 mg/dL — ABNORMAL LOW (ref 8.9–10.3)
Chloride: 96 mmol/L — ABNORMAL LOW (ref 98–111)
Creatinine, Ser: 5.07 mg/dL — ABNORMAL HIGH (ref 0.61–1.24)
GFR, Estimated: 11 mL/min — ABNORMAL LOW (ref >60)
Glucose, Bld: 138 mg/dL — ABNORMAL HIGH (ref 70–99)
Potassium: 3.9 mmol/L (ref 3.5–5.1)
Sodium: 136 mmol/L (ref 135–145)

## 2024-07-11 LAB — HEPARIN LEVEL (UNFRACTIONATED): Heparin Unfractionated: 0.17 [IU]/mL — ABNORMAL LOW (ref 0.30–0.70)

## 2024-07-11 LAB — MAGNESIUM: Magnesium: 2.3 mg/dL (ref 1.7–2.4)

## 2024-07-11 MED ORDER — HALOPERIDOL LACTATE 5 MG/ML IJ SOLN
INTRAMUSCULAR | Status: AC
  Filled 2024-07-11: qty 1

## 2024-07-11 MED ORDER — LORAZEPAM 2 MG/ML IJ SOLN
1.0000 mg | Freq: Once | INTRAMUSCULAR | Status: AC
  Administered 2024-07-11: 1 mg via INTRAVENOUS

## 2024-07-11 MED ORDER — ORAL CARE MOUTH RINSE: 15.0000 mL | OROMUCOSAL | Status: DC | PRN

## 2024-07-11 MED ORDER — DOCUSATE SODIUM 50 MG/5ML PO LIQD
100.0000 mg | Freq: Two times a day (BID) | ORAL | Status: DC
  Administered 2024-07-13 – 2024-07-15 (×3): 100 mg via ORAL
  Filled 2024-07-11 (×9): qty 10

## 2024-07-11 MED ORDER — ATORVASTATIN CALCIUM 40 MG PO TABS
40.0000 mg | ORAL_TABLET | Freq: Every day | ORAL | Status: DC
  Administered 2024-07-11 – 2024-07-13 (×3): 40 mg via ORAL
  Filled 2024-07-11 (×3): qty 1

## 2024-07-11 MED ORDER — INSULIN ASPART 100 UNIT/ML IJ SOLN
0.0000 [IU] | Freq: Three times a day (TID) | INTRAMUSCULAR | Status: DC
  Administered 2024-07-11 – 2024-07-12 (×3): 3 [IU] via SUBCUTANEOUS
  Administered 2024-07-13 (×2): 2 [IU] via SUBCUTANEOUS
  Filled 2024-07-11 (×2): qty 2
  Filled 2024-07-11: qty 1
  Filled 2024-07-11: qty 2
  Filled 2024-07-11 (×3): qty 3

## 2024-07-11 MED ORDER — HALOPERIDOL LACTATE 5 MG/ML IJ SOLN
5.0000 mg | Freq: Once | INTRAMUSCULAR | Status: AC
  Administered 2024-07-11: 5 mg via INTRAVENOUS

## 2024-07-11 MED ORDER — LORAZEPAM 2 MG/ML IJ SOLN
INTRAMUSCULAR | Status: AC
Start: 2024-07-11 — End: 2024-07-11
  Filled 2024-07-11: qty 1

## 2024-07-11 MED ORDER — AMIODARONE HCL 200 MG PO TABS
400.0000 mg | ORAL_TABLET | Freq: Every day | ORAL | Status: DC
  Administered 2024-07-11 – 2024-07-15 (×4): 400 mg via ORAL
  Filled 2024-07-11 (×4): qty 2

## 2024-07-11 MED ORDER — HEPARIN SODIUM (PORCINE) 5000 UNIT/ML IJ SOLN
5000.0000 [IU] | Freq: Three times a day (TID) | INTRAMUSCULAR | Status: DC
  Administered 2024-07-12 – 2024-07-13 (×4): 5000 [IU] via SUBCUTANEOUS
  Filled 2024-07-11 (×5): qty 1

## 2024-07-11 MED ORDER — LEVOTHYROXINE SODIUM 75 MCG PO TABS
150.0000 ug | ORAL_TABLET | Freq: Every day | ORAL | Status: DC
  Administered 2024-07-12 – 2024-07-15 (×3): 150 ug via ORAL
  Filled 2024-07-11: qty 6
  Filled 2024-07-11 (×3): qty 2

## 2024-07-11 MED ORDER — POLYETHYLENE GLYCOL 3350 17 G PO PACK
17.0000 g | PACK | Freq: Every day | ORAL | Status: DC
  Administered 2024-07-13 – 2024-07-15 (×2): 17 g via ORAL
  Filled 2024-07-11 (×3): qty 1

## 2024-07-11 MED ORDER — FUROSEMIDE 10 MG/ML IJ SOLN
80.0000 mg | Freq: Once | INTRAMUSCULAR | Status: AC
  Administered 2024-07-11: 80 mg via INTRAVENOUS
  Filled 2024-07-11: qty 8

## 2024-07-11 NOTE — Evaluation (Signed)
 Physical Therapy Evaluation Patient Details Name: Nicolas Ray MRN: [id number] DOB: 24-Feb-1951 Today's Date: 07/11/2024  History of Present Illness  73 y/o M presneting to ED on 2024-07-27 with difficulty breathing, pt coded with v-fib arrest 2024/07/27, intubated. Extubated 11/9. Admitted for acute CHF.    PMH includes tobacco use, STEMI, CAD s/p PCI, ICM , HFrEF, Dm2, hypothyroidism, GIB, OSA, elevated BNP, BLE edema.  Clinical Impression  Pt admitted with above diagnosis. Limited historian with impaired cognition, impulsive and restless during evaluation. Able to transfer with min assist, and ambulate with min assist +2 for frequent LOB. Refuses RW use, shows very poor awareness of deficits. Oriented to self and year only. Patient will benefit from intensive inpatient follow-up therapy, >3 hours/day. Pt currently with functional limitations due to the deficits listed below (see PT Problem List). Pt will benefit from acute skilled PT to increase their independence and safety with mobility to allow discharge.           If plan is discharge home, recommend the following: A lot of help with walking and/or transfers;A lot of help with bathing/dressing/bathroom;Assistance with cooking/housework;Direct supervision/assist for medications management;Direct supervision/assist for financial management;Assist for transportation;Help with stairs or ramp for entrance;Supervision due to cognitive status   Can travel by private vehicle        Equipment Recommendations Rolling walker (2 wheels) (TBD)  Recommendations for Other Services  Rehab consult    Functional Status Assessment Patient has had a recent decline in their functional status and demonstrates the ability to make significant improvements in function in a reasonable and predictable amount of time.     Precautions / Restrictions Precautions Precautions: Fall Recall of Precautions/Restrictions: Impaired Restrictions Weight Bearing Restrictions Per  Provider Order: No      Mobility  Bed Mobility               General bed mobility comments: OOB in chair upon arrival and departure    Transfers Overall transfer level: Needs assistance Equipment used: 1 person hand held assist Transfers: Sit to/from Stand Sit to Stand: Min assist, +2 safety/equipment           General transfer comment: mild instability/sway to R but is able to self correct, tried to encourage RW use but refuses.    Ambulation/Gait Ambulation/Gait assistance: Min assist, +2 physical assistance, +2 safety/equipment Gait Distance (Feet): 150 Feet Assistive device: 1 person hand held assist Gait Pattern/deviations: Step-through pattern, Decreased stride length, Trunk flexed, Drifts right/left, Staggering right, Staggering left, Scissoring Gait velocity: dec Gait velocity interpretation: <1.31 ft/sec, indicative of household ambulator   General Gait Details: Frequent LOB requiring min assist +2 for correction. Refuses to use RW, but held therapist's hand throughout distance. Cues for awareness, safety, and techniques to steady. VSS throughout.  Stairs            Wheelchair Mobility     Tilt Bed    Modified Rankin (Stroke Patients Only)       Balance Overall balance assessment: Needs assistance Sitting-balance support: Feet supported Sitting balance-Leahy Scale: Good     Standing balance support: During functional activity Standing balance-Leahy Scale: Fair Standing balance comment: CGA standing statically                             Pertinent Vitals/Pain Pain Assessment Pain Assessment: Faces Faces Pain Scale: Hurts a little bit Pain Location: chest Pain Descriptors / Indicators: Sore    Home Living  Family/patient expects to be discharged to:: Private residence Living Arrangements: Other relatives (sister) Available Help at Discharge: Family Type of Home: House Home Access: Level entry       Home Layout: One  level Home Equipment: None      Prior Function Prior Level of Function : Independent/Modified Independent;Driving;Working/employed             Mobility Comments: Reports ind with some instability, no device; Unreliable historian thought.       Extremity/Trunk Assessment   Upper Extremity Assessment Upper Extremity Assessment: Defer to OT evaluation    Lower Extremity Assessment Lower Extremity Assessment: Generalized weakness    Cervical / Trunk Assessment Cervical / Trunk Assessment: Normal  Communication   Communication Communication: No apparent difficulties    Cognition Arousal: Alert Behavior During Therapy: Restless, Impulsive   PT - Cognitive impairments: No family/caregiver present to determine baseline, Orientation, Awareness, Memory, Attention, Problem solving, Sequencing, Safety/Judgement   Orientation impairments: Place, Time, Situation                   PT - Cognition Comments: Aware of year, incorrect month, situation. Short term memory deficits. Following commands: Intact       Cueing       General Comments General comments (skin integrity, edema, etc.): VSS on RA.    Exercises     Assessment/Plan    PT Assessment Patient needs continued PT services  PT Problem List Decreased strength;Decreased activity tolerance;Decreased mobility;Decreased balance;Decreased coordination;Decreased cognition;Decreased knowledge of use of DME;Decreased safety awareness;Decreased knowledge of precautions;Cardiopulmonary status limiting activity;Pain       PT Treatment Interventions Gait training;DME instruction;Stair training;Functional mobility training;Therapeutic activities;Therapeutic exercise;Balance training;Neuromuscular re-education;Patient/family education;Modalities    PT Goals (Current goals can be found in the Care Plan section)  Acute Rehab PT Goals Patient Stated Goal: go home now. PT Goal Formulation: Patient unable to participate in  goal setting Time For Goal Achievement: 07/25/24 Potential to Achieve Goals: Fair    Frequency Min 2X/week     Co-evaluation PT/OT/SLP Co-Evaluation/Treatment: Yes Reason for Co-Treatment: Complexity of the patient's impairments (multi-system involvement);Necessary to address cognition/behavior during functional activity;For patient/therapist safety;To address functional/ADL transfers PT goals addressed during session: Mobility/safety with mobility;Balance;Proper use of DME OT goals addressed during session: ADL's and self-care;Strengthening/ROM       AM-PAC PT 6 Clicks Mobility  Outcome Measure Help needed turning from your back to your side while in a flat bed without using bedrails?: A Little Help needed moving from lying on your back to sitting on the side of a flat bed without using bedrails?: A Little Help needed moving to and from a bed to a chair (including a wheelchair)?: A Little Help needed standing up from a chair using your arms (e.g., wheelchair or bedside chair)?: A Little Help needed to walk in hospital room?: A Lot Help needed climbing 3-5 steps with a railing? : Total 6 Click Score: 15    End of Session Equipment Utilized During Treatment: Gait belt Activity Tolerance: Patient tolerated treatment well Patient left: in chair;with call bell/phone within reach;with chair alarm set (Waist belt alarm - educated on use.) Nurse Communication: Mobility status PT Visit Diagnosis: Unsteadiness on feet (R26.81);Other abnormalities of gait and mobility (R26.89);Muscle weakness (generalized) (M62.81);Difficulty in walking, not elsewhere classified (R26.2);Other symptoms and signs involving the nervous system (M70.101)    Time: 9172-9152 PT Time Calculation (min) (ACUTE ONLY): 20 min   Charges:   PT Evaluation $PT Eval Moderate Complexity: 1 Mod  PT General Charges $$ ACUTE PT VISIT: 1 Visit         Leontine Roads, PT, DPT [facility]  Rehabilitation  Services Physical Therapist Office: [phone] Website: [email or web address]   Leontine GORMAN Roads 07/11/2024, 12:48 PM

## 2024-07-11 NOTE — Progress Notes (Signed)
   07/11/24 1446  Spiritual Encounters  Type of Visit Initial  Care provided to: Pt and family  Referral source Clinical staff  Reason for visit Advance directives  OnCall Visit No  Spiritual Framework  Presenting Themes Impactful experiences and emotions  Community/Connection Family  Patient Stress Factors Health changes  Family Stress Factors Health changes  Interventions  Spiritual Care Interventions Made Compassionate presence;Established relationship of care and support  Intervention Outcomes  Outcomes Awareness of health;Awareness of support   Chaplain met with the Pt to discuss an Advance Directive (AD). The Pt stated he is not currently interested in completing and AD at this time. During the visit, the Pt's sister arrived and joined the conversation. 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  After consulting with the nurse, it was noticed that while the Pt is conversant with others, he is not able to completely understand  or make  complex decisions regarding his healthcare. Therefore , the Pt's sister has been present to assist him.

## 2024-07-11 NOTE — Progress Notes (Signed)
 Rounding Note   Patient Name: Nicolas Ray Date of Encounter: 07/11/2024  [facility] Cardiologist: Ozell Fell, MD ***  Subjective ***  Scheduled Meds:  arformoterol  15 mcg Nebulization BID   Chlorhexidine  Gluconate Cloth  6 each Topical Daily   clopidogrel   75 mg Per Tube Daily   docusate  100 mg Per Tube BID   insulin  aspart  0-15 Units Subcutaneous Q4H   levothyroxine   150 mcg Per Tube Q0600   nicotine  14 mg Transdermal Daily   polyethylene glycol  17 g Per Tube Daily   revefenacin  175 mcg Nebulization Daily   sodium chloride  flush  10-40 mL Intracatheter Q12H   Continuous Infusions:  amiodarone 30 mg/hr (07/11/24 0500)   heparin  1,600 Units/hr (07/11/24 0500)   PRN Meds: docusate sodium , ipratropium-albuterol, ondansetron  (ZOFRAN ) IV, mouth rinse, polyethylene glycol, sodium chloride  flush   Vital Signs  Vitals:   07/11/24 0500 07/11/24 0600 07/11/24 0700 07/11/24 0800  BP: (!) 140/91 (!) 141/83 137/73   Pulse:  88    Resp: (!) 32 19 (!) 25 (!) 21  Temp:      TempSrc:      SpO2: 97% 97%    Weight:      Height:        Intake/Output Summary (Last 24 hours) at 07/11/2024 0909 Last data filed at 07/11/2024 0800 Gross per 24 hour  Intake 754.77 ml  Output 1535 ml  Net -780.23 ml      07/11/2024    4:29 AM 07/10/2024    6:29 AM 07/09/2024    4:36 AM  Last 3 Weights  Weight (lbs) 176 lb 9.4 oz 182 lb 1.6 oz 187 lb 6.3 oz  Weight (kg) 80.1 kg 82.6 kg 85 kg      Telemetry *** - Personally Reviewed  ECG  *** - Personally Reviewed  Physical Exam *** GEN: No acute distress.   Neck: No JVD Cardiac: RRR, no murmurs, rubs, or gallops.  Respiratory: Clear to auscultation bilaterally. GI: Soft, nontender, non-distended  MS: No edema; No deformity. Neuro:  Nonfocal  Psych: Normal affect   Labs High Sensitivity Troponin:   Recent Labs  Lab 07/08/24 0628 07/08/24 0825 07/09/24 0159 07/09/24 0653 07/09/24 0844  TROPONINIHS 15 18*  355* 1,888* 2,227*     Chemistry Recent Labs  Lab 07/09/24 0159 07/09/24 0200 07/09/24 0509 07/10/24 0219 07/11/24 0425  NA  --  135 134* 134* 136  K  --  4.4 4.7 4.3 3.9  CL  --  100  --  98 96*  CO2  --  19*  --  20* 21*  GLUCOSE  --  223*  --  154* 138*  BUN  --  51*  --  59* 69*  CREATININE  --  3.06*  --  4.06* 5.07*  CALCIUM   --  8.8*  --  8.6* 8.5*  MG 3.0*  --   --  2.4 2.3  PROT  --   --   --  7.1  --   ALBUMIN  --   --   --  3.4*  --   AST  --   --   --  23  --   ALT  --   --   --  23  --   ALKPHOS  --   --   --  66  --   BILITOT  --   --   --  1.1  --   GFRNONAA  --  21*  --  15* 11*  ANIONGAP  --  16*  --  16* 19*    Lipids  Recent Labs  Lab 07/09/24 0159  TRIG 60    Hematology Recent Labs  Lab 07/08/24 1100 07/08/24 2253 07/09/24 0509 07/10/24 0219 07/11/24 0425  WBC 9.1  --   --  15.6* 15.9*  RBC 5.75  --   --  6.06* 5.69  HGB 12.6*   < > 13.9 13.1 12.4*  HCT 40.2   < > 41.0 41.3 38.8*  MCV 69.9*  --   --  68.2* 68.2*  MCH 21.9*  --   --  21.6* 21.8*  MCHC 31.3  --   --  31.7 32.0  RDW 14.8  --   --  14.6 14.7  PLT 179  --   --  163 145*   < > = values in this interval not displayed.   Thyroid   Recent Labs  Lab 07/08/24 2014  TSH 2.112    BNP Recent Labs  Lab 07/08/24 0628  BNP 648.0*    DDimer No results for input(s): DDIMER in the last 168 hours.   Radiology  US  EKG SITE RITE Result Date: 07/10/2024 If Site Rite image not attached, placement could not be confirmed due to current cardiac rhythm.  CT HEAD WO CONTRAST ( ) Result Date: 07/10/2024 EXAM: CT HEAD WITHOUT CONTRAST 07/09/2024 10:22:40 PM TECHNIQUE: CT of the head was performed without the administration of intravenous contrast. Automated exposure control, iterative reconstruction, and/or weight based adjustment of the mA/kV was utilized to reduce the radiation dose to as low as reasonably achievable. COMPARISON: None available. CLINICAL HISTORY: Neuro deficit, acute,  stroke suspected FINDINGS: BRAIN AND VENTRICLES: No acute hemorrhage. No evidence of acute large vascular territory infarct. No hydrocephalus. No extra-axial collection. No mass effect or midline shift. Moderate patchy white matter hypodensities are compatible with chronic microvascular ischemic change. ORBITS: No acute abnormality. SINUSES: Partially opacified left maxillary and right sphenoid sinuses. SOFT TISSUES AND SKULL: No acute soft tissue abnormality. No skull fracture. IMPRESSION: 1. No acute intracranial abnormality. 2. Moderate chronic microvascular ischemic change. Electronically signed by: Gilmore Molt MD 07/10/2024 12:13 AM EST RP Workstation: [id number]   ECHOCARDIOGRAM COMPLETE Result Date: 07/09/2024    ECHOCARDIOGRAM REPORT   Patient Name:   Nicolas Ray Date of Exam: 07/09/2024 Medical Rec #:  [id number]   Height:       68.0 in Accession #:    [id number]  Weight:       187.4 lb Date of Birth:  08-09-1951    BSA:          1.988 m Patient Age:    73 years    BP:           83/57 mmHg Patient Gender: M           HR:           63 bpm. Exam Location:  Inpatient Procedure: 2D Echo, Color Doppler and Cardiac Doppler (Both Spectral and Color            Flow Doppler were utilized during procedure). Indications:    CHF-Acute Systolic  History:        Patient has prior history of Echocardiogram examinations, most                 recent 01/24/2021. Ischemic Cardiomyopathy, Acute MI, CAD and  SETMI; Risk Factors:Obstructive Sleep Apnea and Current Smoker.                 Hypothyroidism.  Sonographer:    Logan Shove RDCS Referring Phys: [id number] CALLIE E GOODRICH IMPRESSIONS  1. Left ventricular ejection fraction, by estimation, is 20 to 25%. The left ventricle has severely decreased function. The left ventricle demonstrates global hypokinesis. The left ventricular internal cavity size was mildly dilated. There is mild left ventricular hypertrophy. Left ventricular diastolic parameters are  indeterminate. Elevated left atrial pressure.  2. Right ventricular systolic function is normal. The right ventricular size is normal.  3. Left atrial size was mildly dilated.  4. Right atrial size was mildly dilated.  5. The mitral valve is normal in structure. No evidence of mitral valve regurgitation. No evidence of mitral stenosis.  6. Low flow low gradient moderate aortic stenosis. Mean grad 6, AVA VTI 1.34, DI 0.39, SVI 23. . The aortic valve was not well visualized. There is moderate calcification of the aortic valve. There is moderate thickening of the aortic valve. Aortic valve regurgitation is not visualized. Mild aortic valve stenosis. Aortic valve area, by VTI measures 1.34 cm. Aortic valve mean gradient measures 6.0 mmHg. FINDINGS  Left Ventricle: Left ventricular ejection fraction, by estimation, is 20 to 25%. The left ventricle has severely decreased function. The left ventricle demonstrates global hypokinesis. The left ventricular internal cavity size was mildly dilated. There is mild left ventricular hypertrophy. Left ventricular diastolic parameters are indeterminate. Elevated left atrial pressure. Right Ventricle: The right ventricular size is normal. Right vetricular wall thickness was not well visualized. Right ventricular systolic function is normal. Left Atrium: Left atrial size was mildly dilated. Right Atrium: Right atrial size was mildly dilated. Pericardium: There is no evidence of pericardial effusion. Mitral Valve: The mitral valve is normal in structure. There is mild thickening of the mitral valve leaflet(s). There is mild calcification of the mitral valve leaflet(s). Mild mitral annular calcification. No evidence of mitral valve regurgitation. No evidence of mitral valve stenosis. Tricuspid Valve: The tricuspid valve is normal in structure. Tricuspid valve regurgitation is not demonstrated. No evidence of tricuspid stenosis. Aortic Valve: Low flow low gradient moderate aortic  stenosis. Mean grad 6, AVA VTI 1.34, DI 0.39, SVI 23. The aortic valve was not well visualized. There is moderate calcification of the aortic valve. There is moderate thickening of the aortic valve. There is moderate aortic valve annular calcification. Aortic valve regurgitation is not visualized. Mild aortic stenosis is present. Aortic valve mean gradient measures 6.0 mmHg. Aortic valve peak gradient measures 12.7 mmHg. Aortic valve area, by VTI measures 1.34 cm. Pulmonic Valve: The pulmonic valve was not well visualized. Pulmonic valve regurgitation is not visualized. No evidence of pulmonic stenosis. Aorta: The aortic root and ascending aorta are structurally normal, with no evidence of dilitation. IAS/Shunts: No atrial level shunt detected by color flow Doppler.  LEFT VENTRICLE PLAX 2D LVIDd:         6.10 cm      Diastology LVIDs:         5.60 cm      LV e' medial:    5.00 cm/s LV PW:         1.10 cm      LV E/e' medial:  20.4 LV IVS:        1.20 cm      LV e' lateral:   8.70 cm/s LVOT diam:     2.10 cm  LV E/e' lateral: 11.7 LV SV:         45 LV SV Index:   23 LVOT Area:     3.46 cm LV IVRT:       224 msec  LV Volumes (MOD) LV vol d, MOD A2C: 205.0 ml LV vol d, MOD A4C: 167.0 ml LV vol s, MOD A2C: 165.0 ml LV vol s, MOD A4C: 135.0 ml LV SV MOD A2C:     40.0 ml LV SV MOD A4C:     167.0 ml LV SV MOD BP:      39.3 ml RIGHT VENTRICLE            IVC RV Basal diam:  3.10 cm    IVC diam: 1.90 cm RV S prime:     9.79 cm/s TAPSE (M-mode): 2.1 cm     PULMONARY VEINS                            Diastolic Velocity: 36.10 cm/s                            S/D Velocity:       0.70                            Systolic Velocity:  24.20 cm/s LEFT ATRIUM              Index        RIGHT ATRIUM           Index LA diam:        3.40 cm  1.71 cm/m   RA Area:     17.40 cm LA Vol (A2C):   52.0 ml  26.16 ml/m  RA Volume:   43.70 ml  21.98 ml/m LA Vol (A4C):   109.0 ml 54.83 ml/m LA Biplane Vol: 83.0 ml  41.75 ml/m  AORTIC VALVE  AV Area (Vmax):    1.38 cm AV Area (Vmean):   1.39 cm AV Area (VTI):     1.34 cm AV Vmax:           178.00 cm/s AV Vmean:          115.000 cm/s AV VTI:            0.336 m AV Peak Grad:      12.7 mmHg AV Mean Grad:      6.0 mmHg LVOT Vmax:         70.90 cm/s LVOT Vmean:        46.300 cm/s LVOT VTI:          0.130 m LVOT/AV VTI ratio: 0.39  AORTA Ao Root diam: 3.10 cm Ao Asc diam:  3.10 cm MITRAL VALVE MV Area (PHT): 3.60 cm     SHUNTS MV Decel Time: 211 msec     Systemic VTI:  0.13 m MV E velocity: 102.00 cm/s  Systemic Diam: 2.10 cm MV A velocity: 93.80 cm/s MV E/A ratio:  1.09 Dorn Ross MD Electronically signed by Dorn Ross MD Signature Date/Time: 07/09/2024/1:23:21 PM    Final     Cardiac Studies  Echo 07/09/24:  1. Left ventricular ejection fraction, by estimation, is 20 to 25%. The  left ventricle has severely decreased function. The left ventricle  demonstrates global hypokinesis. The left ventricular internal cavity size  was mildly dilated. There is mild left  ventricular hypertrophy. Left ventricular diastolic parameters are  indeterminate. Elevated left atrial pressure.   2. Right ventricular systolic function is normal. The right ventricular  size is normal.   3. Left atrial size was mildly dilated.   4. Right atrial size was mildly dilated.   5. The mitral valve is normal in structure. No evidence of mitral valve  regurgitation. No evidence of mitral stenosis.   6. Low flow low gradient moderate aortic stenosis. Mean grad 6, AVA VTI  1.34, DI 0.39, SVI 23. . The aortic valve was not well visualized. There  is moderate calcification of the aortic valve. There is moderate  thickening of the aortic valve. Aortic  valve regurgitation is not visualized. Mild aortic valve stenosis. Aortic  valve area, by VTI measures 1.34 cm. Aortic valve mean gradient measures  6.0 mmHg.   LHC/RHC 12/2022:   1.  Continued patency of the left main 2.  Continued patency of the LAD and  diagonal stents with mild diffuse in-stent restenosis in both vessels, moderate distal edge restenosis in the LAD and the diagonal 3.  Stable but severe stenosis of a small first OM branch and total occlusion of the mid left circumflex which is now collateralized via left to right collaterals filling the distal OM 4.  Continued patency of the RCA stent with mild diffuse in-stent restenosis and mild to moderate distal RCA and proximal PDA stenoses 5.  Normal LVEDP of 12 mmHg, normal wedge pressure of 10 mmHg, then well compensated hemodynamics with normal right-sided diastolic filling pressures and preserved cardiac output of 7.4 L/min with cardiac index of 3.6 L/min/m   Recommendations: Continued medical therapy  Patient Profile   72 y.o. 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  Assessment & Plan   # VF Arrest: Patient inititally admitted with hypertensive emergency and flash pulmonary edema.  While in the ED he had VF/torsades and was immediately resuscitated.  He has subsequently been started on amiodarone.  Maintain K>4, Mg>2.  Neither were low at the time of his arrest.   # HFrEF:  Last echo 10/2022 revealed VLEF 15-20%.  Grade 3 diastolic dysfunction.  Multiple medication intolerances, CKD, and non-adherence have limited GDMT titration.    # HTN:  Initial BP 220 with EMS.  Thought to have had flash pulmonary edema.    # CAD:  S/p STEMI.  Refused CABG and underwent multivessel PCI in 2016.    # Acute on chronic renal failure:  Creatinine continues to rise.  5 from 2.83 on admission.  Declines HD.    {Are we signing off today?:[id number]} For questions or updates, please contact [facility] Please consult [email or web address] for contact info under     {TIP  Split Shared Billing  Do NOT delete any part  of this including brackets If split shared billing is based upon MDM, disregard If billing will be based upon TIME you MUST document the number of minutes and a detailed list of what was done in that time in the following format Example - I spent ** minutes seeing this patient. During that time I reviewed their history, evaluated their symptoms, reviewed available labs, EKGs, studies, performed an exam and formulated an assessment and plan   :1} {Select this only if you need to document critical care time (Optional):[id number]} Signed, Annabella Scarce, MD  07/11/2024, 9:09 AM

## 2024-07-11 NOTE — Progress Notes (Signed)
 Palliative Medicine Inpatient Follow Up Note HPI: 73 year old male patient with a past medical history of STEMI in August 2016 status post DES 21 and staged PCI with DES to LAD and RCA complicated by ischemic cardiomyopathy with EF 15 to 20% in March 2024, hypertension, hyperlipidemia, type 2 diabetes mellitus and CKD stage IV.    [facility] on 11/7 for evaluation of shortness of breath. Course complicated by V-fib arrest in the ED requiring 1 round CPR 1 shock for ROSC subsequently intubation mechanical ventilation.    Palliative care has been asked to support additional goals of care conversations.   Today's Discussion 07/11/2024  *Please note that this is a verbal dictation therefore any spelling or grammatical errors are due to the Dragon Medical One system interpretation.  Chart reviewed inclusive of vital signs, progress notes, laboratory results Na 136, K 3.9, Chl 96, CO2 21, Glu 138, BUN 69, Cr 5.07, Ca 8.5, Anion Gap 19, Mag 2.3, GFR 11 and diagnostic images.   I spoke with Ronnald Gave - CCM APP this morning who shares with me that Lovett will be transitioning out of the ICU. She shares that the critical care concerns have been optimized.   I met with Brevon at bedside with his nephew Georgiann. He is awake and alert sitting up in the recliner chair. He is not in any distress this morning. He is verbal but in-attentive and continues to ask to get out of the chair. He is redirected frequently.  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   Discussed with patients nephew ICU associated delirium. Reviewed that ICU-induced delirium is a state of acute confusion and disorientation that develops in critically ill patients admitted to an intensive care unit (ICU). We discussed the possible improvements that can take place once patients are removed from the ICU setting.   Questions and concerns addressed/Palliative Support Provided.   Objective Assessment: Vital Signs Vitals:    07/11/24 1000 07/11/24 1100  BP:    Pulse:    Resp: (!) 27 (!) 26  Temp:    SpO2:      Intake/Output Summary (Last 24 hours) at 07/11/2024 1131 Last data filed at 07/11/2024 1100 Gross per 24 hour  Intake 868.47 ml  Output 1377 ml  Net -508.53 ml   Last Weight  Most recent update: 07/11/2024  4:29 AM    Weight  80.1 kg (176 lb 9.4 oz)            Gen: Frail elderly AA M chronically ill appearing HEENT: Dry mucous membranes CV: Regular rate and rhythm  PULM: 4L [place], breathing slightly labored ABD: soft/nontender  EXT: BLE edema  Neuro: Alert and oriented x2  SUMMARY OF RECOMMENDATIONS   DNAR/DNI, patient does not feel he would want dialysis   Patient and his sister made aware of his tenuous state and the potential for rapid decline(s) --> We discussed comfort measures as an option if Timon should neglect to improve   Plan to continue present measures to see if improvements could be made   Patients goal is to live to see his first grandchild who is due in January 2026  Appreciate Chaplain helping with HCPOA documents  Ongoing PMT support  ______________________________________________________________________________________ Rosaline Becton [facility] Palliative Medicine Team Team Cell Phone: [phone] Please utilize secure chat with additional questions, if there is no response within 30 minutes please call the above phone number  Billing based on MDM: Moderate   Palliative Medicine Team  providers are available by phone from 7am to 7pm daily and can be reached through the team cell phone.  Should this patient require assistance outside of these hours, please call the patient's attending physician.

## 2024-07-11 NOTE — Progress Notes (Signed)
 Advanced Heart Failure Rounding Note  Cardiologist: Nicolas Fell, MD  Chief Complaint: VF arrest / A/C HFrEF Subjective:    Resting in bed. Slightly confused and pulling off his gown. RN at bedside.   Denies CP/SOB.   Objective:   Weight Range: 80.1 kg Body mass index is 26.85 kg/m.   Vital Signs:   Temp:  [98 F (36.7 C)-99.6 F (37.6 C)] 98.4 F (36.9 C) (11/10 0000) Pulse Rate:  [70-94] 88 (11/10 0600) Resp:  [18-32] 26 (11/10 1100) BP: (107-141)/(62-91) 137/73 (11/10 0700) SpO2:  [93 %-98 %] 97 % (11/10 0600) Weight:  [80.1 kg] 80.1 kg (11/10 0429) Last BM Date : 07/09/24  Weight change: Filed Weights   07/09/24 0436 07/10/24 0629 07/11/24 0429  Weight: 85 kg 82.6 kg 80.1 kg    Intake/Output:   Intake/Output Summary (Last 24 hours) at 07/11/2024 1118 Last data filed at 07/11/2024 1100 Gross per 24 hour  Intake 868.47 ml  Output 1377 ml  Net -508.53 ml    Physical Exam  General:  chronically ill appearing.  No respiratory difficulty Neck: JVD ~6 cm.  Cor: Regular rate & rhythm. No murmurs. Lungs: clear, diminished bases Extremities: no edema  Neuro: redirectable but slightly confused.   Telemetry   NSR 80s-90s, short NSVT runs rare (Personally reviewed)    Labs    CBC Recent Labs    07/10/24 0219 07/11/24 0425  WBC 15.6* 15.9*  HGB 13.1 12.4*  HCT 41.3 38.8*  MCV 68.2* 68.2*  PLT 163 145*   Basic Metabolic Panel Recent Labs    88/90/74 0219 07/11/24 0425  NA 134* 136  K 4.3 3.9  CL 98 96*  CO2 20* 21*  GLUCOSE 154* 138*  BUN 59* 69*  CREATININE 4.06* 5.07*  CALCIUM  8.6* 8.5*  MG 2.4 2.3   Liver Function Tests Recent Labs    07/10/24 0219  AST 23  ALT 23  ALKPHOS 66  BILITOT 1.1  PROT 7.1  ALBUMIN 3.4*   No results for input(s): LIPASE, AMYLASE in the last 72 hours. Cardiac Enzymes No results for input(s): CKTOTAL, CKMB, CKMBINDEX, TROPONINI in the last 72 hours.  BNP: BNP (last 3 results) Recent  Labs    07/08/24 0628  BNP 648.0*    ProBNP (last 3 results) No results for input(s): PROBNP in the last 8760 hours.   D-Dimer No results for input(s): DDIMER in the last 72 hours. Hemoglobin A1C Recent Labs    07/08/24 2014  HGBA1C 5.9*   Fasting Lipid Panel Recent Labs    07/09/24 0159  TRIG 60   Thyroid  Function Tests Recent Labs    07/08/24 2014  TSH 2.112    Other results:   Imaging    No results found.   Medications:     Scheduled Medications:  amiodarone  400 mg Oral Daily   arformoterol  15 mcg Nebulization BID   Chlorhexidine  Gluconate Cloth  6 each Topical Daily   docusate  100 mg Oral BID   [START ON 07/12/2024] heparin  injection (subcutaneous)  5,000 Units Subcutaneous Q8H   insulin  aspart  0-15 Units Subcutaneous TID WC   [START ON 07/12/2024] levothyroxine   150 mcg Oral Q0600   nicotine  14 mg Transdermal Daily   [START ON 07/12/2024] polyethylene glycol  17 g Oral Daily   revefenacin  175 mcg Nebulization Daily   sodium chloride  flush  10-40 mL Intracatheter Q12H    Infusions:   PRN Medications: docusate sodium , ipratropium-albuterol,  ondansetron  (ZOFRAN ) IV, mouth rinse, polyethylene glycol, sodium chloride  flush    Patient Profile   Nicolas Ray is a 73 y.o. male with a history of CAD with STEMI in 04/2015 s/p DES to D1 and staged PCI with DES to LAD and RCA, ischemic cardiomyopathy/ chronic HFrEF with EF of 15-20% in 10/2022, hypertension, hyperlipidemia, type 2 diabetes mellitus, CKD stage IV, right hemidiaphragm paralysis, hypothyroidism, obstructive sleep apnea, tobacco use, and non-compliance. Admitted with VF arrest and A/C HFrEF.   Assessment/Plan   VF arrest - VF arrest 11/8. Required CPR and shock.  - No further VT, short runs of NSVT - Keep K>4 and Mg >2 - stop amiodarone gtt, transition to PO 400 mg daily  A/C HFrEF - Echo this admission with EF 20-25%.  - Still making decent UOP with AKI. Got 80 IV lasix  this  am per primary. CVP 9.  - Hold off on further diuresis. Palliative team following to discuss possible iHD. Has previously refused multiple interventions.  - GDMT limited with AKI/CKD  CAD - Previously refused CABG. Got PCI/DES to mid LAD and DES to prox RCA  - LHC 24 with patency of LM/LAD, severe stenosis of small OM1, 100% Lcx - Denies CP - HsTrop [phone]. Suspect rise post arrest - Stop hep gtt now 48 hrs  - On Plavix  monotherapy PTA with hx of GIB. Will restart once no further interventions  - Start statin  AKI on CKD - Scr up to 5.07 today. Baseline 2.2-2.5 since being here - Palliative following for GOC - Diuresis per primary. Would hold off on further diuretics after morning dose. Did not seem overly volume overloaded on exam.   Plan to transfer out of ICU today. He does not need f/u in AHF clinic at this time. [facility] to follow.   Length of Stay: 3  Nicolas LITTIE Coe, NP  07/11/2024, 11:18 AM  Advanced Heart Failure Team Pager [phone] (M-F; 7a - 5p)  Please contact [facility] for night-coverage after hours (5p -7a ) and weekends on [email or web address]

## 2024-07-11 NOTE — Progress Notes (Addendum)
 eLink Physician-Brief Progress Note Patient Name: Nicolas Ray DOB: [birth year] MRN: [id number]   Date of Service  07/11/2024  HPI/Events of Note  Notified of patient agitation.  Pt is awake but very confused.  He is pulling at lines and very aggressive with nursing staff. Several nurses are holding the patient down.  Qtc 478   eICU Interventions  Give haldol 5mg  IV now.  Ok to apply wrist restraints. Pt still attempting to pull at restraints.  Give ativan 1mg  IV now.      Intervention Category Minor Interventions: Agitation / anxiety - evaluation and management  Shyne Lehrke 07/11/2024, 2:04 AM

## 2024-07-11 NOTE — Progress Notes (Signed)

## 2024-07-11 NOTE — Progress Notes (Signed)
 Heart Failure Navigator Progress Note  Assessed for Heart & Vascular TOC clinic readiness.  Patient does not meet criteria due to Advanced Heart Failure Team consulted..   Navigator will sign off at this time.    Stephane Haddock, BSN, Scientist, clinical (histocompatibility and immunogenetics) Only

## 2024-07-11 NOTE — TOC Initial Note (Signed)
 Transition of Care [facility]) - Initial/Assessment Note    Patient Details  Name: Nicolas Ray MRN: [id number] Date of Birth: [birth year]  Transition of [facility]) CM/SW Contact:    Justina Delcia Czar, RN Phone Number: [phone] 07/11/2024, 5:13 PM  Clinical Narrative:                 Spoke to pt and sisters at bedside. Pt was independent pta. States he does not use DME.  Sisters are wanting IP rehab vs SNF rehab.    Waiting PT/OT evaluation and recommendations.   Expected Discharge Plan: Home/Self Care Barriers to Discharge: Continued Medical Work up   Patient Goals and CMS Choice Patient states their goals for this hospitalization and ongoing recovery are:: wants to remain independent   Expected Discharge Plan and Services   Discharge Planning Services: CM Consult   Living arrangements for the past 2 months: Single Family Home                   Prior Living Arrangements/Services Living arrangements for the past 2 months: Single Family Home Lives with:: Siblings Patient language and need for interpreter reviewed:: Yes Do you feel safe going back to the place where you live?: Yes      Need for Family Participation in Patient Care: Yes (Comment) Care giver support system in place?: Yes (comment)   Criminal Activity/Legal Involvement Pertinent to Current Situation/Hospitalization: No - Comment as needed  Activities of Daily Living   ADL Screening (condition at time of admission) Independently performs ADLs?: Yes (appropriate for developmental age) Is the patient deaf or have difficulty hearing?: No Does the patient have difficulty seeing, even when wearing glasses/contacts?: No Does the patient have difficulty concentrating, remembering, or making decisions?: No  Permission Sought/Granted Permission sought to share information with : Case Manager, Family Supports, PCP Permission granted to share information with : Yes, Verbal Permission Granted  Share Information with  NAME: Bob Daversa  Permission granted to share info w AGENCY: PCP, DME  Permission granted to share info w Relationship: sister  Permission granted to share info w Contact Information: [phone]  Emotional Assessment Appearance:: Appears stated age Attitude/Demeanor/Rapport: Engaged   Orientation: : Fluctuating Orientation (Suspected and/or reported Sundowners)   Psych Involvement: No (comment)  Admission diagnosis:  Acute on chronic heart failure (HCC) [I50.9] Acute respiratory failure with hypoxia (HCC) [J96.01] Cardiac arrest with ventricular fibrillation (HCC) [I46.9, I49.01] Acute congestive heart failure, unspecified heart failure type (HCC) [I50.9] Patient Active Problem List   Diagnosis Date Noted   Acute kidney injury 07/11/2024   On mechanically assisted ventilation (HCC) 07/09/2024   Acute respiratory failure with hypoxia (HCC) 07/09/2024   VF (ventricular fibrillation) (HCC) 07/09/2024   Acute on chronic HFrEF (heart failure with reduced ejection fraction) (HCC) 07/09/2024   Acute congestive heart failure (HCC) 07/08/2024   Cardiac arrest with ventricular fibrillation (HCC) 07/08/2024   Esophagitis 10/09/2023   Helicobacter pylori gastritis 10/09/2023   Screening for colon cancer 10/09/2023   Obstructive and reflux uropathy, unspecified 09/21/2023   Acute blood loss anemia 08/03/2023   Chronic gastritis 07/26/2023   Duodenal ulcer 07/18/2023   Platelet inhibition due to Plavix  07/18/2023   GI bleed 07/17/2023   Right nephrolithiasis 07/14/2023   Viral URI with cough 07/14/2023   Nut allergy 06/01/2023   Gross hematuria 06/01/2023   Primary insomnia 06/01/2023   HFrEF (heart failure with reduced ejection fraction) (HCC) 12/09/2022   Ischemic cardiomyopathy 11/19/2022   Peripheral polyneuropathy  12/02/2021   CKD (chronic kidney disease) stage 4, GFR 15-29 ml/min (HCC) 08/08/2021   Hyperlipidemia, unspecified 08/08/2021   Hypertensive heart disease with  heart failure (HCC) 08/08/2021   Lower urinary tract symptoms 08/08/2021   Smoker 08/08/2021   Stented coronary artery 08/08/2021   History of ST elevation myocardial infarction (STEMI), of lateral wall 08/08/2021   OSA (obstructive sleep apnea) 09/21/2018   BPH with urinary obstruction 04/14/2016   Bilateral hydronephrosis 01/08/2016   Urinary retention 01/08/2016   Coronary artery disease involving coronary bypass graft of native heart without angina pectoris    Type 2 diabetes mellitus with diabetic neuropathy, unspecified (HCC) 04/19/2015   Chronic systolic heart failure (HCC) 04/19/2015   Other male erectile dysfunction 04/18/2014   Sciatica 03/15/2013   Hypothyroidism 02/08/2013   Essential hypertension 02/08/2013   Depression 01/25/2013   Low back pain 01/25/2013   PCP:  Thedora Garnette HERO, MD Pharmacy:   [facility] [phone] [place] [place], [place] - [address] AT [facility] [address] [place] [address] Phone: [phone] Fax: [phone]  [id number] [place] [facility] - [address] [place] [address] Phone: [phone] Fax: [phone]     Social Drivers of Health (SDOH) Social History: SDOH Screenings   Food Insecurity: No Food Insecurity (07/08/2024)  Housing: Low Risk  (07/08/2024)  Transportation Needs: No Transportation Needs (07/08/2024)  Utilities: Not At Risk (07/08/2024)  Depression (PHQ2-9): Low Risk  (06/01/2023)  Social Connections: Moderately Isolated (07/08/2024)  Tobacco Use: High Risk (07/09/2024)   SDOH Interventions:     Readmission Risk Interventions     No data to display

## 2024-07-11 NOTE — Progress Notes (Addendum)
 NAMEPRINSTON KYNARD, MRN:  [id number], DOB:  [birth year], LOS: 3 ADMISSION DATE:  07/08/2024 History of Present Illness:  This is a 73 year old male patient with a past medical history of STEMI in August 2016 status post DES 21 and staged PCI with DES to LAD and RCA complicated by ischemic cardiomyopathy with EF 15 to 20% in March 2024, hypertension, hyperlipidemia, type 2 diabetes mellitus and CKD stage IV presenting to [facility] ED on 11/7 for evaluation of shortness of breath.  Course complicated by V-fib arrest in the ED requiring 1 round CPR 1 shock for ROSC subsequently intubation mechanical ventilation.  Admitted to the ICU for further management.  Patient initially presented to the emergency department with worsening shortness of breath for few days.  Suddenly became worse the night prior to admission.  Also described orthopnea.  He called EMS and was noted to be hypoxic.  Brought into the emergency department on CPAP.  He was found to be in CHF exacerbation with an elevated BNP at 648, chest x-ray with pulmonary vascular congestion.  He was placed on BiPAP and given IV Lasix .  Unfortunately around 10 PM patient noted to be in V-fib arrest.  Requiring 1 round to ROSC.  1 epi, 1 shock.  Subsequently bolused with amnio and started on amnio drip.  Given 2 g of mag IV.  No return of consciousness post ROSC.  Intubated in the ED.  Post ROSC EKG without any convincing ST elevation.  Cardiology at bedside and recommends supportive care.  No intervention at this time given mental status.   Significant Hospital Events: Including procedures, antibiotic start and stop dates in addition to other pertinent events   07/09/2024 - Admitted to the ICU post In hospital v.fib arrest.  11/9 extubated. Seen by palli care, DNR/I  11/10 txf out of ICU   Subjective:    Extubated yesterday Some agitation overnight   HDS   Cr up  to 5.07, 1.4L UOP after 160 lasix     Objective    Blood pressure 137/73,  pulse 88, temperature 98.4 F (36.9 C), temperature source Oral, resp. rate (!) 23, height 5' 8 (1.727 m), weight 80.1 kg, SpO2 97%.        Intake/Output Summary (Last 24 hours) at 07/11/2024 9062 Last data filed at 07/11/2024 0800 Gross per 24 hour  Intake 754.77 ml  Output 1535 ml  Net -780.23 ml   Filed Weights   07/09/24 0436 07/10/24 0629 07/11/24 0429  Weight: 85 kg 82.6 kg 80.1 kg    Examination: General: chronically and acutely ill older adult M NAD  Neuro: AAOx3. Intermittently confused. Following commands, no focal def HEENT: NCAT pink mm   CV: rr s1s2 cap refill < 3 sec  PULM: even unlabored, ctab  GI: soft ndnt hypoactive  Extremities: no obvious acute joint deformity no cyanosis or clubbing     Assessment and Plan   Acute encephalopathy, improving  P -delirium precautions -limit CNS depressing meds as able   VF/VT arrest AoC HFrEF ICM Mild AS CAD HTN HLD P -amio  -hep gtt -- would rec keeping for a few days in case renal fxn declines and pt changes his mind about interest in HD, but in d/w palliative I dont think this is likely   -add on mag, optimize lytes -plavix  / heparin  at present  -repeat lasix  -- will give 80 this morning and assess response. Rcvd 160 11/10   Acute resp failure w hypoxia Tobacco use  disorder -extubated 11/9 P -pulm hygiene, IS, mobility -as above, diurese  -wean O2 for goal > 90 -can continue nebs for now but not sure he will need to dc w these -cont nicotine patch, encourage tobacco cessation   AKI on CKD IV -in d.w palliative, has said he doesn't think he would want HD P -follow renal indices, UOP -dc foley  -as above, diurese   DM2 -SSI. If PO intake is good can change to ACHS from q4   Hypothyroidism  -cont synthroid    Hx medication noncompliance  -needs ongoing support closer to dc   DNR GOC -palliative care is following for ongoing GOC   Dispo: -stable to txf out of icu 11/10. Will ask [facility] to  take over care starting 11/11    Critical care time:    High MDM  Ronnald Gave MSN, AGACNP-BC [facility] Pulmonary/Critical Care Medicine Amion for pager 07/11/2024, 9:37 AM

## 2024-07-11 NOTE — Evaluation (Signed)
 Occupational Therapy Evaluation Patient Details Name: Nicolas Ray MRN: [id number] DOB: 12/26/1950 Today's Date: 07/11/2024   History of Present Illness   73 y/o M presetnign to ED on 07-24-24 with difficulty breathing, pt coded with v-fib arrest 24-Jul-2024, intubated. Extubated 11/9. Admitted for acute CHF.    PMH includes tobacco use, STEMI, CAD s/p PCI, ICM , HFrEF, Dm2, hypothyroidism, GIB, OSA, elevated BNP, BLE edema     Clinical Impressions Pt ind at baseline with ADL/mobility, was living with his sister PTA, pt needs up to mod A for ADLs, min A for transfers with HHA. Pt with impaired cognition, unclear how different from baseline, but is oriented to self and restless/impulsive behaviors noted during session, overall decr awareness of deficits. Pt ambulating hallway distance with mild instability but corrects self with minA. Pt presenting with impairments listed below, will follow acutely. Patient will benefit from intensive inpatient follow-up therapy, >3 hours/day.      If plan is discharge home, recommend the following:   A little help with walking and/or transfers;A lot of help with bathing/dressing/bathroom;Direct supervision/assist for medications management;Direct supervision/assist for financial management;Help with stairs or ramp for entrance;Supervision due to cognitive status;Assist for transportation;Assistance with cooking/housework     Functional Status Assessment   Patient has had a recent decline in their functional status and demonstrates the ability to make significant improvements in function in a reasonable and predictable amount of time.     Equipment Recommendations   BSC/3in1     Recommendations for Other Services   PT consult     Precautions/Restrictions   Precautions Precautions: Fall Restrictions Weight Bearing Restrictions Per Provider Order: No     Mobility Bed Mobility               General bed mobility comments: OOB in chair upon  arrival and departure    Transfers Overall transfer level: Needs assistance Equipment used: 1 person hand held assist Transfers: Sit to/from Stand Sit to Stand: Min assist           General transfer comment: mild instability/sway to R but is able to self correct      Balance Overall balance assessment: Needs assistance Sitting-balance support: Feet supported Sitting balance-Leahy Scale: Good     Standing balance support: During functional activity Standing balance-Leahy Scale: Fair Standing balance comment: sway to R/instability wtih hall ambulation                           ADL either performed or assessed with clinical judgement   ADL Overall ADL's : Needs assistance/impaired Eating/Feeding: Set up   Grooming: Minimal assistance;Standing;Sitting   Upper Body Bathing: Minimal assistance;Sitting   Lower Body Bathing: Moderate assistance   Upper Body Dressing : Minimal assistance   Lower Body Dressing: Moderate assistance   Toilet Transfer: Minimal assistance;+2 for physical assistance   Toileting- Clothing Manipulation and Hygiene: Minimal assistance       Functional mobility during ADLs: Minimal assistance;+2 for physical assistance       Vision   Vision Assessment?: No apparent visual deficits     Perception Perception: Not tested       Praxis Praxis: Not tested       Pertinent Vitals/Pain Pain Assessment Pain Assessment: No/denies pain     Extremity/Trunk Assessment Upper Extremity Assessment Upper Extremity Assessment: Generalized weakness   Lower Extremity Assessment Lower Extremity Assessment: Defer to PT evaluation   Cervical / Trunk Assessment Cervical / Trunk  Assessment: Normal   Communication Communication Communication: No apparent difficulties   Cognition Arousal: Alert Behavior During Therapy: Restless, Impulsive Cognition: Cognition impaired   Orientation impairments: Situation, Time, Place Awareness:  Online awareness impaired, Intellectual awareness impaired Memory impairment (select all impairments): Short-term memory Attention impairment (select first level of impairment): Sustained attention Executive functioning impairment (select all impairments): Problem solving, Reasoning                   Following commands: Intact       Cueing  General Comments      VSS   Exercises     Shoulder Instructions      Home Living Family/patient expects to be discharged to:: Private residence Living Arrangements: Other relatives (sister) Available Help at Discharge: Family Type of Home: House Home Access: Level entry     Home Layout: One level     Bathroom Shower/Tub: Runner, Broadcasting/film/video: None          Prior Functioning/Environment Prior Level of Function : Independent/Modified Independent;Driving;Working/employed                    OT Problem List: Decreased strength;Decreased range of motion;Decreased activity tolerance;Impaired balance (sitting and/or standing);Decreased cognition;Decreased safety awareness   OT Treatment/Interventions: Self-care/ADL training;Therapeutic exercise;Energy conservation;DME and/or AE instruction;Therapeutic activities;Patient/family education;Balance training;Cognitive remediation/compensation      OT Goals(Current goals can be found in the care plan section)   Acute Rehab OT Goals Patient Stated Goal: to go home OT Goal Formulation: Patient unable to participate in goal setting Time For Goal Achievement: 07/25/24 Potential to Achieve Goals: Good ADL Goals Pt Will Perform Grooming: with supervision;sitting Pt Will Perform Upper Body Dressing: with contact guard assist;sitting Pt Will Perform Lower Body Dressing: with contact guard assist;sit to/from stand;sitting/lateral leans Pt Will Transfer to Toilet: with contact guard assist;ambulating;regular height toilet Additional ADL Goal #1: pt will  follow 2 step command with min cues in prep for ADLs   OT Frequency:  Min 2X/week    Co-evaluation PT/OT/SLP Co-Evaluation/Treatment: Yes Reason for Co-Treatment: Complexity of the patient's impairments (multi-system involvement);Necessary to address cognition/behavior during functional activity;For patient/therapist safety;To address functional/ADL transfers   OT goals addressed during session: ADL's and self-care;Strengthening/ROM      AM-PAC OT 6 Clicks Daily Activity     Outcome Measure Help from another person eating meals?: A Little Help from another person taking care of personal grooming?: A Little Help from another person toileting, which includes using toliet, bedpan, or urinal?: A Little Help from another person bathing (including washing, rinsing, drying)?: A Lot Help from another person to put on and taking off regular upper body clothing?: A Little Help from another person to put on and taking off regular lower body clothing?: A Lot 6 Click Score: 16   End of Session Nurse Communication: Mobility status  Activity Tolerance: Patient tolerated treatment well Patient left: in chair;with call bell/phone within reach;with chair alarm set (posey belt alarm)  OT Visit Diagnosis: Unsteadiness on feet (R26.81);Other abnormalities of gait and mobility (R26.89);Muscle weakness (generalized) (M62.81);Other symptoms and signs involving cognitive function                Time: 9179-9154 OT Time Calculation (min): 25 min Charges:  OT General Charges $OT Visit: 1 Visit OT Evaluation $OT Eval Moderate Complexity: 1 Mod  Layann Bluett Ray, OTD, OTR/L SecureChat Preferred Acute Rehab [phone]   Nicolas Ray 07/11/2024,  10:56 AM

## 2024-07-11 NOTE — Plan of Care (Signed)
  Problem: Fluid Volume: Goal: Ability to maintain a balanced intake and output will improve Outcome: Progressing   Problem: Skin Integrity: Goal: Risk for impaired skin integrity will decrease Outcome: Progressing   

## 2024-07-12 DIAGNOSIS — I4901 Ventricular fibrillation: Secondary | ICD-10-CM | POA: Diagnosis not present

## 2024-07-12 DIAGNOSIS — N179 Acute kidney failure, unspecified: Secondary | ICD-10-CM

## 2024-07-12 DIAGNOSIS — J9601 Acute respiratory failure with hypoxia: Secondary | ICD-10-CM | POA: Diagnosis not present

## 2024-07-12 DIAGNOSIS — I5023 Acute on chronic systolic (congestive) heart failure: Secondary | ICD-10-CM | POA: Diagnosis not present

## 2024-07-12 LAB — BASIC METABOLIC PANEL WITH GFR
Anion gap: 21 — ABNORMAL HIGH (ref 5–15)
BUN: 77 mg/dL — ABNORMAL HIGH (ref 8–23)
CO2: 18 mmol/L — ABNORMAL LOW (ref 22–32)
Calcium: 8.9 mg/dL (ref 8.9–10.3)
Chloride: 95 mmol/L — ABNORMAL LOW (ref 98–111)
Creatinine, Ser: 5.33 mg/dL — ABNORMAL HIGH (ref 0.61–1.24)
GFR, Estimated: 11 mL/min — ABNORMAL LOW (ref >60)
Glucose, Bld: 132 mg/dL — ABNORMAL HIGH (ref 70–99)
Potassium: 3.7 mmol/L (ref 3.5–5.1)
Sodium: 134 mmol/L — ABNORMAL LOW (ref 135–145)

## 2024-07-12 LAB — GLUCOSE, CAPILLARY
Glucose-Capillary: 153 mg/dL — ABNORMAL HIGH (ref 70–99)
Glucose-Capillary: 182 mg/dL — ABNORMAL HIGH (ref 70–99)
Glucose-Capillary: 106 mg/dL — ABNORMAL HIGH (ref 70–99)
Glucose-Capillary: 151 mg/dL — ABNORMAL HIGH (ref 70–99)

## 2024-07-12 LAB — CBC
HCT: 39.8 % (ref 39.0–52.0)
Hemoglobin: 12.8 g/dL — ABNORMAL LOW (ref 13.0–17.0)
MCH: 21.5 pg — ABNORMAL LOW (ref 26.0–34.0)
MCHC: 32.2 g/dL (ref 30.0–36.0)
MCV: 66.8 fL — ABNORMAL LOW (ref 80.0–100.0)
Platelets: 156 K/uL (ref 150–400)
RBC: 5.96 MIL/uL — ABNORMAL HIGH (ref 4.22–5.81)
RDW: 14.4 % (ref 11.5–15.5)
WBC: 14.6 K/uL — ABNORMAL HIGH (ref 4.0–10.5)
nRBC: 0 % (ref 0.0–0.2)

## 2024-07-12 MED ORDER — OLANZAPINE 5 MG PO TABS
5.0000 mg | ORAL_TABLET | Freq: Once | ORAL | Status: AC
  Administered 2024-07-12: 5 mg via ORAL
  Filled 2024-07-12: qty 1

## 2024-07-12 MED ORDER — OLANZAPINE 5 MG PO TABS
5.0000 mg | ORAL_TABLET | Freq: Two times a day (BID) | ORAL | Status: DC
  Administered 2024-07-12 – 2024-07-13 (×2): 5 mg via ORAL
  Filled 2024-07-12 (×6): qty 1

## 2024-07-12 MED ORDER — LORAZEPAM 2 MG/ML IJ SOLN
0.5000 mg | Freq: Once | INTRAMUSCULAR | Status: AC
  Administered 2024-07-12: 0.5 mg via INTRAVENOUS
  Filled 2024-07-12: qty 1

## 2024-07-12 MED ORDER — OLANZAPINE 5 MG PO TABS: 5.0000 mg | ORAL_TABLET | Freq: Two times a day (BID) | ORAL | Status: DC

## 2024-07-12 MED ORDER — OLANZAPINE 10 MG IM SOLR: 5.0000 mg | Freq: Once | INTRAMUSCULAR | Status: DC

## 2024-07-12 MED ORDER — OLANZAPINE 10 MG IM SOLR
2.5000 mg | Freq: Once | INTRAMUSCULAR | Status: DC | PRN
  Filled 2024-07-12: qty 10

## 2024-07-12 NOTE — Consult Note (Signed)
 Physical Medicine and Rehabilitation Consult Reason for Consult:debility Referring Physician: Tobie   HPI: Nicolas Ray is a 73 y.o. male with a history of CAD and NSTEMI, ICM, chronic heart failure, diabetes, obstructive sleep apnea who presented on 07/08/2024 with shortness of breath, coding shortly after presentation to the emergency room with V-fib cardiac arrest.  Patient was emergently intubated.  Patient was placed on IV amiodarone and was diuresed.  Patient with acute on chronic kidney disease as well with creatinine up to 4.  Palliative care is seeing patient for goals of care.  Patient is DNR and DNI.  He does not want hemodialysis either.  Patient was evaluated by therapies yesterday and was min assist for sit to stand transfers and walked at 150 feet min assist, hand-held assistance with scissoring staggering gait pattern.  With ADLs he was min to mod assist for basic tasks.    Home: Home Living Family/patient expects to be discharged to:: Private residence Living Arrangements: Other relatives (sister) Available Help at Discharge: Family Type of Home: House Home Access: Level entry Home Layout: One level Bathroom Shower/Tub: Artist: None  Functional History: Prior Function Prior Level of Function : Independent/Modified Independent, Driving, Working/employed Mobility Comments: Reports ind with some instability, no device; Unreliable historian thought. Functional Status:  Mobility: Bed Mobility General bed mobility comments: OOB in chair upon arrival and departure Transfers Overall transfer level: Needs assistance Equipment used: 1 person hand held assist Transfers: Sit to/from Stand Sit to Stand: Min assist, +2 safety/equipment General transfer comment: mild instability/sway to R but is able to self correct, tried to encourage RW use but refuses. Ambulation/Gait Ambulation/Gait assistance: Min assist, +2 physical assistance, +2  safety/equipment Gait Distance (Feet): 150 Feet Assistive device: 1 person hand held assist Gait Pattern/deviations: Step-through pattern, Decreased stride length, Trunk flexed, Drifts right/left, Staggering right, Staggering left, Scissoring General Gait Details: Frequent LOB requiring min assist +2 for correction. Refuses to use RW, but held therapist's hand throughout distance. Cues for awareness, safety, and techniques to steady. VSS throughout. Gait velocity: dec Gait velocity interpretation: <1.31 ft/sec, indicative of household ambulator    ADL: ADL Overall ADL's : Needs assistance/impaired Eating/Feeding: Set up Grooming: Minimal assistance, Standing, Sitting Upper Body Bathing: Minimal assistance, Sitting Lower Body Bathing: Moderate assistance Upper Body Dressing : Minimal assistance Lower Body Dressing: Moderate assistance Toilet Transfer: Minimal assistance, +2 for physical assistance Toileting- Clothing Manipulation and Hygiene: Minimal assistance Functional mobility during ADLs: Minimal assistance, +2 for physical assistance  Cognition: Cognition Orientation Level: Oriented to person, Disoriented to place, Disoriented to time, Disoriented to situation Cognition Arousal: Alert Behavior During Therapy: Restless, Impulsive   ROS Past Medical History:  Diagnosis Date   Acute MI, lateral wall, initial episode of care (HCC) 04/18/2015   CAD (coronary artery disease) 04/18/2015   a. Lat STEMI 8/16 - LHC:  pLAD 90, mLAD 50, D1 100, dLCx 90, OM1 80, pRCA 75, EF 35-45% >> DES to D1;  b. staged PCI 04/20/15 - DES to mid LAD and DES to prox RCA   Depression    DM2 (diabetes mellitus, type 2) (HCC)    Hypothyroidism    Ischemic cardiomyopathy    a. Echo 8/16:  Mild LVH, EF 40-45%, mid-apical anterolateral and apical AK, grade 1 diastolic dysfunction, trivial effusion   OSA (obstructive sleep apnea) 09/21/2018   ST elevation myocardial infarction (STEMI) of lateral wall (HCC)  04/18/2015   Past Surgical History:  Procedure Laterality Date  BIOPSY  07/18/2023   Procedure: BIOPSY;  Surgeon: Stacia Glendia BRAVO, MD;  Location: [facility] ENDOSCOPY;  Service: Gastroenterology;;   CARDIAC CATHETERIZATION N/A 04/18/2015   Procedure: Left Heart Cath and Coronary Angiography;  Surgeon: Ozell Fell, MD;  Location: [facility] INVASIVE CV LAB;  Service: Cardiovascular;  Laterality: N/A;   CARDIAC CATHETERIZATION N/A 04/18/2015   Procedure: Coronary Stent Intervention;  Surgeon: Ozell Fell, MD;  Location: [facility] INVASIVE CV LAB;  Service: Cardiovascular;  Laterality: N/A;   CARDIAC CATHETERIZATION N/A 04/20/2015   Procedure: Coronary Stent Intervention;  Surgeon: Lonni JONETTA Cash, MD;  Location: [facility] INVASIVE CV LAB;  Service: Cardiovascular;  Laterality: N/A;   CORONARY STENT PLACEMENT     ESOPHAGOGASTRODUODENOSCOPY N/A 07/18/2023   Procedure: ESOPHAGOGASTRODUODENOSCOPY (EGD);  Surgeon: Stacia Glendia BRAVO, MD;  Location: [facility] ENDOSCOPY;  Service: Gastroenterology;  Laterality: N/A;   RIGHT/LEFT HEART CATH AND CORONARY ANGIOGRAPHY N/A 12/09/2022   Procedure: RIGHT/LEFT HEART CATH AND CORONARY ANGIOGRAPHY;  Surgeon: Fell Ozell, MD;  Location: [facility] INVASIVE CV LAB;  Service: Cardiovascular;  Laterality: N/A;   Family History  Problem Relation Age of Onset   Heart disease Mother    Cancer Father        Neck   Thyroid  disease Sister    Stroke Brother    Colon cancer Neg Hx    Diabetes Neg Hx    Liver cancer Neg Hx    Esophageal cancer Neg Hx    Social History:  reports that he has been smoking cigarettes. He has never used smokeless tobacco. He reports that he does not drink alcohol  and does not use drugs. Allergies:  Allergies  Allergen Reactions   Aluminum-Containing Compounds Other (See Comments)    Makes hands feel weak    Medications Prior to Admission  Medication Sig Dispense Refill   clopidogrel  (PLAVIX ) 75 MG tablet TAKE 1 TABLET(75 MG) BY MOUTH DAILY      levothyroxine  (SYNTHROID ) 150 MCG tablet TAKE 1 TABLET(150 MCG) BY MOUTH DAILY BEFORE BREAKFAST 90 tablet 3   sildenafil (REVATIO) 20 MG tablet SMARTSIG:1-5 Tablet(s) By Mouth       Blood pressure 134/81, pulse (!) 104, temperature 98.1 F (36.7 C), temperature source Oral, resp. rate 19, height 5' 8 (1.727 m), weight 80.9 kg, SpO2 94%. Physical Exam  Results for orders placed or performed during the hospital encounter of 07/08/24 (from the past 24 hours)  Glucose, capillary     Status: Abnormal   Collection Time: 07/11/24 11:43 AM  Result Value Ref Range   Glucose-Capillary 161 (H) 70 - 99 mg/dL  Glucose, capillary     Status: Abnormal   Collection Time: 07/11/24  3:38 PM  Result Value Ref Range   Glucose-Capillary 181 (H) 70 - 99 mg/dL  Glucose, capillary     Status: Abnormal   Collection Time: 07/11/24 10:46 PM  Result Value Ref Range   Glucose-Capillary 147 (H) 70 - 99 mg/dL  CBC     Status: Abnormal   Collection Time: 07/12/24  2:29 AM  Result Value Ref Range   WBC 14.6 (H) 4.0 - 10.5 K/uL   RBC 5.96 (H) 4.22 - 5.81 MIL/uL   Hemoglobin 12.8 (L) 13.0 - 17.0 g/dL   HCT 60.1 60.9 - 47.9 %   MCV 66.8 (L) 80.0 - 100.0 fL   MCH 21.5 (L) 26.0 - 34.0 pg   MCHC 32.2 30.0 - 36.0 g/dL   RDW 85.5 88.4 - 84.4 %   Platelets 156 150 - 400 K/uL  nRBC 0.0 0.0 - 0.2 %  Basic metabolic panel with GFR     Status: Abnormal   Collection Time: 07/12/24  2:29 AM  Result Value Ref Range   Sodium 134 (L) 135 - 145 mmol/L   Potassium 3.7 3.5 - 5.1 mmol/L   Chloride 95 (L) 98 - 111 mmol/L   CO2 18 (L) 22 - 32 mmol/L   Glucose, Bld 132 (H) 70 - 99 mg/dL   BUN 77 (H) 8 - 23 mg/dL   Creatinine, Ser 4.66 (H) 0.61 - 1.24 mg/dL   Calcium  8.9 8.9 - 10.3 mg/dL   GFR, Estimated 11 (L) >60 mL/min   Anion gap 21 (H) 5 - 15  Glucose, capillary     Status: Abnormal   Collection Time: 07/12/24  6:22 AM  Result Value Ref Range   Glucose-Capillary 153 (H) 70 - 99 mg/dL   Comment 1 Notify RN     Comment 2 Document in Chart    No results found.  Assessment/Plan: Diagnosis: 73 year old male with debility related to acute on chronic heart failure/V-fib cardiac arrest and multiple medical issues  POST ACUTE RECOMMENDATIONS: This pt was standing independently, dressed in his doorway. If needed, recommend HH follow up therapy for safety.   I have personally performed a face to face diagnostic evaluation of this patient. Additionally, I have examined the patient's medical record including any pertinent labs and radiographic images.    Thanks,  Arthea ONEIDA Gunther, MD 07/12/2024

## 2024-07-12 NOTE — Progress Notes (Signed)
 eLink Physician-Brief Progress Note Patient Name: Nicolas Ray DOB: 22-Sep-1950 MRN: [id number]   Date of Service  07/12/2024  HPI/Events of Note  Patient with mild agitated delirium.  eICU Interventions  Zyprexa 2.5 mg IM x 1 ordered and sitter order renewed.        Buena Boehm U Rea Kalama 07/12/2024, 5:54 AM

## 2024-07-12 NOTE — Progress Notes (Signed)
 Rounding Note   Patient Name: Nicolas Ray Date of Encounter: 07/12/2024  [facility] Cardiologist: Ozell Fell, MD   Subjective Confused.  Denies chest pain and shortness of breath.  Reports urinating as per usual.   Scheduled Meds:  amiodarone  400 mg Oral Daily   atorvastatin   40 mg Oral Daily   Chlorhexidine  Gluconate Cloth  6 each Topical Daily   docusate  100 mg Oral BID   heparin  injection (subcutaneous)  5,000 Units Subcutaneous Q8H   insulin  aspart  0-15 Units Subcutaneous TID WC   levothyroxine   150 mcg Oral Q0600   nicotine  14 mg Transdermal Daily   OLANZapine  5 mg Oral BID   OLANZapine  5 mg Oral Once   polyethylene glycol  17 g Oral Daily   sodium chloride  flush  10-40 mL Intracatheter Q12H   Continuous Infusions:  PRN Meds: docusate sodium , ipratropium-albuterol, ondansetron  (ZOFRAN ) IV, mouth rinse, polyethylene glycol, sodium chloride  flush   Vital Signs  Vitals:   07/12/24 0006 07/12/24 0449 07/12/24 0501 07/12/24 0913  BP: (!) 150/82  (!) 147/80 134/81  Pulse: 96  100 (!) 104  Resp: 20  20 19   Temp: 98.3 F (36.8 C)  98.3 F (36.8 C) 98.1 F (36.7 C)  TempSrc: Oral  Oral Oral  SpO2: 93%  94% 94%  Weight:  81.7 kg 80.9 kg   Height:        Intake/Output Summary (Last 24 hours) at 07/12/2024 1137 Last data filed at 07/12/2024 0944 Gross per 24 hour  Intake 100 ml  Output --  Net 100 ml      07/12/2024    5:01 AM 07/12/2024    4:49 AM 07/11/2024   10:20 PM  Last 3 Weights  Weight (lbs) 178 lb 4.8 oz 180 lb 1.9 oz 180 lb 4.8 oz  Weight (kg) 80.876 kg 81.7 kg 81.784 kg      Telemetry Sinus rhythm.  Sinus tachycardia.  - Personally Reviewed  ECG  N/a - Personally Reviewed  Physical Exam  VS:  BP 134/81 (BP Location: Left Arm)   Pulse (!) 104   Temp 98.1 F (36.7 C) (Oral)   Resp 19   Ht 5' 8 (1.727 m)   Wt 80.9 kg   SpO2 94%   BMI 27.11 kg/m  , BMI Body mass index is 27.11 kg/m. GENERAL:  Standing at the  doorway to his room.  Refusing to go inside.  No acute distress.  HEENT: Pupils equal round and reactive, fundi not visualized, oral mucosa unremarkable NECK:  No jugular venous distention, waveform within normal limits, carotid upstroke brisk and symmetric, no bruits, no thyromegaly LUNGS:  Clear to auscultation bilaterally HEART:  RRR.  PMI not displaced or sustained,S1 and S2 within normal limits, no S3, no S4, no clicks, no rubs, no murmurs ABD:  Flat, positive bowel sounds normal in frequency in pitch, no bruits, no rebound, no guarding, no midline pulsatile mass, no hepatomegaly, no splenomegaly EXT:  2 plus pulses throughout, no edema, no cyanosis no clubbing SKIN:  No rashes no nodules NEURO:  Cranial nerves II through XII grossly intact, motor grossly intact throughout PSYCH:  Confused and mildly combative.    Labs High Sensitivity Troponin:   Recent Labs  Lab 07/08/24 0628 07/08/24 0825 07/09/24 0159 07/09/24 0653 07/09/24 0844  TROPONINIHS 15 18* 355* 1,888* 2,227*     Chemistry Recent Labs  Lab 07/09/24 0159 07/09/24 0200 07/10/24 0219 07/11/24 0425 07/12/24 9770  NA  --    < > 134* 136 134*  K  --    < > 4.3 3.9 3.7  CL  --    < > 98 96* 95*  CO2  --    < > 20* 21* 18*  GLUCOSE  --    < > 154* 138* 132*  BUN  --    < > 59* 69* 77*  CREATININE  --    < > 4.06* 5.07* 5.33*  CALCIUM   --    < > 8.6* 8.5* 8.9  MG 3.0*  --  2.4 2.3  --   PROT  --   --  7.1  --   --   ALBUMIN  --   --  3.4*  --   --   AST  --   --  23  --   --   ALT  --   --  23  --   --   ALKPHOS  --   --  66  --   --   BILITOT  --   --  1.1  --   --   GFRNONAA  --    < > 15* 11* 11*  ANIONGAP  --    < > 16* 19* 21*   < > = values in this interval not displayed.    Lipids  Recent Labs  Lab 07/09/24 0159  TRIG 60    Hematology Recent Labs  Lab 07/10/24 0219 07/11/24 0425 07/12/24 0229  WBC 15.6* 15.9* 14.6*  RBC 6.06* 5.69 5.96*  HGB 13.1 12.4* 12.8*  HCT 41.3 38.8* 39.8  MCV  68.2* 68.2* 66.8*  MCH 21.6* 21.8* 21.5*  MCHC 31.7 32.0 32.2  RDW 14.6 14.7 14.4  PLT 163 145* 156   Thyroid   Recent Labs  Lab 07/08/24 2014  TSH 2.112    BNP Recent Labs  Lab 07/08/24 0628  BNP 648.0*    DDimer No results for input(s): DDIMER in the last 168 hours.   Radiology  No results found.  Cardiac Studies Echo 07/09/24:  1. Left ventricular ejection fraction, by estimation, is 20 to 25%. The  left ventricle has severely decreased function. The left ventricle  demonstrates global hypokinesis. The left ventricular internal cavity size  was mildly dilated. There is mild left  ventricular hypertrophy. Left ventricular diastolic parameters are  indeterminate. Elevated left atrial pressure.   2. Right ventricular systolic function is normal. The right ventricular  size is normal.   3. Left atrial size was mildly dilated.   4. Right atrial size was mildly dilated.   5. The mitral valve is normal in structure. No evidence of mitral valve  regurgitation. No evidence of mitral stenosis.   6. Low flow low gradient moderate aortic stenosis. Mean grad 6, AVA VTI  1.34, DI 0.39, SVI 23. . The aortic valve was not well visualized. There  is moderate calcification of the aortic valve. There is moderate  thickening of the aortic valve. Aortic  valve regurgitation is not visualized. Mild aortic valve stenosis. Aortic  valve area, by VTI measures 1.34 cm. Aortic valve mean gradient measures  6.0 mmHg.   LHC/RHC 12/2022:   1.  Continued patency of the left main 2.  Continued patency of the LAD and diagonal stents with mild diffuse in-stent restenosis in both vessels, moderate distal edge restenosis in the LAD and the diagonal 3.  Stable but severe stenosis of a small first OM branch and total occlusion  of the mid left circumflex which is now collateralized via left to right collaterals filling the distal OM 4.  Continued patency of the RCA stent with mild diffuse in-stent  restenosis and mild to moderate distal RCA and proximal PDA stenoses 5.  Normal LVEDP of 12 mmHg, normal wedge pressure of 10 mmHg, then well compensated hemodynamics with normal right-sided diastolic filling pressures and preserved cardiac output of 7.4 L/min with cardiac index of 3.6 L/min/m   Recommendations: Continued medical therapy  Patient Profile   73 y.o. 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  Assessment & Plan   # VF Arrest: Patient inititally admitted with hypertensive emergency and flash pulmonary edema.  While in the ED he had VF/torsades and was immediately resuscitated.  He has subsequently been started on amiodarone.  Maintain K>4, Mg>2.  Neither were low at the time of his arrest.  Not a candidate for ICD at this time and he is not interested.  Currently lacks capacity to make that decision, but this has been a historical perspective as well.    # HFrEF:  Last echo 10/2022 revealed VLEF 15-20%.  Grade 3 diastolic dysfunction.  Multiple medication intolerances, CKD, and non-adherence have limited GDMT titration.  Currently euvolemic. Hold diuretics.   # HTN:  Initial BP 220 with EMS.  Thought to have had flash pulmonary edema. BP now controlled off all meds.   # CAD:  S/p STEMI.  Refused CABG and underwent multivessel PCI in 2016.    # Acute on chronic renal failure:  Creatinine continues to rise.  5.33 from 2.83 on admission.  Declines HD.  Currently aneuric, though I can't rule out that he is urinating and flushing. Likely mixed with acute ATN from VF arrest and hypovolemia.   I think he is dry.  Asked him to drink more fluids today and track UOP closely.  Discussed with his sisters that HD is not something we can force on a person, even if he lacks capacity.  They expressed understanding.       For questions or updates, please contact [facility] Please consult [email or web address] for contact info under       Signed, Annabella Scarce, MD  07/12/2024, 11:37 AM

## 2024-07-12 NOTE — NC FL2 (Signed)
 [place]  MEDICAID FL2 LEVEL OF CARE FORM     IDENTIFICATION  Patient Name: Nicolas Ray Birthdate: Nov 19, 1950 Sex: male Admission Date (Current Location): 07/08/2024  [facility] and [place] Number:  Producer, Television/film/video and Address:  [facility] [place], [address], [place], [place] [address]      Provider Number: [id number]  Attending Physician Name and Address:  Tobie Yetta HERO, MD  Relative Name and Phone Number:  Fremon, Zacharia (sister) [phone]    Current Level of Care: Hospital Recommended Level of Care: Skilled Nursing Facility Prior Approval Number:    Date Approved/Denied:   PASRR Number: [id number]  Discharge Plan: SNF    Current Diagnoses: Patient Active Problem List   Diagnosis Date Noted   Acute kidney injury superimposed on chronic kidney disease 07/11/2024   On mechanically assisted ventilation (HCC) 07/09/2024   Acute respiratory failure with hypoxia (HCC) 07/09/2024   VF (ventricular fibrillation) (HCC) 07/09/2024   Acute on chronic HFrEF (heart failure with reduced ejection fraction) (HCC) 07/09/2024   Acute congestive heart failure (HCC) 07/08/2024   Cardiac arrest with ventricular fibrillation (HCC) 07/08/2024   Esophagitis 10/09/2023   Helicobacter pylori gastritis 10/09/2023   Screening for colon cancer 10/09/2023   Obstructive and reflux uropathy, unspecified 09/21/2023   Acute blood loss anemia 08/03/2023   Chronic gastritis 07/26/2023   Duodenal ulcer 07/18/2023   Platelet inhibition due to Plavix  07/18/2023   GI bleed 07/17/2023   Right nephrolithiasis 07/14/2023   Viral URI with cough 07/14/2023   Nut allergy 06/01/2023   Gross hematuria 06/01/2023   Primary insomnia 06/01/2023   HFrEF (heart failure with reduced ejection fraction) (HCC) 12/09/2022   Ischemic cardiomyopathy 11/19/2022   Peripheral polyneuropathy 12/02/2021   CKD (chronic kidney disease) stage 4, GFR 15-29 ml/min (HCC) 08/08/2021   Hyperlipidemia,  unspecified 08/08/2021   Hypertensive heart disease with heart failure (HCC) 08/08/2021   Lower urinary tract symptoms 08/08/2021   Smoker 08/08/2021   Stented coronary artery 08/08/2021   History of ST elevation myocardial infarction (STEMI), of lateral wall 08/08/2021   OSA (obstructive sleep apnea) 09/21/2018   BPH with urinary obstruction 04/14/2016   Bilateral hydronephrosis 01/08/2016   Urinary retention 01/08/2016   Coronary artery disease involving coronary bypass graft of native heart without angina pectoris    Type 2 diabetes mellitus with diabetic neuropathy, unspecified (HCC) 04/19/2015   Chronic systolic heart failure (HCC) 04/19/2015   Other male erectile dysfunction 04/18/2014   Sciatica 03/15/2013   Hypothyroidism 02/08/2013   Essential hypertension 02/08/2013   Depression 01/25/2013   Low back pain 01/25/2013    Orientation RESPIRATION BLADDER Height & Weight     Self  Normal Continent Weight: 178 lb 4.8 oz (80.9 kg) Height:  5' 8 (172.7 cm)  BEHAVIORAL SYMPTOMS/MOOD NEUROLOGICAL BOWEL NUTRITION STATUS      Continent Diet (Please see discharge sumary)  AMBULATORY STATUS COMMUNICATION OF NEEDS Skin   Extensive Assist Verbally PU Stage and Appropriate Care (Pressure Injury Back Left;Upper Deep Tissue Pressure Injury)                       Personal Care Assistance Level of Assistance  Bathing, Feeding, Dressing Bathing Assistance: Limited assistance Feeding assistance: Limited assistance Dressing Assistance: Limited assistance     Functional Limitations Info  Speech, Hearing, Sight Sight Info: Adequate Hearing Info: Adequate Speech Info: Adequate    SPECIAL CARE FACTORS FREQUENCY  PT (By licensed PT), OT (By licensed OT)  PT Frequency: 5x week OT Frequency: 5x week            Contractures Contractures Info: Not present    Additional Factors Info  Code Status, Allergies, Insulin  Sliding Scale, Psychotropic Code Status Info: DNR  limtied Allergies Info: Aluminum-containing Compounds Psychotropic Info: Zyprexa Insulin  Sliding Scale Info: Please see discharge summary       Current Medications (07/12/2024):  This is the current hospital active medication list Current Facility-Administered Medications  Medication Dose Route Frequency Provider Last Rate Last Admin   amiodarone (PACERONE) tablet 400 mg  400 mg Oral Daily Hayes Lander L, NP   400 mg at 07/12/24 9063   atorvastatin  (LIPITOR ) tablet 40 mg  40 mg Oral Daily Diaz, Alma L, NP   40 mg at 07/12/24 9063   Chlorhexidine  Gluconate Cloth 2 % PADS 6 each  6 each Topical Daily Assaker, Darrin, MD   6 each at 07/12/24 0943   docusate (COLACE) 50 MG/5ML liquid 100 mg  100 mg Oral BID Diaz, Alma L, NP       docusate sodium  (COLACE) capsule 100 mg  100 mg Oral BID PRN Malka Darrin, MD   100 mg at 07/12/24 0936   heparin  injection 5,000 Units  5,000 Units Subcutaneous Q8H Hayes Lander L, NP   5,000 Units at 07/12/24 1202   insulin  aspart (novoLOG ) injection 0-15 Units  0-15 Units Subcutaneous TID [facility] Gatha Pence, MD   3 Units at 07/12/24 1201   ipratropium-albuterol (DUONEB) 0.5-2.5 (3) MG/3ML nebulizer solution 3 mL  3 mL Nebulization Q15 min PRN Palumbo, April, MD   3 mL at 07/08/24 0648   levothyroxine  (SYNTHROID ) tablet 150 mcg  150 mcg Oral Q0600 Diaz, Alma L, NP   150 mcg at 07/12/24 0651   nicotine (NICODERM CQ - dosed in mg/24 hours) patch 14 mg  14 mg Transdermal Daily Georgina Basket, MD   14 mg at 07/11/24 1014   OLANZapine (ZYPREXA) tablet 5 mg  5 mg Oral BID Patel, Pranav M, MD       ondansetron  (ZOFRAN ) injection 4 mg  4 mg Intravenous Q6H PRN Gretta Leita SQUIBB, DO       Oral care mouth rinse  15 mL Mouth Rinse PRN Gatha Pence, MD       polyethylene glycol (MIRALAX  / GLYCOLAX ) packet 17 g  17 g Oral Daily PRN Assaker, Darrin, MD   17 g at 07/12/24 0934   polyethylene glycol (MIRALAX  / GLYCOLAX ) packet 17 g  17 g Oral Daily Diaz, Alma L, NP        sodium chloride  flush (NS) 0.9 % injection 10-40 mL  10-40 mL Intracatheter Q12H Gretta Leita P, DO   30 mL at 07/12/24 0941   sodium chloride  flush (NS) 0.9 % injection 10-40 mL  10-40 mL Intracatheter PRN Gretta Leita SQUIBB, DO         Discharge Medications: Please see discharge summary for a list of discharge medications.  Relevant Imaging Results:  Relevant Lab Results:   Additional Information SSN [id number]  Luise JAYSON Pan, LCSWA

## 2024-07-12 NOTE — Progress Notes (Signed)
 Physical Therapy Treatment  Patient Details Name: Nicolas Ray MRN: [id number] DOB: 05/11/1951 Today's Date: 07/12/2024   History of Present Illness 73 y/o M presenting to ED on 07/10/2024 with difficulty breathing, pt coded with v-fib arrest Jul 10, 2024, intubated. Extubated 11/9. Admitted for acute CHF. PMH includes tobacco use, STEMI, CAD s/p PCI, ICM , HFrEF, Dm2, hypothyroidism, GIB, OSA, elevated BNP, BLE edema.    PT Comments  Pt progressing towards physical therapy goals. Sisters present during session and supportive. Sitter also present and provided +2 assist for safety throughout session. Pt continues to be impulsive and difficult to redirect. Sitter reports that she has been walking the halls frequently with the patient prior to PT arrival. At the time of PT treatment session, pt required up to mod assist for balance recovery. Attempted to trial the rollator, but pt unsafe with usage, demonstrating large amplitude lateral sway and rushed pace. Overall more controlled with HHA and mod assist. Acute PT recommendations updated to continued rehab <3 hours/day at d/c. Will continue to follow.    If plan is discharge home, recommend the following: A lot of help with walking and/or transfers;A lot of help with bathing/dressing/bathroom;Assistance with cooking/housework;Direct supervision/assist for medications management;Direct supervision/assist for financial management;Assist for transportation;Help with stairs or ramp for entrance;Supervision due to cognitive status   Can travel by private vehicle     Yes  Equipment Recommendations  Rolling walker (2 wheels) (TBD)    Recommendations for Other Services Rehab consult     Precautions / Restrictions Precautions Precautions: Fall Recall of Precautions/Restrictions: Impaired Precaution/Restrictions Comments: Difficult to redirect. Restrictions Weight Bearing Restrictions Per Provider Order: No     Mobility  Bed Mobility Overal bed mobility: Needs  Assistance Bed Mobility: Supine to Sit, Sit to Supine     Supine to sit: Min assist Sit to supine: Min assist   General bed mobility comments: Assist for trunk elevation to full sitting position and for LE elevation back into bed at end of session.    Transfers Overall transfer level: Needs assistance Equipment used: 1 person hand held assist, Rollator (4 wheels) Transfers: Sit to/from Stand Sit to Stand: Min assist, +2 safety/equipment           General transfer comment: Immediately reaching for external support upon stand. Assist for balance support and safety.    Ambulation/Gait Ambulation/Gait assistance: Min assist, +2 safety/equipment, Mod assist Gait Distance (Feet): 100 Feet Assistive device: 1 person hand held assist, Rollator (4 wheels) Gait Pattern/deviations: Step-through pattern, Decreased stride length, Trunk flexed, Drifts right/left, Staggering right, Staggering left, Leaning posteriorly Gait velocity: Decreased Gait velocity interpretation: 1.31 - 2.62 ft/sec, indicative of limited community ambulator   General Gait Details: Attempted to use the rollator but pt unsafe with poor posture and trying to ambulate very fast with it. Assist almost constantly for balance and recovery from large amplitude lateral sway. HHA was more controlled overall but with staggering and up to mod assist.   Stairs             Wheelchair Mobility     Tilt Bed    Modified Rankin (Stroke Patients Only)       Balance Overall balance assessment: Needs assistance Sitting-balance support: Feet supported Sitting balance-Leahy Scale: Good     Standing balance support: During functional activity, Single extremity supported, Reliant on assistive device for balance (or HHA) Standing balance-Leahy Scale: Poor  Communication Communication Communication: No apparent difficulties  Cognition Arousal: Alert Behavior During Therapy:  Restless, Impulsive   PT - Cognitive impairments: Orientation, Awareness, Memory, Attention, Problem solving, Sequencing, Safety/Judgement   Orientation impairments: Place, Time, Situation                   PT - Cognition Comments: Difficult to redirect. Perseverating on rollator and wanting his sister to try it out. Following commands: Intact      Cueing Cueing Techniques: Verbal cues, Gestural cues  Exercises      General Comments        Pertinent Vitals/Pain Pain Assessment Pain Assessment: Faces Faces Pain Scale: No hurt    Home Living                          Prior Function            PT Goals (current goals can now be found in the care plan section) Acute Rehab PT Goals Patient Stated Goal: go home now. PT Goal Formulation: Patient unable to participate in goal setting Time For Goal Achievement: 07/25/24 Potential to Achieve Goals: Fair Progress towards PT goals: Progressing toward goals    Frequency    Min 2X/week      PT Plan      Co-evaluation              AM-PAC PT 6 Clicks Mobility   Outcome Measure  Help needed turning from your back to your side while in a flat bed without using bedrails?: A Little Help needed moving from lying on your back to sitting on the side of a flat bed without using bedrails?: A Little Help needed moving to and from a bed to a chair (including a wheelchair)?: A Little Help needed standing up from a chair using your arms (e.g., wheelchair or bedside chair)?: A Little Help needed to walk in hospital room?: A Lot Help needed climbing 3-5 steps with a railing? : Total 6 Click Score: 15    End of Session Equipment Utilized During Treatment: Gait belt Activity Tolerance: Patient tolerated treatment well Patient left: in bed;with call bell/phone within reach;with nursing/sitter in room;with family/visitor present Nurse Communication: Mobility status PT Visit Diagnosis: Unsteadiness on feet  (R26.81);Other abnormalities of gait and mobility (R26.89);Muscle weakness (generalized) (M62.81);Difficulty in walking, not elsewhere classified (R26.2);Other symptoms and signs involving the nervous system (R29.898)     Time: 8671-8650 PT Time Calculation (min) (ACUTE ONLY): 21 min  Charges:    $Gait Training: 8-22 mins PT General Charges $$ ACUTE PT VISIT: 1 Visit                     Leita Sable, PT, DPT Acute Rehabilitation Services Secure Chat Preferred Office: [phone]    Leita JONETTA Sable 07/12/2024, 2:02 PM

## 2024-07-12 NOTE — TOC Progression Note (Addendum)
 Transition of Care [facility]) - Progression Note    Patient Details  Name: Nicolas Ray MRN: [id number] Date of Birth: [birth year]  [facility] CM/SW Contact  Luise JAYSON Pan, [place] Phone Number: 07/12/2024, 9:07 AM  Clinical Narrative:   CIR following for potential IP rehab candidacy.   Per daily meeting with treatment team, Chaplain consulted for HCPOA determination with family.   2:45 PM Per CIR, family in agreement to pursue SNF. CSW attempted to call patients sisters with no response. CSW to complete SNF workup and provide bed offers to family.   CSW will continue to follow.    Expected Discharge Plan: IP Rehab Facility Barriers to Discharge: Continued Medical Work up               Expected Discharge Plan and Services   Discharge Planning Services: CM Consult   Living arrangements for the past 2 months: Single Family Home                                       Social Drivers of Health (SDOH) Interventions SDOH Screenings   Food Insecurity: No Food Insecurity (07/08/2024)  Housing: Low Risk  (07/08/2024)  Transportation Needs: No Transportation Needs (07/08/2024)  Utilities: Not At Risk (07/08/2024)  Depression (PHQ2-9): Low Risk  (06/01/2023)  Social Connections: Moderately Isolated (07/08/2024)  Tobacco Use: High Risk (07/09/2024)    Readmission Risk Interventions     No data to display

## 2024-07-12 NOTE — Progress Notes (Signed)
 Spoke with pt's sister Mangino (contact information on chart) via phone, discussed current status and plan of care. Samira reports she will be visiting around noon. She reports patient lives with her, but with this confusion he would be unsafe to return to live with her, as she is typically wheelchair bound and requires support herself.  Samira agreeable to helping patient update his Advance Directive so that she may be healthcare power of attorney. Entered orders for Spiritual Care services to assist.

## 2024-07-12 NOTE — Progress Notes (Addendum)
 2220: Pt arrived to 3E15. Pt is confused and oriented to self only. Pt states I am in the mall.   0530: pt is still confused, but asking to leave and go home. Pt removed the tele monitor, leads, SPO2 sensor, and nasal canula. E-link MD is notified.   9345: Pt refused to get 150 mcg levothyroxine  stating  I am on 125 mcg, and I will take 5 tablets of 25 mcg instead of 6 tablets.  He also refused to get the heprin SQ and novolin.

## 2024-07-12 NOTE — Progress Notes (Signed)
 [facility] Progress Note Patient: Nicolas Ray FMW:[id number] DOB: 09/26/1950  DOA: 07/08/2024 DOS: the patient was seen and examined on 07/12/2024  Brief Hospital Course: Patient with PMH of CAD, cardiomyopathy, HTN, HLD, tachycardia, CKD 4 presented to hospital with complaints of cough and shortness of breath.  While in the ED had a V-fib arrest.   Admitted to the ICU. Extubated on 11/9. Transferred out of the ICU on 11/11.  Assessment and Plan: Witnessed V-fib/V. tach arrest. Acute on chronic HFrEF. History of CAD. ICM. Mild aortic stenosis. Currently on amiodarone. Cardiology following. Was on heparin  currently discontinued. Prognosis is very poor. Per cardiology not a candidate for ICD placement. Recommended comfort care.  Acute hypoxic respiratory failure. Requiring intubation. Currently oxygenation improving. On room air. Monitor.  Acute kidney injury on CKD stage IV. Progressively declining. Not a great candidate hemodialysis. Also patient does not want to receive hemodialysis. Prognosis is poor. Would recommend comfort care.  Hypothyroidism. On Synthroid . Monitor. Continue Synthroid .  Hypoxic encephalopathy. Patient appears to be significantly confused. Per family at his baseline patient is not confused. Currently threatening to leave the hospital. Not a candidate for leaving AMA. Does not appear to have any capacity right now. No focal deficit but CT head earlier unremarkable. Currently on Zyprexa. Monitor.  Goals of care conversation. Currently DNR based on the conversation with the ICU service. Not a good candidate for hemodialysis. Palliative care consulted. Not a candidate for ICD. Comfort care recommended. Monitor.  Subjective: Patient confused attempting to leave the hospital.  No nausea or vomiting.  Denies any other acute complaint.  Physical Exam: Basal crackles. S1-S2 present Bowel sounds present Nontender. Trace edema. Alert,  oriented to time place and person. able to follow commands consistently. Lacks capacity.   Data Reviewed: I have Reviewed nursing notes, Vitals, and Lab results. Since last encounter, pertinent lab results CBC and BMP   . I have ordered test including CBC and BMP  . I have discussed pt's care plan and test results with cardiology and palliative care  .   Disposition: Status is: Inpatient Remains inpatient appropriate because: Monitor for improvement in clarity goal of care  heparin  injection 5,000 Units Start: 07/12/24 0600 SCDs Start: 07/08/24 2245 SCDs Start: 07/08/24 1001   Family Communication: Family at bedside Level of care: Progressive continue Vitals:   07/12/24 0501 07/12/24 0913 07/12/24 1325 07/12/24 1537  BP: (!) 147/80 134/81 (!) 140/79 126/76  Pulse: 100 (!) 104 66 82  Resp: 20 19 (!) 22 20  Temp: 98.3 F (36.8 C) 98.1 F (36.7 C) 99 F (37.2 C) 98.6 F (37 C)  TempSrc: Oral Oral Oral Oral  SpO2: 94% 94% 94% 98%  Weight: 80.9 kg     Height:         Author: Yetta Blanch, MD 07/12/2024 6:38 PM  Please look on [email or web address] to find out who is on call.

## 2024-07-12 NOTE — Progress Notes (Addendum)
 Inpatient Rehab Admissions Coordinator:   Please see PMR consult note for CIR.  Pt does not need the intensity of hospital based rehab program at this time.  I've notified MD and TOC.  We will sign off.   Addendum 1320: I stopped at bedside to update pt/family.  2 sisters at bedside.  Pt unsteady in seated EOB with and w/o UE support.  He lives with 1 sister and would need to be independent for mobility and ADLs at discharge, which I don't think we could accomplish in a short CIR stay.  They are in agreement to pursue SNF.  I've updated TOC, MD, and PT.    Reche Lowers, PT, DPT Admissions Coordinator [phone] 07/12/24 12:46 PM

## 2024-07-13 DIAGNOSIS — I4901 Ventricular fibrillation: Secondary | ICD-10-CM | POA: Diagnosis not present

## 2024-07-13 DIAGNOSIS — I5041 Acute combined systolic (congestive) and diastolic (congestive) heart failure: Secondary | ICD-10-CM | POA: Diagnosis not present

## 2024-07-13 DIAGNOSIS — I469 Cardiac arrest, cause unspecified: Secondary | ICD-10-CM

## 2024-07-13 DIAGNOSIS — J9601 Acute respiratory failure with hypoxia: Secondary | ICD-10-CM | POA: Diagnosis not present

## 2024-07-13 DIAGNOSIS — I5023 Acute on chronic systolic (congestive) heart failure: Secondary | ICD-10-CM | POA: Diagnosis not present

## 2024-07-13 LAB — CBC
HCT: 40.2 % (ref 39.0–52.0)
Hemoglobin: 13.1 g/dL (ref 13.0–17.0)
MCH: 22.1 pg — ABNORMAL LOW (ref 26.0–34.0)
MCHC: 32.6 g/dL (ref 30.0–36.0)
MCV: 67.7 fL — ABNORMAL LOW (ref 80.0–100.0)
Platelets: 160 K/uL (ref 150–400)
RBC: 5.94 MIL/uL — ABNORMAL HIGH (ref 4.22–5.81)
RDW: 14.5 % (ref 11.5–15.5)
WBC: 11.1 K/uL — ABNORMAL HIGH (ref 4.0–10.5)
nRBC: 0 % (ref 0.0–0.2)

## 2024-07-13 LAB — BASIC METABOLIC PANEL WITH GFR
Anion gap: 17 — ABNORMAL HIGH (ref 5–15)
BUN: 101 mg/dL — ABNORMAL HIGH (ref 8–23)
CO2: 23 mmol/L (ref 22–32)
Calcium: 9 mg/dL (ref 8.9–10.3)
Chloride: 97 mmol/L — ABNORMAL LOW (ref 98–111)
Creatinine, Ser: 5.93 mg/dL — ABNORMAL HIGH (ref 0.61–1.24)
GFR, Estimated: 9 mL/min — ABNORMAL LOW (ref >60)
Glucose, Bld: 161 mg/dL — ABNORMAL HIGH (ref 70–99)
Potassium: 3.6 mmol/L (ref 3.5–5.1)
Sodium: 137 mmol/L (ref 135–145)

## 2024-07-13 LAB — GLUCOSE, CAPILLARY
Glucose-Capillary: 122 mg/dL — ABNORMAL HIGH (ref 70–99)
Glucose-Capillary: 121 mg/dL — ABNORMAL HIGH (ref 70–99)

## 2024-07-13 MED ORDER — LORAZEPAM 1 MG PO TABS: 1.0000 mg | ORAL_TABLET | ORAL | Status: DC | PRN

## 2024-07-13 MED ORDER — HYDROMORPHONE HCL 1 MG/ML IJ SOLN: 0.5000 mg | INTRAMUSCULAR | Status: DC | PRN

## 2024-07-13 MED ORDER — GLYCOPYRROLATE 0.2 MG/ML IJ SOLN: 0.2000 mg | INTRAMUSCULAR | Status: DC | PRN

## 2024-07-13 MED ORDER — ACETAMINOPHEN 650 MG RE SUPP: 650.0000 mg | Freq: Four times a day (QID) | RECTAL | Status: DC | PRN

## 2024-07-13 MED ORDER — POLYVINYL ALCOHOL 1.4 % OP SOLN: 1.0000 [drp] | Freq: Four times a day (QID) | OPHTHALMIC | Status: DC | PRN

## 2024-07-13 MED ORDER — LORAZEPAM 2 MG/ML IJ SOLN
1.0000 mg | INTRAMUSCULAR | Status: DC | PRN
Start: 2024-07-13 — End: 2024-07-15
  Administered 2024-07-13 – 2024-07-14 (×3): 1 mg via INTRAVENOUS
  Filled 2024-07-13 (×3): qty 1

## 2024-07-13 MED ORDER — LIDOCAINE 5 % EX PTCH
1.0000 | MEDICATED_PATCH | CUTANEOUS | Status: DC
  Administered 2024-07-13: 1 via TRANSDERMAL
  Filled 2024-07-13 (×2): qty 1

## 2024-07-13 MED ORDER — GLYCOPYRROLATE 1 MG PO TABS: 1.0000 mg | ORAL_TABLET | ORAL | Status: DC | PRN

## 2024-07-13 MED ORDER — LORAZEPAM 2 MG/ML PO CONC: 1.0000 mg | ORAL | Status: DC | PRN

## 2024-07-13 MED ORDER — GUAIFENESIN-DM 100-10 MG/5ML PO SYRP
5.0000 mL | ORAL_SOLUTION | ORAL | Status: DC | PRN
  Administered 2024-07-13 (×2): 5 mL via ORAL
  Filled 2024-07-13 (×2): qty 5

## 2024-07-13 MED ORDER — GLYCOPYRROLATE 0.2 MG/ML IJ SOLN
0.2000 mg | INTRAMUSCULAR | Status: DC | PRN
Start: 2024-07-13 — End: 2024-07-15

## 2024-07-13 MED ORDER — ACETAMINOPHEN 325 MG PO TABS: 650.0000 mg | ORAL_TABLET | Freq: Four times a day (QID) | ORAL | Status: DC | PRN

## 2024-07-13 MED ORDER — METOPROLOL SUCCINATE ER 25 MG PO TB24
25.0000 mg | ORAL_TABLET | Freq: Every day | ORAL | Status: DC
  Administered 2024-07-13 – 2024-07-15 (×2): 25 mg via ORAL
  Filled 2024-07-13 (×2): qty 1

## 2024-07-13 MED ORDER — OXYCODONE HCL 5 MG PO TABS: 5.0000 mg | ORAL_TABLET | ORAL | Status: DC | PRN

## 2024-07-13 MED ORDER — BIOTENE DRY MOUTH MT LIQD: 15.0000 mL | OROMUCOSAL | Status: DC | PRN

## 2024-07-13 MED ORDER — CLOPIDOGREL BISULFATE 75 MG PO TABS
75.0000 mg | ORAL_TABLET | Freq: Every day | ORAL | Status: DC
  Administered 2024-07-13 – 2024-07-15 (×2): 75 mg via ORAL
  Filled 2024-07-13 (×2): qty 1

## 2024-07-13 NOTE — Progress Notes (Signed)
 Occupational Therapy Treatment Patient Details Name: Nicolas Ray MRN: [id number] DOB: [birth year] Today's Date: 07/13/2024   History of present illness 73 y/o M presenting to ED on 2024/08/06 with difficulty breathing, pt coded with v-fib arrest Aug 06, 2024, intubated. Extubated 11/9. Admitted for acute CHF. PMH includes tobacco use, STEMI, CAD s/p PCI, ICM , HFrEF, Dm2, hypothyroidism, GIB, OSA, elevated BNP, BLE edema.   OT comments  Pt progressing toward goals this session, overall needs CGA for ADLs, and CGA for transfers/ambulation with 1 person HHA. Pt with incr stability, reaching out intermittently for UE support. Pt standing at sink for grooming task, noted to undershoot water  stream a few times to wet toothbrush. Pt presenting with impairments listed below, will follow acutely. Patient will benefit from continued inpatient follow up therapy, <3 hours/day to maximize safety/ind with ADL/functional mobility.       If plan is discharge home, recommend the following:  A little help with walking and/or transfers;A lot of help with bathing/dressing/bathroom;Direct supervision/assist for medications management;Direct supervision/assist for financial management;Help with stairs or ramp for entrance;Supervision due to cognitive status;Assist for transportation;Assistance with cooking/housework   Equipment Recommendations  Other (comment) (defer)    Recommendations for Other Services PT consult    Precautions / Restrictions Precautions Precautions: Fall Precaution/Restrictions Comments: recruitment consultant Restrictions Weight Bearing Restrictions Per Provider Order: No       Mobility Bed Mobility               General bed mobility comments: EOB upon arrival and departure    Transfers Overall transfer level: Needs assistance Equipment used: 1 person hand held assist, Rollator (4 wheels) Transfers: Sit to/from Stand Sit to Stand: Contact guard assist                 Balance Overall  balance assessment: Needs assistance Sitting-balance support: Feet supported Sitting balance-Leahy Scale: Good     Standing balance support: During functional activity Standing balance-Leahy Scale: Poor                             ADL either performed or assessed with clinical judgement   ADL Overall ADL's : Needs assistance/impaired     Grooming: Oral care;Standing                   Toilet Transfer: Contact guard assist;Ambulation           Functional mobility during ADLs: Contact guard assist      Extremity/Trunk Assessment Upper Extremity Assessment Upper Extremity Assessment: Generalized weakness   Lower Extremity Assessment Lower Extremity Assessment: Defer to PT evaluation        Vision   Additional Comments: functionally reads sentence during session without difficulty, noted to be undershooting when reaching for water /sink during oral care task   Perception Perception Perception: Not tested   Praxis Praxis Praxis: Not tested   Communication Communication Communication: No apparent difficulties   Cognition Arousal: Alert Behavior During Therapy: Restless, Impulsive Cognition: Cognition impaired   Orientation impairments: Situation, Time, Place Awareness: Online awareness impaired, Intellectual awareness impaired Memory impairment (select all impairments): Short-term memory Attention impairment (select first level of impairment): Sustained attention Executive functioning impairment (select all impairments): Problem solving, Reasoning                   Following commands: Intact        Cueing   Cueing Techniques: Verbal cues, Gestural cues  Exercises  Shoulder Instructions       General Comments VSS on RA    Pertinent Vitals/ Pain       Pain Assessment Pain Assessment: Faces Pain Score: 1  Pain Location: chest Pain Descriptors / Indicators: Sore Pain Intervention(s): Limited activity within patient's  tolerance, Monitored during session, Repositioned  Home Living                                          Prior Functioning/Environment              Frequency  Min 2X/week        Progress Toward Goals  OT Goals(current goals can now be found in the care plan section)  Progress towards OT goals: Progressing toward goals  Acute Rehab OT Goals Patient Stated Goal: did not state OT Goal Formulation: Patient unable to participate in goal setting Time For Goal Achievement: 07/25/24 Potential to Achieve Goals: Good ADL Goals Pt Will Perform Grooming: with supervision;sitting Pt Will Perform Upper Body Dressing: with contact guard assist;sitting Pt Will Perform Lower Body Dressing: with contact guard assist;sit to/from stand;sitting/lateral leans Pt Will Transfer to Toilet: with contact guard assist;ambulating;regular height toilet Additional ADL Goal #1: pt will follow 2 step command with min cues in prep for ADLs  Plan      Co-evaluation                 AM-PAC OT 6 Clicks Daily Activity     Outcome Measure   Help from another person eating meals?: A Little Help from another person taking care of personal grooming?: A Little Help from another person toileting, which includes using toliet, bedpan, or urinal?: A Little Help from another person bathing (including washing, rinsing, drying)?: A Little Help from another person to put on and taking off regular upper body clothing?: A Little Help from another person to put on and taking off regular lower body clothing?: A Little 6 Click Score: 18    End of Session Equipment Utilized During Treatment: Gait belt  OT Visit Diagnosis: Unsteadiness on feet (R26.81);Other abnormalities of gait and mobility (R26.89);Muscle weakness (generalized) (M62.81);Other symptoms and signs involving cognitive function   Activity Tolerance Patient tolerated treatment well   Patient Left in bed;with family/visitor  present (EOB sitter and family present)   Nurse Communication Mobility status        Time: 8651-8593 OT Time Calculation (min): 18 min  Charges: OT General Charges $OT Visit: 1 Visit OT Treatments $Self Care/Home Management : 8-22 mins  Amaury Kuzel K, OTD, OTR/L SecureChat Preferred Acute Rehab [phone]   Laneta POUR Koonce 07/13/2024, 2:52 PM

## 2024-07-13 NOTE — TOC Progression Note (Addendum)
 Transition of Care [facility]) - Progression Note    Patient Details  Name: Nicolas Ray MRN: [id number] Date of Birth: May 28, 1951  [facility]) CM/SW Contact  Luise JAYSON Pan, [place] Phone Number: 07/13/2024, 8:59 AM  Clinical Narrative:   CSW spoke with patients sister, Knick at [phone] to discuss treatment team informing CSW of families desire for patient to go to SNF. Samira confirmed and stated she wants her brother to remain in [place]. Samira informed CSW that she will be at the hospital after 3PM today. CSW to provide bed offers at bedside. Samira asked CSW to send HIPAA compliant text of current bed offers.   3:51 PM Per Palliative, plan for comfort care and for patient to go home with [facility] hospice. Palliative initiated referral to [facility]. Family refers team uses term home care.   CSW will continue to follow.    Expected Discharge Plan: Skilled Nursing Facility Barriers to Discharge: Continued Medical Work up, Other (must enter comment), English As A Second Language Teacher (Awaiting family decision on bed offers)               Expected Discharge Plan and Services   Discharge Planning Services: CM Consult   Living arrangements for the past 2 months: Single Family Home                                       Social Drivers of Health (SDOH) Interventions SDOH Screenings   Food Insecurity: No Food Insecurity (07/08/2024)  Housing: Low Risk  (07/08/2024)  Transportation Needs: No Transportation Needs (07/08/2024)  Utilities: Not At Risk (07/08/2024)  Depression (PHQ2-9): Low Risk  (06/01/2023)  Social Connections: Moderately Isolated (07/08/2024)  Tobacco Use: High Risk (07/09/2024)    Readmission Risk Interventions     No data to display

## 2024-07-13 NOTE — Plan of Care (Signed)
 Patient progressed to going home tomorrow on Hospice care. Will meet with Hospice tomorrow at 1400, before heading home. Has had two sisters at bedside with nephew today, and the difficult discussion with palliative to understand the pathology. Nicolas Ray has had a weakening heart (reason for arrest in ED, with just 1 round of CPR and 1 shock ROSC achieved). As a result of that event, suffered AKI and has had continuing decline of kidney function.

## 2024-07-13 NOTE — Progress Notes (Signed)
 [facility] Progress Note Patient: Nicolas Ray FMW:[id number] DOB: Jan 22, 1951  DOA: 07/08/2024 DOS: the patient was seen and examined on 07/13/2024  Brief Hospital Course: Patient with PMH of CAD, cardiomyopathy, HTN, HLD, tachycardia, CKD 4 presented to hospital with complaints of cough and shortness of breath.  While in the ED had a V-fib arrest.   Admitted to the ICU. Extubated on 11/9. Transferred out of the ICU on 11/11.   Assessment and Plan: Witnessed V-fib/V. tach arrest. Acute on chronic HFrEF. History of CAD. ICM. Moderate aortic stenosis. Hypertensive emergency with flash pulmonary edema. Was on amiodarone. Cardiology f was consulted Was on heparin  currently discontinued. Prognosis is very poor. Patient has not been compliant with his cardiac medication in the past.  Brought into the hospital with hypertensive emergency and had a V-fib arrest in the ED. Per cardiology not a candidate for ICD placement. Recommended comfort care.   Acute hypoxic respiratory failure. Requiring intubation. Currently oxygenation improving. On room air. Monitor.   Acute kidney injury on CKD stage IV. Progressively declining. Not a candidate hemodialysis. Also patient does not want to receive hemodialysis. Prognosis is poor. Would recommend comfort care. At baseline creatinine 2.7.  Upon admission serum creatinine was 2.8.  Worsened to currently 5.9 with BUN of 101.  Potassium 3.6.  Hypothyroidism. On Synthroid .   Hypoxic encephalopathy. Patient appears to be significantly confused. Per family at his baseline patient is not confused. Currently threatening to leave the hospital. Not a candidate for leaving AMA. Does not appear to have any capacity right now. No focal deficit but CT head earlier unremarkable. Currently on Zyprexa. Monitor.   Goals of care conversation. Currently DNR based on the conversation with the ICU service. Not a good candidate for  hemodialysis. Palliative care consulted. Not a candidate for ICD. Comfort care recommended. Monitor.  Subjective: No acute complaint.  No nausea no vomiting.  Physical Exam: Basilar crackles. S1-S2 present Bowel sound present Lower extremity edema seen. No asterixis  Data Reviewed: I have Reviewed nursing notes, Vitals, and Lab results. Since last encounter, pertinent lab results CBC and BMP   . I have discussed pt's care plan and test results with palliative care  .   Disposition: Status is: Inpatient Remains inpatient appropriate because: Arranging home hospice  Family Communication: Multiple family was at bedside Level of care: Progressive   Vitals:   07/13/24 0200 07/13/24 0526 07/13/24 0707 07/13/24 1634  BP:  (!) 146/73 132/77 133/75  Pulse:  88 80 80  Resp:  18 18   Temp: 98.2 F (36.8 C) 98.2 F (36.8 C) 98.2 F (36.8 C) 97.7 F (36.5 C)  TempSrc:  Oral Oral Oral  SpO2:  94% 92% 92%  Weight:  81.1 kg    Height:         Author: Yetta Blanch, MD 07/13/2024 6:58 PM  Please look on [email or web address] to find out who is on call.

## 2024-07-13 NOTE — TOC Progression Note (Addendum)
 Transition of Care [facility]) - Progression Note    Patient Details  Name: Nicolas Ray MRN: [id number] Date of Birth: 1951-02-27  Transition of Care [facility]) CM/SW Contact  Waddell Barnie Rama, RN Phone Number: 07/13/2024, 3:53 PM  Clinical Narrative:    NCM notified that family meeting has been done with palliative, and per Rosaline with Palliative family wants [facility] home hospice and Rosaline has notified  [facility] rep.  This NCM will confirm that [facility] is there choice for agecny. NCM confirmed with patient family they want [facility].   Inocente with [facility] will speak with family.    Expected Discharge Plan: Skilled Nursing Facility Barriers to Discharge: Continued Medical Work up, Other (must enter comment), English As A Second Language Teacher (Awaiting family decision on bed offers)               Expected Discharge Plan and Services   Discharge Planning Services: CM Consult   Living arrangements for the past 2 months: Single Family Home                                       Social Drivers of Health (SDOH) Interventions SDOH Screenings   Food Insecurity: No Food Insecurity (07/08/2024)  Housing: Low Risk  (07/08/2024)  Transportation Needs: No Transportation Needs (07/08/2024)  Utilities: Not At Risk (07/08/2024)  Depression (PHQ2-9): Low Risk  (06/01/2023)  Social Connections: Moderately Isolated (07/08/2024)  Tobacco Use: High Risk (07/09/2024)    Readmission Risk Interventions     No data to display

## 2024-07-13 NOTE — Progress Notes (Addendum)
 Palliative Medicine Inpatient Follow Up Note HPI: 73 year old male patient with a past medical history of STEMI in August 2016 status post DES 21 and staged PCI with DES to LAD and RCA complicated by ischemic cardiomyopathy with EF 15 to 20% in March 2024, hypertension, hyperlipidemia, type 2 diabetes mellitus and CKD stage IV.    [facility] on 11/7 for evaluation of shortness of breath. Course complicated by V-fib arrest in the ED requiring 1 round CPR 1 shock for ROSC subsequently intubation mechanical ventilation.    Palliative care has been asked to support additional goals of care conversations.   Today's Discussion 07/13/2024  *Please note that this is a verbal dictation therefore any spelling or grammatical errors are due to the Dragon Medical One system interpretation.  Chart reviewed inclusive of vital signs, progress notes, laboratory results Na 134, K 3.7, Chl 95, CO2 18, Glu 132, BUN 77, Cr 5.07, Ca 8.9, Anion Gap 21, GFR 11 and diagnostic images.   I met with Cashton at bedside this morning in the presence of his family member, Gwenyth. Tracer is aware of self and place though lacks understanding on why he is hospitalized.   We discussed concerns this morning in the setting of Barret's worsening kidney function and where to go from here. I shared the conversations which were held on Sunday though at this point family considered Jerrik's outcomes to be a mircale.   Chaplain had attempted advanced directives with Same though he was not able to comprehend the reason for document completion.   I have called and spoken to Riggin's sister, Venable I shared the importance of a family meeting ideally with patients daughters on speakerphone to better discern the paths to take moving forward.  Samira plans to be present this afternoon around 3PM for further discussions.   _______________________________________  I met at bedside this later morning with patients sister, Kathlen and  nephew Gwenyth. We discussed patients kidney function and what to anticipate moving forward. We reviewed irregardless at this juncture that rehabilitation may not yield fruitful results and his renal function will continue to decline/  I shared hospice would be a reasonable consideration at this time. I described hospice as a service for patients who have a life expectancy of 6 months or less. The goal of hospice is the preservation of dignity and quality at the end phases of life. Under hospice care, the focus changes from curative to symptom relief. I shared they would likely check in throughout the week in the home helping with symptom support.   We reviewed optimizing the time that Kyce has left given that he survived prior resuscitative efforts which family deems a miracle.  ______________________________________  Addendum:  Family meeting conducted at [employer] in the presence of Legrand Lasser, his sisters, Samira, Menira, and nephew, Gwenyth.   Dr. Tobie was able to share with patients family the patients underlying cardiac function is poor leading to the sequence of events he has endured during hospitalization. He reviews that Ken has worsening kidney function and in the setting of his underlying heart failure it is unlikely he would be able to tolerate or even be a candidate for advanced interventions such as dialysis. He shares that Curtis is also not a candidate for advanced therapies such as an AICD in the setting of his heart failure due to his kidney failure. He goes on to share his cognitive functioning has declined as a result of these disease burdens.  We discussed  options and goals. Patients family understand his time will be limited days to weeks. Jourdan endorses the desire to go home.  Discussed home hospice versus inpatient hospice. At this time patients family would like to honor patients wish to get home. Patients family prefer [facility] as they would be proximal to the hospice home in  [place] if things got to a point where this was needed.  Confirmed the desire for comfort care, no more invasive measures and to provide medications to treat symptom burden.   Questions and concerns addressed/Palliative Support Provided.   Add time : 50  Objective Assessment: Vital Signs Vitals:   07/13/24 0526 07/13/24 0707  BP: (!) 146/73 132/77  Pulse: 88 80  Resp: 18 18  Temp: 98.2 F (36.8 C) 98.2 F (36.8 C)  SpO2: 94% 92%    Intake/Output Summary (Last 24 hours) at 07/13/2024 0940 Last data filed at 07/13/2024 9189 Gross per 24 hour  Intake 1400 ml  Output 750 ml  Net 650 ml   Last Weight  Most recent update: 07/13/2024  6:25 AM    Weight  81.1 kg (178 lb 12.8 oz)            Gen: Frail elderly AA M chronically ill appearing HEENT: Dry mucous membranes CV: Regular rate and rhythm  PULM: On RA, breathing is not labored ABD: soft/nontender  EXT: BLE edema  Neuro: Alert and oriented x2  SUMMARY OF RECOMMENDATIONS   DNAR/DNI  Comfort care  Medications per [facility]  Patient is hopeful to go home for end of life  TOC to make a referral to [facility] for home hospice support  Ongoing PMT support ______________________________________________________________________________________ Rosaline Becton [place] Palliative Medicine Team Team Cell Phone: [phone] Please utilize secure chat with additional questions, if there is no response within 30 minutes please call the above phone number  Time: 50 Billing based on MDM: High  Palliative Medicine Team providers are available by phone from 7am to 7pm daily and can be reached through the team cell phone.  Should this patient require assistance outside of these hours, please call the patient's attending physician.

## 2024-07-14 DIAGNOSIS — J9601 Acute respiratory failure with hypoxia: Secondary | ICD-10-CM | POA: Diagnosis not present

## 2024-07-14 DIAGNOSIS — N179 Acute kidney failure, unspecified: Secondary | ICD-10-CM | POA: Diagnosis not present

## 2024-07-14 DIAGNOSIS — I469 Cardiac arrest, cause unspecified: Secondary | ICD-10-CM | POA: Diagnosis not present

## 2024-07-14 DIAGNOSIS — I5041 Acute combined systolic (congestive) and diastolic (congestive) heart failure: Secondary | ICD-10-CM | POA: Diagnosis not present

## 2024-07-14 LAB — GLUCOSE, CAPILLARY: Glucose-Capillary: 136 mg/dL — ABNORMAL HIGH (ref 70–99)

## 2024-07-14 NOTE — Progress Notes (Signed)
 [facility] Progress Note Patient: Nicolas Ray FMW:[id number] DOB: [birth year]  DOA: 07/08/2024 DOS: the patient was seen and examined on 07/14/2024  Brief Hospital Course: Patient with PMH of CAD, cardiomyopathy, HTN, HLD, tachycardia, CKD 4 presented to hospital with complaints of cough and shortness of breath.  While in the ED had a V-fib arrest.   Admitted to the ICU. Extubated on 11/9. Transferred out of the ICU on 11/11.  11/13: Vital stable, patient has been transitioned to full comfort care likely be going home with hospice tomorrow.   Assessment and Plan: Witnessed V-fib/V. tach arrest. Acute on chronic HFrEF. History of CAD. ICM. Moderate aortic stenosis. Hypertensive emergency with flash pulmonary edema. Was on amiodarone. Cardiology was consulted Was on heparin  currently discontinued. Prognosis is very poor. Patient has not been compliant with his cardiac medication in the past.  Brought into the hospital with hypertensive emergency and had a V-fib arrest in the ED. Per cardiology not a candidate for ICD placement. Recommended comfort care.   Acute hypoxic respiratory failure. Requiring intubation. Currently oxygenation improving. On room air. Monitor.   Acute kidney injury on CKD stage IV. Progressively declining. Not a candidate hemodialysis. Also patient does not want to receive hemodialysis. Prognosis is poor. Would recommend comfort care. At baseline creatinine 2.7.  Upon admission serum creatinine was 2.8.  Worsened to currently 5.93 with BUN of 101.  Potassium 3.6. - Now transition to full comfort care  Hypothyroidism. On Synthroid .   Hypoxic encephalopathy. Patient appears to be significantly confused. Per family at his baseline patient is not confused. No focal deficit and CT head was without any acute abnormality Currently on Zyprexa. Monitor.   Goals of care conversation. Currently DNR based on the conversation with the ICU service. Not a  good candidate for hemodialysis. Palliative care consulted. Not a candidate for ICD. Comfort care recommended. Monitor.  Subjective: Patient was resting comfortably when I saw him, he open his eyes and gave me a big smile.  When I asked that how his he feeling and whether he ate anything, his family in the room got very upset stating that he is comfort care and I am not respecting that he is resting, despite excusing and explanation that patient open his eyes in front of me and give me a smile he was keep shouting and asking me to leave the room.  Physical Exam: General.  Ill-appearing, frail elderly man, in no acute distress. Pulmonary.  Lungs clear bilaterally, normal respiratory effort. CV.  Regular rate and rhythm, no JVD, rub or murmur. Abdomen.  Soft, nontender, nondistended, BS positive. CNS.  Little somnolent but easily arousable.  No focal neurologic deficit. Extremities.  No edema, pulses intact and symmetrical.   Data Reviewed: I have Reviewed nursing notes, Vitals, and Lab results.   Disposition: Status is: Inpatient Remains inpatient appropriate because: Arranging home hospice  Family Communication: A nephew at bedside who does not want to listen to anything. Level of care: Progressive   Vitals:   07/13/24 1634 07/13/24 2003 07/14/24 0622 07/14/24 1100  BP: 133/75 130/74 111/64 124/71  Pulse: 80 (!) 106 66 83  Resp:  18  16  Temp: 97.7 F (36.5 C) 98 F (36.7 C) 98.6 F (37 C) 97.7 F (36.5 C)  TempSrc: Oral Oral Axillary Oral  SpO2: 92% 95% 100% 92%  Weight:      Height:         This record has been created using Conservation officer, historic buildings. Errors  have been sought and corrected,but may not always be located. Such creation errors do not reflect on the standard of care.  Author: Amaryllis Dare, MD 07/14/2024 2:33 PM  Please look on [email or web address] to find out who is on call.

## 2024-07-14 NOTE — Plan of Care (Signed)
 ?  Problem: Clinical Measurements: ?Goal: Will remain free from infection ?Outcome: Progressing ?  ?

## 2024-07-14 NOTE — TOC Progression Note (Signed)
 Transition of Care [facility]) - Progression Note    Patient Details  Name: Nicolas Ray MRN: [id number] Date of Birth: [birth year]  Transition of Care [facility]) CM/SW Contact  Waddell Barnie Rama, RN Phone Number: 07/14/2024, 4:29 PM  Clinical Narrative:    Patient will be going to his sister house at address of [address] [place].  Patient gives this NCM permission to speak with his sister, she would like for this NCM to call her at [phone].    Expected Discharge Plan: Skilled Nursing Facility Barriers to Discharge: Continued Medical Work up, Other (must enter comment), English As A Second Language Teacher (Awaiting family decision on bed offers)               Expected Discharge Plan and Services   Discharge Planning Services: CM Consult   Living arrangements for the past 2 months: Single Family Home                                       Social Drivers of Health (SDOH) Interventions SDOH Screenings   Food Insecurity: No Food Insecurity (07/08/2024)  Housing: Low Risk  (07/08/2024)  Transportation Needs: No Transportation Needs (07/08/2024)  Utilities: Not At Risk (07/08/2024)  Depression (PHQ2-9): Low Risk  (06/01/2023)  Social Connections: Moderately Isolated (07/08/2024)  Tobacco Use: High Risk (07/09/2024)    Readmission Risk Interventions     No data to display

## 2024-07-14 NOTE — Progress Notes (Signed)
 Care assumed from previous RN at 0430. Rounded on patient; patient is resting with eyes closed with even albeit shallow respirations. Patient remains on full comfort measures.

## 2024-07-15 DIAGNOSIS — I5021 Acute systolic (congestive) heart failure: Secondary | ICD-10-CM | POA: Diagnosis not present

## 2024-07-15 MED ORDER — POLYVINYL ALCOHOL 1.4 % OP SOLN: 1.0000 [drp] | Freq: Four times a day (QID) | OPHTHALMIC | 0 refills | Status: AC | PRN

## 2024-07-15 MED ORDER — GLYCOPYRROLATE 1 MG PO TABS: 1.0000 mg | ORAL_TABLET | ORAL | 0 refills | Status: AC | PRN

## 2024-07-15 MED ORDER — NICOTINE 14 MG/24HR TD PT24: 14.0000 mg | MEDICATED_PATCH | Freq: Every day | TRANSDERMAL | 0 refills | Status: AC

## 2024-07-15 MED ORDER — LORAZEPAM 1 MG PO TABS: 1.0000 mg | ORAL_TABLET | ORAL | 0 refills | Status: AC | PRN

## 2024-07-15 MED ORDER — GUAIFENESIN-DM 100-10 MG/5ML PO SYRP: 5.0000 mL | ORAL_SOLUTION | ORAL | 0 refills | Status: AC | PRN

## 2024-07-15 MED ORDER — OLANZAPINE 5 MG PO TABS: 5.0000 mg | ORAL_TABLET | Freq: Two times a day (BID) | ORAL | 0 refills | Status: AC

## 2024-07-15 MED ORDER — ORAL CARE MOUTH RINSE: 15.0000 mL | OROMUCOSAL | 0 refills | Status: AC | PRN

## 2024-07-15 MED ORDER — METOPROLOL SUCCINATE ER 25 MG PO TB24: 25.0000 mg | ORAL_TABLET | Freq: Every day | ORAL | 0 refills | Status: AC

## 2024-07-15 MED ORDER — LIDOCAINE 5 % EX PTCH: 1.0000 | MEDICATED_PATCH | CUTANEOUS | 0 refills | Status: AC

## 2024-07-15 MED ORDER — DOCUSATE SODIUM 50 MG/5ML PO LIQD: 100.0000 mg | Freq: Two times a day (BID) | ORAL | 0 refills | Status: AC

## 2024-07-15 MED ORDER — OXYCODONE HCL 5 MG PO TABS: 5.0000 mg | ORAL_TABLET | ORAL | 0 refills | Status: AC | PRN

## 2024-07-15 MED ORDER — AMIODARONE HCL 400 MG PO TABS: 400.0000 mg | ORAL_TABLET | Freq: Every day | ORAL | 0 refills | Status: AC

## 2024-07-15 MED ORDER — POLYETHYLENE GLYCOL 3350 17 G PO PACK: 17.0000 g | PACK | Freq: Every day | ORAL | 0 refills | Status: AC

## 2024-07-15 NOTE — Progress Notes (Signed)
 [id number] [facility] Liaison Note  Received a request from Transitions of Care Manager for hospice services at home after discharge. Spoke with family to initiate education related to hospice philosophy, services and team approach to care. Samira verbalized understanding of information given.  Per discussion, the plan is for discharge home by private vehicle today.  DME needs discussed. Samira requested hospital bed.  Other DME needs will be assessed by AV nurse.  Please send signed and completed DNR home with the patient/family.  Please provide prescriptions at discharge as needed to ensure ongoing symptom management.  [facility] information and contact numbers given to Samira.  Above information shared with Marval, Transitions of Care Manager.  Please call with any questions or concerns.  Thank you for the opportunity to participate in this patient's care.  Inocente Jacobs, BSN, RN [employer] [phone]

## 2024-07-15 NOTE — Discharge Summary (Signed)
 Physician Discharge Summary  Patient ID: Nicolas Ray MRN: [id number] DOB/AGE: 73-Mar-1952 73 y.o.  Admit date: 07/08/2024 Discharge date: 07/15/2024  Admission Diagnoses:  Discharge Diagnoses:  Principal Problem:   Acute congestive heart failure (HCC) Active Problems:   Cardiac arrest with ventricular fibrillation (HCC)   On mechanically assisted ventilation (HCC)   Acute respiratory failure with hypoxia (HCC)   VF (ventricular fibrillation) (HCC)   Acute on chronic HFrEF (heart failure with reduced ejection fraction) (HCC)   Acute kidney injury superimposed on chronic kidney disease   Discharged Condition: stable  Hospital Course: Patient is a 73 year old male with past medical history significant for coronary artery disease, cardiomyopathy, hypertension, hyperlipidemia, tachycardia and chronic kidney disease stage IV.  Patient presented to the emergency room with cough and shortness of breath.  While in the ED, patient had AV fever at rest.  Patient was admitted to ICU.  Patient was intubated.  Patient was extubated on 07/10/2024.SABRA  Hospitalist team assumed care on 07/12/2024.  Urinary hospital stay, renal function continued to deteriorate.  Patient was eventually made comfort directed care.  Patient will be discharged back home with hospice follow-up.  Witnessed V-fib/V. tach arrest: Acute on chronic HFrEF: History of CAD: ICM: Moderate aortic stenosis: Elevated, and rising troponin: Elevated cardiac BNP: Hypertensive emergency with flash pulmonary edema: -Cardiology was consulted to assist with patient's management. -Patient with was on heparin  but now discontinued.   -Prognosis is very poor. -Patient has not been compliant with his cardiac medication in the past.  Brought into the hospital with hypertensive emergency and had a V-fib arrest in the ED. -Per cardiology patient is not a candidate for ICD placement. -Recommended comfort care.   Acute hypoxic respiratory  failure. -S/p intubation and extubation. - Resolved significantly.  Patient is currently on room air.     Acute kidney injury on CKD stage IV. Progressively declining. Not a candidate hemodialysis. Also patient does not want to receive hemodialysis. Prognosis is poor. Comfort directed care. -At baseline creatinine 2.7.  Upon admission serum creatinine was 2.8.  Worsened to currently 5.93 with BUN of 101.  Potassium 3.6.   Hypothyroidism. On Synthroid .   Hypoxic encephalopathy. V-fib arrest at the emergency room, weakness.   No focal deficit and CT head was without any acute abnormality Monitor.    Consults: cardiology and palliative medicine and physical medicine and rehab.  Significant Diagnostic Studies:  -Prior to discharge, BUN was 101 and serum creatinine 5.93.  On presentation, serum creatinine was 2.71. -Troponin of 355 to 1888 to 2227 - Cardiac BNP of 648.  Echo revealed: 1. Left ventricular ejection fraction, by estimation, is 20 to 25%. The  left ventricle has severely decreased function. The left ventricle  demonstrates global hypokinesis. The left ventricular internal cavity size  was mildly dilated. There is mild left  ventricular hypertrophy. Left ventricular diastolic parameters are  indeterminate. Elevated left atrial pressure.   2. Right ventricular systolic function is normal. The right ventricular  size is normal.   3. Left atrial size was mildly dilated.   4. Right atrial size was mildly dilated.   5. The mitral valve is normal in structure. No evidence of mitral valve  regurgitation. No evidence of mitral stenosis.   6. Low flow low gradient moderate aortic stenosis. Mean grad 6, AVA VTI  1.34, DI 0.39, SVI 23. . The aortic valve was not well visualized. There  is moderate calcification of the aortic valve. There is moderate  thickening of the  aortic valve. Aortic  valve regurgitation is not visualized. Mild aortic valve stenosis. Aortic  valve area,  by VTI measures 1.34 cm. Aortic valve mean gradient measures  6.0 mmHg.   CT head without contrast revealed: 1. No acute intracranial abnormality. 2. Moderate chronic microvascular ischemic change.  Chest x-ray revealed: 1. Endotracheal tube appropriately positioned with tip 3.3 cm above the carina. 2. Mild increased interstitial markings.   Discharge Exam: Blood pressure 135/75, pulse 80, temperature 97.7 F (36.5 C), temperature source Oral, resp. rate 18, height 5' 8 (1.727 m), weight 81.1 kg, SpO2 97%.   Disposition: Discharge disposition: 50-Hospice/Home       Discharge Instructions     Diet - low sodium heart healthy   Complete by: As directed    Discharge wound care:   Complete by: As directed    Continue current wound care plan   Increase activity slowly   Complete by: As directed       Allergies as of 07/15/2024       Reactions   Aluminum-containing Compounds Other (See Comments)   Makes hands feel weak         Medication List     STOP taking these medications    sildenafil 20 MG tablet Commonly known as: REVATIO       TAKE these medications    amiodarone 400 MG tablet Commonly known as: PACERONE Take 1 tablet (400 mg total) by mouth daily. Start taking on: July 16, 2024   artificial tears ophthalmic solution Place 1 drop into both eyes 4 (four) times daily as needed for dry eyes.   clopidogrel  75 MG tablet Commonly known as: PLAVIX  TAKE 1 TABLET(75 MG) BY MOUTH DAILY   docusate 50 MG/5ML liquid Commonly known as: COLACE Take 10 mLs (100 mg total) by mouth 2 (two) times daily.   glycopyrrolate 1 MG tablet Commonly known as: ROBINUL Take 1 tablet (1 mg total) by mouth every 4 (four) hours as needed (excessive secretions).   guaiFENesin-dextromethorphan 100-10 MG/5ML syrup Commonly known as: ROBITUSSIN DM Take 5 mLs by mouth every 4 (four) hours as needed for cough (chest congestion).   levothyroxine  150 MCG  tablet Commonly known as: SYNTHROID  TAKE 1 TABLET(150 MCG) BY MOUTH DAILY BEFORE BREAKFAST   lidocaine  5 % Commonly known as: LIDODERM  Place 1 patch onto the skin daily. Remove & Discard patch within 12 hours or as directed by MD   LORazepam 1 MG tablet Commonly known as: ATIVAN Take 1 tablet (1 mg total) by mouth every 4 (four) hours as needed for anxiety.   metoprolol  succinate 25 MG 24 hr tablet Commonly known as: TOPROL -XL Take 1 tablet (25 mg total) by mouth daily. Start taking on: July 16, 2024   mouth rinse Liqd solution 15 mLs by Mouth Rinse route as needed (for oral care).   nicotine 14 mg/24hr patch Commonly known as: NICODERM CQ - dosed in mg/24 hours Place 1 patch (14 mg total) onto the skin daily. Start taking on: July 16, 2024   OLANZapine 5 MG tablet Commonly known as: ZYPREXA Take 1 tablet (5 mg total) by mouth 2 (two) times daily.   oxyCODONE  5 MG immediate release tablet Commonly known as: Oxy IR/ROXICODONE  Take 1 tablet (5 mg total) by mouth every 4 (four) hours as needed for moderate pain (pain score 4-6) (or dyspnea).   polyethylene glycol 17 g packet Commonly known as: MIRALAX  / GLYCOLAX  Take 17 g by mouth daily. Start taking on: July 16, 2024  Durable Medical Equipment  (From admission, onward)           Start     Ordered   07/11/24 1726  For home use only DME 4 wheeled rolling walker with seat  Once       Question:  Patient needs a walker to treat with the following condition  Answer:  Heart failure [facility])   07/11/24 1728              Discharge Care Instructions  (From admission, onward)           Start     Ordered   07/15/24 0000  Discharge wound care:       Comments: Continue current wound care plan   07/15/24 1023            Follow-up Information     Thedora Garnette HERO, MD Follow up in 1 week(s).   Specialty: Family Medicine Contact information: [address] [place]  [place] [address] [phone]         [facility] Follow up in 1 day(s).   Specialty: Hospice and Palliative Medicine Contact information: [address] [place] [address] [phone]                Time spent: 35 minutes.  SignedBETHA Leatrice LILLETTE Rosario 07/15/2024, 10:35 AM

## 2024-07-15 NOTE — TOC Transition Note (Signed)
 Transition of Care [facility]) - Discharge Note   Patient Details  Name: Nicolas Ray MRN: [id number] Date of Birth: 02/16/1951  Transition of Care [facility]) CM/SW Contact:  Waddell Barnie Rama, RN Phone Number: 07/15/2024, 9:05 AM   Clinical Narrative:    For dc today, family at the bedside to transport him home today.  NCM notified Inocente with [facility].      Barriers to Discharge: Continued Medical Work up, Other (must enter comment), English As A Second Language Teacher (Awaiting family decision on bed offers)   Patient Goals and CMS Choice Patient states their goals for this hospitalization and ongoing recovery are:: wants to remain independent          Discharge Placement                       Discharge Plan and Services Additional resources added to the After Visit Summary for     Discharge Planning Services: CM Consult                                 Social Drivers of Health (SDOH) Interventions SDOH Screenings   Food Insecurity: No Food Insecurity (07/08/2024)  Housing: Low Risk  (07/08/2024)  Transportation Needs: No Transportation Needs (07/08/2024)  Utilities: Not At Risk (07/08/2024)  Depression (PHQ2-9): Low Risk  (06/01/2023)  Social Connections: Moderately Isolated (07/08/2024)  Tobacco Use: High Risk (07/09/2024)     Readmission Risk Interventions     No data to display

## 2024-07-22 ENCOUNTER — Telehealth: Payer: Self-pay | Admitting: Cardiovascular Disease

## 2024-07-22 NOTE — Telephone Encounter (Signed)
 Pts sisters requesting a c/b to discuss medication changes made at the hospital and also would like to get the pt cleared to drive again.

## 2024-07-22 NOTE — Telephone Encounter (Signed)
 Returned call to patient's sister Bogden (DPR), 2 identifiers used.   Samira reports that patient was in hospital from 11/7-11/14 with vfib arrest. He was not a candidate for an ICD and was discharged with referral for hospice vs home health. Spieker says patient is up, walking around, taking out the trash and doing all his own ADL's. She says he seems mentally intact other than some aspects of his hospital stay early on that he doesn't remember. She says she has discussed hospice with her brother and he does not want hospice, he wants to pursue treatment. She states he is asking when he can drive again.   Patient has appointment with Dr. Wonda in February, offered appt with Glendia Ferrier who patient has seen before, Samira accepts. She requests a sooner appointment with Dr. Wonda if possible to discuss if patient could be a candidate for AICD or other life-extending treatment.

## 2024-07-23 NOTE — Telephone Encounter (Signed)
 He should keep 12/1 appt with Glendia Ferrier. He has a long history of severe heart disease and medication non-compliance. He would not be a candidate for an ICD. With his kidney dysfunction, severe heart failure, and other comorbid conditions, he has a very tenuous prognosis. Will continue to treat him medically from a cardiac perspective.

## 2024-07-25 NOTE — Telephone Encounter (Signed)
 Spoke with pt and explained that per Dr. Wonda it is important that we keep the 12/1 appt with Glendia Ferrier, PA-C. Pt stated he normally likes to see Dr. Wonda but he understood. Explained that because we wouldn't be able to get the pt in to see Dr. Wonda prior to 12/1, this was the soonest appt for the pt to have and Dr. Wonda wanted him to be seen sooner rather than later. Pt verbalized understanding and agreed to plan. No further questions or concerns at this time.

## 2024-08-01 ENCOUNTER — Encounter: Payer: Self-pay | Admitting: Physician Assistant

## 2024-08-01 ENCOUNTER — Ambulatory Visit: Attending: Physician Assistant | Admitting: Physician Assistant

## 2024-08-01 VITALS — BP 120/60 | HR 67 | Ht 68.0 in | Wt 194.0 lb

## 2024-08-01 DIAGNOSIS — I2510 Atherosclerotic heart disease of native coronary artery without angina pectoris: Secondary | ICD-10-CM | POA: Diagnosis not present

## 2024-08-01 DIAGNOSIS — I5020 Unspecified systolic (congestive) heart failure: Secondary | ICD-10-CM | POA: Diagnosis not present

## 2024-08-01 DIAGNOSIS — N185 Chronic kidney disease, stage 5: Secondary | ICD-10-CM | POA: Diagnosis not present

## 2024-08-01 DIAGNOSIS — I4901 Ventricular fibrillation: Secondary | ICD-10-CM

## 2024-08-01 DIAGNOSIS — I350 Nonrheumatic aortic (valve) stenosis: Secondary | ICD-10-CM

## 2024-08-01 NOTE — Progress Notes (Signed)
 OFFICE NOTE:    Date:  08/01/2024  ID:  Sheppard LILLETTE Situ, DOB 30-Dec-1950, MRN [id number] PCP: Delayne Artist PARAS, MD  [facility] Providers Cardiologist:  Ozell Fell, MD        Coronary artery disease  S/p Lat STEMI 8/16 >> s/p 3 x 38 mm DES to D1 Pt refused CABG >> staged PCI: 2.75 x 32 mm DES to mLAD and 4 x 28 mm DES to pRCA MPI 12/17/20: Lg scar burden, no ischemia, EF 22 LHC 12/09/22: LAD stent patent w mild ISR and 40% distal edge restenosis, mLAD 60, D1 stent patent w mild ISR + dist edge restenosis 60%; OM1 80, dist LCx 100 w L-R collats; RCA stent patent with mild ISR, mRCA 40, dRCA 40, RPDA 50 >> med Rx (HFrEF) heart failure with reduced ejection fraction  Ischemic CM ACEi DCd in past due to worsening Creatinine  Echocardiogram 8/16: EF 40-45 Echocardiogram 5/17: EF 35-40 TTE 01/24/2021: EF 35-40 Admx to [facility] in 12/2022 w acute CHF, EF 15 on TTE >> LHC 12/2022 w stable anatomy TTE 07/09/2024: EF 20-25, global HK, mild LVH, normal RVSF, mild BAE, LF LG moderate AS (mean gradient 6, V-max 178 cm/s, DI 0.39), Aortic Stenosis  LFLG moderate aortic stenosis by TTE in 07/2024  Diabetes mellitus  Chronic kidney disease stage V Hypertension  Medical non-adherence  Tobacco use R hemidiaphragm paralysis Mild OSA   ABIs 03/22/21: normal bilaterally   BPH s/p TURP Hx of UGI bleed in 07/2023 (s/p PRBCs)  DNR (Do Not Resuscitate)        Discussed the use of AI scribe software for clinical note transcription with the patient, who gave verbal consent to proceed. History of Present Illness Nicolas Ray is a 73 y.o. male for post hospital follow up.   He had an admission [facility] in 12/2022 for acute CHF/pulmonary edema and EF was noted to be lower at 15%. He had follow up with Dr. Fell and cardiac catheterization in 12/2022 demonstrated patency of the left main, continued patency of the LAD and diagonal stents with mild diffuse in-stent restenosis, moderate distal edge  restenosis in the LAD and diagonal. There was severe, stable stenosis in a small OM1 and total occlusion of the mid LCx with left-to-right collaterals. There was continued patency of the RCA stent with mild diffuse in-stent restenosis and mild to moderate distal RCA and proximal PDA stenoses.  Medical therapy was recommended.  He was admitted 11/7-11/14 with acute CHF in the setting of hypertensive emergency complicated by flash pulmonary edema.  He was started on IV diuresis with good initial response. He then suffered a V-fib arrest in the ED and with CPR, epi, shock x 1, he had ROSC and was placed on Amiodarone .  He had worsening creatinine, up to 5.9 from 2.83 on admission. He was not felt to be a candidate for ICD and the pt declined. He also declined hemodialysis and he was seen by palliative care for comfort care discussion. Overall, his prognosis is felt to be poor. Decision was made to DC him home with home Hospice.   He contacted our office recently requested consideration for ICD, clearance to drive and to discontinue Hospice. Pt is still not felt to be a candidate for ICD and is a candidate only for medical Rx.   He is here today with his 2 sisters. He is scheduled to see a kidney doctor this Wednesday. He has declined dialysis. He takes Lasix   occasionally, particularly when experiencing chest pressure, which he associates with fluid retention. Taking Lasix  alleviates the chest pressure. He does not weigh himself daily. He has noted swelling and darkness in his legs. He is able to perform daily activities such as making breakfast and taking out the trash. No trouble breathing when lying flat, waking up suddenly out of breath, or passing out since being home. He started smoking again.     ROS-See HPI    Studies Reviewed:  EKG Interpretation Date/Time:  Monday August 01 2024 15:44:20 EST Ventricular Rate:  67 PR Interval:  228 QRS Duration:  114 QT Interval:  396 QTC Calculation: 418 R  Axis:   69  Text Interpretation: Sinus rhythm with 1st degree A-V block Anterolateral infarct ST & T wave abnormality, consider inferolateral ischemia Not significantly different when compared to last ECG Confirmed by Lelon Hamilton [phone]) on 08/01/2024 3:58:31 PM    LABS 08/03/23: Total cholesterol 153, LDL 92, triglycerides 144, HDL 32.8 07/08/2024: TSH 2.112 07/10/2024: Creatinine 4.06, eGFR 15, ALT 23 07/13/2024: K 3.6, creatinine 5.93, eGFR 9, Hgb 13.1, PLT 160K         Physical Exam:  VS:  BP 120/60   Pulse 67   Ht 5' 8 (1.727 m)   Wt 194 lb (88 kg)   SpO2 94%   BMI 29.50 kg/m        Wt Readings from Last 3 Encounters:  08/01/24 194 lb (88 kg)  07/13/24 178 lb 12.8 oz (81.1 kg)  11/30/23 175 lb (79.4 kg)    Constitutional:      Appearance: Healthy appearance. Not in distress.  Neck:     Vascular: No JVR. JVD normal.  Pulmonary:     Breath sounds: Normal breath sounds. No wheezing. No rales.  Cardiovascular:     Normal rate. Regular rhythm.     Murmurs: There is no murmur.  Edema:    Peripheral edema present.    Pretibial: bilateral 1+ edema of the pretibial area.    Ankle: bilateral 2+ edema of the ankle. Abdominal:     Palpations: Abdomen is soft.       Assessment and Plan:    Assessment & Plan HFrEF (heart failure with reduced ejection fraction) (HCC) EF 20-25. Ischemic CM. He was recently admitted for acute CHF/acute pulmonary edema in the setting of HTN emergency. His course was c/b VF arrest. His renal function worsened to stage V. He declined hemodialysis and ICD. He was not felt to be a candidate for ICD. He was ultimately seen be Palliative Medicine, made DNR (Do Not Resuscitate) and and discharged with home Hospice. He has a long hx of non-adherence to medical Rx and has not tolerated several medications used for GDMT. I considered whether to start him on Hydralazine , but on chart review, he stopped this in the past due to side effects. He is not currently  a candidate for ACE/ARB/ARNI, MRA, SGLT2i due to advanced chronic kidney disease. He likely needs a regular dose of Furosemide  instead of taking it as needed. [facility] is still coming to his house.  - I advised him to weigh daily and take Furosemide  if his wt increases 3 lbs or more in 1 day - Continue Metoprolol  succinate 25 mg once daily - BMET today - Depending upon current renal function, will consider changing Furosemide  to three times a week vs once daily. - Keep follow up with Dr. Wonda in Feb as planned VF (ventricular fibrillation) [facility]) He had VF arrest  while in the hospital last month for acute CHF. He had ROSC with CPR, Epi and shock x 1 and was DC on Amiodarone . He is not a candidate for ICD. He wants to drive. I have explicitly told him that he cannot drive. I reiterated this to him several times today. - Patient should not drive - Continue Amiodarone  400 mg once daily - Consider changing Amiodarone  to 200 mg once daily at next OV in Feb 2026.  - Consider LFTs, TSH at next OV in Feb 2026  Coronary artery disease involving native coronary artery of native heart without angina pectoris S/p lateral STEMI in 8/16 tx with DES to the D1 and subsequent multivessel PCI with DES to the LAD and RCA after patient refused CABG. Cardiac catheterization in 12/2022 showed patent D1, LAD and RCA stents. There was severe disease in an OM1 and dLCx was chronically occluded. Med Rx was continued. He is not a candidate for invasive evaluation. He notes some chest pain that relieves with Furosemide . This is likely related to volume excess. - Continue Clopidogrel  75 mg once daily CKD (chronic kidney disease) stage 5, GFR less than 15 ml/min (HCC) His renal function significantly worsened during his hospital stay for acute CHF c/b VF arrest. He has an appt with Nephrology ([facility]) later this week.  Nonrheumatic aortic (valve) stenosis TTE during his recent hospitalization demonstrated low flow low gradient  moderate aortic stenosis. He is not a candidate for valve intervention.          Dispo:  Return in 3 months (on 10/28/2024) for Scheduled Follow Up, w/ Dr. Wonda.  Signed, Glendia Ferrier, PA-C

## 2024-08-01 NOTE — Assessment & Plan Note (Addendum)
 EF 20-25. Ischemic CM. He was recently admitted for acute CHF/acute pulmonary edema in the setting of HTN emergency. His course was c/b VF arrest. His renal function worsened to stage V. He declined hemodialysis and ICD. He was not felt to be a candidate for ICD. He was ultimately seen be Palliative Medicine, made DNR (Do Not Resuscitate) and and discharged with home Hospice. He has a long hx of non-adherence to medical Rx and has not tolerated several medications used for GDMT. I considered whether to start him on Hydralazine , but on chart review, he stopped this in the past due to side effects. He is not currently a candidate for ACE/ARB/ARNI, MRA, SGLT2i due to advanced chronic kidney disease. He likely needs a regular dose of Furosemide  instead of taking it as needed. [facility] is still coming to his house.  - I advised him to weigh daily and take Furosemide  if his wt increases 3 lbs or more in 1 day - Continue Metoprolol  succinate 25 mg once daily - BMET today - Depending upon current renal function, will consider changing Furosemide  to three times a week vs once daily. - Keep follow up with Dr. Wonda in Feb as planned

## 2024-08-01 NOTE — Patient Instructions (Signed)
 Medication Instructions:  Your physician recommends that you continue on your current medications as directed. Please refer to the Current Medication list given to you today.  *If you need a refill on your cardiac medications before your next appointment, please call your pharmacy*  Lab Work: BMET today  Testing/Procedures: NONE ordered at this time of appointment   Follow-Up: At [facility], you and your health needs are our priority.  As part of our continuing mission to provide you with exceptional heart care, our providers are all part of one team.  This team includes your primary Cardiologist (physician) and Advanced Practice Providers or APPs (Physician Assistants and Nurse Practitioners) who all work together to provide you with the care you need, when you need it.  Your next appointment:    Keep appointment with Dr. Wonda    We recommend signing up for the patient portal called MyChart.  Sign up information is provided on this After Visit Summary.  MyChart is used to connect with patients for Virtual Visits (Telemedicine).  Patients are able to view lab/test results, encounter notes, upcoming appointments, etc.  Non-urgent messages can be sent to your provider as well.   To learn more about what you can do with MyChart, go to [email or web address].   Other Instructions

## 2024-08-01 NOTE — Assessment & Plan Note (Addendum)
 He had VF arrest while in the hospital last month for acute CHF. He had ROSC with CPR, Epi and shock x 1 and was DC on Amiodarone . He is not a candidate for ICD. He wants to drive. I have explicitly told him that he cannot drive. I reiterated this to him several times today. - Patient should not drive - Continue Amiodarone  400 mg once daily - Consider changing Amiodarone  to 200 mg once daily at next OV in Feb 2026.  - Consider LFTs, TSH at next OV in Feb 2026

## 2024-08-02 ENCOUNTER — Ambulatory Visit: Payer: Self-pay | Admitting: Physician Assistant

## 2024-08-02 DIAGNOSIS — N185 Chronic kidney disease, stage 5: Secondary | ICD-10-CM

## 2024-08-02 DIAGNOSIS — I5020 Unspecified systolic (congestive) heart failure: Secondary | ICD-10-CM

## 2024-08-02 LAB — BASIC METABOLIC PANEL WITH GFR
BUN/Creatinine Ratio: 14 (ref 10–24)
BUN: 54 mg/dL — ABNORMAL HIGH (ref 8–27)
CO2: 20 mmol/L (ref 20–29)
Calcium: 8.8 mg/dL (ref 8.6–10.2)
Chloride: 98 mmol/L (ref 96–106)
Creatinine, Ser: 3.86 mg/dL — ABNORMAL HIGH (ref 0.76–1.27)
Glucose: 97 mg/dL (ref 70–99)
Potassium: 4.9 mmol/L (ref 3.5–5.2)
Sodium: 133 mmol/L — ABNORMAL LOW (ref 134–144)
eGFR: 16 mL/min/1.73 — ABNORMAL LOW (ref >59)

## 2024-08-04 ENCOUNTER — Other Ambulatory Visit: Payer: Self-pay | Admitting: Nephrology

## 2024-08-04 DIAGNOSIS — N184 Chronic kidney disease, stage 4 (severe): Secondary | ICD-10-CM

## 2024-08-11 ENCOUNTER — Other Ambulatory Visit: Payer: Self-pay | Admitting: Internal Medicine

## 2024-08-15 ENCOUNTER — Inpatient Hospital Stay: Admission: RE | Admit: 2024-08-15 | Discharge: 2024-08-15 | Attending: Nephrology | Admitting: Nephrology

## 2024-08-15 DIAGNOSIS — N184 Chronic kidney disease, stage 4 (severe): Secondary | ICD-10-CM

## 2024-08-22 NOTE — Progress Notes (Signed)
 Left vm for patient to call in to discuss coming in for lab draw. Order has already been placed.

## 2024-10-28 ENCOUNTER — Ambulatory Visit: Admitting: Cardiovascular Disease
